# Patient Record
Sex: Female | Born: 1935
Health system: Southern US, Community
[De-identification: ages and names within clinical notes are randomized; demographics above are authoritative.]

## PROBLEM LIST (undated history)

## (undated) DIAGNOSIS — F32A Depression, unspecified: Secondary | ICD-10-CM

## (undated) DIAGNOSIS — M1990 Unspecified osteoarthritis, unspecified site: Secondary | ICD-10-CM

## (undated) DIAGNOSIS — F329 Major depressive disorder, single episode, unspecified: Secondary | ICD-10-CM

## (undated) DIAGNOSIS — J309 Allergic rhinitis, unspecified: Secondary | ICD-10-CM

## (undated) DIAGNOSIS — E785 Hyperlipidemia, unspecified: Secondary | ICD-10-CM

## (undated) DIAGNOSIS — N6019 Diffuse cystic mastopathy of unspecified breast: Secondary | ICD-10-CM

## (undated) DIAGNOSIS — S066X9A Traumatic subarachnoid hemorrhage with loss of consciousness of unspecified duration, initial encounter: Secondary | ICD-10-CM

## (undated) DIAGNOSIS — F0390 Unspecified dementia without behavioral disturbance: Secondary | ICD-10-CM

## (undated) DIAGNOSIS — I639 Cerebral infarction, unspecified: Secondary | ICD-10-CM

## (undated) DIAGNOSIS — E039 Hypothyroidism, unspecified: Secondary | ICD-10-CM

## (undated) DIAGNOSIS — R7301 Impaired fasting glucose: Secondary | ICD-10-CM

## (undated) DIAGNOSIS — S065X9A Traumatic subdural hemorrhage with loss of consciousness of unspecified duration, initial encounter: Secondary | ICD-10-CM

## (undated) DIAGNOSIS — I10 Essential (primary) hypertension: Secondary | ICD-10-CM

## (undated) DIAGNOSIS — F419 Anxiety disorder, unspecified: Secondary | ICD-10-CM

## (undated) DIAGNOSIS — K449 Diaphragmatic hernia without obstruction or gangrene: Secondary | ICD-10-CM

## (undated) HISTORY — DX: Anxiety disorder, unspecified: F41.9

## (undated) HISTORY — DX: Major depressive disorder, single episode, unspecified: F32.9

## (undated) HISTORY — DX: Unspecified osteoarthritis, unspecified site: M19.90

## (undated) HISTORY — PX: TONSILLECTOMY: SUR1361

## (undated) HISTORY — DX: Depression, unspecified: F32.A

## (undated) HISTORY — DX: Hyperlipidemia, unspecified: E78.5

## (undated) HISTORY — PX: BREAST SURGERY: SHX581

## (undated) HISTORY — DX: Cerebral infarction, unspecified: I63.9

## (undated) HISTORY — DX: Allergic rhinitis, unspecified: J30.9

## (undated) HISTORY — DX: Diffuse cystic mastopathy of unspecified breast: N60.19

## (undated) HISTORY — PX: ABDOMINAL HYSTERECTOMY: SHX81

## (undated) HISTORY — PX: EYE SURGERY: SHX253

## (undated) HISTORY — DX: Diaphragmatic hernia without obstruction or gangrene: K44.9

## (undated) HISTORY — DX: Impaired fasting glucose: R73.01

---

## 1997-06-12 ENCOUNTER — Other Ambulatory Visit: Admission: RE | Admit: 1997-06-12 | Discharge: 1997-06-12 | Payer: Self-pay | Admitting: Obstetrics and Gynecology

## 1998-11-27 ENCOUNTER — Ambulatory Visit (HOSPITAL_COMMUNITY): Admission: RE | Admit: 1998-11-27 | Discharge: 1998-11-27 | Payer: Self-pay | Admitting: *Deleted

## 1999-09-30 ENCOUNTER — Encounter: Admission: RE | Admit: 1999-09-30 | Discharge: 1999-09-30 | Payer: Self-pay | Admitting: Obstetrics and Gynecology

## 1999-09-30 ENCOUNTER — Encounter: Payer: Self-pay | Admitting: Obstetrics and Gynecology

## 2000-10-26 ENCOUNTER — Encounter: Admission: RE | Admit: 2000-10-26 | Discharge: 2000-10-26 | Payer: Self-pay | Admitting: Obstetrics and Gynecology

## 2000-10-26 ENCOUNTER — Encounter: Payer: Self-pay | Admitting: Obstetrics and Gynecology

## 2001-10-27 ENCOUNTER — Encounter: Payer: Self-pay | Admitting: Obstetrics and Gynecology

## 2001-10-27 ENCOUNTER — Encounter: Admission: RE | Admit: 2001-10-27 | Discharge: 2001-10-27 | Payer: Self-pay | Admitting: Obstetrics and Gynecology

## 2002-07-27 ENCOUNTER — Encounter: Payer: Self-pay | Admitting: Family Medicine

## 2002-07-27 ENCOUNTER — Ambulatory Visit (HOSPITAL_COMMUNITY): Admission: RE | Admit: 2002-07-27 | Discharge: 2002-07-27 | Payer: Self-pay | Admitting: Family Medicine

## 2002-10-31 ENCOUNTER — Encounter: Admission: RE | Admit: 2002-10-31 | Discharge: 2002-10-31 | Payer: Self-pay | Admitting: Obstetrics and Gynecology

## 2002-10-31 ENCOUNTER — Encounter: Payer: Self-pay | Admitting: Obstetrics and Gynecology

## 2003-11-01 ENCOUNTER — Ambulatory Visit (HOSPITAL_COMMUNITY): Admission: RE | Admit: 2003-11-01 | Discharge: 2003-11-01 | Payer: Self-pay | Admitting: Obstetrics and Gynecology

## 2004-10-08 ENCOUNTER — Ambulatory Visit (HOSPITAL_COMMUNITY): Admission: RE | Admit: 2004-10-08 | Discharge: 2004-10-08 | Payer: Self-pay | Admitting: Family Medicine

## 2004-11-12 ENCOUNTER — Ambulatory Visit (HOSPITAL_COMMUNITY): Admission: RE | Admit: 2004-11-12 | Discharge: 2004-11-12 | Payer: Self-pay | Admitting: Obstetrics and Gynecology

## 2005-11-19 ENCOUNTER — Ambulatory Visit (HOSPITAL_COMMUNITY): Admission: RE | Admit: 2005-11-19 | Discharge: 2005-11-19 | Payer: Self-pay | Admitting: Family Medicine

## 2005-12-18 ENCOUNTER — Ambulatory Visit (HOSPITAL_COMMUNITY): Admission: RE | Admit: 2005-12-18 | Discharge: 2005-12-18 | Payer: Self-pay | Admitting: Family Medicine

## 2006-12-28 ENCOUNTER — Ambulatory Visit (HOSPITAL_COMMUNITY): Admission: RE | Admit: 2006-12-28 | Discharge: 2006-12-28 | Payer: Self-pay | Admitting: Family Medicine

## 2007-12-20 ENCOUNTER — Ambulatory Visit (HOSPITAL_COMMUNITY): Admission: RE | Admit: 2007-12-20 | Discharge: 2007-12-20 | Payer: Self-pay | Admitting: Family Medicine

## 2009-02-05 ENCOUNTER — Ambulatory Visit (HOSPITAL_COMMUNITY): Admission: RE | Admit: 2009-02-05 | Discharge: 2009-02-05 | Payer: Self-pay | Admitting: Family Medicine

## 2009-07-08 ENCOUNTER — Ambulatory Visit (HOSPITAL_COMMUNITY): Admission: RE | Admit: 2009-07-08 | Discharge: 2009-07-08 | Payer: Self-pay | Admitting: Family Medicine

## 2009-07-14 ENCOUNTER — Emergency Department (HOSPITAL_COMMUNITY): Admission: EM | Admit: 2009-07-14 | Discharge: 2009-07-14 | Payer: Self-pay | Admitting: Emergency Medicine

## 2009-07-15 ENCOUNTER — Ambulatory Visit (HOSPITAL_COMMUNITY): Admission: RE | Admit: 2009-07-15 | Discharge: 2009-07-15 | Payer: Self-pay | Admitting: Family Medicine

## 2009-09-12 ENCOUNTER — Encounter (HOSPITAL_COMMUNITY): Admission: RE | Admit: 2009-09-12 | Discharge: 2009-10-12 | Payer: Self-pay | Admitting: Diagnostic Neuroimaging

## 2009-09-23 ENCOUNTER — Ambulatory Visit (HOSPITAL_COMMUNITY): Admission: RE | Admit: 2009-09-23 | Discharge: 2009-09-23 | Payer: Self-pay | Admitting: Diagnostic Neuroimaging

## 2009-10-15 ENCOUNTER — Encounter (HOSPITAL_COMMUNITY): Admission: RE | Admit: 2009-10-15 | Discharge: 2009-11-14 | Payer: Self-pay | Admitting: Diagnostic Neuroimaging

## 2010-01-02 ENCOUNTER — Ambulatory Visit: Payer: Self-pay | Admitting: Internal Medicine

## 2010-04-08 ENCOUNTER — Other Ambulatory Visit (HOSPITAL_COMMUNITY): Payer: Self-pay | Admitting: Family Medicine

## 2010-04-08 DIAGNOSIS — Z139 Encounter for screening, unspecified: Secondary | ICD-10-CM

## 2010-04-14 ENCOUNTER — Ambulatory Visit (HOSPITAL_COMMUNITY)
Admission: RE | Admit: 2010-04-14 | Discharge: 2010-04-14 | Disposition: A | Discharge status: Home / Self Care | Payer: MEDICARE | Source: Ambulatory Visit | Attending: Family Medicine | Admitting: Family Medicine

## 2010-04-14 DIAGNOSIS — Z139 Encounter for screening, unspecified: Secondary | ICD-10-CM

## 2010-04-14 DIAGNOSIS — Z1231 Encounter for screening mammogram for malignant neoplasm of breast: Secondary | ICD-10-CM | POA: Insufficient documentation

## 2010-07-01 ENCOUNTER — Ambulatory Visit (HOSPITAL_COMMUNITY): Admission: RE | Admit: 2010-07-01 | Payer: Self-pay | Source: Home / Self Care | Admitting: Family Medicine

## 2010-12-25 ENCOUNTER — Emergency Department (HOSPITAL_COMMUNITY): Payer: No Typology Code available for payment source

## 2010-12-25 ENCOUNTER — Emergency Department (HOSPITAL_COMMUNITY)
Admission: EM | Admit: 2010-12-25 | Discharge: 2010-12-25 | Disposition: A | Discharge status: Home / Self Care | Payer: No Typology Code available for payment source | Attending: Emergency Medicine | Admitting: Emergency Medicine

## 2010-12-25 ENCOUNTER — Encounter: Payer: Self-pay | Admitting: *Deleted

## 2010-12-25 DIAGNOSIS — R51 Headache: Secondary | ICD-10-CM | POA: Insufficient documentation

## 2010-12-25 DIAGNOSIS — S0003XA Contusion of scalp, initial encounter: Secondary | ICD-10-CM | POA: Insufficient documentation

## 2010-12-25 DIAGNOSIS — W19XXXA Unspecified fall, initial encounter: Secondary | ICD-10-CM | POA: Insufficient documentation

## 2010-12-25 DIAGNOSIS — S0990XA Unspecified injury of head, initial encounter: Secondary | ICD-10-CM | POA: Insufficient documentation

## 2010-12-25 DIAGNOSIS — S0083XA Contusion of other part of head, initial encounter: Secondary | ICD-10-CM

## 2010-12-25 DIAGNOSIS — M549 Dorsalgia, unspecified: Secondary | ICD-10-CM | POA: Insufficient documentation

## 2010-12-25 HISTORY — DX: Essential (primary) hypertension: I10

## 2010-12-25 MED ORDER — TETANUS-DIPHTH-ACELL PERTUSSIS 5-2.5-18.5 LF-MCG/0.5 IM SUSP
0.5000 mL | Freq: Once | INTRAMUSCULAR | Status: AC
  Administered 2010-12-25: 0.5 mL via INTRAMUSCULAR
  Filled 2010-12-25: qty 0.5

## 2010-12-25 NOTE — ED Provider Notes (Signed)
History     CSN: [id number] Arrival date & time: 12/25/2010  3:22 PM   First MD Initiated Contact with Patient 12/25/10 1526      Chief Complaint  Patient presents with  . Head Injury    (Consider location/radiation/quality/duration/timing/severity/associated sxs/prior treatment) Patient is a 77 y.o. female presenting with head injury. The history is provided by the patient.  Head Injury  The incident occurred less than 1 hour ago. The injury mechanism was a fall. There was no loss of consciousness. The volume of blood lost was minimal. The quality of the pain is described as dull. The pain is mild. The pain has been constant since the injury. Pertinent negatives include no numbness, no vomiting and no weakness. She has tried nothing for the symptoms.   patient states that she was sitting on a bench outside [name] and one of the sides gave out and she fell and hit her head on the concrete ground no loss of consciousness. She just has pain at the site of the swelling. She's a hematoma on her right for him. She has no other complaints, besides her chronic back pain. She's on no blood thinners. She has no numbness or weakness. No dizziness. No vision changes.  Past Medical History  Diagnosis Date  . Hypertension     Past Surgical History  Procedure Date  . Abdominal hysterectomy   . Breast surgery   . Tonsillectomy   . Eye surgery     History reviewed. No pertinent family history.  History  Substance Use Topics  . Smoking status: Never Smoker   . Smokeless tobacco: Not on file  . Alcohol Use: No    OB History    Grav Para Term Preterm Abortions TAB SAB Ect Mult Living                  Review of Systems  Constitutional: Negative for activity change and appetite change.  HENT: Negative for neck stiffness.   Eyes: Negative for pain.  Respiratory: Negative for chest tightness and shortness of breath.   Cardiovascular: Negative for chest pain and leg swelling.    Gastrointestinal: Negative for nausea, vomiting, abdominal pain and diarrhea.  Genitourinary: Negative for flank pain.  Musculoskeletal: Positive for back pain.  Skin: Positive for wound. Negative for rash.  Neurological: Positive for headaches. Negative for weakness and numbness.  Psychiatric/Behavioral: Negative for behavioral problems.    Allergies  Penicillins and Sulfa antibiotics  Home Medications  No current outpatient prescriptions on file.  BP 158/66  Pulse 72  Temp(Src) 98.3 F (36.8 C) (Oral)  Resp 18  SpO2 94%  Physical Exam  Nursing note and vitals reviewed. Constitutional: She is oriented to person, place, and time. She appears well-developed and well-nourished.  HENT:  Head: Normocephalic.        Patient has a 3 cm raised hematoma on her right for head above her right orbital rim. There is an abrasion. No deformity or tenderness in the bone.  Eyes: EOM are normal. Pupils are equal, round, and reactive to light.  Neck: Normal range of motion. Neck supple.  Cardiovascular: Normal rate, regular rhythm and normal heart sounds.   No murmur heard. Pulmonary/Chest: Effort normal and breath sounds normal. No respiratory distress. She has no wheezes. She has no rales.  Abdominal: Soft. Bowel sounds are normal. She exhibits no distension. There is no tenderness. There is no rebound and no guarding.  Musculoskeletal: Normal range of motion.  Neurological: She is  alert and oriented to person, place, and time. No cranial nerve deficit.  Skin: Skin is warm and dry.  Psychiatric: She has a normal mood and affect. Her speech is normal.    ED Course  Procedures (including critical care time)  Labs Reviewed - No data to display Ct Head Wo Contrast  12/25/2010  *RADIOLOGY REPORT*  Clinical Data: 77 year old female status post fall.  Hematoma.  CT HEAD WITHOUT CONTRAST  Technique:  Contiguous axial images were obtained from the base of the skull through the vertex without  contrast.  Comparison: Brain MRI 09/23/2009.  Head CT 07/14/2009.  Findings: Right lateral and supraorbital scalp hematoma measures up to 16 mm in thickness.  Underlying right frontal bone is intact. Right orbital roof is stable and intact. Visualized orbit soft tissues are within normal limits.  No acute osseous abnormality identified.  Visualized paranasal sinuses and mastoids are clear.  No ventriculomegaly. No midline shift, mass effect, or evidence of mass lesion.  No acute intracranial hemorrhage identified.  Patchy white matter hypodensity not significantly changed. No evidence of cortically based acute infarction identified.  No suspicious intracranial vascular hyperdensity.  IMPRESSION: 1.  Right forehead scalp hematoma without underlying fracture. 2.  Stable noncontrast CT appearance of the brain.  Original Report Authenticated By: Ulla Potash III, M.D.     1. Minor head injury   2. Traumatic hematoma of forehead       MDM  Fall with forehead hematoma. A loss of consciousness. CT negative for intracranial injury. Patient has no other injury apparent. She'll be discharged home she is not on blood thinners.        Juliet Rude. Rubin Payor, MD 12/25/10 1733

## 2010-12-25 NOTE — ED Notes (Signed)
Dr Rubin Payor at bedside  Examined pt and removed from c-collar and back board.  Pt denies pain other than in rt forehead.

## 2010-12-25 NOTE — ED Notes (Signed)
Pt states Was sitting outside on bench at [employer] and the bench broke causing the pt to be "thrown from the bench hitting her head on the concrete ground.

## 2011-05-15 ENCOUNTER — Encounter (HOSPITAL_COMMUNITY): Payer: Self-pay

## 2011-05-15 ENCOUNTER — Emergency Department (HOSPITAL_COMMUNITY): Payer: Medicare Other

## 2011-05-15 ENCOUNTER — Inpatient Hospital Stay (HOSPITAL_COMMUNITY)
Admission: EM | Admit: 2011-05-15 | Discharge: 2011-05-17 | DRG: 087 | Disposition: A | Discharge status: Home / Self Care | Payer: Medicare Other | Attending: General Surgery | Admitting: General Surgery

## 2011-05-15 DIAGNOSIS — S066X9A Traumatic subarachnoid hemorrhage with loss of consciousness of unspecified duration, initial encounter: Secondary | ICD-10-CM | POA: Diagnosis present

## 2011-05-15 DIAGNOSIS — W19XXXA Unspecified fall, initial encounter: Secondary | ICD-10-CM

## 2011-05-15 DIAGNOSIS — W1809XA Striking against other object with subsequent fall, initial encounter: Secondary | ICD-10-CM | POA: Diagnosis present

## 2011-05-15 DIAGNOSIS — S0101XA Laceration without foreign body of scalp, initial encounter: Secondary | ICD-10-CM

## 2011-05-15 DIAGNOSIS — I10 Essential (primary) hypertension: Secondary | ICD-10-CM | POA: Diagnosis present

## 2011-05-15 DIAGNOSIS — I609 Nontraumatic subarachnoid hemorrhage, unspecified: Secondary | ICD-10-CM

## 2011-05-15 DIAGNOSIS — Z79899 Other long term (current) drug therapy: Secondary | ICD-10-CM

## 2011-05-15 DIAGNOSIS — S066X0A Traumatic subarachnoid hemorrhage without loss of consciousness, initial encounter: Principal | ICD-10-CM | POA: Diagnosis present

## 2011-05-15 DIAGNOSIS — Y9289 Other specified places as the place of occurrence of the external cause: Secondary | ICD-10-CM

## 2011-05-15 DIAGNOSIS — S065X9A Traumatic subdural hemorrhage with loss of consciousness of unspecified duration, initial encounter: Secondary | ICD-10-CM

## 2011-05-15 DIAGNOSIS — S0100XA Unspecified open wound of scalp, initial encounter: Secondary | ICD-10-CM | POA: Diagnosis present

## 2011-05-15 DIAGNOSIS — W010XXA Fall on same level from slipping, tripping and stumbling without subsequent striking against object, initial encounter: Secondary | ICD-10-CM | POA: Diagnosis present

## 2011-05-15 DIAGNOSIS — S065X0A Traumatic subdural hemorrhage without loss of consciousness, initial encounter: Secondary | ICD-10-CM | POA: Diagnosis present

## 2011-05-15 DIAGNOSIS — F411 Generalized anxiety disorder: Secondary | ICD-10-CM | POA: Diagnosis present

## 2011-05-15 DIAGNOSIS — Y9301 Activity, walking, marching and hiking: Secondary | ICD-10-CM

## 2011-05-15 LAB — POCT I-STAT, CHEM 8
BUN: 17 mg/dL (ref 6–23)
Chloride: 96 mEq/L (ref 96–112)
Creatinine, Ser: 0.9 mg/dL (ref 0.50–1.10)
Glucose, Bld: 128 mg/dL — ABNORMAL HIGH (ref 70–99)
Potassium: 3.1 mEq/L — ABNORMAL LOW (ref 3.5–5.1)
Sodium: 133 mEq/L — ABNORMAL LOW (ref 135–145)

## 2011-05-15 LAB — POCT I-STAT TROPONIN I

## 2011-05-15 LAB — PROTIME-INR
INR: 0.89 (ref 0.00–1.49)
Prothrombin Time: 12.2 seconds (ref 11.6–15.2)

## 2011-05-15 MED ORDER — SODIUM CHLORIDE 0.9 % IV SOLN: INTRAVENOUS | Status: DC

## 2011-05-15 NOTE — ED Notes (Signed)
Pt was at [employer] walking to her car, got dizzy and fell backwards hitting her head on the ground.  Unknown if loc, pt has hematoma and lac to back of head with dressing in place per ems.   Pt denies pain of any kind, is fully immob per ems d/t fall

## 2011-05-15 NOTE — ED Provider Notes (Signed)
History     CSN: [id number]  Arrival date & time 05/15/11  5784   First MD Initiated Contact with Patient 05/15/11 2005      Chief Complaint  Patient presents with  . Fall     HPI Pt was seen at 2000.  Per pt, c/o sudden onset and resolution of one episode of slip and fall PTA.  Pt states she "just ran into the store quick to get some milk" and did not take her walker to walk as she is supposed to.  Pt states she was almost back to her car when she slipped and fell backwards, hitting her head on the ground.  Denies LOC.  No AMS since fall.  Denies prodromal symptoms before fall.  Denies CP/SOB, no abd pain, no neck or back pain, no N/V/D, no visual changes, no focal motor weakness, no tingling/numbness in extremities.    Past Medical History  Diagnosis Date  . Hypertension     Past Surgical History  Procedure Date  . Abdominal hysterectomy   . Breast surgery   . Tonsillectomy   . Eye surgery     History  Substance Use Topics  . Smoking status: Never Smoker   . Smokeless tobacco: Not on file  . Alcohol Use: No    Review of Systems ROS: Statement: All systems negative except as marked or noted in the HPI; Constitutional: Negative for fever and chills. ; ; Eyes: Negative for eye pain, redness and discharge. ; ; ENMT: Negative for ear pain, hoarseness, nasal congestion, sinus pressure and sore throat. ; ; Cardiovascular: Negative for chest pain, palpitations, diaphoresis, dyspnea and peripheral edema. ; ; Respiratory: Negative for cough, wheezing and stridor. ; ; Gastrointestinal: Negative for nausea, vomiting, diarrhea, abdominal pain, blood in stool, hematemesis, jaundice and rectal bleeding. . ; ; Genitourinary: Negative for dysuria, flank pain and hematuria. ; ; Musculoskeletal: Negative for back pain and neck pain. Negative for swelling and trauma.; ; Skin: Negative for pruritus, rash, abrasions, blisters, bruising and skin lesion.; ; Neuro: Negative for headache,  lightheadedness and neck stiffness. Negative for weakness, altered level of consciousness , altered mental status, extremity weakness, paresthesias, involuntary movement, seizure and syncope.     Allergies  Penicillins and Sulfa antibiotics  Home Medications   Current Outpatient Rx  Name Route Sig Dispense Refill  . ACETAMINOPHEN 500 MG PO TABS Oral Take 1,000 mg by mouth as needed. For pain    . ALPRAZOLAM 0.5 MG PO TABS Oral Take 0.5 mg by mouth 2 (two) times daily as needed. For anxiety     . METOPROLOL SUCCINATE ER 100 MG PO TB24 Oral Take 100 mg by mouth every morning.      . ADULT MULTIVITAMIN W/MINERALS CH Oral Take 1 tablet by mouth every morning.    Marland Kitchen NORTRIPTYLINE HCL 50 MG PO CAPS Oral Take 50 mg by mouth at bedtime.      . SERTRALINE HCL 100 MG PO TABS Oral Take 100 mg by mouth every morning.        BP 173/80  Pulse 73  Temp(Src) 98.4 F (36.9 C) (Oral)  Resp 18  SpO2 98%  Physical Exam 2005: Physical examination: Vital signs and O2 SAT: Reviewed; Constitutional: Well developed, Well nourished, Well hydrated, In no acute distress; Head and Face: Normocephalic, +1cm linear lac with surrounding hematoma and abrasion to right posterior parietal/occipital scalp; Eyes: EOMI, PERRL, No scleral icterus; ENMT: Mouth and pharynx normal, Mucous membranes moist; Neck: Immobilized in C-collar,  Trachea midline; Spine: Immobilized on spineboard, No midline CS, TS, LS tenderness.; Cardiovascular: Regular rate and rhythm, No murmur, rub, or gallop; Respiratory: Breath sounds clear & equal bilaterally, No rales, rhonchi, wheezes, or rub, Normal respiratory effort/excursion; Chest: Nontender, No deformity, Movement normal, No crepitus; Abdomen: Soft, Nontender, Nondistended, Normal bowel sounds; Genitourinary: No CVA tenderness; Extremities: No deformity, Full range of motion, Neurovascularly intact, Pulses normal, No tenderness, No edema, Pelvis stable; Neuro: AA&Ox3, GCS 15.  Major CN grossly  intact. Speech clear, no facial droop. No gross focal motor or sensory deficits in extremities.; Skin: Color normal, Warm, Dry, no rash.    ED Course  Procedures   LACERATION REPAIR Performed by: Laray Anger Authorized by: Laray Anger Consent: Verbal consent obtained. Risks and benefits: risks, benefits and alternatives were discussed Consent given by: patient Patient identity confirmed: provided demographic data Prepped and Draped in normal sterile fashion Wound explored Laceration Location: right posterior parietal scalp Laceration Length: 1cm No Foreign Bodies seen or palpated Anesthesia: none Irrigation method: NS/4x4 Amount of cleaning: standard Skin closure: 2 staples Patient tolerance: Patient tolerated the procedure well with no immediate complications.    MDM  MDM Reviewed: nursing note and vitals Interpretation: CT scan Consults: trauma   Ct Head Wo Contrast 05/15/2011  *RADIOLOGY REPORT*  Clinical Data:  Status post fall; hit back of head on pavement. Altered level of consciousness.  Concern for neck injury.  CT HEAD WITHOUT CONTRAST AND CT CERVICAL SPINE WITHOUT CONTRAST  Technique:  Multidetector CT imaging of the head and cervical spine was performed following the standard protocol without intravenous contrast.  Multiplanar CT image reconstructions of the cervical spine were also generated.  Comparison: CT of the head performed 12/25/2010, and MRI of the brain performed 09/23/2009  CT HEAD  Findings:  There is suspicion of a small amount of subarachnoid blood along the right temporal lobe.  In addition, there appears to be a small right-sided subdural bleed overlying the right temporal lobe adjacent to the suspected subarachnoid hemorrhage, measuring 4 mm in maximal thickness.  There is no additional evidence for traumatic intracranial injury.  No mass lesions are identified; there is no evidence of acute infarct.  Prominence of the ventricles and sulci  reflects mild cortical volume loss.  Periventricular and subcortical white matter change likely reflects small vessel ischemic microangiopathy.  Mild cerebellar atrophy is noted.  The brainstem and fourth ventricle are within normal limits.  The basal ganglia are unremarkable in appearance.  The cerebral hemispheres demonstrate grossly normal gray-white differentiation. No mass effect or midline shift is seen.  There is no evidence of fracture; visualized osseous structures are unremarkable in appearance.  The visualized portions of the orbits are within normal limits.  The paranasal sinuses and mastoid air cells are well-aerated.  A prominent scalp hematoma and laceration are noted overlying the high right parietal calvarium, with associated soft tissue air.  IMPRESSION:  1.  Small 4 mm right-sided subdural bleed overlying the right temporal lobe. 2.  Suspect small amount of subarachnoid blood along the right temporal lobe. 3.  No additional evidence for traumatic intracranial injury; no evidence of fracture. 4.  Prominent scalp hematoma and laceration overlying the high right parietal calvarium, with associated soft tissue air. 5.  Mild cortical volume loss and scattered small vessel ischemic microangiopathy.  CT CERVICAL SPINE  Findings: There is no evidence of acute fracture or subluxation. There is grade 1 anterolisthesis of C4 on C5, and multilevel disc space narrowing along the  cervical spine, with anterior and posterior disc complexes.  Degenerative change is noted about the dens.  Facet disease is noted along the cervical spine, with fusion of the posterior elements of C3 and C4.  Mild kyphosis is noted at the lower cervical spine.  Vertebral bodies demonstrate normal height.  Prevertebral soft tissues are within normal limits.  The thyroid gland is unremarkable in appearance.  The visualized lung apices are clear.  No significant soft tissue abnormalities are seen.  IMPRESSION:  1.  No evidence of acute  fracture or subluxation along the cervical spine. 2.  Degenerative change noted along the cervical spine, with mild kyphosis seen.  Critical Value/emergent results were called by telephone at the time of interpretation on 05/15/2011  at 09:27 p.m.  to  Dr. Samuel Jester, who verbally acknowledged these results.  Original Report Authenticated By: Tonia Ghent, M.D.   Ct Cervical Spine Wo Contrast 05/15/2011  *RADIOLOGY REPORT*  Clinical Data:  Status post fall; hit back of head on pavement. Altered level of consciousness.  Concern for neck injury.  CT HEAD WITHOUT CONTRAST AND CT CERVICAL SPINE WITHOUT CONTRAST  Technique:  Multidetector CT imaging of the head and cervical spine was performed following the standard protocol without intravenous contrast.  Multiplanar CT image reconstructions of the cervical spine were also generated.  Comparison: CT of the head performed 12/25/2010, and MRI of the brain performed 09/23/2009  CT HEAD  Findings:  There is suspicion of a small amount of subarachnoid blood along the right temporal lobe.  In addition, there appears to be a small right-sided subdural bleed overlying the right temporal lobe adjacent to the suspected subarachnoid hemorrhage, measuring 4 mm in maximal thickness.  There is no additional evidence for traumatic intracranial injury.  No mass lesions are identified; there is no evidence of acute infarct.  Prominence of the ventricles and sulci reflects mild cortical volume loss.  Periventricular and subcortical white matter change likely reflects small vessel ischemic microangiopathy.  Mild cerebellar atrophy is noted.  The brainstem and fourth ventricle are within normal limits.  The basal ganglia are unremarkable in appearance.  The cerebral hemispheres demonstrate grossly normal gray-white differentiation. No mass effect or midline shift is seen.  There is no evidence of fracture; visualized osseous structures are unremarkable in appearance.  The  visualized portions of the orbits are within normal limits.  The paranasal sinuses and mastoid air cells are well-aerated.  A prominent scalp hematoma and laceration are noted overlying the high right parietal calvarium, with associated soft tissue air.  IMPRESSION:  1.  Small 4 mm right-sided subdural bleed overlying the right temporal lobe. 2.  Suspect small amount of subarachnoid blood along the right temporal lobe. 3.  No additional evidence for traumatic intracranial injury; no evidence of fracture. 4.  Prominent scalp hematoma and laceration overlying the high right parietal calvarium, with associated soft tissue air. 5.  Mild cortical volume loss and scattered small vessel ischemic microangiopathy.  CT CERVICAL SPINE  Findings: There is no evidence of acute fracture or subluxation. There is grade 1 anterolisthesis of C4 on C5, and multilevel disc space narrowing along the cervical spine, with anterior and posterior disc complexes.  Degenerative change is noted about the dens.  Facet disease is noted along the cervical spine, with fusion of the posterior elements of C3 and C4.  Mild kyphosis is noted at the lower cervical spine.  Vertebral bodies demonstrate normal height.  Prevertebral soft tissues are within normal limits.  The thyroid gland is unremarkable in appearance.  The visualized lung apices are clear.  No significant soft tissue abnormalities are seen.  IMPRESSION:  1.  No evidence of acute fracture or subluxation along the cervical spine. 2.  Degenerative change noted along the cervical spine, with mild kyphosis seen.  Critical Value/emergent results were called by telephone at the time of interpretation on 05/15/2011  at 09:27 p.m.  to  Dr. Samuel Jester, who verbally acknowledged these results.  Original Report Authenticated By: Tonia Ghent, M.D.     2145:  Dx testing d/w pt and family.  Questions answered.  Verb understanding, agreeable to transfer/admit to [facility].  Will draw PTT/INR,  I-stat chem for baseline labs.  Pt continues with neuro exam intact.  Pt actually talking on her cellphone to family and friends at this time and during most of her ED stay.  States she just "feels a little dizzy when I turn my head."  No midline CS tenderness to palp, FROM CS without pain; c-collar removed.  T/C to Trauma Dr. Janee Morn, case discussed, including:  HPI, pertinent PM/SHx, VS/PE, dx testing, ED course and treatment:  Agreeable to accept transfer to admit to [facility], requests to obtain 3100 bed to Trauma MD service.           Laray Anger, DO 05/17/11 1515

## 2011-05-16 DIAGNOSIS — S0101XA Laceration without foreign body of scalp, initial encounter: Secondary | ICD-10-CM | POA: Diagnosis present

## 2011-05-16 DIAGNOSIS — S065XAA Traumatic subdural hemorrhage with loss of consciousness status unknown, initial encounter: Secondary | ICD-10-CM

## 2011-05-16 DIAGNOSIS — S066X9A Traumatic subarachnoid hemorrhage with loss of consciousness of unspecified duration, initial encounter: Secondary | ICD-10-CM | POA: Diagnosis present

## 2011-05-16 DIAGNOSIS — S065X9A Traumatic subdural hemorrhage with loss of consciousness of unspecified duration, initial encounter: Secondary | ICD-10-CM

## 2011-05-16 DIAGNOSIS — I10 Essential (primary) hypertension: Secondary | ICD-10-CM | POA: Insufficient documentation

## 2011-05-16 DIAGNOSIS — S066XAA Traumatic subarachnoid hemorrhage with loss of consciousness status unknown, initial encounter: Secondary | ICD-10-CM

## 2011-05-16 DIAGNOSIS — F419 Anxiety disorder, unspecified: Secondary | ICD-10-CM | POA: Insufficient documentation

## 2011-05-16 HISTORY — DX: Traumatic subdural hemorrhage with loss of consciousness of unspecified duration, initial encounter: S06.5X9A

## 2011-05-16 HISTORY — DX: Traumatic subarachnoid hemorrhage with loss of consciousness status unknown, initial encounter: S06.6XAA

## 2011-05-16 HISTORY — DX: Traumatic subarachnoid hemorrhage with loss of consciousness of unspecified duration, initial encounter: S06.6X9A

## 2011-05-16 HISTORY — DX: Traumatic subdural hemorrhage with loss of consciousness status unknown, initial encounter: S06.5XAA

## 2011-05-16 LAB — CBC
HCT: 36.9 % (ref 36.0–46.0)
Hemoglobin: 12.4 g/dL (ref 12.0–15.0)
MCH: 28.7 pg (ref 26.0–34.0)
MCV: 85.4 fL (ref 78.0–100.0)
Platelets: 211 10*3/uL (ref 150–400)
RBC: 4.32 MIL/uL (ref 3.87–5.11)
WBC: 12.3 10*3/uL — ABNORMAL HIGH (ref 4.0–10.5)

## 2011-05-16 LAB — BASIC METABOLIC PANEL
BUN: 10 mg/dL (ref 6–23)
CO2: 29 mEq/L (ref 19–32)
Chloride: 96 mEq/L (ref 96–112)
Creatinine, Ser: 0.59 mg/dL (ref 0.50–1.10)
Glucose, Bld: 113 mg/dL — ABNORMAL HIGH (ref 70–99)

## 2011-05-16 LAB — PROTIME-INR: Prothrombin Time: 12.1 seconds (ref 11.6–15.2)

## 2011-05-16 MED ORDER — MORPHINE SULFATE 2 MG/ML IJ SOLN
2.0000 mg | INTRAMUSCULAR | Status: DC | PRN
  Administered 2011-05-16: 2 mg via INTRAVENOUS
  Administered 2011-05-16: 4 mg via INTRAVENOUS
  Filled 2011-05-16: qty 2

## 2011-05-16 MED ORDER — MORPHINE SULFATE 4 MG/ML IJ SOLN: 3.0000 mg | INTRAMUSCULAR | Status: DC | PRN

## 2011-05-16 MED ORDER — POTASSIUM CHLORIDE IN NACL 20-0.9 MEQ/L-% IV SOLN
INTRAVENOUS | Status: DC
  Administered 2011-05-16: 02:00:00 via INTRAVENOUS
  Filled 2011-05-16 (×3): qty 1000

## 2011-05-16 MED ORDER — OXYCODONE HCL 5 MG PO TABS
5.0000 mg | ORAL_TABLET | ORAL | Status: DC | PRN
  Administered 2011-05-16: 5 mg via ORAL
  Filled 2011-05-16: qty 1

## 2011-05-16 MED ORDER — ACETAMINOPHEN 325 MG PO TABS: 650.0000 mg | ORAL_TABLET | ORAL | Status: DC | PRN

## 2011-05-16 MED ORDER — PANTOPRAZOLE SODIUM 40 MG IV SOLR
40.0000 mg | Freq: Every day | INTRAVENOUS | Status: DC
  Filled 2011-05-16: qty 40

## 2011-05-16 MED ORDER — ONDANSETRON HCL 4 MG/2ML IJ SOLN
4.0000 mg | Freq: Four times a day (QID) | INTRAMUSCULAR | Status: DC | PRN
  Administered 2011-05-16: 4 mg via INTRAVENOUS
  Filled 2011-05-16: qty 2

## 2011-05-16 MED ORDER — NORTRIPTYLINE HCL 25 MG PO CAPS
50.0000 mg | ORAL_CAPSULE | Freq: Every day | ORAL | Status: DC
  Administered 2011-05-16: 50 mg via ORAL
  Filled 2011-05-16 (×2): qty 2

## 2011-05-16 MED ORDER — ALPRAZOLAM 0.5 MG PO TABS
0.5000 mg | ORAL_TABLET | Freq: Two times a day (BID) | ORAL | Status: DC | PRN
  Administered 2011-05-16: 0.5 mg via ORAL
  Filled 2011-05-16: qty 1

## 2011-05-16 MED ORDER — PANTOPRAZOLE SODIUM 40 MG PO TBEC: 40.0000 mg | DELAYED_RELEASE_TABLET | Freq: Every day | ORAL | Status: DC

## 2011-05-16 MED ORDER — MORPHINE SULFATE 2 MG/ML IJ SOLN
INTRAMUSCULAR | Status: AC
  Administered 2011-05-16: 2 mg via INTRAVENOUS
  Filled 2011-05-16: qty 1

## 2011-05-16 MED ORDER — METOPROLOL SUCCINATE ER 100 MG PO TB24
100.0000 mg | ORAL_TABLET | Freq: Every day | ORAL | Status: DC
  Administered 2011-05-16: 100 mg via ORAL
  Filled 2011-05-16 (×2): qty 1

## 2011-05-16 MED ORDER — SERTRALINE HCL 100 MG PO TABS
100.0000 mg | ORAL_TABLET | Freq: Every day | ORAL | Status: DC
  Administered 2011-05-16: 100 mg via ORAL
  Filled 2011-05-16 (×2): qty 1

## 2011-05-16 MED ORDER — ONDANSETRON HCL 4 MG PO TABS
4.0000 mg | ORAL_TABLET | Freq: Four times a day (QID) | ORAL | Status: DC | PRN
  Administered 2011-05-16: 4 mg via ORAL
  Filled 2011-05-16: qty 1

## 2011-05-16 NOTE — Progress Notes (Signed)
Patient ID: [facility] [name], female   DOB: February 15, 1932, 78 y.o.   MRN: [id number]   LOS: 1 day   Subjective: No c/o. Had a HA last night but resolved with 4mg  morphine. 1 e/o emesis after the morphine but no N/V otherwise. Tolerated clears for breakfast.  Objective: Vital signs in last 24 hours: Temp:  [97.5 F (36.4 C)-98.4 F (36.9 C)] 98 F (36.7 C) (03/16 0700) Pulse Rate:  [63-89] 69  (03/16 0700) Resp:  [14-22] 21  (03/16 0700) BP: (139-179)/(59-91) 156/88 mmHg (03/16 0700) SpO2:  [93 %-100 %] 93 % (03/16 0700) Weight:  [57.8 kg (127 lb 6.8 oz)] 57.8 kg (127 lb 6.8 oz) (03/15 2330) Last BM Date: 05/15/11  Lab Results:  CBC  Basename 05/16/11 0615 05/15/11 2247  WBC 12.3* --  HGB 12.4 13.9  HCT 36.9 41.0  PLT 211 --   BMET  Basename 05/16/11 0615 05/15/11 2247  NA 133* 133*  K 3.6 3.1*  CL 96 96  CO2 29 --  GLUCOSE 113* 128*  BUN 10 17  CREATININE 0.59 0.90  CALCIUM 8.8 --    General appearance: alert and no distress Eyes: negative findings: pupils equal, round, reactive to light  Resp: clear to auscultation bilaterally Cardio: regular rate and rhythm GI: normal findings: bowel sounds normal and soft, non-tender Neurologic: Grossly normal, Ox3  Assessment/Plan: Fall TBI w/SDH, [facility] -- Repeat CT tomorrow. Dr. Newell Coral following. Scalp laceration s/p repair -- Local care HTN -- Home meds Anxiety d/o -- Home meds FEN -- Advance diet, PT/OT, oral pain meds VTE -- SCD's Dispo -- Continue ICU with f/u CT tomorrow.   Freeman Caldron, PA-C Pager: [phone] General Trauma PA Pager: [phone]   05/16/2011

## 2011-05-16 NOTE — Consult Note (Signed)
Reason for Consult: Head injury Referring Physician: Dr. Alford Ebony [name] is an 78 y.o. female.  HPI: Patient fell in the parking lot of a store, falling backwards striking the right side of the back of her head. She denies loss of consciousness, and was taken to the [facility] emergency room for evaluation. CT was obtained and revealed minimal traumatic subdural and subarachnoid hemorrhage over the right temporal lobe. Patient was transferred to the trauma surgical service, and admitted to the neurosurgical ICU. She did have a small right occipital laceration stapled it the emergency room.  Symptomatically she did have some mild frontal headache initially, and did have some nausea when given morphine IV. Currently she denies complaints.  Patient reports that she's had a tendency to fall for many years, and typically uses a walker, and did use initially when she was shopping yesterday but than for her last store she didn't use the walker and when she was walking back to her car just before she got to the car she fell backwards striking the back of her head.  Past Medical History:  Past Medical History  Diagnosis Date  . Hypertension     Past Surgical History:  Past Surgical History  Procedure Date  . Abdominal hysterectomy   . Breast surgery   . Tonsillectomy   . Eye surgery     Family History: No family history on file.  Social History:  reports that she has never smoked. She does not have any smokeless tobacco history on file. She reports that she does not drink alcohol or use illicit drugs. Patient is widowed as of November 2012.   Allergies:  Allergies  Allergen Reactions  . Penicillins Diarrhea  . Sulfa Antibiotics Diarrhea    Medications: I have reviewed the patient's current medications.  Review of systems: Notable for those described in her history of present illness and past medical history but otherwise unremarkable.  Results for  orders placed during the hospital encounter of 05/15/11 (from the past 48 hour(s))  PROTIME-INR     Status: Normal   Collection Time   05/15/11 10:01 PM      Component Value Range Comment   Prothrombin Time 12.2  11.6 - 15.2 (seconds)    INR 0.89  0.00 - 1.49    APTT     Status: Normal   Collection Time   05/15/11 10:01 PM      Component Value Range Comment   aPTT 28  24 - 37 (seconds)   POCT I-STAT TROPONIN I     Status: Normal   Collection Time   05/15/11 10:14 PM      Component Value Range Comment   Troponin i, poc 0.01  0.00 - 0.08 (ng/mL)    Comment 3            POCT I-STAT, CHEM 8     Status: Abnormal   Collection Time   05/15/11 10:47 PM      Component Value Range Comment   Sodium 133 (*) 135 - 145 (mEq/L)    Potassium 3.1 (*) 3.5 - 5.1 (mEq/L)    Chloride 96  96 - 112 (mEq/L)    BUN 17  6 - 23 (mg/dL)    Creatinine, Ser 1.61  0.50 - 1.10 (mg/dL)    Glucose, Bld 096 (*) 70 - 99 (mg/dL)    Calcium, Ion 0.45  1.12 - 1.32 (mmol/L)    TCO2 30  0 - 100 (mmol/L)  Hemoglobin 13.9  12.0 - 15.0 (g/dL)    HCT 04.5  40.9 - 81.1 (%)   MRSA PCR SCREENING     Status: Normal   Collection Time   05/15/11 11:28 PM      Component Value Range Comment   MRSA by PCR NEGATIVE  NEGATIVE    CBC     Status: Abnormal   Collection Time   05/16/11  6:15 AM      Component Value Range Comment   WBC 12.3 (*) 4.0 - 10.5 (K/uL)    RBC 4.32  3.87 - 5.11 (MIL/uL)    Hemoglobin 12.4  12.0 - 15.0 (g/dL)    HCT 91.4  78.2 - 95.6 (%)    MCV 85.4  78.0 - 100.0 (fL)    MCH 28.7  26.0 - 34.0 (pg)    MCHC 33.6  30.0 - 36.0 (g/dL)    RDW 21.3 (*) 08.6 - 15.5 (%)    Platelets 211  150 - 400 (K/uL)   BASIC METABOLIC PANEL     Status: Abnormal   Collection Time   05/16/11  6:15 AM      Component Value Range Comment   Sodium 133 (*) 135 - 145 (mEq/L)    Potassium 3.6  3.5 - 5.1 (mEq/L)    Chloride 96  96 - 112 (mEq/L)    CO2 29  19 - 32 (mEq/L)    Glucose, Bld 113 (*) 70 - 99 (mg/dL)    BUN 10  6 - 23  (mg/dL)    Creatinine, Ser 5.78  0.50 - 1.10 (mg/dL)    Calcium 8.8  8.4 - 10.5 (mg/dL)    GFR calc non Af Amer 86 (*) >90 (mL/min)    GFR calc Af Amer >90  >90 (mL/min)   PROTIME-INR     Status: Normal   Collection Time   05/16/11  6:15 AM      Component Value Range Comment   Prothrombin Time 12.1  11.6 - 15.2 (seconds)    INR 0.88  0.00 - 1.49      Ct Head Wo Contrast  05/15/2011  *RADIOLOGY REPORT*  Clinical Data:  Status post fall; hit back of head on pavement. Altered level of consciousness.  Concern for neck injury.  CT HEAD WITHOUT CONTRAST AND CT CERVICAL SPINE WITHOUT CONTRAST  Technique:  Multidetector CT imaging of the head and cervical spine was performed following the standard protocol without intravenous contrast.  Multiplanar CT image reconstructions of the cervical spine were also generated.  Comparison: CT of the head performed 12/25/2010, and MRI of the brain performed 09/23/2009  CT HEAD  Findings:  There is suspicion of a small amount of subarachnoid blood along the right temporal lobe.  In addition, there appears to be a small right-sided subdural bleed overlying the right temporal lobe adjacent to the suspected subarachnoid hemorrhage, measuring 4 mm in maximal thickness.  There is no additional evidence for traumatic intracranial injury.  No mass lesions are identified; there is no evidence of acute infarct.  Prominence of the ventricles and sulci reflects mild cortical volume loss.  Periventricular and subcortical white matter change likely reflects small vessel ischemic microangiopathy.  Mild cerebellar atrophy is noted.  The brainstem and fourth ventricle are within normal limits.  The basal ganglia are unremarkable in appearance.  The cerebral hemispheres demonstrate grossly normal gray-white differentiation. No mass effect or midline shift is seen.  There is no evidence of fracture; visualized osseous structures are unremarkable  in appearance.  The visualized portions of the  orbits are within normal limits.  The paranasal sinuses and mastoid air cells are well-aerated.  A prominent scalp hematoma and laceration are noted overlying the high right parietal calvarium, with associated soft tissue air.  IMPRESSION:  1.  Small 4 mm right-sided subdural bleed overlying the right temporal lobe. 2.  Suspect small amount of subarachnoid blood along the right temporal lobe. 3.  No additional evidence for traumatic intracranial injury; no evidence of fracture. 4.  Prominent scalp hematoma and laceration overlying the high right parietal calvarium, with associated soft tissue air. 5.  Mild cortical volume loss and scattered small vessel ischemic microangiopathy.  CT CERVICAL SPINE  Findings: There is no evidence of acute fracture or subluxation. There is grade 1 anterolisthesis of C4 on C5, and multilevel disc space narrowing along the cervical spine, with anterior and posterior disc complexes.  Degenerative change is noted about the dens.  Facet disease is noted along the cervical spine, with fusion of the posterior elements of C3 and C4.  Mild kyphosis is noted at the lower cervical spine.  Vertebral bodies demonstrate normal height.  Prevertebral soft tissues are within normal limits.  The thyroid gland is unremarkable in appearance.  The visualized lung apices are clear.  No significant soft tissue abnormalities are seen.  IMPRESSION:  1.  No evidence of acute fracture or subluxation along the cervical spine. 2.  Degenerative change noted along the cervical spine, with mild kyphosis seen.  Critical Value/emergent results were called by telephone at the time of interpretation on 05/15/2011  at 09:27 p.m.  to  Dr. Samuel Jester, who verbally acknowledged these results.  Original Report Authenticated By: Tonia Ghent, M.D.   Ct Cervical Spine Wo Contrast  05/15/2011  *RADIOLOGY REPORT*  Clinical Data:  Status post fall; hit back of head on pavement. Altered level of consciousness.  Concern  for neck injury.  CT HEAD WITHOUT CONTRAST AND CT CERVICAL SPINE WITHOUT CONTRAST  Technique:  Multidetector CT imaging of the head and cervical spine was performed following the standard protocol without intravenous contrast.  Multiplanar CT image reconstructions of the cervical spine were also generated.  Comparison: CT of the head performed 12/25/2010, and MRI of the brain performed 09/23/2009  CT HEAD  Findings:  There is suspicion of a small amount of subarachnoid blood along the right temporal lobe.  In addition, there appears to be a small right-sided subdural bleed overlying the right temporal lobe adjacent to the suspected subarachnoid hemorrhage, measuring 4 mm in maximal thickness.  There is no additional evidence for traumatic intracranial injury.  No mass lesions are identified; there is no evidence of acute infarct.  Prominence of the ventricles and sulci reflects mild cortical volume loss.  Periventricular and subcortical white matter change likely reflects small vessel ischemic microangiopathy.  Mild cerebellar atrophy is noted.  The brainstem and fourth ventricle are within normal limits.  The basal ganglia are unremarkable in appearance.  The cerebral hemispheres demonstrate grossly normal gray-white differentiation. No mass effect or midline shift is seen.  There is no evidence of fracture; visualized osseous structures are unremarkable in appearance.  The visualized portions of the orbits are within normal limits.  The paranasal sinuses and mastoid air cells are well-aerated.  A prominent scalp hematoma and laceration are noted overlying the high right parietal calvarium, with associated soft tissue air.  IMPRESSION:  1.  Small 4 mm right-sided subdural bleed overlying the right temporal lobe.  2.  Suspect small amount of subarachnoid blood along the right temporal lobe. 3.  No additional evidence for traumatic intracranial injury; no evidence of fracture. 4.  Prominent scalp hematoma and  laceration overlying the high right parietal calvarium, with associated soft tissue air. 5.  Mild cortical volume loss and scattered small vessel ischemic microangiopathy.  CT CERVICAL SPINE  Findings: There is no evidence of acute fracture or subluxation. There is grade 1 anterolisthesis of C4 on C5, and multilevel disc space narrowing along the cervical spine, with anterior and posterior disc complexes.  Degenerative change is noted about the dens.  Facet disease is noted along the cervical spine, with fusion of the posterior elements of C3 and C4.  Mild kyphosis is noted at the lower cervical spine.  Vertebral bodies demonstrate normal height.  Prevertebral soft tissues are within normal limits.  The thyroid gland is unremarkable in appearance.  The visualized lung apices are clear.  No significant soft tissue abnormalities are seen.  IMPRESSION:  1.  No evidence of acute fracture or subluxation along the cervical spine. 2.  Degenerative change noted along the cervical spine, with mild kyphosis seen.  Critical Value/emergent results were called by telephone at the time of interpretation on 05/15/2011  at 09:27 p.m.  to  Dr. Samuel Jester, who verbally acknowledged these results.  Original Report Authenticated By: Tonia Ghent, M.D.    Physical exam: Blood pressure 156/88, pulse 69, temperature 98 F (36.7 C), temperature source Oral, resp. rate 21, height 5\' 4"  (1.626 m), weight 57.8 kg (127 lb 6.8 oz), SpO2 93.00%. Lungs are clear to auscultation , the patient has symmetrical respiratory excursion. Heart has a regular rate and rhythm normal S1 and S2 no murmur.   Abdomen is soft nontender nondistended bowel sounds are present. Extremity examination shows no clubbing cyanosis or edema. Patient is awake, alert, oriented to name, [place], in March 2015. She follows commands, her speech is fluent. Pupils are equal round and reactive to light, and about 2.5 mm bilaterally. Extraocular movements are  intact. Facial movement is symmetrical. Hearing is present bilaterally. Palatal movement is symmetrical. His shoulder shrug is symmetrical. Tongue is midline. Motor examination shows 5 over 5 strength, there is no drift of the upper extremities. Sensation is intact to pinprick throughout. Reflexes are symmetrical. Gait and stance are not tested due to the nature of her condition.   Assessment/Plan: 78 year old woman with a fall last evening, no loss of consciousness, Glasgow Coma Scale 15 over 15. CT showed minimal traumatic subarachnoid and subdural blood over the right temporal lobe without mass effect or shift. Neurologically intact at this time.  I discussed the case with Dr. Violeta Gelinas her admitting trauma surgeon. I feel this CT of her brain to be repeated in the morning, and if it looks good and she is stable from a medical and neurologic perspective she should be able to be discharged to home with followup later this week with her primary physician in [place].  Hewitt Shorts, MD 05/16/2011, 9:05 AM

## 2011-05-16 NOTE — Progress Notes (Signed)
I have seen and examined the patient and agree with the assessment and plans.  Analena Gama A. Alliene Klugh  MD, FACS  

## 2011-05-16 NOTE — H&P (Signed)
Ebony [name] is an 78 y.o. female.   Chief Complaint: Head trauma HPI: Patient was out shopping. She usually uses a walker. She did not bring her walker with her. She fell walking from the store to her car. She struck the back of her head. She had no loss of consciousness. She was evaluated at [facility] emergency department. She was found to have a small scalp laceration, small right temporal subdural hematoma, and small right temporal subarachnoid hemorrhage. She was accepted in transfer to the trauma service. She complains of some dizziness with movement and mild headache.  Past Medical History  Diagnosis Date  . Hypertension     Past Surgical History  Procedure Date  . Abdominal hysterectomy   . Breast surgery   . Tonsillectomy   . Eye surgery     No family history on file. Social History:  reports that she has never smoked. She does not have any smokeless tobacco history on file. She reports that she does not drink alcohol or use illicit drugs.  Allergies:  Allergies  Allergen Reactions  . Penicillins Diarrhea  . Sulfa Antibiotics Diarrhea    Medications Prior to Admission  Medication Dose Route Frequency Provider Last Rate Last Dose  . 0.9 %  sodium chloride infusion   Intravenous Continuous Laray Anger, DO       Medications Prior to Admission  Medication Sig Dispense Refill  . ALPRAZolam (XANAX) 0.5 MG tablet Take 0.5 mg by mouth 2 (two) times daily as needed. For anxiety       . metoprolol (TOPROL-XL) 100 MG 24 hr tablet Take 100 mg by mouth every morning.        . nortriptyline (PAMELOR) 50 MG capsule Take 50 mg by mouth at bedtime.        . sertraline (ZOLOFT) 100 MG tablet Take 100 mg by mouth every morning.          Results for orders placed during the hospital encounter of 05/15/11 (from the past 48 hour(s))  PROTIME-INR     Status: Normal   Collection Time   05/15/11 10:01 PM      Component Value Range Comment   Prothrombin Time 12.2  11.6 - 15.2  (seconds)    INR 0.89  0.00 - 1.49    APTT     Status: Normal   Collection Time   05/15/11 10:01 PM      Component Value Range Comment   aPTT 28  24 - 37 (seconds)   POCT I-STAT TROPONIN I     Status: Normal   Collection Time   05/15/11 10:14 PM      Component Value Range Comment   Troponin i, poc 0.01  0.00 - 0.08 (ng/mL)    Comment 3            POCT I-STAT, CHEM 8     Status: Abnormal   Collection Time   05/15/11 10:47 PM      Component Value Range Comment   Sodium 133 (*) 135 - 145 (mEq/L)    Potassium 3.1 (*) 3.5 - 5.1 (mEq/L)    Chloride 96  96 - 112 (mEq/L)    BUN 17  6 - 23 (mg/dL)    Creatinine, Ser 4.78  0.50 - 1.10 (mg/dL)    Glucose, Bld 295 (*) 70 - 99 (mg/dL)    Calcium, Ion 6.21  1.12 - 1.32 (mmol/L)    TCO2 30  0 - 100 (mmol/L)  Hemoglobin 13.9  12.0 - 15.0 (g/dL)    HCT 16.1  09.6 - 04.5 (%)    Ct Head Wo Contrast  05/15/2011  *RADIOLOGY REPORT*  Clinical Data:  Status post fall; hit back of head on pavement. Altered level of consciousness.  Concern for neck injury.  CT HEAD WITHOUT CONTRAST AND CT CERVICAL SPINE WITHOUT CONTRAST  Technique:  Multidetector CT imaging of the head and cervical spine was performed following the standard protocol without intravenous contrast.  Multiplanar CT image reconstructions of the cervical spine were also generated.  Comparison: CT of the head performed 12/25/2010, and MRI of the brain performed 09/23/2009  CT HEAD  Findings:  There is suspicion of a small amount of subarachnoid blood along the right temporal lobe.  In addition, there appears to be a small right-sided subdural bleed overlying the right temporal lobe adjacent to the suspected subarachnoid hemorrhage, measuring 4 mm in maximal thickness.  There is no additional evidence for traumatic intracranial injury.  No mass lesions are identified; there is no evidence of acute infarct.  Prominence of the ventricles and sulci reflects mild cortical volume loss.  Periventricular and  subcortical white matter change likely reflects small vessel ischemic microangiopathy.  Mild cerebellar atrophy is noted.  The brainstem and fourth ventricle are within normal limits.  The basal ganglia are unremarkable in appearance.  The cerebral hemispheres demonstrate grossly normal gray-white differentiation. No mass effect or midline shift is seen.  There is no evidence of fracture; visualized osseous structures are unremarkable in appearance.  The visualized portions of the orbits are within normal limits.  The paranasal sinuses and mastoid air cells are well-aerated.  A prominent scalp hematoma and laceration are noted overlying the high right parietal calvarium, with associated soft tissue air.  IMPRESSION:  1.  Small 4 mm right-sided subdural bleed overlying the right temporal lobe. 2.  Suspect small amount of subarachnoid blood along the right temporal lobe. 3.  No additional evidence for traumatic intracranial injury; no evidence of fracture. 4.  Prominent scalp hematoma and laceration overlying the high right parietal calvarium, with associated soft tissue air. 5.  Mild cortical volume loss and scattered small vessel ischemic microangiopathy.  CT CERVICAL SPINE  Findings: There is no evidence of acute fracture or subluxation. There is grade 1 anterolisthesis of C4 on C5, and multilevel disc space narrowing along the cervical spine, with anterior and posterior disc complexes.  Degenerative change is noted about the dens.  Facet disease is noted along the cervical spine, with fusion of the posterior elements of C3 and C4.  Mild kyphosis is noted at the lower cervical spine.  Vertebral bodies demonstrate normal height.  Prevertebral soft tissues are within normal limits.  The thyroid gland is unremarkable in appearance.  The visualized lung apices are clear.  No significant soft tissue abnormalities are seen.  IMPRESSION:  1.  No evidence of acute fracture or subluxation along the cervical spine. 2.   Degenerative change noted along the cervical spine, with mild kyphosis seen.  Critical Value/emergent results were called by telephone at the time of interpretation on 05/15/2011  at 09:27 p.m.  to  Dr. Samuel Jester, who verbally acknowledged these results.  Original Report Authenticated By: Tonia Ghent, M.D.   Ct Cervical Spine Wo Contrast  05/15/2011  *RADIOLOGY REPORT*  Clinical Data:  Status post fall; hit back of head on pavement. Altered level of consciousness.  Concern for neck injury.  CT HEAD WITHOUT CONTRAST AND CT CERVICAL  SPINE WITHOUT CONTRAST  Technique:  Multidetector CT imaging of the head and cervical spine was performed following the standard protocol without intravenous contrast.  Multiplanar CT image reconstructions of the cervical spine were also generated.  Comparison: CT of the head performed 12/25/2010, and MRI of the brain performed 09/23/2009  CT HEAD  Findings:  There is suspicion of a small amount of subarachnoid blood along the right temporal lobe.  In addition, there appears to be a small right-sided subdural bleed overlying the right temporal lobe adjacent to the suspected subarachnoid hemorrhage, measuring 4 mm in maximal thickness.  There is no additional evidence for traumatic intracranial injury.  No mass lesions are identified; there is no evidence of acute infarct.  Prominence of the ventricles and sulci reflects mild cortical volume loss.  Periventricular and subcortical white matter change likely reflects small vessel ischemic microangiopathy.  Mild cerebellar atrophy is noted.  The brainstem and fourth ventricle are within normal limits.  The basal ganglia are unremarkable in appearance.  The cerebral hemispheres demonstrate grossly normal gray-white differentiation. No mass effect or midline shift is seen.  There is no evidence of fracture; visualized osseous structures are unremarkable in appearance.  The visualized portions of the orbits are within normal limits.   The paranasal sinuses and mastoid air cells are well-aerated.  A prominent scalp hematoma and laceration are noted overlying the high right parietal calvarium, with associated soft tissue air.  IMPRESSION:  1.  Small 4 mm right-sided subdural bleed overlying the right temporal lobe. 2.  Suspect small amount of subarachnoid blood along the right temporal lobe. 3.  No additional evidence for traumatic intracranial injury; no evidence of fracture. 4.  Prominent scalp hematoma and laceration overlying the high right parietal calvarium, with associated soft tissue air. 5.  Mild cortical volume loss and scattered small vessel ischemic microangiopathy.  CT CERVICAL SPINE  Findings: There is no evidence of acute fracture or subluxation. There is grade 1 anterolisthesis of C4 on C5, and multilevel disc space narrowing along the cervical spine, with anterior and posterior disc complexes.  Degenerative change is noted about the dens.  Facet disease is noted along the cervical spine, with fusion of the posterior elements of C3 and C4.  Mild kyphosis is noted at the lower cervical spine.  Vertebral bodies demonstrate normal height.  Prevertebral soft tissues are within normal limits.  The thyroid gland is unremarkable in appearance.  The visualized lung apices are clear.  No significant soft tissue abnormalities are seen.  IMPRESSION:  1.  No evidence of acute fracture or subluxation along the cervical spine. 2.  Degenerative change noted along the cervical spine, with mild kyphosis seen.  Critical Value/emergent results were called by telephone at the time of interpretation on 05/15/2011  at 09:27 p.m.  to  Dr. Samuel Jester, who verbally acknowledged these results.  Original Report Authenticated By: Tonia Ghent, M.D.    Review of Systems  Constitutional: Negative.   HENT:       Scalp laceration  Eyes: Negative.   Respiratory: Negative.   Cardiovascular: Negative.   Gastrointestinal: Negative.   Genitourinary:  Negative.   Musculoskeletal: Negative.   Skin: Negative.   Neurological: Positive for dizziness.       Headache  Endo/Heme/Allergies: Negative.   Psychiatric/Behavioral: The patient is nervous/anxious.     Blood pressure 166/59, pulse 66, temperature 98.4 F (36.9 C), temperature source Oral, resp. rate 19, height 5\' 4"  (1.626 m), weight 57.8 kg (127 lb 6.8  oz), SpO2 99.00%. Physical Exam  Constitutional: She is oriented to person, place, and time. She appears well-developed and well-nourished. No distress.  HENT:       Small occipital scalp laceration with staples present  Eyes: EOM are normal. Pupils are equal, round, and reactive to light.  Neck: Normal range of motion. Neck supple. No tracheal deviation present.  Cardiovascular: Normal rate, regular rhythm, normal heart sounds and intact distal pulses.   No murmur heard. Respiratory: Effort normal and breath sounds normal. No respiratory distress. She has no wheezes. She has no rales.  GI: Soft. Bowel sounds are normal. She exhibits no distension. There is no tenderness. There is no rebound and no guarding.  Musculoskeletal: Normal range of motion.  Neurological: She is alert and oriented to person, place, and time.       Moves all 4 extremities with full-strength  Skin: Skin is warm and dry.     Assessment/Plan Fall TBI R temporal SDH and SAH - NS consult and F/U CT Scalp laceration HTN and anxiety - home meds Plan D/W patient and her son  Liz Malady 05/16/2011, 12:25 AM

## 2011-05-17 ENCOUNTER — Inpatient Hospital Stay (HOSPITAL_COMMUNITY): Payer: Medicare Other

## 2011-05-17 MED ORDER — OXYCODONE-ACETAMINOPHEN 5-325 MG PO TABS: 1.0000 | ORAL_TABLET | ORAL | Status: AC | PRN

## 2011-05-17 NOTE — Progress Notes (Signed)
Agree Darielys Giglia, MD, MPH, FACS Pager: [phone]  

## 2011-05-17 NOTE — Progress Notes (Signed)
Patient ID: Ebony [name], female   DOB: May 07, 1931, 78 y.o.   MRN: [id number]   LOS: 2 days   Subjective: Has some increased neck soreness today but otherwise ok.  Objective: Vital signs in last 24 hours: Temp:  [98.2 F (36.8 C)-98.9 F (37.2 C)] 98.3 F (36.8 C) (03/17 0400) Pulse Rate:  [68-82] 79  (03/17 0700) Resp:  [12-21] 14  (03/17 0700) BP: (124-181)/(59-107) 162/76 mmHg (03/17 0700) SpO2:  [94 %-100 %] 94 % (03/17 0700) Weight:  [59.5 kg (131 lb 2.8 oz)] 59.5 kg (131 lb 2.8 oz) (03/17 0500) Last BM Date: 05/15/11  *RADIOLOGY REPORT*  Clinical Data: Status post fall; hit back of head. Follow up small  subdural hematoma and subarachnoid hemorrhage.  CT HEAD WITHOUT CONTRAST  Technique: Contiguous axial images were obtained from the base of  the skull through the vertex without contrast.  Comparison: CT of the head performed 05/15/2011  Findings: The small focal subdural hematoma overlying the right  temporal lobe is perhaps slightly decreased in size, though it  still measures 4 mm in maximal thickness. No significant mass  effect is seen. There is suggestion of mild interval improvement  in the mild subarachnoid hemorrhage along the right temporal lobe.  There is no new evidence for hemorrhage. Scattered periventricular  and subcortical white matter change likely reflects small vessel  ischemic microangiopathy. Prominence of the ventricles and sulci  reflects mild cortical volume loss. Cerebellar atrophy is noted.  The brainstem and fourth ventricle are within normal limits. The  basal ganglia are unremarkable in appearance. The cerebral  hemispheres demonstrate grossly normal gray-white differentiation.  No midline shift is seen.  There is no evidence of fracture; visualized osseous structures are  unremarkable in appearance. The visualized portions of the orbits  are within normal limits. The paranasal sinuses and mastoid air  cells are well-aerated. Soft  tissue swelling is noted overlying  the high right parietal calvarium, with associated skin staples.  IMPRESSION:  1. Likely mild interval improvement in mild subarachnoid  hemorrhage along the right temporal lobe. No new subarachnoid  hemorrhage seen.  2. Small focal subdural hematoma overlying the right temporal lobe  is perhaps slightly decreased in size, still measuring 4 mm in  maximal thickness.  Original Report Authenticated By: Tonia Ghent, M.D.  General appearance: alert and no distress Resp: clear to auscultation bilaterally Cardio: regular rate and rhythm GI: normal findings: bowel sounds normal and soft, non-tender  Assessment/Plan: Fall  TBI w/SDH, SAH -- HCT stable Scalp laceration s/p repair -- Local care  HTN -- Home meds  Anxiety d/o -- Home meds  FEN -- Awaiting PT/OT  VTE -- SCD's  Dispo -- Likely home today after NS, PT/OT see.    Freeman Caldron, PA-C Pager: [phone] General Trauma PA Pager: [phone]   05/17/2011

## 2011-05-17 NOTE — Discharge Instructions (Signed)
Wash wound daily with shampoo and water in shower. Do not soak.  Avoid aspirin and NSAID's (e.g. Motrin, aleve) for 1 month. Tylenol is fine.

## 2011-05-17 NOTE — Discharge Summary (Signed)
Physician Discharge Summary  Patient ID: Ebony [name] MRN: [id number] DOB/AGE: Jun 17, 1931 78 y.o.  Admit date: 05/15/2011 Discharge date: 05/17/2011  Discharge Diagnoses Patient Active Problem List  Diagnoses Date Noted  . Fall 05/16/2011  . SDH (subdural hematoma) 05/16/2011  . Subarachnoid hemorrhage following injury 05/16/2011  . Scalp laceration 05/16/2011  . HTN (hypertension) 05/16/2011  . Anxiety disorder 05/16/2011    Consultants Dr. Newell Coral for neurosurgery  Procedures Closure of scalp laceration by EDP at [facility]  HPI: Patient was out shopping. She usually uses a walker. She did not bring her walker with her. She fell walking from the store to her car. She struck the back of her head. She had no loss of consciousness. She was evaluated at [facility] emergency department. She was found to have a small scalp laceration, small right temporal subdural hematoma, and small right temporal subarachnoid hemorrhage. She was accepted in transfer to the trauma service. She complains of some dizziness with movement and mild headache but has no neurologic deficits. She was admitted by the trauma service to the neurointensive care unit and neurosurgery was consulted. They recommended observation with repeat CT scan.   Hospital Course: The patient did extremely well in the hospital. Her head pain was abated with morphine but caused her to vomit. Following that her pain was controlled on oral medication without adverse effects. She remained neurologically intact throughout her hospital stay. Her repeat head CT showed a stable subarachnoid hemorrhage and a slightly improved subdural hematoma. She was evaluated by physical therapy and found to be at baseline. She was discharged home in good condition in the care of her family.    Medication List  As of 05/17/2011 10:37 AM   TAKE these medications         ALPRAZolam 0.5 MG tablet   Commonly known as: XANAX   Take 0.5 mg by mouth 2 (two)  times daily as needed. For anxiety      metoprolol succinate 100 MG 24 hr tablet   Commonly known as: TOPROL-XL   Take 100 mg by mouth every morning.      mulitivitamin with minerals Tabs   Take 1 tablet by mouth every morning.      nortriptyline 50 MG capsule   Commonly known as: PAMELOR   Take 50 mg by mouth at bedtime.      oxyCODONE-acetaminophen 5-325 MG per tablet   Commonly known as: PERCOCET   Take 1-2 tablets by mouth every 4 (four) hours as needed for pain.      sertraline 100 MG tablet   Commonly known as: ZOLOFT   Take 100 mg by mouth every morning.      TYLENOL EX ST ARTHRITIS PAIN 500 MG tablet   Generic drug: acetaminophen   Take 1,000 mg by mouth as needed. For pain             Follow-up Information    Follow up with Harlow Asa, MD. Schedule an appointment as soon as possible for a visit in 1 week. (Staple removal)    Contact information:   [address] [place] [address] [phone]       Follow up with Hewitt Shorts, MD. Schedule an appointment as soon as possible for a visit in 3 weeks.   Contact information:   [address] [place] [address] [phone]       Call [facility]. (As needed)    Contact information:  [address] [place] [address] [phone]         Signed: Freeman Caldron, PA-C Pager: [phone] General Trauma PA Pager: [phone]  05/17/2011, 10:37 AM

## 2011-05-17 NOTE — Progress Notes (Signed)
Physical Therapy Evaluation Patient Details Name: Ebony [name] MRN: [id number] DOB: 08/15/1931 Today's Date: 05/17/2011  Problem List:  Patient Active Problem List  Diagnoses  . Fall  . SDH (subdural hematoma)  . Subarachnoid hemorrhage following injury  . Scalp laceration  . HTN (hypertension)  . Anxiety disorder    Past Medical History:  Past Medical History  Diagnosis Date  . Hypertension    Past Surgical History:  Past Surgical History  Procedure Date  . Abdominal hysterectomy   . Breast surgery   . Tonsillectomy   . Eye surgery     05/17/11 0900  PT Visit Information  Last PT Received On 05/17/11  Precautions  Precautions Fall  Restrictions  Weight Bearing Restrictions No  Home Living  Lives With Alone  Receives Help From Neighbor  Type of Home House  Home Layout One level  Home Access Ramped entrance  Bathroom Shower/Tub Walk-in shower;Door  [employer] Handicapped height  Bathroom Accessibility Yes  How Accessible Accessible via walker  Home Adaptive Equipment Grab bars around toilet;Grab bars in shower;Shower chair with back;Other (comment) (rollator)  Prior Function  Level of Independence Independent with basic ADLs;Independent with homemaking with ambulation;Independent with gait;Independent with transfers (ambulates with RW)  Able to Take Stairs? No  Driving Yes  Vocation Retired  Equities trader (Comment)  Comments exercise at [employer]  Arousal/Alertness Awake/alert  Overall Cognitive Status Appears within functional limits for tasks assessed  Orientation Level Oriented X4  Sensation  Light Touch Appears Intact  Coordination  Gross Motor Movements are Fluid and Coordinated Yes  Heel Shin Test no difficulty  Bed Mobility  Bed Mobility No  Transfers  Transfers Yes  Sit to Stand 6: Modified independent (Device/Increase time)  Stand to Sit 6: Modified independent (Device/Increase time)  Ambulation/Gait  Ambulation/Gait  Yes  Ambulation/Gait Assistance 5: Supervision  Ambulation/Gait Assistance Details (indicate cue type and reason) Supervision due to pts increased fall risk. VC for distance of RW and sequencing  Ambulation Distance (Feet) 150 Feet  Assistive device Rolling walker  Gait Pattern WFL  Gait velocity normal  Stairs No (pt has ramp)  RLE Assessment  RLE Assessment WFL  LLE Assessment  LLE Assessment WFL  PT - End of Session  Equipment Utilized During Treatment Gait belt  Activity Tolerance Patient tolerated treatment well  Patient left in chair;with call bell in reach  Nurse Communication Mobility status for transfers;Mobility status for ambulation  General  Behavior During Session [facility] for tasks performed  Cognition [facility] for tasks performed  PT Assessment  Clinical Impression Statement Pt presents with a medical diagnosis of SDH and SAH s/p a fall while ambulating without RW. Pt is at her baseline functional level. Educated pt on importance of using her RW at home for balance safety as well as maintainin safe behaviors as to avoid possible further brain injuries. No further PT needs.  PT Recommendation/Assessment Patent does not need any further PT services  No Skilled PT All education completed;Patient at baseline level of functioning;Patient will have necessary level of assist by caregiver at discharge  PT Recommendation  Follow Up Recommendations No PT follow up;Supervision/Assistance - 24 hour  Equipment Recommended None recommended by PT    05/17/2011 Milana Kidney DPT PAGER: [phone] OFFICE: [phone]

## 2011-05-17 NOTE — Progress Notes (Signed)
Subjective:  Patient sitting up in chair, without complaints. She feels that she's feeling better. She has ambulate with the staff around the ICU.  Objective: Vital signs in last 24 hours: Filed Vitals:   05/17/11 0600 05/17/11 0700 05/17/11 0800 05/17/11 0900  BP: 161/76 162/76 152/82   Pulse: 82 79 88   Temp:      TempSrc:      Resp: 16 14 20 21   Height:      Weight:      SpO2: 96% 94% 97%     Intake/Output from previous day: 03/16 0701 - 03/17 0700 In: 240 [P.O.:240] Out: 550 [Urine:450; Emesis/NG output:100] Intake/Output this shift: Total I/O In: 200 [P.O.:200] Out: 400 [Urine:400]  Physical Exam:  Patient is awake, alert, oriented to name, [place], and 05/17/2011. Following commands with all 4 extremities. Pupils are equal round and react to light. Extraocular movements are intact. Facial movement is symmetrical. She has no drift of the upper extremities.  CBC  Basename 05/16/11 0615 05/15/11 2247  WBC 12.3* --  HGB 12.4 13.9  HCT 36.9 41.0  PLT 211 --   BMET  Basename 05/16/11 0615 05/15/11 2247  NA 133* 133*  K 3.6 3.1*  CL 96 96  CO2 29 --  GLUCOSE 113* 128*  BUN 10 17  CREATININE 0.59 0.90  CALCIUM 8.8 --    Studies/Results: Ct Head Without Contrast  05/17/2011  *RADIOLOGY REPORT*  Clinical Data: Status post fall; hit back of head.  Follow up small subdural hematoma and subarachnoid hemorrhage.  CT HEAD WITHOUT CONTRAST  Technique:  Contiguous axial images were obtained from the base of the skull through the vertex without contrast.  Comparison: CT of the head performed 05/15/2011  Findings:   The small focal subdural hematoma overlying the right temporal lobe is perhaps slightly decreased in size, though it still measures 4 mm in maximal thickness.  No significant mass effect is seen.  There is suggestion of mild interval improvement in the mild subarachnoid hemorrhage along the right temporal lobe.  There is no new evidence for hemorrhage.   Scattered periventricular and subcortical white matter change likely reflects small vessel ischemic microangiopathy.  Prominence of the ventricles and sulci reflects mild cortical volume loss.  Cerebellar atrophy is noted.  The brainstem and fourth ventricle are within normal limits.  The basal ganglia are unremarkable in appearance.  The cerebral hemispheres demonstrate grossly normal gray-white differentiation. No midline shift is seen.  There is no evidence of fracture; visualized osseous structures are unremarkable in appearance.  The visualized portions of the orbits are within normal limits.  The paranasal sinuses and mastoid air cells are well-aerated.  Soft tissue swelling is noted overlying the high right parietal calvarium, with associated skin staples.  IMPRESSION:  1.  Likely mild interval improvement in mild subarachnoid hemorrhage along the right temporal lobe.  No new subarachnoid hemorrhage seen. 2.  Small focal subdural hematoma overlying the right temporal lobe is perhaps slightly decreased in size, still measuring 4 mm in maximal thickness.  Original Report Authenticated By: Tonia Ghent, M.D.   Ct Head Wo Contrast  05/15/2011  *RADIOLOGY REPORT*  Clinical Data:  Status post fall; hit back of head on pavement. Altered level of consciousness.  Concern for neck injury.  CT HEAD WITHOUT CONTRAST AND CT CERVICAL SPINE WITHOUT CONTRAST  Technique:  Multidetector CT imaging of the head and cervical spine was performed following the standard protocol without intravenous contrast.  Multiplanar CT image reconstructions  of the cervical spine were also generated.  Comparison: CT of the head performed 12/25/2010, and MRI of the brain performed 09/23/2009  CT HEAD  Findings:  There is suspicion of a small amount of subarachnoid blood along the right temporal lobe.  In addition, there appears to be a small right-sided subdural bleed overlying the right temporal lobe adjacent to the suspected subarachnoid  hemorrhage, measuring 4 mm in maximal thickness.  There is no additional evidence for traumatic intracranial injury.  No mass lesions are identified; there is no evidence of acute infarct.  Prominence of the ventricles and sulci reflects mild cortical volume loss.  Periventricular and subcortical white matter change likely reflects small vessel ischemic microangiopathy.  Mild cerebellar atrophy is noted.  The brainstem and fourth ventricle are within normal limits.  The basal ganglia are unremarkable in appearance.  The cerebral hemispheres demonstrate grossly normal gray-white differentiation. No mass effect or midline shift is seen.  There is no evidence of fracture; visualized osseous structures are unremarkable in appearance.  The visualized portions of the orbits are within normal limits.  The paranasal sinuses and mastoid air cells are well-aerated.  A prominent scalp hematoma and laceration are noted overlying the high right parietal calvarium, with associated soft tissue air.  IMPRESSION:  1.  Small 4 mm right-sided subdural bleed overlying the right temporal lobe. 2.  Suspect small amount of subarachnoid blood along the right temporal lobe. 3.  No additional evidence for traumatic intracranial injury; no evidence of fracture. 4.  Prominent scalp hematoma and laceration overlying the high right parietal calvarium, with associated soft tissue air. 5.  Mild cortical volume loss and scattered small vessel ischemic microangiopathy.  CT CERVICAL SPINE  Findings: There is no evidence of acute fracture or subluxation. There is grade 1 anterolisthesis of C4 on C5, and multilevel disc space narrowing along the cervical spine, with anterior and posterior disc complexes.  Degenerative change is noted about the dens.  Facet disease is noted along the cervical spine, with fusion of the posterior elements of C3 and C4.  Mild kyphosis is noted at the lower cervical spine.  Vertebral bodies demonstrate normal height.   Prevertebral soft tissues are within normal limits.  The thyroid gland is unremarkable in appearance.  The visualized lung apices are clear.  No significant soft tissue abnormalities are seen.  IMPRESSION:  1.  No evidence of acute fracture or subluxation along the cervical spine. 2.  Degenerative change noted along the cervical spine, with mild kyphosis seen.  Critical Value/emergent results were called by telephone at the time of interpretation on 05/15/2011  at 09:27 p.m.  to  Dr. Samuel Jester, who verbally acknowledged these results.  Original Report Authenticated By: Tonia Ghent, M.D.   Ct Cervical Spine Wo Contrast  05/15/2011  *RADIOLOGY REPORT*  Clinical Data:  Status post fall; hit back of head on pavement. Altered level of consciousness.  Concern for neck injury.  CT HEAD WITHOUT CONTRAST AND CT CERVICAL SPINE WITHOUT CONTRAST  Technique:  Multidetector CT imaging of the head and cervical spine was performed following the standard protocol without intravenous contrast.  Multiplanar CT image reconstructions of the cervical spine were also generated.  Comparison: CT of the head performed 12/25/2010, and MRI of the brain performed 09/23/2009  CT HEAD  Findings:  There is suspicion of a small amount of subarachnoid blood along the right temporal lobe.  In addition, there appears to be a small right-sided subdural bleed overlying the right temporal  lobe adjacent to the suspected subarachnoid hemorrhage, measuring 4 mm in maximal thickness.  There is no additional evidence for traumatic intracranial injury.  No mass lesions are identified; there is no evidence of acute infarct.  Prominence of the ventricles and sulci reflects mild cortical volume loss.  Periventricular and subcortical white matter change likely reflects small vessel ischemic microangiopathy.  Mild cerebellar atrophy is noted.  The brainstem and fourth ventricle are within normal limits.  The basal ganglia are unremarkable in appearance.   The cerebral hemispheres demonstrate grossly normal gray-white differentiation. No mass effect or midline shift is seen.  There is no evidence of fracture; visualized osseous structures are unremarkable in appearance.  The visualized portions of the orbits are within normal limits.  The paranasal sinuses and mastoid air cells are well-aerated.  A prominent scalp hematoma and laceration are noted overlying the high right parietal calvarium, with associated soft tissue air.  IMPRESSION:  1.  Small 4 mm right-sided subdural bleed overlying the right temporal lobe. 2.  Suspect small amount of subarachnoid blood along the right temporal lobe. 3.  No additional evidence for traumatic intracranial injury; no evidence of fracture. 4.  Prominent scalp hematoma and laceration overlying the high right parietal calvarium, with associated soft tissue air. 5.  Mild cortical volume loss and scattered small vessel ischemic microangiopathy.  CT CERVICAL SPINE  Findings: There is no evidence of acute fracture or subluxation. There is grade 1 anterolisthesis of C4 on C5, and multilevel disc space narrowing along the cervical spine, with anterior and posterior disc complexes.  Degenerative change is noted about the dens.  Facet disease is noted along the cervical spine, with fusion of the posterior elements of C3 and C4.  Mild kyphosis is noted at the lower cervical spine.  Vertebral bodies demonstrate normal height.  Prevertebral soft tissues are within normal limits.  The thyroid gland is unremarkable in appearance.  The visualized lung apices are clear.  No significant soft tissue abnormalities are seen.  IMPRESSION:  1.  No evidence of acute fracture or subluxation along the cervical spine. 2.  Degenerative change noted along the cervical spine, with mild kyphosis seen.  Critical Value/emergent results were called by telephone at the time of interpretation on 05/15/2011  at 09:27 p.m.  to  Dr. Samuel Jester, who verbally  acknowledged these results.  Original Report Authenticated By: Tonia Ghent, M.D.    Assessment/Plan: Patient stable from a neurosurgical perspective. CT this morning shows no increase in the minimal right temporal traumatic subarachnoid hemorrhage/subdural hemorrhage. There is no mass effect or shift. Patient remains at her neurologic baseline. Case discussed with Earney Hamburg, trauma service PA: OK to discharge to home from neurosurgical perspective, will need to followup with me in my office in 2-3 weeks with a repeat CT of the brain without contrast, and also should followup with her primary physician next week or so. I have once again instructed she and her family that she does need to be using her walker when she is up and about to reduce the risk of falls and further injury.   Hewitt Shorts, MD 05/17/2011, 9:35 AM

## 2011-05-17 NOTE — Discharge Summary (Signed)
Ebony Wisby, MD, MPH, FACS Pager: [phone]  

## 2011-05-22 ENCOUNTER — Other Ambulatory Visit (HOSPITAL_COMMUNITY): Payer: Self-pay | Admitting: Neurosurgery

## 2011-05-22 ENCOUNTER — Ambulatory Visit (HOSPITAL_COMMUNITY)
Admission: RE | Admit: 2011-05-22 | Discharge: 2011-05-22 | Disposition: A | Discharge status: Home / Self Care | Payer: Medicare Other | Source: Ambulatory Visit | Attending: Neurosurgery | Admitting: Neurosurgery

## 2011-05-22 DIAGNOSIS — R42 Dizziness and giddiness: Secondary | ICD-10-CM | POA: Insufficient documentation

## 2011-05-22 DIAGNOSIS — Z09 Encounter for follow-up examination after completed treatment for conditions other than malignant neoplasm: Secondary | ICD-10-CM | POA: Insufficient documentation

## 2011-05-22 DIAGNOSIS — S066X9A Traumatic subarachnoid hemorrhage with loss of consciousness of unspecified duration, initial encounter: Secondary | ICD-10-CM

## 2011-05-22 DIAGNOSIS — I10 Essential (primary) hypertension: Secondary | ICD-10-CM | POA: Insufficient documentation

## 2011-05-22 DIAGNOSIS — I609 Nontraumatic subarachnoid hemorrhage, unspecified: Secondary | ICD-10-CM | POA: Insufficient documentation

## 2012-01-25 ENCOUNTER — Encounter (HOSPITAL_COMMUNITY): Payer: Self-pay | Admitting: Emergency Medicine

## 2012-01-25 ENCOUNTER — Emergency Department (HOSPITAL_COMMUNITY)
Admission: EM | Admit: 2012-01-25 | Discharge: 2012-01-25 | Disposition: A | Discharge status: Home / Self Care | Payer: Medicare Other | Attending: Emergency Medicine | Admitting: Emergency Medicine

## 2012-01-25 DIAGNOSIS — Y929 Unspecified place or not applicable: Secondary | ICD-10-CM | POA: Insufficient documentation

## 2012-01-25 DIAGNOSIS — W19XXXA Unspecified fall, initial encounter: Secondary | ICD-10-CM

## 2012-01-25 DIAGNOSIS — W1789XA Other fall from one level to another, initial encounter: Secondary | ICD-10-CM | POA: Insufficient documentation

## 2012-01-25 DIAGNOSIS — Y9301 Activity, walking, marching and hiking: Secondary | ICD-10-CM | POA: Insufficient documentation

## 2012-01-25 DIAGNOSIS — S0180XA Unspecified open wound of other part of head, initial encounter: Secondary | ICD-10-CM | POA: Insufficient documentation

## 2012-01-25 DIAGNOSIS — I10 Essential (primary) hypertension: Secondary | ICD-10-CM | POA: Insufficient documentation

## 2012-01-25 DIAGNOSIS — S01501A Unspecified open wound of lip, initial encounter: Secondary | ICD-10-CM | POA: Insufficient documentation

## 2012-01-25 DIAGNOSIS — S0181XA Laceration without foreign body of other part of head, initial encounter: Secondary | ICD-10-CM

## 2012-01-25 MED ORDER — LIDOCAINE HCL (PF) 2 % IJ SOLN
INTRAMUSCULAR | Status: AC
  Administered 2012-01-25: 10 mL
  Filled 2012-01-25: qty 10

## 2012-01-25 MED ORDER — LIDOCAINE HCL (PF) 2 % IJ SOLN
10.0000 mL | Freq: Once | INTRAMUSCULAR | Status: AC
  Administered 2012-01-25: 10 mL

## 2012-01-25 NOTE — ED Provider Notes (Signed)
History     CSN: [id number]  Arrival date & time 01/25/12  4098   First MD Initiated Contact with Patient 01/25/12 1845      Chief Complaint  Patient presents with  . Fall    (Consider location/radiation/quality/duration/timing/severity/associated sxs/prior treatment) Patient is a 78 y.o. female presenting with fall. The history is provided by the patient (pt fell and cut her face).  Fall The accident occurred 3 to 5 hours ago. The fall occurred while walking. She fell from a height of 1 to 2 ft. She landed on a hard floor. Point of impact: face. Pertinent negatives include no abdominal pain, no hematuria and no headaches.    Past Medical History  Diagnosis Date  . Hypertension     Past Surgical History  Procedure Date  . Abdominal hysterectomy   . Breast surgery   . Tonsillectomy   . Eye surgery     History reviewed. No pertinent family history.  History  Substance Use Topics  . Smoking status: Never Smoker   . Smokeless tobacco: Not on file  . Alcohol Use: No    OB History    Grav Para Term Preterm Abortions TAB SAB Ect Mult Living                  Review of Systems  Constitutional: Negative for fatigue.  HENT: Negative for congestion, sinus pressure and ear discharge.   Eyes: Negative for discharge.  Respiratory: Negative for cough.   Cardiovascular: Negative for chest pain.  Gastrointestinal: Negative for abdominal pain and diarrhea.  Genitourinary: Negative for frequency and hematuria.  Musculoskeletal: Negative for back pain.  Skin: Negative for rash.       Facial lac  Neurological: Negative for seizures and headaches.  Hematological: Negative.   Psychiatric/Behavioral: Negative for hallucinations.    Allergies  Penicillins and Sulfa antibiotics  Home Medications   Current Outpatient Rx  Name  Route  Sig  Dispense  Refill  . ALPRAZOLAM 0.5 MG PO TABS   Oral   Take 0.5 mg by mouth 2 (two) times daily as needed. For anxiety          .  DILTIAZEM HCL ER COATED BEADS 120 MG PO CP24   Oral   Take 120 mg by mouth daily.         Marland Kitchen METOPROLOL SUCCINATE ER 100 MG PO TB24   Oral   Take 100 mg by mouth every morning.           . ADULT MULTIVITAMIN W/MINERALS CH   Oral   Take 1 tablet by mouth every morning.         Marland Kitchen NORTRIPTYLINE HCL 50 MG PO CAPS   Oral   Take 50 mg by mouth at bedtime.           . SERTRALINE HCL 100 MG PO TABS   Oral   Take 100 mg by mouth every morning.          . TRIAMTERENE-HCTZ 37.5-25 MG PO CAPS   Oral   Take 1 capsule by mouth every morning.           BP 163/98  Pulse 66  Temp 97.5 F (36.4 C) (Axillary)  Resp 18  Ht 5\' 3"  (1.6 m)  Wt 126 lb (57.153 kg)  BMI 22.32 kg/m2  SpO2 100%  Physical Exam  Constitutional: She is oriented to person, place, and time. She appears well-developed.  HENT:  Head: Normocephalic.       Marland Kitchen  5cm laceration inside lower lip.  2 cm lac to chin,  2 cm lac under lip  Eyes: Conjunctivae normal and EOM are normal. No scleral icterus.  Neck: Neck supple. No thyromegaly present.  Cardiovascular: Normal rate and regular rhythm.  Exam reveals no gallop and no friction rub.   No murmur heard. Pulmonary/Chest: No stridor. She has no wheezes. She has no rales. She exhibits no tenderness.  Abdominal: She exhibits no distension. There is no tenderness. There is no rebound.  Musculoskeletal: Normal range of motion. She exhibits no edema.  Lymphadenopathy:    She has no cervical adenopathy.  Neurological: She is oriented to person, place, and time. Coordination normal.  Skin: No rash noted. No erythema.  Psychiatric: She has a normal mood and affect. Her behavior is normal.    ED Course  LACERATION REPAIR Performed by: Amarylis Rovito L Authorized by: Anistyn Graddy L Comments: Pt had a .5 cm lac to inside lower lip closed with 1,  5-0 vicryl,      2 cm lac to chin closed with 3,  6-0 nylon,  And 2 cm lac under lower lip closed with 3, 6-0 nylon..  Lidocaine without epi used to close lac   (including critical care time)  Labs Reviewed - No data to display No results found.   1. Fall   2. Facial laceration       MDM          Benny Lennert, MD 01/25/12 [phone]

## 2012-01-25 NOTE — ED Notes (Addendum)
Pt tripped and fell. Bruising and abrasions to face noted. Pt seen at dentist office and sent to ED for ?stiches. Denies loc.

## 2012-03-13 ENCOUNTER — Emergency Department (HOSPITAL_COMMUNITY)
Admission: EM | Admit: 2012-03-13 | Discharge: 2012-03-13 | Disposition: A | Discharge status: Home / Self Care | Payer: Medicare Other | Attending: Emergency Medicine | Admitting: Emergency Medicine

## 2012-03-13 ENCOUNTER — Encounter (HOSPITAL_COMMUNITY): Payer: Self-pay | Admitting: *Deleted

## 2012-03-13 DIAGNOSIS — Y929 Unspecified place or not applicable: Secondary | ICD-10-CM | POA: Insufficient documentation

## 2012-03-13 DIAGNOSIS — Y9301 Activity, walking, marching and hiking: Secondary | ICD-10-CM | POA: Insufficient documentation

## 2012-03-13 DIAGNOSIS — Z79899 Other long term (current) drug therapy: Secondary | ICD-10-CM | POA: Insufficient documentation

## 2012-03-13 DIAGNOSIS — I10 Essential (primary) hypertension: Secondary | ICD-10-CM | POA: Insufficient documentation

## 2012-03-13 DIAGNOSIS — R296 Repeated falls: Secondary | ICD-10-CM | POA: Insufficient documentation

## 2012-03-13 DIAGNOSIS — S51809A Unspecified open wound of unspecified forearm, initial encounter: Secondary | ICD-10-CM | POA: Insufficient documentation

## 2012-03-13 DIAGNOSIS — S51819A Laceration without foreign body of unspecified forearm, initial encounter: Secondary | ICD-10-CM

## 2012-03-13 NOTE — ED Notes (Signed)
Pt had walked out to her car without her walked and fell on against the hand rail landing on the porch. Pt states that she fell due to "shuffling her feet". Pt has multiple skin tears noted to left arm, cms intact distal, non stick dressing applied at triage.

## 2012-03-13 NOTE — ED Notes (Addendum)
Patient instructed on wound care and cleaning. Instructed to call Dr Gerda Diss for follow up.   Wound care completed by Lake Bells, RN

## 2012-03-13 NOTE — ED Notes (Signed)
Pt reports not using walker to ambulated outside to her car, fell on sidewalk and scraped left arm, multiple skin tears noted and wrapped. Denies any other injuries, no loc. Just lost balance. nad at arrival

## 2012-03-13 NOTE — ED Provider Notes (Signed)
History  This chart was scribed for Hurman Horn, MD by Madaline Brilliant, ED Scribe. The patient was seen in room APA18/APA18. Patient's care was started at 1645.   CSN: [id number]  Arrival date & time 03/13/12  1500   First MD Initiated Contact with Patient 03/13/12 1645      Chief Complaint  Patient presents with  . Fall     The history is provided by the patient. No language interpreter was used.    Ebony [name] is a 79 y.o. female who presents to the Emergency Department complaining of left arm skin tears. She uses a walker due to baseline generalized weakness. She lost her balance today and fell against the handrail her porch resulting and superficial skin tears. She is no bony or joint pain. She is no weakness or numbness. There is no head or neck injury. She is no headache neck pain back pain chest pain short of breath abdominal pain. She is no injury to her legs her right arm. She is isolated injury to her left arm with no treatment prior to arrival other than local wound care. She does wants to make sure that there is no need for sutures were formal the wound closure. She has had multiple superficial skin tears in the past. Her tetanus shot is up-to-date. She is no headache confusion weakness numbness or other concerns.    Past Medical History  Diagnosis Date  . Hypertension     Past Surgical History  Procedure Date  . Abdominal hysterectomy   . Breast surgery   . Tonsillectomy   . Eye surgery     No family history on file.  History  Substance Use Topics  . Smoking status: Never Smoker   . Smokeless tobacco: Not on file  . Alcohol Use: No    OB History    Grav Para Term Preterm Abortions TAB SAB Ect Mult Living                  Review of Systems  All other systems reviewed and are negative.  10 Systems reviewed and are negative for acute change except as noted in the HPI.  10 Systems reviewed and all are negative for acute change except as noted in the  HPI.   Allergies  Penicillins and Sulfa antibiotics  Home Medications   Current Outpatient Rx  Name  Route  Sig  Dispense  Refill  . ALPRAZOLAM 0.5 MG PO TABS   Oral   Take 0.5 mg by mouth 2 (two) times daily as needed. For anxiety          . DILTIAZEM HCL ER COATED BEADS 120 MG PO CP24   Oral   Take 120 mg by mouth daily.         Marland Kitchen METOPROLOL SUCCINATE ER 100 MG PO TB24   Oral   Take 100 mg by mouth every morning.           . ADULT MULTIVITAMIN W/MINERALS CH   Oral   Take 1 tablet by mouth every morning.         Marland Kitchen NORTRIPTYLINE HCL 50 MG PO CAPS   Oral   Take 50 mg by mouth at bedtime.           . SERTRALINE HCL 100 MG PO TABS   Oral   Take 100 mg by mouth every morning.          . TRIAMTERENE-HCTZ 37.5-25 MG PO CAPS  Oral   Take 1 capsule by mouth every morning.           Triage vitals:  BP 137/73  Pulse 70  Temp 97.3 F (36.3 C) (Oral)  Resp 16  Ht 5\' 3"  (1.6 m)  Wt 125 lb (56.7 kg)  BMI 22.14 kg/m2  SpO2 98%  Physical Exam  Nursing note and vitals reviewed. Constitutional:       Awake, alert, nontoxic appearance with baseline speech for patient.  HENT:  Head: Atraumatic.  Mouth/Throat: No oropharyngeal exudate.  Eyes: EOM are normal. Pupils are equal, round, and reactive to light. Right eye exhibits no discharge. Left eye exhibits no discharge.  Neck: Neck supple.  Cardiovascular: Normal rate and regular rhythm.   No murmur heard. Pulmonary/Chest: Effort normal and breath sounds normal. No stridor. No respiratory distress. She has no wheezes. She has no rales. She exhibits no tenderness.  Abdominal: Soft. Bowel sounds are normal. She exhibits no mass. There is no tenderness. There is no rebound.  Musculoskeletal: She exhibits tenderness.       Baseline ROM, moves extremities with no obvious new focal weakness. Right arm and both legs are nontender. Left arm is no bony tenderness no joint tenderness and has good range of motion  capillary refill less than 2 seconds normal light touch. She is multiple superficial large epidermal skin tears without foreign body noted or significant deep structure involvement noted. The skin tears are located on her forearm and upper arm.  Lymphadenopathy:    She has no cervical adenopathy.  Neurological:       Awake, alert, cooperative and aware of situation; motor strength bilaterally; sensation normal to light touch bilaterally; peripheral visual fields full to confrontation; no facial asymmetry; tongue midline; major cranial nerves appear intact; no pronator drift, normal finger to nose bilaterally  Skin: No rash noted.  Psychiatric: She has a normal mood and affect.     ED Course  Procedures (including critical care time)  DIAGNOSTIC STUDIES: Oxygen Saturation is 98% on room air, normal, by my interpretation.    COORDINATION OF CARE:    Labs Reviewed - No data to display No results found.   1. Skin tear of forearm without complication       MDM  I personally performed the services described in this documentation, which was scribed in my presence. The recorded information has been reviewed and is accurate.  Patient / Family / Caregiver informed of clinical course, understand medical decision-making process, and agree with plan.I doubt any other EMC precluding discharge at this time including, but not necessarily limited to the following: wounds amenable to primary repair.      Hurman Horn, MD 03/14/12 [phone]

## 2012-04-09 ENCOUNTER — Emergency Department (HOSPITAL_COMMUNITY): Payer: Medicare Other

## 2012-04-09 ENCOUNTER — Inpatient Hospital Stay (HOSPITAL_COMMUNITY)
Admission: EM | Admit: 2012-04-09 | Discharge: 2012-04-16 | DRG: 085 | Disposition: A | Discharge status: Skilled Nursing Facility | Payer: Medicare Other | Attending: Internal Medicine | Admitting: Internal Medicine

## 2012-04-09 DIAGNOSIS — F419 Anxiety disorder, unspecified: Secondary | ICD-10-CM | POA: Diagnosis present

## 2012-04-09 DIAGNOSIS — S065X9A Traumatic subdural hemorrhage with loss of consciousness of unspecified duration, initial encounter: Secondary | ICD-10-CM

## 2012-04-09 DIAGNOSIS — L089 Local infection of the skin and subcutaneous tissue, unspecified: Secondary | ICD-10-CM | POA: Diagnosis present

## 2012-04-09 DIAGNOSIS — E872 Acidosis, unspecified: Secondary | ICD-10-CM | POA: Diagnosis present

## 2012-04-09 DIAGNOSIS — F411 Generalized anxiety disorder: Secondary | ICD-10-CM | POA: Diagnosis present

## 2012-04-09 DIAGNOSIS — M79641 Pain in right hand: Secondary | ICD-10-CM | POA: Clinically undetermined

## 2012-04-09 DIAGNOSIS — E039 Hypothyroidism, unspecified: Secondary | ICD-10-CM | POA: Diagnosis present

## 2012-04-09 DIAGNOSIS — F19921 Other psychoactive substance use, unspecified with intoxication with delirium: Secondary | ICD-10-CM | POA: Diagnosis present

## 2012-04-09 DIAGNOSIS — R269 Unspecified abnormalities of gait and mobility: Secondary | ICD-10-CM | POA: Diagnosis present

## 2012-04-09 DIAGNOSIS — R410 Disorientation, unspecified: Secondary | ICD-10-CM

## 2012-04-09 DIAGNOSIS — S065XAA Traumatic subdural hemorrhage with loss of consciousness status unknown, initial encounter: Secondary | ICD-10-CM | POA: Diagnosis present

## 2012-04-09 DIAGNOSIS — S066XAA Traumatic subarachnoid hemorrhage with loss of consciousness status unknown, initial encounter: Secondary | ICD-10-CM

## 2012-04-09 DIAGNOSIS — Z66 Do not resuscitate: Secondary | ICD-10-CM | POA: Diagnosis present

## 2012-04-09 DIAGNOSIS — Z79899 Other long term (current) drug therapy: Secondary | ICD-10-CM

## 2012-04-09 DIAGNOSIS — S066X0A Traumatic subarachnoid hemorrhage without loss of consciousness, initial encounter: Principal | ICD-10-CM | POA: Diagnosis present

## 2012-04-09 DIAGNOSIS — S0101XA Laceration without foreign body of scalp, initial encounter: Secondary | ICD-10-CM

## 2012-04-09 DIAGNOSIS — Y92009 Unspecified place in unspecified non-institutional (private) residence as the place of occurrence of the external cause: Secondary | ICD-10-CM

## 2012-04-09 DIAGNOSIS — Y998 Other external cause status: Secondary | ICD-10-CM

## 2012-04-09 DIAGNOSIS — E860 Dehydration: Secondary | ICD-10-CM | POA: Diagnosis present

## 2012-04-09 DIAGNOSIS — I6350 Cerebral infarction due to unspecified occlusion or stenosis of unspecified cerebral artery: Secondary | ICD-10-CM | POA: Diagnosis present

## 2012-04-09 DIAGNOSIS — I4891 Unspecified atrial fibrillation: Secondary | ICD-10-CM | POA: Diagnosis not present

## 2012-04-09 DIAGNOSIS — E034 Atrophy of thyroid (acquired): Secondary | ICD-10-CM | POA: Diagnosis present

## 2012-04-09 DIAGNOSIS — E876 Hypokalemia: Secondary | ICD-10-CM | POA: Diagnosis present

## 2012-04-09 DIAGNOSIS — M25531 Pain in right wrist: Secondary | ICD-10-CM | POA: Clinically undetermined

## 2012-04-09 DIAGNOSIS — L97909 Non-pressure chronic ulcer of unspecified part of unspecified lower leg with unspecified severity: Secondary | ICD-10-CM | POA: Diagnosis present

## 2012-04-09 DIAGNOSIS — Z9181 History of falling: Secondary | ICD-10-CM

## 2012-04-09 DIAGNOSIS — S066X9A Traumatic subarachnoid hemorrhage with loss of consciousness of unspecified duration, initial encounter: Secondary | ICD-10-CM

## 2012-04-09 DIAGNOSIS — G9340 Encephalopathy, unspecified: Secondary | ICD-10-CM | POA: Diagnosis present

## 2012-04-09 DIAGNOSIS — M25579 Pain in unspecified ankle and joints of unspecified foot: Secondary | ICD-10-CM | POA: Diagnosis present

## 2012-04-09 DIAGNOSIS — W19XXXA Unspecified fall, initial encounter: Secondary | ICD-10-CM | POA: Diagnosis present

## 2012-04-09 DIAGNOSIS — I639 Cerebral infarction, unspecified: Secondary | ICD-10-CM | POA: Diagnosis present

## 2012-04-09 DIAGNOSIS — I10 Essential (primary) hypertension: Secondary | ICD-10-CM | POA: Diagnosis present

## 2012-04-09 HISTORY — DX: Hypothyroidism, unspecified: E03.9

## 2012-04-09 HISTORY — DX: Traumatic subdural hemorrhage with loss of consciousness of unspecified duration, initial encounter: S06.5X9A

## 2012-04-09 HISTORY — DX: Traumatic subarachnoid hemorrhage with loss of consciousness of unspecified duration, initial encounter: S06.6X9A

## 2012-04-09 LAB — COMPREHENSIVE METABOLIC PANEL
ALT: 32 U/L (ref 0–35)
AST: 53 U/L — ABNORMAL HIGH (ref 0–37)
Albumin: 4.1 g/dL (ref 3.5–5.2)
Alkaline Phosphatase: 148 U/L — ABNORMAL HIGH (ref 39–117)
CO2: 27 mEq/L (ref 19–32)
Chloride: 92 mEq/L — ABNORMAL LOW (ref 96–112)
GFR calc non Af Amer: 80 mL/min — ABNORMAL LOW (ref >90)
Potassium: 2.8 mEq/L — ABNORMAL LOW (ref 3.5–5.1)
Total Bilirubin: 0.8 mg/dL (ref 0.3–1.2)

## 2012-04-09 LAB — TROPONIN I: Troponin I: <0.3 ng/mL (ref <0.30)

## 2012-04-09 LAB — CBC
HCT: 38.4 % (ref 36.0–46.0)
Hemoglobin: 13.2 g/dL (ref 12.0–15.0)
MCHC: 34.4 g/dL (ref 30.0–36.0)
RDW: 14.6 % (ref 11.5–15.5)
WBC: 9.5 10*3/uL (ref 4.0–10.5)

## 2012-04-09 LAB — DIFFERENTIAL
Basophils Absolute: 0 10*3/uL (ref 0.0–0.1)
Basophils Relative: 0 % (ref 0–1)
Lymphocytes Relative: 16 % (ref 12–46)
Neutro Abs: 7.4 10*3/uL (ref 1.7–7.7)
Neutrophils Relative %: 78 % — ABNORMAL HIGH (ref 43–77)

## 2012-04-09 LAB — ETHANOL: Alcohol, Ethyl (B): <11 mg/dL (ref 0–11)

## 2012-04-09 LAB — URINALYSIS, ROUTINE W REFLEX MICROSCOPIC
Bilirubin Urine: NEGATIVE
Nitrite: NEGATIVE
Protein, ur: NEGATIVE mg/dL
Urobilinogen, UA: 0.2 mg/dL (ref 0.0–1.0)

## 2012-04-09 LAB — PROTIME-INR: INR: 0.96 (ref 0.00–1.49)

## 2012-04-09 LAB — CK: Total CK: 643 U/L — ABNORMAL HIGH (ref 7–177)

## 2012-04-09 LAB — APTT: aPTT: 30 seconds (ref 24–37)

## 2012-04-09 LAB — GLUCOSE, CAPILLARY: Glucose-Capillary: 109 mg/dL — ABNORMAL HIGH (ref 70–99)

## 2012-04-09 LAB — URINE MICROSCOPIC-ADD ON

## 2012-04-09 MED ORDER — LORAZEPAM 2 MG/ML IJ SOLN
INTRAMUSCULAR | Status: AC
  Filled 2012-04-09: qty 1

## 2012-04-09 MED ORDER — LABETALOL HCL 5 MG/ML IV SOLN
10.0000 mg | Freq: Once | INTRAVENOUS | Status: AC
  Administered 2012-04-09: 10 mg via INTRAVENOUS
  Filled 2012-04-09: qty 4

## 2012-04-09 MED ORDER — SODIUM CHLORIDE 0.9 % IV BOLUS (SEPSIS)
1000.0000 mL | Freq: Once | INTRAVENOUS | Status: AC
  Administered 2012-04-10: 1000 mL via INTRAVENOUS

## 2012-04-09 MED ORDER — LORAZEPAM 2 MG/ML IJ SOLN
1.0000 mg | Freq: Once | INTRAMUSCULAR | Status: AC
  Administered 2012-04-09: 1 mg via INTRAVENOUS

## 2012-04-09 MED ORDER — HALOPERIDOL LACTATE 5 MG/ML IJ SOLN
5.0000 mg | Freq: Once | INTRAMUSCULAR | Status: AC
  Administered 2012-04-09: 5 mg via INTRAMUSCULAR
  Filled 2012-04-09: qty 1

## 2012-04-09 MED ORDER — POTASSIUM CHLORIDE 10 MEQ/100ML IV SOLN
10.0000 meq | INTRAVENOUS | Status: AC
  Administered 2012-04-10 (×6): 10 meq via INTRAVENOUS
  Filled 2012-04-09: qty 200
  Filled 2012-04-09: qty 100
  Filled 2012-04-09: qty 200

## 2012-04-09 NOTE — ED Provider Notes (Signed)
History  Scribed for Ebony Gaskins, MD, the patient was seen in room APA05/APA05. This chart was scribed by Candelaria Stagers. The patient's care started at 9:24 PM   CSN: [id number]  Arrival date & time 04/09/12  2114   First MD Initiated Contact with Patient 04/09/12 2114      Chief Complaint  Patient presents with  . Altered Mental Status     The history is provided by the EMS personnel and a relative. The history is limited by the condition of the patient. No language interpreter was used.   Ebony [name] is a 79 y.o. female who presents to the Emergency Department arriving from home via EMS.  Her daughters state that they talked to her this morning around 9:30am and she seemed normal.  The daughter called the neighbor and asked them to check on her after she couldn't be reached via phone.  The neighbor saw her in the floor through the window and called EMS.  Pt is talkative at baseline and alert at baseline  Her current mental status is new for her.   She has new bruising on arms and legs.  Her family reports she frequently falls.  Pt has a DNR per family.  She has h/o HTN.    No known h/o CVA.  Family reports that patient lives alone but house was secure and it did not appear that there was any intrusion into the home.   No other details are known at time of arrival Past Medical History  Diagnosis Date  . Hypertension     Past Surgical History  Procedure Laterality Date  . Abdominal hysterectomy    . Breast surgery    . Tonsillectomy    . Eye surgery      No family history on file.  History  Substance Use Topics  . Smoking status: Never Smoker   . Smokeless tobacco: Not on file  . Alcohol Use: No    OB History   Grav Para Term Preterm Abortions TAB SAB Ect Mult Living                  Review of Systems  Unable to perform ROS: Mental status change    Allergies  Penicillins and Sulfa antibiotics  Home Medications   Current Outpatient Rx  Name  Route   Sig  Dispense  Refill  . ALPRAZolam (XANAX) 0.5 MG tablet   Oral   Take 0.5 mg by mouth 2 (two) times daily as needed. For anxiety          . diltiazem (CARDIZEM CD) 120 MG 24 hr capsule   Oral   Take 120 mg by mouth daily.         . metoprolol (TOPROL-XL) 100 MG 24 hr tablet   Oral   Take 100 mg by mouth every morning.           . Multiple Vitamin (MULITIVITAMIN WITH MINERALS) TABS   Oral   Take 1 tablet by mouth every morning.         . nortriptyline (PAMELOR) 50 MG capsule   Oral   Take 50 mg by mouth at bedtime.           . sertraline (ZOLOFT) 100 MG tablet   Oral   Take 100 mg by mouth every morning.          . triamterene-hydrochlorothiazide (DYAZIDE) 37.5-25 MG per capsule   Oral   Take 1 capsule by mouth every  morning.           Pt is hypertensive.  No hypoxia is noted.   BP 180/103  Pulse 103  Resp 18  SpO2 100% Rectal temp - 98.6  Physical Exam CONSTITUTIONAL: disheveled, appears confused HEAD AND FACE: Normocephalic/atraumatic EYES: EOMI ENMT: Mucous membranes dry NECK: supple no meningeal signs Spine - no obvious tenderness to C-spine, No bruising/crepitance/stepoffs noted to spine CV: S1/S2 noted, no murmurs/rubs/gallops noted Chest - no obvious tenderness noted LUNGS: Lungs are clear to auscultation bilaterally, no apparent distress ABDOMEN: soft, nontender NEURO: Pt is awake/alert, but she is confused and appears agitated.  She moves all extremities at random.  She does not speak.  GCS 11 currently EXTREMITIES: pulses normal, full ROM.  Scattered bruising to extremities but no obvious deformity noted to any extremity SKIN: warm, color normal PSYCH: anxious   ED Course  Procedures   CRITICAL CARE Performed by: Ebony [name]   Total critical care time: 45  Critical care time was exclusive of separately billable procedures and treating other patients.  Critical care was necessary to treat or prevent imminent or  life-threatening deterioration.  Critical care was time spent personally by me on the following activities: development of treatment plan with patient and/or surrogate as well as nursing, discussions with consultants, evaluation of patient's response to treatment, examination of patient, obtaining history from patient or surrogate, ordering and performing treatments and interventions, ordering and review of laboratory studies, ordering and review of radiographic studies, pulse oximetry and re-evaluation of patient's condition.  DIAGNOSTIC STUDIES: Oxygen Saturation is 98% on oxygen, normal by my interpretation.    COORDINATION OF CARE:  9:19 PM Ordered: CT Head Wo Contrast; Lorazepan (ATIVAN) injection 1 mg; CT Cervical Spine Wo Contrast; CK; Blood gas, arterial; Ethanol; Protime-INR; APTT; CBC; Differential; Comprehensive metabolic panel; Urine Drug Screen; Urinalysis, Routine w reflex microscopic; Troponin I; Glucose, capillary.  10:01 PM Pt ill appearing, confused/altered.  Unclear time of onset.  She is hypertensive.  She has been given ativan for sedation to obtain CT imaging 11:22 PM Pt found to have subdural.  I spoke to dr Newell Coral who reviewed imaging and he does not feel that this is operative (too small)  He also feels that this is not likely the cause of her AMS.  He would recommend repeat CT head in the morning but unlikely to further develop and admit to medicine.  After review of records she has h/o head injury the past I spoke to family.  They report she is DNR and did not want "extreme measures"   She is still agitated though she is protecting airway and no hypoxia She is afebrile.  Initial labs unrevealing.  Hypertensive encephalopathy is a possibility.  Labetalol ordered I have consulted hospitalist (dr Orvan Falconer) for further guidance.  Pt may still need to be transferred.   11:29 PM Pt currently resting comfortably and more calm.  Her vitals are improved.  She was given one dose  of haldol for agitation as ativan was not improving her agitation.   Labs Reviewed  GLUCOSE, CAPILLARY - Abnormal; Notable for the following:    Glucose-Capillary 109 (*)    All other components within normal limits  DIFFERENTIAL - Abnormal; Notable for the following:    Neutrophils Relative 78 (*)    All other components within normal limits  COMPREHENSIVE METABOLIC PANEL - Abnormal; Notable for the following:    Sodium 133 (*)    Potassium 2.8 (*)    Chloride 92 (*)  Glucose, Bld 118 (*)    AST 53 (*)    Alkaline Phosphatase 148 (*)    GFR calc non Af Amer 80 (*)    All other components within normal limits  CK - Abnormal; Notable for the following:    Total CK 643 (*)    All other components within normal limits  ETHANOL  PROTIME-INR  APTT  CBC  TROPONIN I  URINE RAPID DRUG SCREEN (HOSP PERFORMED)  URINALYSIS, ROUTINE W REFLEX MICROSCOPIC  BLOOD GAS, ARTERIAL      MDM  Nursing notes including past medical history and social history reviewed and considered in documentation Labs/vital reviewed and considered xrays reviewed and considered Previous records reviewed and considered - previous imaging results reviewed Discussed imaging with radiology    Date: 04/09/2012  Rate: 107  Rhythm: sinus tachycardia  QRS Axis: normal  Intervals: QT prolonged  ST/T Wave abnormalities: ST depressions laterally  Conduction Disutrbances:none  Narrative Interpretation:   Old EKG Reviewed: none available at time of interpretation    I personally performed the services described in this documentation, which was scribed in my presence. The recorded information has been reviewed and is accurate.           Ebony Gaskins, MD 04/09/12 [phone]

## 2012-04-09 NOTE — ED Notes (Signed)
Pt arrived from home via ems. Pt was found on floor by fire dept with unknown downtime. Pt not answering questions at this time. Pt was found with feces on her and over much of the house.

## 2012-04-10 ENCOUNTER — Encounter (HOSPITAL_COMMUNITY): Payer: Self-pay | Admitting: Internal Medicine

## 2012-04-10 ENCOUNTER — Inpatient Hospital Stay (HOSPITAL_COMMUNITY): Payer: Medicare Other

## 2012-04-10 DIAGNOSIS — E872 Acidosis, unspecified: Secondary | ICD-10-CM

## 2012-04-10 DIAGNOSIS — E860 Dehydration: Secondary | ICD-10-CM

## 2012-04-10 DIAGNOSIS — R404 Transient alteration of awareness: Secondary | ICD-10-CM

## 2012-04-10 DIAGNOSIS — L089 Local infection of the skin and subcutaneous tissue, unspecified: Secondary | ICD-10-CM | POA: Diagnosis present

## 2012-04-10 DIAGNOSIS — R269 Unspecified abnormalities of gait and mobility: Secondary | ICD-10-CM | POA: Diagnosis present

## 2012-04-10 DIAGNOSIS — E876 Hypokalemia: Secondary | ICD-10-CM

## 2012-04-10 DIAGNOSIS — I6200 Nontraumatic subdural hemorrhage, unspecified: Secondary | ICD-10-CM

## 2012-04-10 DIAGNOSIS — G9340 Encephalopathy, unspecified: Secondary | ICD-10-CM | POA: Diagnosis present

## 2012-04-10 LAB — CBC
Hemoglobin: 12.9 g/dL (ref 12.0–15.0)
MCV: 84.5 fL (ref 78.0–100.0)
Platelets: 230 10*3/uL (ref 150–400)
RBC: 4.52 MIL/uL (ref 3.87–5.11)
WBC: 11.7 10*3/uL — ABNORMAL HIGH (ref 4.0–10.5)

## 2012-04-10 LAB — COMPREHENSIVE METABOLIC PANEL
AST: 66 U/L — ABNORMAL HIGH (ref 0–37)
Albumin: 3.6 g/dL (ref 3.5–5.2)
Calcium: 8.9 mg/dL (ref 8.4–10.5)
Creatinine, Ser: 0.62 mg/dL (ref 0.50–1.10)
GFR calc non Af Amer: 84 mL/min — ABNORMAL LOW (ref >90)
Sodium: 136 mEq/L (ref 135–145)
Total Protein: 6.8 g/dL (ref 6.0–8.3)

## 2012-04-10 LAB — RAPID URINE DRUG SCREEN, HOSP PERFORMED
Barbiturates: NOT DETECTED
Cocaine: NOT DETECTED
Opiates: NOT DETECTED

## 2012-04-10 MED ORDER — BIOTENE DRY MOUTH MT LIQD
15.0000 mL | Freq: Two times a day (BID) | OROMUCOSAL | Status: DC
  Administered 2012-04-10 – 2012-04-16 (×8): 15 mL via OROMUCOSAL

## 2012-04-10 MED ORDER — HYDRALAZINE HCL 20 MG/ML IJ SOLN
10.0000 mg | INTRAMUSCULAR | Status: DC | PRN
  Administered 2012-04-10 (×2): 10 mg via INTRAVENOUS
  Administered 2012-04-10: 20 mg via INTRAVENOUS
  Administered 2012-04-10: 10 mg via INTRAVENOUS
  Filled 2012-04-10 (×3): qty 1

## 2012-04-10 MED ORDER — CHLORHEXIDINE GLUCONATE 0.12 % MT SOLN
15.0000 mL | Freq: Two times a day (BID) | OROMUCOSAL | Status: DC
  Administered 2012-04-10 – 2012-04-14 (×9): 15 mL via OROMUCOSAL
  Filled 2012-04-10 (×9): qty 15

## 2012-04-10 MED ORDER — HYDRALAZINE HCL 20 MG/ML IJ SOLN
INTRAMUSCULAR | Status: AC
  Administered 2012-04-10: 20 mg via INTRAVENOUS
  Filled 2012-04-10: qty 1

## 2012-04-10 MED ORDER — PANTOPRAZOLE SODIUM 40 MG IV SOLR
40.0000 mg | Freq: Every day | INTRAVENOUS | Status: DC
  Administered 2012-04-10 (×2): 40 mg via INTRAVENOUS
  Filled 2012-04-10 (×2): qty 40

## 2012-04-10 MED ORDER — LORAZEPAM 2 MG/ML IJ SOLN
1.0000 mg | INTRAMUSCULAR | Status: DC | PRN
  Administered 2012-04-10 (×4): 1 mg via INTRAVENOUS
  Filled 2012-04-10 (×4): qty 1

## 2012-04-10 MED ORDER — LEVOFLOXACIN IN D5W 500 MG/100ML IV SOLN
INTRAVENOUS | Status: AC
  Filled 2012-04-10: qty 100

## 2012-04-10 MED ORDER — POTASSIUM CHLORIDE 10 MEQ/100ML IV SOLN
10.0000 meq | INTRAVENOUS | Status: AC
  Administered 2012-04-10 (×6): 10 meq via INTRAVENOUS
  Filled 2012-04-10: qty 600

## 2012-04-10 MED ORDER — HALOPERIDOL LACTATE 5 MG/ML IJ SOLN
2.5000 mg | INTRAMUSCULAR | Status: DC | PRN
  Administered 2012-04-10 – 2012-04-11 (×6): 2.5 mg via INTRAVENOUS
  Filled 2012-04-10 (×7): qty 1

## 2012-04-10 MED ORDER — POTASSIUM CHLORIDE IN NACL 20-0.9 MEQ/L-% IV SOLN
INTRAVENOUS | Status: DC
  Administered 2012-04-10 – 2012-04-11 (×4): via INTRAVENOUS
  Administered 2012-04-12: 1000 mL via INTRAVENOUS

## 2012-04-10 MED ORDER — ACETAMINOPHEN 650 MG RE SUPP: 650.0000 mg | RECTAL | Status: DC | PRN

## 2012-04-10 MED ORDER — LEVOFLOXACIN IN D5W 500 MG/100ML IV SOLN
500.0000 mg | Freq: Every day | INTRAVENOUS | Status: DC
  Administered 2012-04-10 (×2): 500 mg via INTRAVENOUS
  Filled 2012-04-10 (×2): qty 100

## 2012-04-10 MED ORDER — ONDANSETRON HCL 4 MG/2ML IJ SOLN: 4.0000 mg | INTRAMUSCULAR | Status: DC | PRN

## 2012-04-10 MED ORDER — METOPROLOL TARTRATE 1 MG/ML IV SOLN
5.0000 mg | Freq: Four times a day (QID) | INTRAVENOUS | Status: DC
  Administered 2012-04-10 – 2012-04-11 (×6): 5 mg via INTRAVENOUS
  Filled 2012-04-10 (×6): qty 5

## 2012-04-10 MED ORDER — FLEET ENEMA 7-19 GM/118ML RE ENEM: 1.0000 | ENEMA | Freq: Once | RECTAL | Status: AC | PRN

## 2012-04-10 NOTE — Consult Note (Signed)
WOC consult Note Reason for Consult:Chronic ulceration of the non-healing right lateral LE-full thickness.  Patient examined with her two daughters present via elink telemedicine technology and with the assistance of her nurse Banker). Wound type:venous insufficiency vs arterial insufficiency Pressure Ulcer POA: No Measurement:2 cm x 3 cm x .4cm (in center), .2cm at edge. Wound WUJ:WJXBJ, 80% clean, 20% slough. Drainage (amount, consistency, odor) Small amount of serous exudate Periwound:intact, minor pale erythema to .5cm circumferenctially Dressing procedure/placement/frequency: I will add a small piece of silver hydrofiber to accommodate the slough and exudate-also to provide a small amount of antimicrobial coverage for the likely presence of biofilm.  This will be covered with a thin film dressing and changed twice weekly.  Patient to follow with PCP as she has in the past for this ulceration. I will remain available to this patient, her family, the nursing and medical staff but not follow.  Please re-consult if needed. Thanks, Ladona Mow, MSN, RN, [facility], CWOCN [phone])

## 2012-04-10 NOTE — Progress Notes (Signed)
Subjective: This lady was noted yesterday with altered mental status. She was found to have a small subdural hematomas but it was felt that her confusion was likely related to her clinical dehydration. This morning she remains dehydrated and not really following any commands whatsoever. She has received IV fluids overnight.           Physical Exam: Blood pressure 120/55, pulse 105, temperature 98.2 F (36.8 C), temperature source Axillary, resp. rate 24, height 5\' 5"  (1.651 m), weight 60.2 kg (132 lb 11.5 oz), SpO2 99.00%. Confused, no verbalization. She is trying to remove her mittens and trying to get out of bed. Heart sounds are in sinus rhythm, sinus tachycardia. Lung fields are clear. There are no focalizing signs, she seems to moving all her limbs. There is no obvious facial asymmetry.   Investigations:  Recent Results (from the past 240 hour(s))  CULTURE, BLOOD (ROUTINE X 2)     Status: None   Collection Time    04/10/12  1:35 AM      Result Value Range Status   Specimen Description Blood RIGHT ANTECUBITAL   Final   Special Requests BOTTLES DRAWN AEROBIC AND ANAEROBIC 7CC   Final   Culture NO GROWTH <24 HRS   Final   Report Status PENDING   Incomplete  CULTURE, BLOOD (ROUTINE X 2)     Status: None   Collection Time    04/10/12  1:39 AM      Result Value Range Status   Specimen Description Blood BLOOD RIGHT ARM   Final   Special Requests BOTTLES DRAWN AEROBIC AND ANAEROBIC 6CC   Final   Culture NO GROWTH <24 HRS   Final   Report Status PENDING   Incomplete  MRSA PCR SCREENING     Status: None   Collection Time    04/10/12  4:00 AM      Result Value Range Status   MRSA by PCR NEGATIVE  NEGATIVE Final   Comment:            The GeneXpert MRSA Assay (FDA     approved for NASAL specimens     only), is one component of a     comprehensive MRSA colonization     surveillance program. It is not     intended to diagnose MRSA     infection nor to guide or      monitor treatment for     MRSA infections.     Basic Metabolic Panel:  Recent Labs  40/98/11 2136 04/10/12 0458  NA 133* 136  K 2.8* 3.3*  CL 92* 98  CO2 27 24  GLUCOSE 118* 142*  BUN 12 9  CREATININE 0.73 0.62  CALCIUM 9.9 8.9  MG 1.4*  --    Liver Function Tests:  Recent Labs  04/09/12 2136 04/10/12 0458  AST 53* 66*  ALT 32 33  ALKPHOS 148* 134*  BILITOT 0.8 0.7  PROT 7.3 6.8  ALBUMIN 4.1 3.6     CBC:  Recent Labs  04/09/12 2136 04/10/12 0458  WBC 9.5 11.7*  NEUTROABS 7.4  --   HGB 13.2 12.9  HCT 38.4 38.2  MCV 84.0 84.5  PLT 225 230    Ct Head Wo Contrast  04/09/2012  *RADIOLOGY REPORT*  Clinical Data:  The patient was found unresponsive at home.  Pain and agitation.  Altered mental status.  CT HEAD WITHOUT CONTRAST CT CERVICAL SPINE WITHOUT CONTRAST  Technique:  Multidetector CT imaging  of the head and cervical spine was performed following the standard protocol without intravenous contrast.  Multiplanar CT image reconstructions of the cervical spine were also generated.  Comparison:  CT head 05/22/2011.  CT head and cervical spine 05/15/2011  CT HEAD  Findings: There is an acute subdural hematoma along the right anterior frontal region measuring 9.3 mm depth.  There is also minimal left parafalcine subdural hematoma anteriorly, measuring maximal depth of about 4 mm.  There is no mass effect or midline shift.  The no intraparenchymal, subarachnoid, or intraventricular hemorrhage is identified.  There is mild diffuse cerebral atrophy.  Ventricular dilatation consistent with central atrophy.  Gray-white matter junctions are distinct.  Basal cisterns are not effaced.  Low attenuation change in the periventricular white matter is consistent with small vessel ischemia.  No depressed skull fractures.  Visualized paranasal sinuses and mastoid air cells are not opacified.  Suggestion of nasal bone fractures, incompletely visualized.  These may be acute.  IMPRESSION:  Acute subdural hematomas in the right anterior frontal and left anterior parafalcine regions.  No mass effect or midline shift.  CT CERVICAL SPINE  Findings: There is reversal of the usual cervical lordosis and anterior subluxation of about 3 mm at C4-5.  This appears stable since the previous study is likely due to degenerative change. There are degenerative changes diffusely throughout cervical spine with narrowed cervical interspaces assess endplate hypertrophic changes.  Degenerate changes throughout the cervical facet joints. Lateral masses of C1 are symmetrical.  The odontoid process is intact.  No vertebral compression deformities.  No prevertebral soft tissue swelling.  No focal bone lesion or bone destruction. Bone cortex and trabecular architecture appear intact.  No abnormal paraspinal soft tissue infiltration.  IMPRESSION: Diffuse degenerative changes throughout the cervical spine.  Stable anterior subluxation of C4 on C5.  No acute displaced fractures identified.  Results were discussed by telephone with Dr. Bebe Shaggy at the 2230 hours on 04/09/2012.   Original Report Authenticated By: Burman Nieves, M.D.    Ct Cervical Spine Wo Contrast  04/09/2012  *RADIOLOGY REPORT*  Clinical Data:  The patient was found unresponsive at home.  Pain and agitation.  Altered mental status.  CT HEAD WITHOUT CONTRAST CT CERVICAL SPINE WITHOUT CONTRAST  Technique:  Multidetector CT imaging of the head and cervical spine was performed following the standard protocol without intravenous contrast.  Multiplanar CT image reconstructions of the cervical spine were also generated.  Comparison:  CT head 05/22/2011.  CT head and cervical spine 05/15/2011  CT HEAD  Findings: There is an acute subdural hematoma along the right anterior frontal region measuring 9.3 mm depth.  There is also minimal left parafalcine subdural hematoma anteriorly, measuring maximal depth of about 4 mm.  There is no mass effect or midline shift.  The no  intraparenchymal, subarachnoid, or intraventricular hemorrhage is identified.  There is mild diffuse cerebral atrophy.  Ventricular dilatation consistent with central atrophy.  Gray-white matter junctions are distinct.  Basal cisterns are not effaced.  Low attenuation change in the periventricular white matter is consistent with small vessel ischemia.  No depressed skull fractures.  Visualized paranasal sinuses and mastoid air cells are not opacified.  Suggestion of nasal bone fractures, incompletely visualized.  These may be acute.  IMPRESSION: Acute subdural hematomas in the right anterior frontal and left anterior parafalcine regions.  No mass effect or midline shift.  CT CERVICAL SPINE  Findings: There is reversal of the usual cervical lordosis and anterior subluxation of about  3 mm at C4-5.  This appears stable since the previous study is likely due to degenerative change. There are degenerative changes diffusely throughout cervical spine with narrowed cervical interspaces assess endplate hypertrophic changes.  Degenerate changes throughout the cervical facet joints. Lateral masses of C1 are symmetrical.  The odontoid process is intact.  No vertebral compression deformities.  No prevertebral soft tissue swelling.  No focal bone lesion or bone destruction. Bone cortex and trabecular architecture appear intact.  No abnormal paraspinal soft tissue infiltration.  IMPRESSION: Diffuse degenerative changes throughout the cervical spine.  Stable anterior subluxation of C4 on C5.  No acute displaced fractures identified.  Results were discussed by telephone with Dr. Bebe Shaggy at the 2230 hours on 04/09/2012.   Original Report Authenticated By: Burman Nieves, M.D.    Dg Chest Port 1 View  04/10/2012  *RADIOLOGY REPORT*  Clinical Data: Altered mental status.  PORTABLE CHEST - 1 VIEW  Comparison: None.  Findings: Normal heart size and pulmonary vascularity.  Central interstitial changes and peribronchial thickening  suggesting chronic bronchitis.  Linear atelectasis versus vascular crowding in the left lung base.  No focal consolidation or airspace disease. No blunting of costophrenic angles.  No pneumothorax.  Mediastinal contours appear intact.  Tortuous aorta.  Degenerative changes in the spine.  IMPRESSION: Chronic bronchitic changes.  No evidence of active consolidation.   Original Report Authenticated By: Burman Nieves, M.D.       Medications: I have reviewed the patient's current medications.  Impression: 1. Confusion/altered mental status. The etiology of this to me is not entirely clear. I'm not convinced that this level of confusion is related to marked dehydration. Her BUN/creatinine were not significantly elevated and at the present time she does not look severely dehydrated. I wonder if she is actually had a CVA on top of her subdural hematomas. 2. Hypokalemia. 3. Dehydration. 4. Hypertension.     Plan: 1. Reduce IV fluids. 2. Replete potassium. 3. MRI brain scan. This will not be able to be done today but will be done tomorrow. 4. Keep in ICU today.     LOS: 1 day   Wilson Singer Pager [phone]  04/10/2012, 7:56 AM

## 2012-04-10 NOTE — Progress Notes (Signed)
PT BEGAN MAKING MINIMAL VERBAL ONE WORD COMMUNICATION AROUND 11OOAM.  ANSWERS ARE NOT ALWAYS COHEARANT OR RELAVENT.

## 2012-04-10 NOTE — H&P (Signed)
[facility] History and Physical  Ebony [name]  ZOX:[id number]  DOB: [birth year]   DOA: 04/10/2012   PCP:   Harlow Asa, MD   Chief Complaint:  Acute confusion since today  HPI: Ebony [name] is an 79 y.o. female.   Elderly Caucasian lady who lives alone, is usually alert oriented and talkative, does suffer with hypertension and medications includes a diuretic, does have a  shuffling gait abnormality, with a recommended walker, but history of or current falls because of failure to use her walker. Over the past few months has had recurrent falls which have caused facial laceration. right leg wound, and recently the left upper extremity abrasions. She has a history of subdural hematoma and subarachnoid hemorrhage related to a fall year ago.  Family reports that last week she was complaining that she felt as if her urine was infected;   She was last known to the normal in the late morning, but family have been getting no response to phone calls throughout the day and eventually visited her in the late evening, found her confused covered in feces and with the house concavity by feces. She was brought to the emergency room and required Haldol and Ativan sedation in the emergency room; CT scan showed no acute fracture, but did show small subdural hematoma; lab work is significant for marked hypokalemia, and her CK is mildly elevated.  Emergency room physician reportedly discussed with Dr. Newell Coral neurosurgeon, and he recommended nonoperative management; family indicates the patient's desires no aggressive management at this time.   There is no history of antibiotic use Family of been trying to make her get her to go to live with her daughter Darl Pikes so she can be more closely supervised   Rewiew of Systems:   unable to attain because of patient's mental status    Past Medical History  Diagnosis Date  . Hypertension     Past Surgical History  Procedure Laterality Date  .  Abdominal hysterectomy    . Breast surgery    . Tonsillectomy    . Eye surgery      Medications:  HOME MEDS: Prior to Admission medications   Medication Sig Start Date End Date Taking? Authorizing Provider  ALPRAZolam Prudy Feeler) 0.5 MG tablet Take 0.5 mg by mouth 2 (two) times daily as needed. For anxiety     Historical Provider, MD  diltiazem (CARDIZEM CD) 120 MG 24 hr capsule Take 120 mg by mouth daily.    Historical Provider, MD  metoprolol (TOPROL-XL) 100 MG 24 hr tablet Take 100 mg by mouth every morning.      Historical Provider, MD  Multiple Vitamin (MULITIVITAMIN WITH MINERALS) TABS Take 1 tablet by mouth every morning.    Historical Provider, MD  nortriptyline (PAMELOR) 50 MG capsule Take 50 mg by mouth at bedtime.      Historical Provider, MD  sertraline (ZOLOFT) 100 MG tablet Take 100 mg by mouth every morning.     Historical Provider, MD  triamterene-hydrochlorothiazide (DYAZIDE) 37.5-25 MG per capsule Take 1 capsule by mouth every morning.    Historical Provider, MD     Allergies:  Allergies  Allergen Reactions  . Penicillins Diarrhea  . Sulfa Antibiotics Diarrhea    Social History:   reports that she has never smoked. She does not have any smokeless tobacco history on file. She reports that she does not drink alcohol or use illicit drugs.  Family History: No family history on file.  negative for any chronic  medical conditions. She did have a sister who was a smoker and died of lung cancer.   Physical Exam: Filed Vitals:   04/09/12 2135 04/09/12 2226 04/09/12 2300  BP: 165/108 180/103 120/80  Pulse: 114 103   Resp:  18 25  SpO2: 99% 100%    Blood pressure 120/80, pulse 103, resp. rate 25, SpO2 100.00%.  GEN:   sedated elderly Caucasian ladylying in the stretcher in no  respiratory distress; resists examination; keeps her eyes closed  PSYCH:  alert and oriented x0;  HEENT: Mucous membranes pink and anicteric; PERRLA; lips dry and cracked; thyromegaly or  carotid bruit; no JVD; Breasts:: Not examined CHEST WALL: No tenderness CHEST: Normal respiration, clear to auscultation bilaterally HEART: Regular rate and rhythm; no murmurs rubs or gallops BACK: No kyphosis or scoliosis; no CVA tenderness ABDOMEN: Obese, soft non-tender; no masses, no organomegaly, normal abdominal bowel sounds; no intertriginous candida. Rectal Exam: Not done EXTREMITIES: age-appropriate arthropathy of the hands and knees;   4 cm right leg ulcer, dark red and black pebbled base with surrounding erythema . Genitalia: not examined PULSES: 2+ and symmetric SKIN:  Extensive bruising of the left upper; bruises face arms legs, many without breakage of skin.  CNS:  no focal lateralizing neurologic deficit; moves all limbs equally attempts to resist examination    Labs on Admission:  Basic Metabolic Panel:  Recent Labs Lab 04/09/12 2136  NA 133*  K 2.8*  CL 92*  CO2 27  GLUCOSE 118*  BUN 12  CREATININE 0.73  CALCIUM 9.9  MG 1.4*   Liver Function Tests:  Recent Labs Lab 04/09/12 2136  AST 53*  ALT 32  ALKPHOS 148*  BILITOT 0.8  PROT 7.3  ALBUMIN 4.1   No results found for this basename: LIPASE, AMYLASE,  in the last 168 hours No results found for this basename: AMMONIA,  in the last 168 hours CBC:  Recent Labs Lab 04/09/12 2136  WBC 9.5  NEUTROABS 7.4  HGB 13.2  HCT 38.4  MCV 84.0  PLT 225   Cardiac Enzymes:  Recent Labs Lab 04/09/12 2136  CKTOTAL 643*  TROPONINI <0.30   BNP: No components found with this basename: POCBNP,  D-dimer: No components found with this basename: D-DIMER,  CBG:  Recent Labs Lab 04/09/12 2125  GLUCAP 109*    Radiological Exams on Admission: Ct Head Wo Contrast  04/09/2012  *RADIOLOGY REPORT*  Clinical Data:  The patient was found unresponsive at home.  Pain and agitation.  Altered mental status.  CT HEAD WITHOUT CONTRAST CT CERVICAL SPINE WITHOUT CONTRAST  Technique:  Multidetector CT imaging of the head  and cervical spine was performed following the standard protocol without intravenous contrast.  Multiplanar CT image reconstructions of the cervical spine were also generated.  Comparison:  CT head 05/22/2011.  CT head and cervical spine 05/15/2011  CT HEAD  Findings: There is an acute subdural hematoma along the right anterior frontal region measuring 9.3 mm depth.  There is also minimal left parafalcine subdural hematoma anteriorly, measuring maximal depth of about 4 mm.  There is no mass effect or midline shift.  The no intraparenchymal, subarachnoid, or intraventricular hemorrhage is identified.  There is mild diffuse cerebral atrophy.  Ventricular dilatation consistent with central atrophy.  Gray-white matter junctions are distinct.  Basal cisterns are not effaced.  Low attenuation change in the periventricular white matter is consistent with small vessel ischemia.  No depressed skull fractures.  Visualized paranasal sinuses and mastoid air  cells are not opacified.  Suggestion of nasal bone fractures, incompletely visualized.  These may be acute.  IMPRESSION: Acute subdural hematomas in the right anterior frontal and left anterior parafalcine regions.  No mass effect or midline shift.  CT CERVICAL SPINE  Findings: There is reversal of the usual cervical lordosis and anterior subluxation of about 3 mm at C4-5.  This appears stable since the previous study is likely due to degenerative change. There are degenerative changes diffusely throughout cervical spine with narrowed cervical interspaces assess endplate hypertrophic changes.  Degenerate changes throughout the cervical facet joints. Lateral masses of C1 are symmetrical.  The odontoid process is intact.  No vertebral compression deformities.  No prevertebral soft tissue swelling.  No focal bone lesion or bone destruction. Bone cortex and trabecular architecture appear intact.  No abnormal paraspinal soft tissue infiltration.  IMPRESSION: Diffuse degenerative  changes throughout the cervical spine.  Stable anterior subluxation of C4 on C5.  No acute displaced fractures identified.  Results were discussed by telephone with Dr. Bebe Shaggy at the 2230 hours on 04/09/2012.   Original Report Authenticated By: Burman Nieves, M.D.    Ct Cervical Spine Wo Contrast  04/09/2012  *RADIOLOGY REPORT*  Clinical Data:  The patient was found unresponsive at home.  Pain and agitation.  Altered mental status.  CT HEAD WITHOUT CONTRAST CT CERVICAL SPINE WITHOUT CONTRAST  Technique:  Multidetector CT imaging of the head and cervical spine was performed following the standard protocol without intravenous contrast.  Multiplanar CT image reconstructions of the cervical spine were also generated.  Comparison:  CT head 05/22/2011.  CT head and cervical spine 05/15/2011  CT HEAD  Findings: There is an acute subdural hematoma along the right anterior frontal region measuring 9.3 mm depth.  There is also minimal left parafalcine subdural hematoma anteriorly, measuring maximal depth of about 4 mm.  There is no mass effect or midline shift.  The no intraparenchymal, subarachnoid, or intraventricular hemorrhage is identified.  There is mild diffuse cerebral atrophy.  Ventricular dilatation consistent with central atrophy.  Gray-white matter junctions are distinct.  Basal cisterns are not effaced.  Low attenuation change in the periventricular white matter is consistent with small vessel ischemia.  No depressed skull fractures.  Visualized paranasal sinuses and mastoid air cells are not opacified.  Suggestion of nasal bone fractures, incompletely visualized.  These may be acute.  IMPRESSION: Acute subdural hematomas in the right anterior frontal and left anterior parafalcine regions.  No mass effect or midline shift.  CT CERVICAL SPINE  Findings: There is reversal of the usual cervical lordosis and anterior subluxation of about 3 mm at C4-5.  This appears stable since the previous study is likely due  to degenerative change. There are degenerative changes diffusely throughout cervical spine with narrowed cervical interspaces assess endplate hypertrophic changes.  Degenerate changes throughout the cervical facet joints. Lateral masses of C1 are symmetrical.  The odontoid process is intact.  No vertebral compression deformities.  No prevertebral soft tissue swelling.  No focal bone lesion or bone destruction. Bone cortex and trabecular architecture appear intact.  No abnormal paraspinal soft tissue infiltration.  IMPRESSION: Diffuse degenerative changes throughout the cervical spine.  Stable anterior subluxation of C4 on C5.  No acute displaced fractures identified.  Results were discussed by telephone with Dr. Bebe Shaggy at the 2230 hours on 04/09/2012.   Original Report Authenticated By: Burman Nieves, M.D.    Dg Chest Port 1 View  04/10/2012  *RADIOLOGY REPORT*  Clinical Data:  Altered mental status.  PORTABLE CHEST - 1 VIEW  Comparison: None.  Findings: Normal heart size and pulmonary vascularity.  Central interstitial changes and peribronchial thickening suggesting chronic bronchitis.  Linear atelectasis versus vascular crowding in the left lung base.  No focal consolidation or airspace disease. No blunting of costophrenic angles.  No pneumothorax.  Mediastinal contours appear intact.  Tortuous aorta.  Degenerative changes in the spine.  IMPRESSION: Chronic bronchitic changes.  No evidence of active consolidation.   Original Report Authenticated By: Burman Nieves, M.D.      Assessment/Plan Present on Admission:   . Delirium . Encephalopathy acute  Likely the result of marked dehydration aggravated by diuretic use; possible occult infection; possible small subclinical stroke - we'll admit to intensive care unit for supportive care; will try to limit sedating medications as much as possible, but this may be difficult if patient remains agitated and the danger to herself.  -- Will get blood cultures  and start empiric antibiotics  . Dehydration  Likely related to poor intake, aggravated by diuretic use; treat with IV fluid hydration; because of mental status changes she will be maintained n.p.o. for the time being.  . Hypokalemia  Likely due to dehydration, aggravated by diuretic use; intravenous potassium repletion  . SDH (subdural hematoma)  Likely secondary to fall; but not likely to be the cause of encephalopathy; keep blood pressure low repeat CT of the brain in the morning.  . Fall, due to gait abnormality. Will likely need temporary SNF/rehabilitation placement when delirium resolves  . Anxiety disorder  There is a possibility of her delirium is related to a benzodiazepine withdrawal, but we do not have enough information on her medication usage to know for sure;  . HTN (hypertension) uncontrolled  Hypertensive encephalopathy may be the cause of her delirium; but is more likely due to missing medications. We'll manage blood pressure with metoprolol and hydralazine as necessary; keep blood pressure on the low side because of subdural hemorrhage;   . Infected traumatic leg ulcer;  Wound nurse consult; blood cultures and empiric antibiotic therapy   . Primary lactic acidemia  Probably secondary to dehydration; possibly infection; we'll manage with hydration and empiric antibiotics;   PLAN:   Other plans as per orders.  Code Status: DNR Family Communication: Joint HCPOAs  Daughters, Waynetta Sandy [phone]; Darl Pikes [phone] Disposition Plan:  depending on response to resuscitation  Critical care time: 60 minutes.   Airiana Elman Nocturnist [facility] Pager [phone]  04/10/2012, 12:37 AM

## 2012-04-11 ENCOUNTER — Inpatient Hospital Stay (HOSPITAL_COMMUNITY): Payer: Medicare Other

## 2012-04-11 ENCOUNTER — Encounter (HOSPITAL_COMMUNITY): Payer: Self-pay | Admitting: Radiology

## 2012-04-11 DIAGNOSIS — I6789 Other cerebrovascular disease: Secondary | ICD-10-CM

## 2012-04-11 DIAGNOSIS — I639 Cerebral infarction, unspecified: Secondary | ICD-10-CM | POA: Diagnosis present

## 2012-04-11 DIAGNOSIS — I10 Essential (primary) hypertension: Secondary | ICD-10-CM

## 2012-04-11 HISTORY — DX: Cerebral infarction, unspecified: I63.9

## 2012-04-11 LAB — BASIC METABOLIC PANEL
BUN: 10 mg/dL (ref 6–23)
Creatinine, Ser: 0.67 mg/dL (ref 0.50–1.10)
GFR calc non Af Amer: 82 mL/min — ABNORMAL LOW (ref >90)
Glucose, Bld: 93 mg/dL (ref 70–99)
Potassium: 3.7 mEq/L (ref 3.5–5.1)

## 2012-04-11 LAB — URINE CULTURE
Culture: NO GROWTH
Special Requests: NORMAL

## 2012-04-11 MED ORDER — DILTIAZEM HCL ER COATED BEADS 120 MG PO CP24
120.0000 mg | ORAL_CAPSULE | Freq: Every day | ORAL | Status: DC
  Administered 2012-04-11 – 2012-04-16 (×6): 120 mg via ORAL
  Filled 2012-04-11 (×6): qty 1

## 2012-04-11 MED ORDER — METOPROLOL SUCCINATE ER 50 MG PO TB24
100.0000 mg | ORAL_TABLET | ORAL | Status: DC
  Administered 2012-04-11 – 2012-04-16 (×6): 100 mg via ORAL
  Filled 2012-04-11 (×2): qty 1
  Filled 2012-04-11 (×5): qty 2

## 2012-04-11 MED ORDER — MAGNESIUM SULFATE 40 MG/ML IJ SOLN
2.0000 g | Freq: Once | INTRAMUSCULAR | Status: AC
  Administered 2012-04-11: 2 g via INTRAVENOUS
  Filled 2012-04-11: qty 50

## 2012-04-11 MED ORDER — SERTRALINE HCL 50 MG PO TABS
100.0000 mg | ORAL_TABLET | Freq: Every morning | ORAL | Status: DC
  Administered 2012-04-11 – 2012-04-16 (×6): 100 mg via ORAL
  Filled 2012-04-11: qty 2
  Filled 2012-04-11: qty 1
  Filled 2012-04-11 (×4): qty 2
  Filled 2012-04-11: qty 1

## 2012-04-11 MED ORDER — NORTRIPTYLINE HCL 25 MG PO CAPS
50.0000 mg | ORAL_CAPSULE | Freq: Every day | ORAL | Status: DC
  Administered 2012-04-11 – 2012-04-15 (×5): 50 mg via ORAL
  Filled 2012-04-11 (×3): qty 2

## 2012-04-11 MED ORDER — ALPRAZOLAM 0.5 MG PO TABS
0.5000 mg | ORAL_TABLET | Freq: Every day | ORAL | Status: DC
  Administered 2012-04-12 – 2012-04-15 (×4): 0.5 mg via ORAL
  Filled 2012-04-11 (×4): qty 1

## 2012-04-11 MED ORDER — ACETAMINOPHEN 325 MG PO TABS
650.0000 mg | ORAL_TABLET | Freq: Four times a day (QID) | ORAL | Status: DC | PRN
  Administered 2012-04-12: 650 mg via ORAL
  Filled 2012-04-11: qty 2

## 2012-04-11 NOTE — Progress Notes (Signed)
Patient HR down to 108, medication was effective.

## 2012-04-11 NOTE — Progress Notes (Signed)
PT Cancellation Note  Patient Details Name: Ebony [name] MRN: [id number] DOB: [birth year]   Cancelled Treatment:    Reason Eval/Treat Not Completed: Medical issues which prohibited therapy.  Pt has recently had an episode of A-fib an RN is concerned about having pt moving around very much just yet.  She is not yet cognitively very responsive, so I will defer PT eval until the AM where we can, perhaps, get an accurate picture of this pt.   Konrad Penta 04/11/2012, 3:23 PM

## 2012-04-11 NOTE — Progress Notes (Signed)
Patient HR went up the 150's, Dr. Lendell Caprice informed EKG done and PO medication given. Noted AFIB on cardiac monitor. No complaints of pain from patient or heart racing.

## 2012-04-11 NOTE — Progress Notes (Signed)
Patient taken down for MRI  

## 2012-04-11 NOTE — Clinical Social Work Placement (Signed)
     Clinical Social Work Department CLINICAL SOCIAL WORK PLACEMENT NOTE 04/18/2012  Patient:  Ebony [name], Ebony [name]  Account Number:  [id number] Admit date:  04/09/2012  Clinical Social Worker:  Santa Genera, CLINICAL SOCIAL WORKER  Date/time:  04/11/2012 12:30 PM  Clinical Social Work is seeking post-discharge placement for this patient at the following level of care:   SKILLED NURSING   (*CSW will update this form in Epic as items are completed)   04/11/2012  Patient/family provided with [facility] Department of Clinical Social Works list of facilities offering this level of care within the geographic area requested by the patient (or if unable, by the patients family).  04/11/2012  Patient/family informed of their freedom to choose among providers that offer the needed level of care, that participate in Medicare, Medicaid or managed care program needed by the patient, have an available bed and are willing to accept the patient.  04/11/2012  Patient/family informed of [facility] ownership interest in [facility], as well as of the fact that they are under no obligation to receive care at this facility.  PASARR submitted to EDS on 04/11/2012 PASARR number received from EDS on   FL2 transmitted to all facilities in geographic area requested by pt/family on  04/11/2012 FL2 transmitted to all facilities within larger geographic area on   Patient informed that his/her managed care company has contracts with or will negotiate with  certain facilities, including the following:   SNf list given to family     Patient/family informed of bed offers received:  04/13/2012 Patient chooses bed at [facility] Physician recommends and patient chooses bed at    Patient to be transferred to [facility] on  04/16/2012 Patient to be transferred to facility by [facility]  The following physician request were entered in  Epic:   Additional Comments: Family requested placement in [place].

## 2012-04-11 NOTE — Clinical Social Work Psychosocial (Signed)
    Clinical Social Work Department BRIEF PSYCHOSOCIAL ASSESSMENT 04/11/2012  Patient:  Ebony [name], Ebony [name]     Account Number:  [id number]     Admit date:  04/09/2012  Clinical Social Worker:  Santa Genera, CLINICAL SOCIAL WORKER  Date/Time:  04/11/2012 09:00 AM  Referred by:  Physician  Date Referred:  04/11/2012 Referred for  SNF Placement   Other Referral:   Interview type:  Family Other interview type:   Patient currently not oriented and able to participate w assessment    PSYCHOSOCIAL DATA Living Status:  ALONE Admitted from facility:   Level of care:   Primary support name:  Waynetta Sandy, daughter Primary support relationship to patient:  CHILD, ADULT Degree of support available:   Daughters Buyer, retail and Darl Pikes are [facility] POAs.    CURRENT CONCERNS Current Concerns  Post-Acute Placement   Other Concerns:    SOCIAL WORK ASSESSMENT / PLAN CSW met w daughter, Waynetta Sandy, in room.  Patient is currently delerious and unable to fully participate in assessment. Family has planned for patient to move in w daughter Darl Pikes as soon as Darl Pikes completes purchase of home.  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  Family has been attempting to convince patient to move in w family for several years, but this is the first time patient has agreed.    Family anticipates that patient will need short term SNF rehab placement prior to moving in w daughter, and are requesting placement assistance. Wants placement in [facility] ([place]) and [place].    Patient is widowed x2, last husband died 2 years ago. Patient has good family support and prior to current episode of illness was quite independent living at home alone.    Explained SNF placement process to daughter, left SNF list and handbook.   Stressed need for insurance to approve of placement.   Assessment/plan status:  Psychosocial Support/Ongoing Assessment  of Needs Other assessment/ plan:   Information/referral to community resources:   [facility]    PATIENT'S/FAMILY'S RESPONSE TO PLAN OF CARE: Family appreciative of assistance.    Santa Genera, LCSW Clinical Social Worker [phone])

## 2012-04-11 NOTE — Progress Notes (Addendum)
Chart reviewed. Discussed with nursing staff.  Subjective: No complaints. Does not recall recent events. Per RN, required Haldol overnight for agitation and confusion.  Objective: Vital signs in last 24 hours: Filed Vitals:   04/11/12 0400 04/11/12 0500 04/11/12 0600 04/11/12 0700  BP: 151/67 145/66 139/66 141/70  Pulse:      Temp: 98.5 F (36.9 C)     TempSrc: Axillary     Resp: 18 19 19 19   Height:      Weight:  64.1 kg (141 lb 5 oz)    SpO2:       Weight change: 3.9 kg (8 lb 9.6 oz)  Intake/Output Summary (Last 24 hours) at 04/11/12 0825 Last data filed at 04/11/12 0600  Gross per 24 hour  Intake   2350 ml  Output    650 ml  Net   1700 ml   Gen.: Asleep. Arousable. Disoriented to place. Oriented to year and person. Answers questions appropriately. Cooperative. HEENT: Dry mucous membranes Lungs clear to auscultation bilaterally without wheezes rhonchi or rales Cardiovascular regular rate rhythm without murmurs gallops rubs Abdomen soft nontender nondistended Extremities: Sequential compression devices and TED hose in place. Skin: Bruising noted on left arm. Neurologic: No obvious cranial nerve deficits or focal motor deficits.  Lab Results: Basic Metabolic Panel:  Recent Labs Lab 04/09/12 2136 04/10/12 0458  NA 133* 136  K 2.8* 3.3*  CL 92* 98  CO2 27 24  GLUCOSE 118* 142*  BUN 12 9  CREATININE 0.73 0.62  CALCIUM 9.9 8.9  MG 1.4*  --    Liver Function Tests:  Recent Labs Lab 04/09/12 2136 04/10/12 0458  AST 53* 66*  ALT 32 33  ALKPHOS 148* 134*  BILITOT 0.8 0.7  PROT 7.3 6.8  ALBUMIN 4.1 3.6   No results found for this basename: LIPASE, AMYLASE,  in the last 168 hours No results found for this basename: AMMONIA,  in the last 168 hours CBC:  Recent Labs Lab 04/09/12 2136 04/10/12 0458  WBC 9.5 11.7*  NEUTROABS 7.4  --   HGB 13.2 12.9  HCT 38.4 38.2  MCV 84.0 84.5  PLT 225 230   Cardiac Enzymes:  Recent Labs Lab 04/09/12 2136   CKTOTAL 643*  TROPONINI <0.30   BNP: No results found for this basename: PROBNP,  in the last 168 hours D-Dimer: No results found for this basename: DDIMER,  in the last 168 hours CBG:  Recent Labs Lab 04/09/12 2125  GLUCAP 109*   Hemoglobin A1C: No results found for this basename: HGBA1C,  in the last 168 hours Fasting Lipid Panel: No results found for this basename: CHOL, HDL, LDLCALC, TRIG, CHOLHDL, LDLDIRECT,  in the last 168 hours Thyroid Function Tests:  Recent Labs Lab 04/10/12 0139  TSH 4.839*   Coagulation:  Recent Labs Lab 04/09/12 2136  LABPROT 12.7  INR 0.96   Anemia Panel: No results found for this basename: VITAMINB12, FOLATE, FERRITIN, TIBC, IRON, RETICCTPCT,  in the last 168 hours Urine Drug Screen: Drugs of Abuse     Component Value Date/Time   LABOPIA NONE DETECTED 04/10/2012 0041   COCAINSCRNUR NONE DETECTED 04/10/2012 0041   LABBENZ NONE DETECTED 04/10/2012 0041   AMPHETMU NONE DETECTED 04/10/2012 0041   THCU NONE DETECTED 04/10/2012 0041   LABBARB NONE DETECTED 04/10/2012 0041    Alcohol Level:  Recent Labs Lab 04/09/12 2136  ETH <11   Urinalysis:  Recent Labs Lab 04/09/12 2314  COLORURINE YELLOW  LABSPEC 1.010  PHURINE  7.5  GLUCOSEU NEGATIVE  HGBUR SMALL*  BILIRUBINUR NEGATIVE  KETONESUR TRACE*  PROTEINUR NEGATIVE  UROBILINOGEN 0.2  NITRITE NEGATIVE  LEUKOCYTESUR NEGATIVE   Micro Results: Recent Results (from the past 240 hour(s))  CULTURE, BLOOD (ROUTINE X 2)     Status: None   Collection Time    04/10/12  1:35 AM      Result Value Range Status   Specimen Description Blood RIGHT ANTECUBITAL   Final   Special Requests BOTTLES DRAWN AEROBIC AND ANAEROBIC 7CC   Final   Culture NO GROWTH <24 HRS   Final   Report Status PENDING   Incomplete  CULTURE, BLOOD (ROUTINE X 2)     Status: None   Collection Time    04/10/12  1:39 AM      Result Value Range Status   Specimen Description Blood BLOOD RIGHT ARM   Final   Special  Requests BOTTLES DRAWN AEROBIC AND ANAEROBIC 6CC   Final   Culture NO GROWTH <24 HRS   Final   Report Status PENDING   Incomplete  MRSA PCR SCREENING     Status: None   Collection Time    04/10/12  4:00 AM      Result Value Range Status   MRSA by PCR NEGATIVE  NEGATIVE Final   Comment:            The GeneXpert MRSA Assay (FDA     approved for NASAL specimens     only), is one component of a     comprehensive MRSA colonization     surveillance program. It is not     intended to diagnose MRSA     infection nor to guide or     monitor treatment for     MRSA infections.   Studies/Results: Ct Head Wo Contrast  04/09/2012  *RADIOLOGY REPORT*  Clinical Data:  The patient was found unresponsive at home.  Pain and agitation.  Altered mental status.  CT HEAD WITHOUT CONTRAST CT CERVICAL SPINE WITHOUT CONTRAST  Technique:  Multidetector CT imaging of the head and cervical spine was performed following the standard protocol without intravenous contrast.  Multiplanar CT image reconstructions of the cervical spine were also generated.  Comparison:  CT head 05/22/2011.  CT head and cervical spine 05/15/2011  CT HEAD  Findings: There is an acute subdural hematoma along the right anterior frontal region measuring 9.3 mm depth.  There is also minimal left parafalcine subdural hematoma anteriorly, measuring maximal depth of about 4 mm.  There is no mass effect or midline shift.  The no intraparenchymal, subarachnoid, or intraventricular hemorrhage is identified.  There is mild diffuse cerebral atrophy.  Ventricular dilatation consistent with central atrophy.  Gray-white matter junctions are distinct.  Basal cisterns are not effaced.  Low attenuation change in the periventricular white matter is consistent with small vessel ischemia.  No depressed skull fractures.  Visualized paranasal sinuses and mastoid air cells are not opacified.  Suggestion of nasal bone fractures, incompletely visualized.  These may be acute.   IMPRESSION: Acute subdural hematomas in the right anterior frontal and left anterior parafalcine regions.  No mass effect or midline shift.  CT CERVICAL SPINE  Findings: There is reversal of the usual cervical lordosis and anterior subluxation of about 3 mm at C4-5.  This appears stable since the previous study is likely due to degenerative change. There are degenerative changes diffusely throughout cervical spine with narrowed cervical interspaces assess endplate hypertrophic changes.  Degenerate changes throughout  the cervical facet joints. Lateral masses of C1 are symmetrical.  The odontoid process is intact.  No vertebral compression deformities.  No prevertebral soft tissue swelling.  No focal bone lesion or bone destruction. Bone cortex and trabecular architecture appear intact.  No abnormal paraspinal soft tissue infiltration.  IMPRESSION: Diffuse degenerative changes throughout the cervical spine.  Stable anterior subluxation of C4 on C5.  No acute displaced fractures identified.  Results were discussed by telephone with Dr. Bebe Shaggy at the 2230 hours on 04/09/2012.   Original Report Authenticated By: Burman Nieves, M.D.    Ct Cervical Spine Wo Contrast  04/09/2012  *RADIOLOGY REPORT*  Clinical Data:  The patient was found unresponsive at home.  Pain and agitation.  Altered mental status.  CT HEAD WITHOUT CONTRAST CT CERVICAL SPINE WITHOUT CONTRAST  Technique:  Multidetector CT imaging of the head and cervical spine was performed following the standard protocol without intravenous contrast.  Multiplanar CT image reconstructions of the cervical spine were also generated.  Comparison:  CT head 05/22/2011.  CT head and cervical spine 05/15/2011  CT HEAD  Findings: There is an acute subdural hematoma along the right anterior frontal region measuring 9.3 mm depth.  There is also minimal left parafalcine subdural hematoma anteriorly, measuring maximal depth of about 4 mm.  There is no mass effect or midline  shift.  The no intraparenchymal, subarachnoid, or intraventricular hemorrhage is identified.  There is mild diffuse cerebral atrophy.  Ventricular dilatation consistent with central atrophy.  Gray-white matter junctions are distinct.  Basal cisterns are not effaced.  Low attenuation change in the periventricular white matter is consistent with small vessel ischemia.  No depressed skull fractures.  Visualized paranasal sinuses and mastoid air cells are not opacified.  Suggestion of nasal bone fractures, incompletely visualized.  These may be acute.  IMPRESSION: Acute subdural hematomas in the right anterior frontal and left anterior parafalcine regions.  No mass effect or midline shift.  CT CERVICAL SPINE  Findings: There is reversal of the usual cervical lordosis and anterior subluxation of about 3 mm at C4-5.  This appears stable since the previous study is likely due to degenerative change. There are degenerative changes diffusely throughout cervical spine with narrowed cervical interspaces assess endplate hypertrophic changes.  Degenerate changes throughout the cervical facet joints. Lateral masses of C1 are symmetrical.  The odontoid process is intact.  No vertebral compression deformities.  No prevertebral soft tissue swelling.  No focal bone lesion or bone destruction. Bone cortex and trabecular architecture appear intact.  No abnormal paraspinal soft tissue infiltration.  IMPRESSION: Diffuse degenerative changes throughout the cervical spine.  Stable anterior subluxation of C4 on C5.  No acute displaced fractures identified.  Results were discussed by telephone with Dr. Bebe Shaggy at the 2230 hours on 04/09/2012.   Original Report Authenticated By: Burman Nieves, M.D.    Dg Chest Port 1 View  04/10/2012  *RADIOLOGY REPORT*  Clinical Data: Altered mental status.  PORTABLE CHEST - 1 VIEW  Comparison: None.  Findings: Normal heart size and pulmonary vascularity.  Central interstitial changes and peribronchial  thickening suggesting chronic bronchitis.  Linear atelectasis versus vascular crowding in the left lung base.  No focal consolidation or airspace disease. No blunting of costophrenic angles.  No pneumothorax.  Mediastinal contours appear intact.  Tortuous aorta.  Degenerative changes in the spine.  IMPRESSION: Chronic bronchitic changes.  No evidence of active consolidation.   Original Report Authenticated By: Burman Nieves, M.D.    Scheduled Meds: .  antiseptic oral rinse  15 mL Mouth Rinse q12n4p  . chlorhexidine  15 mL Mouth Rinse BID  . levofloxacin (LEVAQUIN) IV  500 mg Intravenous QHS  . metoprolol  5 mg Intravenous Q6H  . pantoprazole (PROTONIX) IV  40 mg Intravenous QHS   Continuous Infusions: . 0.9 % NaCl with KCl 20 mEq / L 75 mL/hr at 04/11/12 0600   PRN Meds:.acetaminophen, haloperidol lactate, hydrALAZINE, LORazepam, ondansetron (ZOFRAN) IV Assessment/Plan: Principal Problem:   Delirium: Resolving. Possibly benzodiazepine withdrawal, but likely multifactorial, subdural hematomas, traumatic subarachnoid hematomas, rule out ischemic stroke. Will have nursing do a swallow screen and started diet if she passes.  Resume home meds if able to take meds. Active Problems:   SDH (subdural hematoma), small, nonoperative management   HTN (hypertension) fairly well controlled   traumatic leg ulcer: No infection noted. Will discontinue antibiotics.   Dehydration: Continue IV fluids.  Hold diuretics   Fall   Anxiety disorder   Hypokalemia improving   Primary lactic acidemia   Abnormality of gait: PT evaluation. Will need placement or 24-hour supervision at the very least.   LOS: 2 days   Ashlynn Gunnels L  04/11/2012, 8:25 AM  Addendum: MRI shows stable areas of subdural hematoma. Also, 2-3 mm area of acute infarct in the right posterior superior temporal cortex. Subarachnoid and intraventricular hemorrhage without hydrocephalus. Will do an ischemic stroke workup and consult  neurology. Patient did not receive TPA, as she has the intracranial bleed. Also not a candidate for aspirin or Plavix at this time. Continue sequential compression devices.

## 2012-04-11 NOTE — Progress Notes (Signed)
Patient able to take thin liquids and able to swallow applesauce and pills with no signs of aspiration noted.

## 2012-04-11 NOTE — Progress Notes (Signed)
*  PRELIMINARY RESULTS* Echocardiogram 2D Echocardiogram has been performed.  Ebony [name] 04/11/2012, 12:05 PM

## 2012-04-11 NOTE — Progress Notes (Signed)
UR Chart Review Completed  

## 2012-04-12 ENCOUNTER — Inpatient Hospital Stay (HOSPITAL_COMMUNITY): Payer: Medicare Other

## 2012-04-12 DIAGNOSIS — M25531 Pain in right wrist: Secondary | ICD-10-CM | POA: Clinically undetermined

## 2012-04-12 DIAGNOSIS — M79609 Pain in unspecified limb: Secondary | ICD-10-CM

## 2012-04-12 DIAGNOSIS — I4891 Unspecified atrial fibrillation: Secondary | ICD-10-CM

## 2012-04-12 DIAGNOSIS — I6350 Cerebral infarction due to unspecified occlusion or stenosis of unspecified cerebral artery: Secondary | ICD-10-CM

## 2012-04-12 DIAGNOSIS — M79641 Pain in right hand: Secondary | ICD-10-CM | POA: Clinically undetermined

## 2012-04-12 LAB — LIPID PANEL
Cholesterol: 168 mg/dL (ref 0–200)
Total CHOL/HDL Ratio: 3.2 RATIO
VLDL: 18 mg/dL (ref 0–40)

## 2012-04-12 LAB — CBC
Hemoglobin: 10.7 g/dL — ABNORMAL LOW (ref 12.0–15.0)
MCH: 28.9 pg (ref 26.0–34.0)
MCHC: 33.2 g/dL (ref 30.0–36.0)
RDW: 15.7 % — ABNORMAL HIGH (ref 11.5–15.5)

## 2012-04-12 LAB — URINE MICROSCOPIC-ADD ON

## 2012-04-12 LAB — URINALYSIS, ROUTINE W REFLEX MICROSCOPIC
Leukocytes, UA: NEGATIVE
Specific Gravity, Urine: >1.03 — ABNORMAL HIGH (ref 1.005–1.030)
Urobilinogen, UA: 1 mg/dL (ref 0.0–1.0)

## 2012-04-12 LAB — BASIC METABOLIC PANEL
BUN: 10 mg/dL (ref 6–23)
Calcium: 8.6 mg/dL (ref 8.4–10.5)
GFR calc Af Amer: >90 mL/min (ref >90)
GFR calc non Af Amer: 86 mL/min — ABNORMAL LOW (ref >90)
Glucose, Bld: 92 mg/dL (ref 70–99)
Potassium: 3.2 mEq/L — ABNORMAL LOW (ref 3.5–5.1)
Sodium: 134 mEq/L — ABNORMAL LOW (ref 135–145)

## 2012-04-12 LAB — T3, FREE: T3, Free: 2.1 pg/mL — ABNORMAL LOW (ref 2.3–4.2)

## 2012-04-12 MED ORDER — NORTRIPTYLINE HCL 25 MG PO CAPS
ORAL_CAPSULE | ORAL | Status: AC
  Filled 2012-04-12: qty 2

## 2012-04-12 MED ORDER — POTASSIUM CHLORIDE 10 MEQ/100ML IV SOLN
10.0000 meq | INTRAVENOUS | Status: AC
  Administered 2012-04-12 (×2): 10 meq via INTRAVENOUS
  Filled 2012-04-12: qty 200

## 2012-04-12 NOTE — Progress Notes (Signed)
Over the past 24 hours, patient developed atrial fibrillation with rapid ventricular response. After given home Cardizem and Toprol, her atrial fibrillation converted. Patient past RN swallow screen but apparently failed RN swallow screen on admission. Records were requested from her primary care provider.  Subjective: No complaints. Does not recall recent events. Per RN, required Haldol overnight for agitation and confusion.  Objective: Vital signs in last 24 hours: Filed Vitals:   04/12/12 0400 04/12/12 0500 04/12/12 0600 04/12/12 0708  BP: 136/79 144/67 146/72 137/89  Pulse: 102 103  106  Temp: 99.4 F (37.4 C)     TempSrc: Axillary     Resp: 23 25 20    Height:      Weight:  65.4 kg (144 lb 2.9 oz)    SpO2: 99% 98%     Weight change: 1.3 kg (2 lb 13.9 oz)  Intake/Output Summary (Last 24 hours) at 04/12/12 0823 Last data filed at 04/12/12 0629  Gross per 24 hour  Intake 2486.25 ml  Output   1200 ml  Net 1286.25 ml   Telemetry: Normal sinus rhythm  Gen.: Alert. Remembers me.. Oriented to year and person and place. Answers questions appropriately. Cooperative. HEENT: Moist mucous membranes Lungs clear to auscultation bilaterally without wheezes rhonchi or rales Cardiovascular regular rate rhythm without murmurs gallops rubs Abdomen soft nontender nondistended Extremities: Sequential compression devices and TED hose in place. Right wrist and first and second MCP joints tender.  Right hand and wrist indurated Skin: Bruising noted on left arm. Neurologic: No cranial nerve deficits. Seems possibly to have some right-sided neglect. Will not cooperate with strength exam in the right arm due to pain in her wrist and hand, but is able to make a loose grip and lift her arm above her head. Wiggles toes but will not lift her legs off the bed due to pain.   Lab Results: Basic Metabolic Panel:  Recent Labs Lab 04/09/12 2136  04/11/12 0932 04/12/12 0532  NA 133*  < > 135 134*  K  2.8*  < > 3.7 3.2*  CL 92*  < > 101 98  CO2 27  < > 20 23  GLUCOSE 118*  < > 93 92  BUN 12  < > 10 10  CREATININE 0.73  < > 0.67 0.58  CALCIUM 9.9  < > 8.7 8.6  MG 1.4*  --   --   --   < > = values in this interval not displayed. Liver Function Tests:  Recent Labs Lab 04/09/12 2136 04/10/12 0458  AST 53* 66*  ALT 32 33  ALKPHOS 148* 134*  BILITOT 0.8 0.7  PROT 7.3 6.8  ALBUMIN 4.1 3.6   No results found for this basename: LIPASE, AMYLASE,  in the last 168 hours No results found for this basename: AMMONIA,  in the last 168 hours CBC:  Recent Labs Lab 04/09/12 2136 04/10/12 0458 04/12/12 0532  WBC 9.5 11.7* 10.5  NEUTROABS 7.4  --   --   HGB 13.2 12.9 10.7*  HCT 38.4 38.2 32.2*  MCV 84.0 84.5 87.0  PLT 225 230 196   Cardiac Enzymes:  Recent Labs Lab 04/09/12 2136  CKTOTAL 643*  TROPONINI <0.30   BNP: No results found for this basename: PROBNP,  in the last 168 hours D-Dimer: No results found for this basename: DDIMER,  in the last 168 hours CBG:  Recent Labs Lab 04/09/12 2125  GLUCAP 109*   Hemoglobin A1C: No results found for this basename: HGBA1C,  in the last 168 hours Fasting Lipid Panel:  Recent Labs Lab 04/12/12 0535  CHOL 168  HDL 52  LDLCALC 98  TRIG 89  CHOLHDL 3.2   Thyroid Function Tests:  Recent Labs Lab 04/10/12 0139  TSH 4.839*   Coagulation:  Recent Labs Lab 04/09/12 2136  LABPROT 12.7  INR 0.96   Anemia Panel: No results found for this basename: VITAMINB12, FOLATE, FERRITIN, TIBC, IRON, RETICCTPCT,  in the last 168 hours Urine Drug Screen: Drugs of Abuse     Component Value Date/Time   LABOPIA NONE DETECTED 04/10/2012 0041   COCAINSCRNUR NONE DETECTED 04/10/2012 0041   LABBENZ NONE DETECTED 04/10/2012 0041   AMPHETMU NONE DETECTED 04/10/2012 0041   THCU NONE DETECTED 04/10/2012 0041   LABBARB NONE DETECTED 04/10/2012 0041    Alcohol Level:  Recent Labs Lab 04/09/12 2136  ETH <11   EKG from yesterday shows a  atrial fibrillation with rapid ventricular response  Urinalysis:  Recent Labs Lab 04/09/12 2314  COLORURINE YELLOW  LABSPEC 1.010  PHURINE 7.5  GLUCOSEU NEGATIVE  HGBUR SMALL*  BILIRUBINUR NEGATIVE  KETONESUR TRACE*  PROTEINUR NEGATIVE  UROBILINOGEN 0.2  NITRITE NEGATIVE  LEUKOCYTESUR NEGATIVE   Micro Results: Recent Results (from the past 240 hour(s))  URINE CULTURE     Status: None   Collection Time    04/09/12 11:14 PM      Result Value Range Status   Specimen Description URINE, CLEAN CATCH   Final   Special Requests Normal   Final   Culture  Setup Time 04/10/2012 22:53   Final   Colony Count NO GROWTH   Final   Culture NO GROWTH   Final   Report Status 04/11/2012 FINAL   Final  CULTURE, BLOOD (ROUTINE X 2)     Status: None   Collection Time    04/10/12  1:35 AM      Result Value Range Status   Specimen Description BLOOD RIGHT ANTECUBITAL   Final   Special Requests BOTTLES DRAWN AEROBIC AND ANAEROBIC 7CC   Final   Culture NO GROWTH 1 DAY   Final   Report Status PENDING   Incomplete  CULTURE, BLOOD (ROUTINE X 2)     Status: None   Collection Time    04/10/12  1:39 AM      Result Value Range Status   Specimen Description BLOOD RIGHT ARM   Final   Special Requests BOTTLES DRAWN AEROBIC AND ANAEROBIC 6CC   Final   Culture NO GROWTH 1 DAY   Final   Report Status PENDING   Incomplete  MRSA PCR SCREENING     Status: None   Collection Time    04/10/12  4:00 AM      Result Value Range Status   MRSA by PCR NEGATIVE  NEGATIVE Final   Comment:            The GeneXpert MRSA Assay (FDA     approved for NASAL specimens     only), is one component of a     comprehensive MRSA colonization     surveillance program. It is not     intended to diagnose MRSA     infection nor to guide or     monitor treatment for     MRSA infections.   Studies/Results: Mr Sherrin Daisy Contrast  04/11/2012  *RADIOLOGY REPORT*  Clinical Data: Altered mental status. History of multiple falls,  with worsening mental status most recently.  MRI HEAD WITHOUT CONTRAST  Technique:  Multiplanar, multiecho pulse sequences of the brain and surrounding structures were obtained according to standard protocol without intravenous contrast.  Comparison: CT head 04/09/2012.  Also previous CT head 05/22/2011.  Findings: There is a tiny area of restricted diffusion in the right posterior superior temporal cortex deep within the post Sylvian fissure (image 13 series 3) consistent with a small acute infarct. No other areas of acute infarction are seen.  Multiple acute areas of intracranial hemorrhage are present as suspected from prior CT.  Over the right frontal convexity, there is an 8-10 mm thick acute subdural hematoma.  This hematoma is associated with what is likely a larger right subdural hygroma. Small amount of acute subdural hemorrhage is noted over the right occipital lobe posteriorly measuring no more than 2-3 mm thick.  A small amount of subdural hemorrhage can be seen over the left frontal convexity anteriorly (image 21 series 8), non compressive. Small amount of interhemispheric subdural hematoma is redemonstrated in the frontal region (image 20 series 8).  There is a moderate amount of intraventricular blood which layers posteriorly in the occipital horns (image 13 series 8).  There is subarachnoid hemorrhage, best seen on FLAIR sequences, which can be seen in the prepontine cistern and fourth ventricle outlet foramina.  There are no areas of parenchymal hemorrhage.  There is moderate atrophy without midline shift.  Extensive white matter signal abnormality representing chronic microvascular ischemic change can be seen in the periventricular and subcortical regions.  The calvarium is intact.  There is no visible skull fracture. Normal pituitary and cerebellar tonsils.  Upper cervical region unremarkable.  No acute sinus or mastoid disease.  IMPRESSION: Previously identified areas of subdural hematoma and  hygroma are roughly stable compared with prior CT. This is most prominent in the right frontal region.  There is no significant midline shift or significant brain compression.  Small area of acute infarction no more than 2-3 mm in diameter affects the right posterior superior temporal cortex deep in the posterior Sylvian fissure.  Subarachnoid and intraventricular hemorrhage without hydrocephalus.  Atrophy with chronic microvascular ischemic change.   Original Report Authenticated By: Davonna Belling, M.D.    US Carotid Duplex Bilateral  04/11/2012  *RADIOLOGY REPORT*  Clinical Data: History of stroke, hypertension, altered mental status  BILATERAL CAROTID DUPLEX ULTRASOUND  Technique: Gray scale imaging, color Doppler and duplex ultrasound was performed of bilateral carotid and vertebral arteries in the neck.  Comparison:  Brain MRI - earlier same day  Criteria:  Quantification of carotid stenosis is based on velocity parameters that correlate the residual internal carotid diameter with NASCET-based stenosis levels, using the diameter of the distal internal carotid lumen as the denominator for stenosis measurement.  The following velocity measurements were obtained:                   PEAK SYSTOLIC/END DIASTOLIC RIGHT ICA:                        115/38cm/sec CCA:                        98/22cm/sec SYSTOLIC ICA/CCA RATIO:     1.1 a DIASTOLIC ICA/CCA RATIO:    1.74 ECA:                        73cm/sec  LEFT ICA:  111/10cm/sec CCA:                        101/22cm/sec SYSTOLIC ICA/CCA RATIO:     1.09 DIASTOLIC ICA/CCA RATIO:    0.45 ECA:                        111cm/sec  Findings:  RIGHT CAROTID ARTERY: There is no gray-scale evidence of significant atherosclerotic plaque within the right carotid system. No elevated peak systolic velocity with the right internal carotid artery to suggest hemodynamically significant stenosis.  RIGHT VERTEBRAL ARTERY:  Antegrade flow  LEFT CAROTID ARTERY: There is no  gray-scale evidence of significant atherosclerotic plaque of the left carotid system. Isolated borderline elevated peak systolic velocity within the mid aspect of the left internal carotid artery is felt to be factitiously elevated due to sampling error.  Non-elevated peak systolic velocities are seen within the proximal and distal aspects of the left internal carotid artery.  LEFT VERTEBRAL ARTERY:  Antegrade flow  IMPRESSION:  Unremarkable carotid Doppler ultrasound without evidence of significant atherosclerotic plaque within either carotid system or definitive evidence of hemodynamically significant stenosis.   Original Report Authenticated By: Tacey Ruiz, MD    Scheduled Meds: . ALPRAZolam  0.5 mg Oral QHS  . antiseptic oral rinse  15 mL Mouth Rinse q12n4p  . chlorhexidine  15 mL Mouth Rinse BID  . diltiazem  120 mg Oral Daily  . metoprolol succinate  100 mg Oral [id number]  . nortriptyline  50 mg Oral QHS  . potassium chloride  10 mEq Intravenous Q1 Hr x 2  . sertraline  100 mg Oral q morning - 10a   Continuous Infusions: . 0.9 % NaCl with KCl 20 mEq / L 75 mL/hr at 04/12/12 0629   PRN Meds:.acetaminophen, acetaminophen, haloperidol lactate, hydrALAZINE, LORazepam, ondansetron (ZOFRAN) IV Assessment/Plan: Principal Problem:   Delirium: Resolving. Possibly benzodiazepine withdrawal, but likely multifactorial, subdural hematomas, traumatic subarachnoid hematomas, small ischemic stroke. Advance diet. Speech therapy, occupational therapy, neurology consults pending. Carotid Dopplers show no critical ischemia. Echocardiogram and fasting lipid panel pending.  Active Problems: Ischemic stroke: Difficult to assess for weakness, as patient is not completely cooperative with exam due to pain. Not a candidate for TPA or anticoagulation or antiplatelets at this time due to intracranial hemorrhage.   Traumatic SDH (subdural hematoma), small, nonoperative management: Unclear how much of this is old and if  there is a new component. Patient had a traumatic subdural hematoma and subarachnoid hemorrhage a year ago as well after falling to Traumatic subarachnoid hemorrhage with intraventricular blood.   HTN (hypertension) controlled. Patient started on home medications yesterday   traumatic leg ulcer: No infection noted.  Atrial fibrillation transient: Unclear whether this is new or old. Not a candidate for anticoagulation. Due to falls and intracranial bleed. Awaiting records from her primary care provider. Continue telemetry, Cardizem and Toprol-XL   Dehydration:   Hold diuretics   Frequent Falls, noncompliant with walker. Will no longer be safe to live alone. Family is interested in short-term skilled nursing facility and then she will be staying with her daughter. Right hand and wrist pain: Will x-ray hand and wrist.   Anxiety disorder   Hypokalemia continue repletion  Discussed with Dr. Gerilyn Pilgrim. Will transfer to telemetry.   LOS: 3 days   Tinia Oravec L  04/12/2012, 8:23 AM

## 2012-04-12 NOTE — Clinical Social Work Note (Signed)
Daughters in room given bed offers ([address] [facility] [place] and [address] [place], [place], [place] and [place]).  Daughters planning to visit [address] [facility] [place] and [place] this afternoon due to their location.  Will make decision on bed offers tomorrow morning.    Santa Genera, LCSW Clinical Social Worker [phone])

## 2012-04-12 NOTE — Plan of Care (Signed)
Problem: Phase I Progression Outcomes Goal: Pain controlled with appropriate interventions Outcome: Completed/Met Date Met:  04/12/12 No complaints of pain Goal: OOB as tolerated unless otherwise ordered Outcome: Not Progressing Bedrest at present

## 2012-04-12 NOTE — Consult Note (Signed)
[facility] Lylianna Fraiser A. Gerilyn Pilgrim, MD     [email or web address]          Ebony [name] is an 79 y.o. female.   ASSESSMENT/PLAN: Multifactorial gait impairment. Contributing conditions includes aging, chronic ischemic white matter changes and osteoarthritis. The examination does not indicate any underlying treatable neurological causes. The patient does seem to have had the atrial fibrillation on presentation which undoubtedly is also contribute to her gait impairment at least this current bout of falling.   Acute confusional state due to integral hemorrhage and acute illness. Consider obtaining an EEG as patient was found unresponsive.   Intraventricular hemorrhage along with subarachnoid hemorrhage. History of subdural hematoma which is chronic. The patient seemed to have developed intraventricular hemorrhage on the MRI scan that was not seen on the initial CT scan. This is concerning. I would therefore recommend repeating imaging with suggest a CT scan 48 hours from now or earlier if clinical situation deteriorates.  Tiny right parietal infarct likely of insignificant clinical consequence to the current situation. The patient long-term can be placed on antiplatelet agents, but I would not recommend that this be initiated for at least 4 weeks from now.  Atrial fibrillation. However, she is not a candidate for chronic long-term anticoagulation given her current falls and intracranial hemorrhage.  The patient is a 79 year old white female who has a long-standing history of multiple falls resulting in multiple bouts of subdural hemorrhage. The patient however has been very independent and highly functional. She lives by herself without cognitive impairment and usually walks around fairly well although she has bouts of stumbling and falling. She does have a walker but apparently has not been consistent in using this. The patient daughter tells me that they cannot get a hold the patient  got concerned. She will not come to the phone. They did go looking for the patient and she was found unresponsive on the floor. The patient has no recollection as to why she fell to the ground. She is amnestic to the event. The patient complains of pain all over. She has been somewhat confused since being hospitalized. She has weakness in all 4 extremities mostly due to severe pain. The patient was noted to be atrial fibrillation new onset during the initial evaluation but may have actually come out of atrial fibrillation. Review of systems is unremarkable other than as stated in the history of present illness and is also limited given the mild cognitive impairment.  GENERAL: This is a pleasant thin lady who appears to be in significant discomfort and pain throughout. 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  HEENT: Supple. Atraumatic normocephalic.   ABDOMEN: soft  EXTREMITIES: There is mild edema of the upper and lower extremities. There is significant bruising noted of the upper and lower extremities.  BACK: Normal.  SKIN: Normal by inspection.    MENTAL STATUS: The patient is awake and alert. 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 She is oriented to person, place year and the month. Insight is somewhat limited however.  CRANIAL NERVES: Pupils are equal, round and reactive to light and accomodation; extra ocular movements are full, there is no significant nystagmus; visual fields are full; upper and lower facial muscles are normal in strength and symmetric, there is no flattening of the nasolabial folds; tongue is midline; uvula is midline; shoulder elevation is normal.  MOTOR: There is global weakness throughout. The upper extremities are 4 minus/5 in the lower extremities 3/5. Tone and bulk are normal throughout however.  COORDINATION: Left finger to nose is normal, right finger to nose is normal, No rest tremor; no intention tremor; no postural tremor; no bradykinesia.  REFLEXES: Deep  tendon reflexes are symmetrical and normal. Babinski reflexes are flexor bilaterally.   SENSATION: This is normal to light touch and pain.    Past Medical History  Diagnosis Date  . Hypertension     Past Surgical History  Procedure Laterality Date  . Abdominal hysterectomy    . Breast surgery    . Tonsillectomy    . Eye surgery      Family History  Problem Relation Age of Onset  . CVA Neg Hx   . Diabetes Neg Hx   . Hypertension Neg Hx   . Lung cancer Sister     Social History:  reports that she has never smoked. She does not have any smokeless tobacco history on file. She reports that she does not drink alcohol or use illicit drugs.  Allergies:  Allergies  Allergen Reactions  . Penicillins Diarrhea  . Sulfa Antibiotics Diarrhea    Medications:  Prior to Admission medications   Medication Sig Start Date End Date Taking? Authorizing Provider  ALPRAZolam Prudy Feeler) 0.5 MG tablet Take 0.5 mg by mouth 2 (two) times daily as needed. For anxiety     Historical Provider, MD  diltiazem (CARDIZEM CD) 120 MG 24 hr capsule Take 120 mg by mouth daily.    Historical Provider, MD  metoprolol (TOPROL-XL) 100 MG 24 hr tablet Take 100 mg by mouth every morning.      Historical Provider, MD  Multiple Vitamin (MULITIVITAMIN WITH MINERALS) TABS Take 1 tablet by mouth every morning.    Historical Provider, MD  nortriptyline (PAMELOR) 50 MG capsule Take 50 mg by mouth at bedtime.      Historical Provider, MD  sertraline (ZOLOFT) 100 MG tablet Take 100 mg by mouth every morning.     Historical Provider, MD  triamterene-hydrochlorothiazide (DYAZIDE) 37.5-25 MG per capsule Take 1 capsule by mouth every morning.    Historical Provider, MD    Scheduled Meds: . ALPRAZolam  0.5 mg Oral QHS  . antiseptic oral rinse  15 mL Mouth Rinse q12n4p  . chlorhexidine  15 mL Mouth Rinse BID  . diltiazem  120 mg Oral Daily  . metoprolol succinate  100 mg Oral BH-q7a  . nortriptyline  50 mg Oral QHS  .  potassium chloride  10 mEq Intravenous Q1 Hr x 2  . sertraline  100 mg Oral q morning - 10a   Continuous Infusions:  PRN Meds:.acetaminophen, haloperidol lactate, hydrALAZINE, LORazepam, ondansetron (ZOFRAN) IV   Blood pressure 137/89, pulse 106, temperature 99.4 F (37.4 C), temperature source Axillary, resp. rate 20, height 5\' 5"  (1.651 m), weight 65.4 kg (144 lb 2.9 oz), SpO2 98.00%.   Results for orders placed during the hospital encounter of 04/09/12 (from the past 48 hour(s))  BASIC METABOLIC PANEL     Status: Abnormal   Collection Time    04/11/12  9:32 AM      Result Value Range   Sodium 135  135 - 145 mEq/L   Potassium 3.7  3.5 - 5.1 mEq/L   Chloride 101  96 - 112 mEq/L   CO2 20  19 - 32 mEq/L   Glucose, Bld 93  70 - 99 mg/dL   BUN 10  6 - 23 mg/dL   Creatinine, Ser 1.61  0.50 - 1.10 mg/dL   Calcium 8.7  8.4 - 09.6 mg/dL  GFR calc non Af Amer 82 (*) >90 mL/min   GFR calc Af Amer >90  >90 mL/min   Comment:            The eGFR has been calculated     using the CKD EPI equation.     This calculation has not been     validated in all clinical     situations.     eGFR's persistently     <90 mL/min signify     possible Chronic Kidney Disease.  CBC     Status: Abnormal   Collection Time    04/12/12  5:32 AM      Result Value Range   WBC 10.5  4.0 - 10.5 K/uL   RBC 3.70 (*) 3.87 - 5.11 MIL/uL   Hemoglobin 10.7 (*) 12.0 - 15.0 g/dL   HCT 16.1 (*) 09.6 - 04.5 %   MCV 87.0  78.0 - 100.0 fL   MCH 28.9  26.0 - 34.0 pg   MCHC 33.2  30.0 - 36.0 g/dL   RDW 40.9 (*) 81.1 - 91.4 %   Platelets 196  150 - 400 K/uL  BASIC METABOLIC PANEL     Status: Abnormal   Collection Time    04/12/12  5:32 AM      Result Value Range   Sodium 134 (*) 135 - 145 mEq/L   Potassium 3.2 (*) 3.5 - 5.1 mEq/L   Chloride 98  96 - 112 mEq/L   CO2 23  19 - 32 mEq/L   Glucose, Bld 92  70 - 99 mg/dL   BUN 10  6 - 23 mg/dL   Creatinine, Ser 7.82  0.50 - 1.10 mg/dL   Calcium 8.6  8.4 - 95.6 mg/dL     GFR calc non Af Amer 86 (*) >90 mL/min   GFR calc Af Amer >90  >90 mL/min   Comment:            The eGFR has been calculated     using the CKD EPI equation.     This calculation has not been     validated in all clinical     situations.     eGFR's persistently     <90 mL/min signify     possible Chronic Kidney Disease.  LIPID PANEL     Status: None   Collection Time    04/12/12  5:35 AM      Result Value Range   Cholesterol 168  0 - 200 mg/dL   Triglycerides 89  <213 mg/dL   HDL 52  >08 mg/dL   Total CHOL/HDL Ratio 3.2     VLDL 18  0 - 40 mg/dL   LDL Cholesterol 98  0 - 99 mg/dL   Comment:            Total Cholesterol/HDL:CHD Risk     Coronary Heart Disease Risk Table                         Men   Women      1/2 Average Risk   3.4   3.3      Average Risk       5.0   4.4      2 X Average Risk   9.6   7.1      3 X Average Risk  23.4   11.0  Use the calculated Patient Ratio     above and the CHD Risk Table     to determine the patient's CHD Risk.                ATP III CLASSIFICATION (LDL):      <100     mg/dL   Optimal      478-295  mg/dL   Near or Above                        Optimal      130-159  mg/dL   Borderline      621-308  mg/dL   High      >657     mg/dL   Very High    Mr Brain Wo Contrast  04/11/2012  *RADIOLOGY REPORT*  Clinical Data: Altered mental status. History of multiple falls, with worsening mental status most recently.  MRI HEAD WITHOUT CONTRAST  Technique:  Multiplanar, multiecho pulse sequences of the brain and surrounding structures were obtained according to standard protocol without intravenous contrast.  Comparison: CT head 04/09/2012.  Also previous CT head 05/22/2011.  Findings: There is a tiny area of restricted diffusion in the right posterior superior temporal cortex deep within the post Sylvian fissure (image 13 series 3) consistent with a small acute infarct. No other areas of acute infarction are seen.  Multiple acute areas  of intracranial hemorrhage are present as suspected from prior CT.  Over the right frontal convexity, there is an 8-10 mm thick acute subdural hematoma.  This hematoma is associated with what is likely a larger right subdural hygroma. Small amount of acute subdural hemorrhage is noted over the right occipital lobe posteriorly measuring no more than 2-3 mm thick.  A small amount of subdural hemorrhage can be seen over the left frontal convexity anteriorly (image 21 series 8), non compressive. Small amount of interhemispheric subdural hematoma is redemonstrated in the frontal region (image 20 series 8).  There is a moderate amount of intraventricular blood which layers posteriorly in the occipital horns (image 13 series 8).  There is subarachnoid hemorrhage, best seen on FLAIR sequences, which can be seen in the prepontine cistern and fourth ventricle outlet foramina.  There are no areas of parenchymal hemorrhage.  There is moderate atrophy without midline shift.  Extensive white matter signal abnormality representing chronic microvascular ischemic change can be seen in the periventricular and subcortical regions.  The calvarium is intact.  There is no visible skull fracture. Normal pituitary and cerebellar tonsils.  Upper cervical region unremarkable.  No acute sinus or mastoid disease.  IMPRESSION: Previously identified areas of subdural hematoma and hygroma are roughly stable compared with prior CT. This is most prominent in the right frontal region.  There is no significant midline shift or significant brain compression.  Small area of acute infarction no more than 2-3 mm in diameter affects the right posterior superior temporal cortex deep in the posterior Sylvian fissure.  Subarachnoid and intraventricular hemorrhage without hydrocephalus.  Atrophy with chronic microvascular ischemic change.   Original Report Authenticated By: Davonna Belling, M.D.    US Carotid Duplex Bilateral  04/11/2012  *RADIOLOGY REPORT*   Clinical Data: History of stroke, hypertension, altered mental status  BILATERAL CAROTID DUPLEX ULTRASOUND  Technique: Gray scale imaging, color Doppler and duplex ultrasound was performed of bilateral carotid and vertebral arteries in the neck.  Comparison:  Brain MRI - earlier same day  Criteria:  Quantification of carotid  stenosis is based on velocity parameters that correlate the residual internal carotid diameter with NASCET-based stenosis levels, using the diameter of the distal internal carotid lumen as the denominator for stenosis measurement.  The following velocity measurements were obtained:                   PEAK SYSTOLIC/END DIASTOLIC RIGHT ICA:                        115/38cm/sec CCA:                        98/22cm/sec SYSTOLIC ICA/CCA RATIO:     1.1 a DIASTOLIC ICA/CCA RATIO:    1.74 ECA:                        73cm/sec  LEFT ICA:                        111/10cm/sec CCA:                        101/22cm/sec SYSTOLIC ICA/CCA RATIO:     1.09 DIASTOLIC ICA/CCA RATIO:    0.45 ECA:                        111cm/sec  Findings:  RIGHT CAROTID ARTERY: There is no gray-scale evidence of significant atherosclerotic plaque within the right carotid system. No elevated peak systolic velocity with the right internal carotid artery to suggest hemodynamically significant stenosis.  RIGHT VERTEBRAL ARTERY:  Antegrade flow  LEFT CAROTID ARTERY: There is no gray-scale evidence of significant atherosclerotic plaque of the left carotid system. Isolated borderline elevated peak systolic velocity within the mid aspect of the left internal carotid artery is felt to be factitiously elevated due to sampling error.  Non-elevated peak systolic velocities are seen within the proximal and distal aspects of the left internal carotid artery.  LEFT VERTEBRAL ARTERY:  Antegrade flow  IMPRESSION:  Unremarkable carotid Doppler ultrasound without evidence of significant atherosclerotic plaque within either carotid system or definitive  evidence of hemodynamically significant stenosis.   Original Report Authenticated By: Tacey Ruiz, MD         Svea Pusch A. Gerilyn Pilgrim, M.D.  Diplomate, Biomedical engineer of Psychiatry and Neurology ( Neurology). 04/12/2012, 9:10 AM

## 2012-04-12 NOTE — Care Management Note (Signed)
    Page 1 of 1   04/12/2012     2:19:00 PM   CARE MANAGEMENT NOTE 04/12/2012  Patient:  Ebony [name], Ebony [name]   Account Number:  [id number]  Date Initiated:  04/12/2012  Documentation initiated by:  Rosemary Holms  Subjective/Objective Assessment:   Pt admitted from home and was planning on moving in with a daughter. Plans currently are to DC to SNF. CSW working with family regarding DC.     Action/Plan:   Anticipated DC Date:  04/14/2012   Anticipated DC Plan:  SKILLED NURSING FACILITY  In-house referral  Clinical Social Worker      DC Planning Services  CM consult      Choice offered to / List presented to:             Status of service:  Completed, signed off Medicare Important Message given?   (If response is "NO", the following Medicare IM given date fields will be blank) Date Medicare IM given:   Date Additional Medicare IM given:    Discharge Disposition:    Per UR Regulation:    If discussed at Long Length of Stay Meetings, dates discussed:    Comments:  04/12/12 Rosemary Holms RN BSN CM

## 2012-04-12 NOTE — Progress Notes (Signed)
Patient had fever of 101 - Tylenol given PRN.  MD notified, orders given.  Report called to [place], RN on 300 and patient transferred to 329 via bed.  Reported recent fever to nurse.  Dr. Cathlyn Parsons office has also received request for med records to be sent.

## 2012-04-12 NOTE — Evaluation (Signed)
Physical Therapy Evaluation Patient Details Name: Ebony [name] MRN: [id number] DOB: [birth year] Today's Date: 04/12/2012 Time: 4098-1191 PT Time Calculation (min): 50 min  PT Assessment / Plan / Recommendation Clinical Impression  Pt was seen for eval.  She was quite drowsy, but with stimulation she was able to stay awake.   Pt is oriented to person only but does have recall of prior living setting.  She lives alone and had been independent with ADLs, although had frequent falls.  She has severe end stage DJD of both knees and all mobillity is currently compromised because of this..She describes generalized pain with any movement and muscle testing was difficult because of this.  She required total assist to assume the sitting position, however was able to maintain the seated position independently. She was unable to tolerate any weight bearing on her LEs due to knee pain.  She will need to transfer to SNF at the time of d/c.         PT Assessment  Patient needs continued PT services    Follow Up Recommendations  SNF    Does the patient have the potential to tolerate intense rehabilitation      Barriers to Discharge Decreased caregiver support      Equipment Recommendations  None recommended by PT    Recommendations for Other Services     Frequency Min 3X/week    Precautions / Restrictions Precautions Precautions: Fall Restrictions Weight Bearing Restrictions: No   Pertinent Vitals/Pain       Mobility  Bed Mobility Bed Mobility: Rolling Right;Rolling Left;Supine to Sit;Sit to Supine Rolling Right: 1: +1 Total assist Rolling Left: 1: +1 Total assist Supine to Sit: 1: +2 Total assist Supine to Sit: Patient Percentage: 0% Sit to Supine: 1: +1 Total assist Transfers Transfers: Sit to Stand Sit to Stand: 1: +2 Total assist Sit to Stand: Patient Percentage: 0% Details for Transfer Assistance: pt is unable to weight bear on either LE Ambulation/Gait Ambulation/Gait  Assistance: Not tested (comment)    Exercises     PT Diagnosis: Difficulty walking;Generalized weakness;Acute pain  PT Problem List: Decreased strength;Decreased range of motion;Decreased activity tolerance;Decreased mobility;Decreased cognition;Decreased safety awareness;Pain PT Treatment Interventions: DME instruction;Functional mobility training;Therapeutic activities;Therapeutic exercise;Patient/family education   PT Goals Acute Rehab PT Goals PT Goal Formulation: With patient Time For Goal Achievement: 04/26/12 Potential to Achieve Goals: Fair Pt will Roll Supine to Right Side: with max assist PT Goal: Rolling Supine to Right Side - Progress: Goal set today Pt will Roll Supine to Left Side: with max assist PT Goal: Rolling Supine to Left Side - Progress: Goal set today Pt will go Supine/Side to Sit: with max assist;with HOB not 0 degrees (comment degree) PT Goal: Supine/Side to Sit - Progress: Goal set today Pt will Transfer Bed to Chair/Chair to Bed: with max assist PT Transfer Goal: Bed to Chair/Chair to Bed - Progress: Goal set today  Visit Information  Last PT Received On: 04/12/12 Assistance Needed: +2 PT/OT Co-Evaluation/Treatment: Yes    Subjective Data  Subjective: very sleepy Patient Stated Goal: return home   Prior Functioning  Home Living Lives With: Alone Type of Home: House Home Access: Ramped entrance Home Layout: One level Bathroom Shower/Tub: Engineer, manufacturing systems: Standard Home Adaptive Equipment: Walker - rolling;Tub transfer bench;Raised toilet seat with rails;Grab bars around toilet Additional Comments: Life Alert Prior Function Level of Independence: Independent with assistive device(s) Able to Take Stairs?: No Driving: Yes Vocation: Retired Musician: No difficulties  Cognition  Cognition Overall Cognitive Status: Impaired Area of Impairment: Safety/judgement;Awareness of deficits Arousal/Alertness:  Lethargic Orientation Level: Disoriented X4;Place;Time;Situation Behavior During Session: Lethargic    Extremity/Trunk Assessment Right Lower Extremity Assessment RLE ROM/Strength/Tone: Deficits RLE ROM/Strength/Tone Deficits: mild clonus in R ankle noted...pt has severe DJD in knee with limited knee flexion (to about 60 deg with pain)...2-/5 strength at hip, PF...unable to eval knee RLE Sensation: WFL - Light Touch Left Lower Extremity Assessment LLE ROM/Strength/Tone: Deficits LLE ROM/Strength/Tone Deficits: no increased tone but has severe DJD of knee...strength as noted in R knee LLE Sensation: WFL - Light Touch Trunk Assessment Trunk Assessment: Kyphotic   Balance Balance Balance Assessed: Yes Static Sitting Balance Static Sitting - Balance Support: No upper extremity supported;Feet supported Static Sitting - Level of Assistance: 5: Stand by assistance Dynamic Sitting Balance Dynamic Sitting - Balance Support: No upper extremity supported;Feet supported Dynamic Sitting - Level of Assistance: 5: Stand by assistance  End of Session PT - End of Session Equipment Utilized During Treatment: Gait belt Activity Tolerance: Patient limited by fatigue Patient left: in chair;with call bell/phone within reach;with family/visitor present Nurse Communication: Mobility status;Need for lift equipment  GP     Konrad Penta 04/12/2012, 10:02 AM

## 2012-04-12 NOTE — Evaluation (Signed)
Occupational Therapy Evaluation Patient Details Name: Ebony [name] MRN: [id number] DOB: [birth year] Today's Date: 04/12/2012 Time: 7846-9629 OT Time Calculation (min): 52 min  OT Assessment / Plan / Recommendation Clinical Impression  Patient is a 79 y/o female s/p Delirium presenting to acute OT with deficits below. Patient will benefit from OT services to increase Bil UE strength and endurance, ADL performance, and functional transfers. Recommend SNF at D/C.    OT Assessment  Patient needs continued OT Services    Follow Up Recommendations  SNF       Equipment Recommendations  3 in 1 bedside comode       Frequency  Min 2X/week    Precautions / Restrictions Precautions Precautions: Fall Restrictions Weight Bearing Restrictions: No   Pertinent Vitals/Pain Grimancing with movement of right wrist.    ADL  Lower Body Dressing: Performed;+1 Total assistance Where Assessed - Lower Body Dressing: Supine, head of bed up Toilet Transfer: Performed;+1 Total assistance Toilet Transfer Method: Other (comment) (maxi sky lift) Transfers/Ambulation Related to ADLs: Patient transfered to recliner using maxi sky lift.    OT Diagnosis: Generalized weakness;Acute pain  OT Problem List: Decreased strength;Decreased range of motion;Decreased activity tolerance;Impaired balance (sitting and/or standing);Impaired UE functional use;Impaired tone;Decreased cognition;Decreased coordination;Pain OT Treatment Interventions: Self-care/ADL training;Therapeutic activities;Therapeutic exercise;DME and/or AE instruction;Modalities;Splinting;Balance training;Patient/family education   OT Goals Acute Rehab OT Goals OT Goal Formulation: With patient Time For Goal Achievement: 04/26/12 Potential to Achieve Goals: Fair ADL Goals Pt Will Perform Eating: with set-up;Sitting, edge of bed;Unsupported ADL Goal: Eating - Progress: Goal set today Pt Will Perform Grooming: with min assist;Sitting, edge of  bed;Unsupported ADL Goal: Grooming - Progress: Goal set today Pt Will Perform Upper Body Bathing: with min assist;Sitting, edge of bed ADL Goal: Upper Body Bathing - Progress: Goal set today Pt Will Perform Lower Body Bathing: with max assist;Supine, rolling right and/or left ADL Goal: Lower Body Bathing - Progress: Goal set today Pt Will Perform Upper Body Dressing: with min assist;Sitting, bed ADL Goal: Upper Body Dressing - Progress: Goal set today Pt Will Perform Lower Body Dressing: with max assist;Supine, rolling right and/or left ADL Goal: Lower Body Dressing - Progress: Goal set today Pt Will Transfer to Toilet: with max assist;Stand pivot transfer;3-in-1 ADL Goal: Toilet Transfer - Progress: Goal set today Arm Goals Pt Will Perform AROM: with minimal assist;to maintain range of motion;Bilateral upper extremities;10 reps;1 set;Other (comment) (to increase strength) Arm Goal: AROM - Progress: Goal set today  Visit Information  Last OT Received On: 04/12/12 Assistance Needed: +2 PT/OT Co-Evaluation/Treatment: Yes    Subjective Data  Subjective: " I can move my arms fine. It's my legs that are hurting." (Although, patient is unable to move arms functionally when asked.) Patient Stated Goal: None stated.   Prior Functioning     Home Living Lives With: Alone Type of Home: House Home Access: Ramped entrance Home Layout: One level Bathroom Shower/Tub: Engineer, manufacturing systems: Standard Home Adaptive Equipment: Walker - rolling;Tub transfer bench;Raised toilet seat with rails;Grab bars around toilet Additional Comments: Life Alert Prior Function Level of Independence: Independent with assistive device(s) Able to Take Stairs?: No Driving: Yes Vocation: Retired Musician: No difficulties Dominant Hand: Right         Vision/Perception Vision - History Baseline Vision: No visual deficits Patient Visual Report: No change from baseline    Cognition  Cognition Overall Cognitive Status: Impaired Area of Impairment: Safety/judgement;Awareness of deficits Arousal/Alertness: Lethargic Orientation Level: Disoriented to;Place;Time;Situation Behavior During Session: Lethargic  Extremity/Trunk Assessment Right Upper Extremity Assessment RUE ROM/Strength/Tone: Deficits RUE ROM/Strength/Tone Deficits: Edema noted in RUE. MMT: 3-/5 (shoulder flexion, elbow flexion) 0/5 (wrist flexion/extension) due to pain with passive movement. Slight increase of tone noted in RUE. RUE Coordination: Deficits RUE Coordination Deficits: Impaired fine/gross motor coordination Left Upper Extremity Assessment LUE ROM/Strength/Tone: Deficits LUE ROM/Strength/Tone Deficits: MMT: 3/5 in all ranges. Very weak and barely able to tolerated activity. LUE Coordination: Deficits LUE Coordination Deficits: impaired fine/gross motor coordination     Mobility Bed Mobility Bed Mobility: Rolling Right;Rolling Left;Supine to Sit;Sit to Supine Rolling Right: 1: +1 Total assist Rolling Left: 1: +1 Total assist Supine to Sit: 1: +2 Total assist Supine to Sit: Patient Percentage: 0% Sit to Supine: 1: +1 Total assist Transfers Sit to Stand: 1: +2 Total assist Sit to Stand: Patient Percentage: 0% Details for Transfer Assistance: pt is unable to weight bear on either LE        Balance Balance Balance Assessed: Yes Static Sitting Balance Static Sitting - Balance Support: No upper extremity supported;Feet supported Static Sitting - Level of Assistance: 5: Stand by assistance Dynamic Sitting Balance Dynamic Sitting - Balance Support: No upper extremity supported;Feet supported Dynamic Sitting - Level of Assistance: 5: Stand by assistance   End of Session OT - End of Session Activity Tolerance: Patient limited by fatigue Patient left: in chair;with call bell/phone within reach;with nursing in room;with family/visitor present Nurse Communication: Need for  lift equipment;Mobility status    Limmie Patricia, OTR/L  04/12/2012, 10:42 AM

## 2012-04-13 ENCOUNTER — Inpatient Hospital Stay (HOSPITAL_COMMUNITY): Payer: Medicare Other

## 2012-04-13 LAB — BASIC METABOLIC PANEL
BUN: 11 mg/dL (ref 6–23)
Calcium: 8.3 mg/dL — ABNORMAL LOW (ref 8.4–10.5)
Creatinine, Ser: 0.58 mg/dL (ref 0.50–1.10)
GFR calc Af Amer: >90 mL/min (ref >90)
GFR calc non Af Amer: 86 mL/min — ABNORMAL LOW (ref >90)

## 2012-04-13 LAB — CBC
Hemoglobin: 9.7 g/dL — ABNORMAL LOW (ref 12.0–15.0)
MCH: 29.6 pg (ref 26.0–34.0)
MCHC: 34.3 g/dL (ref 30.0–36.0)
Platelets: 187 10*3/uL (ref 150–400)
RDW: 15.3 % (ref 11.5–15.5)

## 2012-04-13 LAB — MAGNESIUM: Magnesium: 1.5 mg/dL (ref 1.5–2.5)

## 2012-04-13 MED ORDER — MAGNESIUM SULFATE 40 MG/ML IJ SOLN
2.0000 g | Freq: Once | INTRAMUSCULAR | Status: AC
  Administered 2012-04-13: 2 g via INTRAVENOUS
  Filled 2012-04-13: qty 50

## 2012-04-13 MED ORDER — LEVOTHYROXINE SODIUM 25 MCG PO TABS
25.0000 ug | ORAL_TABLET | Freq: Every day | ORAL | Status: DC
  Administered 2012-04-13 – 2012-04-16 (×4): 25 ug via ORAL
  Filled 2012-04-13 (×4): qty 1

## 2012-04-13 MED ORDER — POTASSIUM CHLORIDE 10 MEQ/100ML IV SOLN
10.0000 meq | INTRAVENOUS | Status: AC
  Administered 2012-04-13 (×4): 10 meq via INTRAVENOUS
  Filled 2012-04-13 (×4): qty 100

## 2012-04-13 MED ORDER — POTASSIUM CHLORIDE 20 MEQ/15ML (10%) PO LIQD
40.0000 meq | Freq: Two times a day (BID) | ORAL | Status: DC
  Administered 2012-04-13 – 2012-04-14 (×3): 40 meq via ORAL
  Filled 2012-04-13 (×3): qty 30

## 2012-04-13 NOTE — Evaluation (Signed)
Clinical/Bedside Swallow Evaluation Patient Details  Name: Ebony [name] MRN: [id number] Date of Birth: 03-11-1931  Today's Date: 04/12/2012 Time:  - 6:30 PM    Past Medical History:  Past Medical History  Diagnosis Date  . Hypertension    Past Surgical History:  Past Surgical History  Procedure Laterality Date  . Abdominal hysterectomy    . Breast surgery    . Tonsillectomy    . Eye surgery     HPI:  [name] is a 79 yo female admitted on Saturday with change in mental status. Elderly Caucasian lady who lives alone, is usually alert oriented and talkative, does suffer with hypertension and medications includes a diuretic, does have a  shuffling gait abnormality, with a recommended walker, but history of or current falls because of failure to use her walker. Over the past few months has had recurrent falls which have caused facial laceration. right leg wound, and recently the left upper extremity abrasions. She has a history of subdural hematoma and subarachnoid hemorrhage related to a fall year ago.   Assessment / Plan / Recommendation Clinical Impression  SLP attempted to see pt earlier in the day and she was very difficult to rouse. She was seen with pm dinner meal and was much more alert and talkative. She was feeding herself in bed, but had difficulty getting food to her mouth without spilling likely due to sore hand/limbs. She tolerated thin liquids without incident, but did have some difficulty manipulating solids (meat was pretty dry/tough) and she had some delayed coughing after corn bread. Recommend D3/mech soft and thin liquids with feeder assist. SLP to follow while in patient.    Aspiration Risk  Mild    Diet Recommendation Dysphagia 3 (Mechanical Soft);Thin liquid   Liquid Administration via: Cup;Straw Medication Administration: Whole meds with liquid Supervision: Intermittent supervision to cue for compensatory strategies (feeder assist lilkely  needed) Compensations: Slow rate;Small sips/bites;Check for pocketing;Follow solids with liquid Postural Changes and/or Swallow Maneuvers: Seated upright 90 degrees;Upright 30-60 min after meal    Other  Recommendations Recommended Consults: OT self-feeding Oral Care Recommendations: Oral care BID   Follow Up Recommendations  Skilled Nursing facility    Frequency and Duration min 2x/week  1 week       SLP Swallow Goals Patient will consume recommended diet without observed clinical signs of aspiration with: Supervision/safety Patient will utilize recommended strategies during swallow to increase swallowing safety with: Supervision/safety   Swallow Study Prior Functional Status   Lived at home alone, but had frequent falls.    General Date of Onset: 04/09/12 HPI: [name] is a 79 yo female admitted on Saturday with change in mental status. Elderly Caucasian lady who lives alone, is usually alert oriented and talkative, does suffer with hypertension and medications includes a diuretic, does have a  shuffling gait abnormality, with a recommended walker, but history of or current falls because of failure to use her walker. Over the past few months has had recurrent falls which have caused facial laceration. right leg wound, and recently the left upper extremity abrasions. She has a history of subdural hematoma and subarachnoid hemorrhage related to a fall year ago. Type of Study: Bedside swallow evaluation Temperature Spikes Noted: Yes Respiratory Status: Supplemental O2 delivered via (comment) History of Recent Intubation: No Behavior/Cognition: Alert;Cooperative;Pleasant mood Oral Cavity - Dentition: Adequate natural dentition Self-Feeding Abilities: Needs assist Baseline Vocal Quality: Clear Volitional Cough: Weak Volitional Swallow: Able to elicit    Oral/Motor/Sensory Function  Overall Oral Motor/Sensory Function: Appears within functional limits for tasks assessed    Ice Chips Ice chips: Within functional limits   Thin Liquid Thin Liquid: Within functional limits Presentation: Straw;Self Fed    Nectar Thick Nectar Thick Liquid: Not tested   Honey Thick Honey Thick Liquid: Not tested   Puree Puree: Within functional limits Presentation: Self Fed;Spoon   Solid       Solid: Impaired Presentation: Self Fed Oral Phase Impairments: Reduced lingual movement/coordination Oral Phase Functional Implications: Oral residue Pharyngeal Phase Impairments: Multiple swallows;Cough - Delayed       Owen Pratte 04/12/2012,6:42 PM

## 2012-04-13 NOTE — Progress Notes (Signed)
Occupational Therapy Treatment Patient Details Name: Ebony [name] MRN: [id number] DOB: [birth year] Today's Date: 04/13/2012 Time: 0215-0250 OT Time Calculation (min): 35 min  OT Assessment / Plan / Recommendation Comments on Treatment Session Patient feeling much better this date. Patient was oriented x4. Patient performed transfer to commode with Max Assist of 2 person assist.                     Plan Discharge plan remains appropriate;Frequency remains appropriate    Precautions / Restrictions Precautions Precautions: Fall   Pertinent Vitals/Pain No complaints.    ADL  Toilet Transfer: Performed;+2 Total assistance Toilet Transfer: Patient Percentage: 60% Toilet Transfer Method: Stand pivot Acupuncturist: Bedside commode Toileting - Clothing Manipulation and Hygiene: Performed;+1 Total assistance Where Assessed - Engineer, mining and Hygiene: Lean right and/or left Transfers/Ambulation Related to ADLs: Patient transfered to bed side commode with 2 person assist. stand pivot      OT Goals ADL Goals Pt Will Transfer to Toilet: with max assist;Stand pivot transfer;3-in-1 ADL Goal: Toilet Transfer - Progress: Progressing toward goals  Visit Information  Last OT Received On: 04/13/12 Assistance Needed: +2 PT/OT Co-Evaluation/Treatment: Yes    Subjective Data  Subjective: "I feel like I have to do something on the toilet." Patient Stated Goal: To use the bathroom.      Cognition  Cognition Overall Cognitive Status: Appears within functional limits for tasks assessed/performed Arousal/Alertness: Awake/alert Orientation Level: Appears intact for tasks assessed Behavior During Session: [facility] for tasks performed    Mobility  Bed Mobility Supine to Sit: 1: +1 Total assist;HOB elevated Supine to Sit: Patient Percentage: 0% Transfers Sit to Stand: 2: Max assist;From bed;From chair/3-in-1 Stand to Sit: 2: Max assist;To chair/3-in-1 Details  for Transfer Assistance: Pt requires max assist from two people but is not a total assist.          End of Session OT - End of Session Equipment Utilized During Treatment: Gait belt Activity Tolerance: Patient tolerated treatment well Patient left: in chair;with call bell/phone within reach;with chair alarm set Nurse Communication: Other (comment) (BM results)    Limmie Patricia, OTR/L  04/13/2012, 3:02 PM

## 2012-04-13 NOTE — Clinical Social Work Note (Signed)
CSW spoke w daughter Darl Pikes, family has chosen [facility] [place], and spoken w facility admissions.  CSW spoke w Bjorn Loser, [facility], facility is willing to admit patient and will request SNF authorization from insurance.  Facility ready for patient when appropriate to discharge.  Santa Genera, LCSW Clinical Social Worker [phone])

## 2012-04-13 NOTE — Progress Notes (Signed)
Found patient had taken saline lock IV out of right wrist.  Patient verbalized she did not think she needed anymore so she removed it.  Informed MD.  MD stated to leave IV out at this time. Schonewitz, Ebony [name] 04/13/2012

## 2012-04-13 NOTE — Progress Notes (Signed)
Subjective: Slight cough. Leg pain improved. Hand and wrist pain still present, but improved. No chills. Wants Foley catheter out. Eating well.  Objective: Vital signs in last 24 hours: Filed Vitals:   04/12/12 1230 04/12/12 1913 04/12/12 2248 04/13/12 0631  BP:  98/62 125/69 125/68  Pulse:  82 91 92  Temp: 101 F (38.3 C) 98.2 F (36.8 C) 97.8 F (36.6 C) 98 F (36.7 C)  TempSrc: Axillary Oral Oral Oral  Resp:  20 20 20   Height:      Weight:      SpO2:  96% 97% 94%   Weight change:   Intake/Output Summary (Last 24 hours) at 04/13/12 1052 Last data filed at 04/13/12 1610  Gross per 24 hour  Intake    120 ml  Output   1200 ml  Net  -1080 ml   Telemetry: Normal sinus rhythm  Gen.: Alert. Oriented x4 HEENT: Moist mucous membranes Lungs clear to auscultation bilaterally without wheezes rhonchi or rales Cardiovascular regular rate rhythm without murmurs gallops rubs Abdomen soft nontender nondistended Extremities: Sequential compression devices and TED hose in place. Right wrist and first and second MCP joints tender.  Right hand and wrist indurated Skin: Bruising noted on left arm. Neurologic: No cranial nerve deficits. No definite strength deficits.  Lab Results: Basic Metabolic Panel:  Recent Labs Lab 04/09/12 2136  04/12/12 0532 04/13/12 0527  NA 133*  < > 134* 131*  K 2.8*  < > 3.2* 2.9*  CL 92*  < > 98 97  CO2 27  < > 23 24  GLUCOSE 118*  < > 92 90  BUN 12  < > 10 11  CREATININE 0.73  < > 0.58 0.58  CALCIUM 9.9  < > 8.6 8.3*  MG 1.4*  --   --  1.5  < > = values in this interval not displayed. Liver Function Tests:  Recent Labs Lab 04/09/12 2136 04/10/12 0458  AST 53* 66*  ALT 32 33  ALKPHOS 148* 134*  BILITOT 0.8 0.7  PROT 7.3 6.8  ALBUMIN 4.1 3.6   No results found for this basename: LIPASE, AMYLASE,  in the last 168 hours No results found for this basename: AMMONIA,  in the last 168 hours CBC:  Recent Labs Lab 04/09/12 2136   04/12/12 0532 04/13/12 0527  WBC 9.5  < > 10.5 8.9  NEUTROABS 7.4  --   --   --   HGB 13.2  < > 10.7* 9.7*  HCT 38.4  < > 32.2* 28.3*  MCV 84.0  < > 87.0 86.3  PLT 225  < > 196 187  < > = values in this interval not displayed. Cardiac Enzymes:  Recent Labs Lab 04/09/12 2136  CKTOTAL 643*  TROPONINI <0.30   BNP: No results found for this basename: PROBNP,  in the last 168 hours D-Dimer: No results found for this basename: DDIMER,  in the last 168 hours CBG:  Recent Labs Lab 04/09/12 2125  GLUCAP 109*   Hemoglobin A1C: No results found for this basename: HGBA1C,  in the last 168 hours Fasting Lipid Panel:  Recent Labs Lab 04/12/12 0535  CHOL 168  HDL 52  LDLCALC 98  TRIG 89  CHOLHDL 3.2   Thyroid Function Tests:  Recent Labs Lab 04/10/12 0139 04/12/12 0532  TSH 4.839*  --   FREET4  --  1.42  T3FREE  --  2.1*   Coagulation:  Recent Labs Lab 04/09/12 2136  LABPROT 12.7  INR 0.96   Anemia Panel: No results found for this basename: VITAMINB12, FOLATE, FERRITIN, TIBC, IRON, RETICCTPCT,  in the last 168 hours Urine Drug Screen: Drugs of Abuse     Component Value Date/Time   LABOPIA NONE DETECTED 04/10/2012 0041   COCAINSCRNUR NONE DETECTED 04/10/2012 0041   LABBENZ NONE DETECTED 04/10/2012 0041   AMPHETMU NONE DETECTED 04/10/2012 0041   THCU NONE DETECTED 04/10/2012 0041   LABBARB NONE DETECTED 04/10/2012 0041    Alcohol Level:  Recent Labs Lab 04/09/12 2136  ETH <11   EKG from yesterday shows a atrial fibrillation with rapid ventricular response  Urinalysis:  Recent Labs Lab 04/09/12 2314 04/12/12 1451  COLORURINE YELLOW YELLOW  LABSPEC 1.010 >1.030*  PHURINE 7.5 5.5  GLUCOSEU NEGATIVE NEGATIVE  HGBUR SMALL* TRACE*  BILIRUBINUR NEGATIVE SMALL*  KETONESUR TRACE* TRACE*  PROTEINUR NEGATIVE TRACE*  UROBILINOGEN 0.2 1.0  NITRITE NEGATIVE NEGATIVE  LEUKOCYTESUR NEGATIVE NEGATIVE   Micro Results: Recent Results (from the past 240 hour(s))   URINE CULTURE     Status: None   Collection Time    04/09/12 11:14 PM      Result Value Range Status   Specimen Description URINE, CLEAN CATCH   Final   Special Requests Normal   Final   Culture  Setup Time 04/10/2012 22:53   Final   Colony Count NO GROWTH   Final   Culture NO GROWTH   Final   Report Status 04/11/2012 FINAL   Final  CULTURE, BLOOD (ROUTINE X 2)     Status: None   Collection Time    04/10/12  1:35 AM      Result Value Range Status   Specimen Description BLOOD RIGHT ANTECUBITAL   Final   Special Requests BOTTLES DRAWN AEROBIC AND ANAEROBIC 7CC   Final   Culture NO GROWTH 2 DAYS   Final   Report Status PENDING   Incomplete  CULTURE, BLOOD (ROUTINE X 2)     Status: None   Collection Time    04/10/12  1:39 AM      Result Value Range Status   Specimen Description BLOOD RIGHT ARM   Final   Special Requests BOTTLES DRAWN AEROBIC AND ANAEROBIC 6CC   Final   Culture NO GROWTH 2 DAYS   Final   Report Status PENDING   Incomplete  MRSA PCR SCREENING     Status: None   Collection Time    04/10/12  4:00 AM      Result Value Range Status   MRSA by PCR NEGATIVE  NEGATIVE Final   Comment:            The GeneXpert MRSA Assay (FDA     approved for NASAL specimens     only), is one component of a     comprehensive MRSA colonization     surveillance program. It is not     intended to diagnose MRSA     infection nor to guide or     monitor treatment for     MRSA infections.   Studies/Results: Dg Wrist Complete Right  04/12/2012  *RADIOLOGY REPORT*  Clinical Data: Swelling and pain  RIGHT WRIST - COMPLETE 3+ VIEW  Comparison: None.  Findings: No fracture or dislocation.  Advanced degenerative change most notable at the intercarpal, and carpometacarpal joint radially. Osteopenia.  Mild soft tissue swelling.  IMPRESSION: Degenerative changes as described.  Mild soft tissue swelling.   Original Report Authenticated By: Davonna Belling, M.D.    US  Carotid Duplex  Bilateral  04/11/2012  *RADIOLOGY REPORT*  Clinical Data: History of stroke, hypertension, altered mental status  BILATERAL CAROTID DUPLEX ULTRASOUND  Technique: Gray scale imaging, color Doppler and duplex ultrasound was performed of bilateral carotid and vertebral arteries in the neck.  Comparison:  Brain MRI - earlier same day  Criteria:  Quantification of carotid stenosis is based on velocity parameters that correlate the residual internal carotid diameter with NASCET-based stenosis levels, using the diameter of the distal internal carotid lumen as the denominator for stenosis measurement.  The following velocity measurements were obtained:                   PEAK SYSTOLIC/END DIASTOLIC RIGHT ICA:                        115/38cm/sec CCA:                        98/22cm/sec SYSTOLIC ICA/CCA RATIO:     1.1 a DIASTOLIC ICA/CCA RATIO:    1.74 ECA:                        73cm/sec  LEFT ICA:                        111/10cm/sec CCA:                        101/22cm/sec SYSTOLIC ICA/CCA RATIO:     1.09 DIASTOLIC ICA/CCA RATIO:    0.45 ECA:                        111cm/sec  Findings:  RIGHT CAROTID ARTERY: There is no gray-scale evidence of significant atherosclerotic plaque within the right carotid system. No elevated peak systolic velocity with the right internal carotid artery to suggest hemodynamically significant stenosis.  RIGHT VERTEBRAL ARTERY:  Antegrade flow  LEFT CAROTID ARTERY: There is no gray-scale evidence of significant atherosclerotic plaque of the left carotid system. Isolated borderline elevated peak systolic velocity within the mid aspect of the left internal carotid artery is felt to be factitiously elevated due to sampling error.  Non-elevated peak systolic velocities are seen within the proximal and distal aspects of the left internal carotid artery.  LEFT VERTEBRAL ARTERY:  Antegrade flow  IMPRESSION:  Unremarkable carotid Doppler ultrasound without evidence of significant atherosclerotic plaque  within either carotid system or definitive evidence of hemodynamically significant stenosis.   Original Report Authenticated By: Tacey Ruiz, MD    Dg Chest Port 1 View  04/12/2012  *RADIOLOGY REPORT*  Clinical Data: Possible aspiration pneumonia.  PORTABLE CHEST - 1 VIEW  Comparison: 12/08/2012  Findings: Cardiomegaly.  Chronic bronchitic change.  No infiltrate or failure.  Osteopenia.  IMPRESSION: Cardiomegaly with chronic bronchitic change as described.  No active infiltrates.  Stable appearance from priors.   Original Report Authenticated By: Davonna Belling, M.D.    Dg Hand Complete Right  04/12/2012  *RADIOLOGY REPORT*  Clinical Data: Pain and swelling.  RIGHT HAND - COMPLETE 3+ VIEW  Comparison: None.  Findings: Moderate soft tissue swelling.  Advanced degenerative change throughout the metacarpophalangeal and interphalangeal joints.  No fracture or dislocation.  IMPRESSION: As above.   Original Report Authenticated By: Davonna Belling, M.D.    Scheduled Meds: . ALPRAZolam  0.5 mg Oral QHS  . antiseptic oral rinse  15 mL Mouth Rinse q12n4p  . chlorhexidine  15 mL Mouth Rinse BID  . diltiazem  120 mg Oral Daily  . levothyroxine  25 mcg Oral QAC breakfast  . metoprolol succinate  100 mg Oral BH-q7a  . nortriptyline  50 mg Oral QHS  . potassium chloride  10 mEq Intravenous Q1 Hr x 4  . sertraline  100 mg Oral q morning - 10a   Continuous Infusions:   PRN Meds:.acetaminophen, haloperidol lactate, hydrALAZINE, LORazepam, ondansetron (ZOFRAN) IV Assessment/Plan: Principal Problem:   Delirium: Resolved. Possibly benzodiazepine withdrawal, but likely multifactorial, subdural hematomas, traumatic subarachnoid hematomas, small ischemic stroke. neurology consults pending. Carotid Dopplers show no critical ischemia. Echocardiogram without significant abnormalities  Active Problems: Ischemic stroke: Difficult to assess for weakness, as patient is not completely cooperative with exam due to pain. Not a  candidate for TPA or anticoagulation or antiplatelets at this time due to intracranial hemorrhage.   Traumatic SDH (subdural hematoma), small, nonoperative management: Unclear how much of this is old and if there is a new component. Patient had a traumatic subdural hematoma and subarachnoid hemorrhage a year ago as well after falling Single elevated temperature: Infectious workup negative to date. Traumatic subarachnoid hemorrhage with intraventricular blood.   HTN (hypertension) controlled. Patient started on home medications yesterday   traumatic leg ulcer: No infection noted.  Atrial fibrillation transient: Unclear whether this is new or old. Not a candidate for anticoagulation Due to falls and intracranial bleed. Still no records from primary care provider. Continue telemetry, Cardizem and Toprol-XL   Dehydration:   Hold diuretics   Frequent Falls, noncompliant with walker. Will no longer be safe to live alone. Family is interested in short-term skilled nursing facility and then she will be staying with her daughter. Right hand and wrist pain: Will x-ray hand and wrist.   Anxiety disorder   Hypokalemia continue repletion Hypothyroidism, new diagnosis. Will start low-dose Synthroid   Will transfer to telemetry. Will monitor for another 24 hours and hopefully to skilled nursing facility tomorrow if stable. We'll recheck labs.   LOS: 4 days   Ebony [name] L  04/13/2012, 10:52 AM

## 2012-04-13 NOTE — Progress Notes (Signed)
Physical Therapy Treatment Patient Details Name: Ebony [name] MRN: [id number] DOB: 01-Dec-1931 Today's Date: 04/13/2012 Time: 1430-1450 PT Time Calculation (min): 20 min  PT Assessment / Plan / Recommendation Comments on Treatment Session  Pt appears to tolerate treatment much better than last session. Transfer completed from bed to [facility] and from [facility] to recliner. This session was a co-treat with OT.                        Plan  (Continue to progress per PT POC.)    Precautions / Restrictions Precautions Precautions: Fall       Mobility  Bed Mobility Supine to Sit: 1: +1 Total assist;HOB elevated Supine to Sit: Patient Percentage: 0% Transfers Transfers: Sit to Stand;Stand to Sit;Stand Pivot Transfers Sit to Stand: 2: Max assist;From bed;From chair/3-in-1 Stand to Sit: 2: Max assist;To chair/3-in-1 Stand Pivot Transfers: 2: Max assist Details for Transfer Assistance: Pt requires max assist from two people but is not a total assist. Ambulation/Gait Ambulation/Gait Assistance: Not tested (comment)    Exercises General Exercises - Lower Extremity Toe Raises: AROM;Both;10 reps;Seated Heel Raises: AROM;Both;10 reps;Seated   Visit Information  Last PT Received On: 04/13/12 Assistance Needed: +2    Subjective Data  Subjective: Pt states that she feels much better.   Cognition  Cognition Overall Cognitive Status: Appears within functional limits for tasks assessed/performed Arousal/Alertness: Awake/alert Orientation Level: Appears intact for tasks assessed Behavior During Session: [facility] for tasks performed       End of Session PT - End of Session Equipment Utilized During Treatment: Gait belt Activity Tolerance: Patient tolerated treatment well;Patient limited by fatigue Patient left: in chair;with call bell/phone within reach;with chair alarm set   Seth Bake, PTA  04/13/2012, 3:03 PM

## 2012-04-14 ENCOUNTER — Other Ambulatory Visit (HOSPITAL_COMMUNITY): Payer: Medicare Other

## 2012-04-14 LAB — BASIC METABOLIC PANEL
Chloride: 96 mEq/L (ref 96–112)
GFR calc Af Amer: >90 mL/min (ref >90)
GFR calc non Af Amer: 88 mL/min — ABNORMAL LOW (ref >90)
Glucose, Bld: 93 mg/dL (ref 70–99)
Potassium: 4 mEq/L (ref 3.5–5.1)
Sodium: 129 mEq/L — ABNORMAL LOW (ref 135–145)

## 2012-04-14 MED ORDER — NORTRIPTYLINE HCL 25 MG PO CAPS
ORAL_CAPSULE | ORAL | Status: AC
  Filled 2012-04-14: qty 2

## 2012-04-14 NOTE — Progress Notes (Addendum)
Neurology note reviewed. Yesterday, patient took out her own IV, because she thought she was leaving today.   Subjective: No new complaints.  Objective: Vital signs in last 24 hours: Filed Vitals:   04/13/12 1900 04/13/12 2300 04/14/12 0209 04/14/12 0631  BP: 129/63 150/79 132/85 146/80  Pulse: 89 87 87 87  Temp: 98.1 F (36.7 C) 98.2 F (36.8 C) 99.1 F (37.3 C) 97.7 F (36.5 C)  TempSrc: Oral Oral Oral Oral  Resp: 20 20 16 18   Height:      Weight:      SpO2: 97% 93% 95% 94%   Weight change:   Intake/Output Summary (Last 24 hours) at 04/14/12 1132 Last data filed at 04/14/12 0500  Gross per 24 hour  Intake    120 ml  Output   1950 ml  Net  -1830 ml   Telemetry: Normal sinus rhythm  Gen.: Alert. Oriented x4 HEENT: Moist mucous membranes Lungs clear to auscultation bilaterally without wheezes rhonchi or rales Cardiovascular regular rate rhythm without murmurs gallops rubs Abdomen soft nontender nondistended Extremities: Sequential compression devices and TED hose in place. Right hand and wrist less swollen and tender. Better range of motion. Skin: Bruising noted on left arm. Neurologic: No cranial nerve deficits. No definite strength deficits.  Lab Results: Basic Metabolic Panel:  Recent Labs Lab 04/09/12 2136  04/13/12 0527 04/14/12 0518  NA 133*  < > 131* 129*  K 2.8*  < > 2.9* 4.0  CL 92*  < > 97 96  CO2 27  < > 24 25  GLUCOSE 118*  < > 90 93  BUN 12  < > 11 10  CREATININE 0.73  < > 0.58 0.54  CALCIUM 9.9  < > 8.3* 8.6  MG 1.4*  --  1.5  --   < > = values in this interval not displayed. Liver Function Tests:  Recent Labs Lab 04/09/12 2136 04/10/12 0458  AST 53* 66*  ALT 32 33  ALKPHOS 148* 134*  BILITOT 0.8 0.7  PROT 7.3 6.8  ALBUMIN 4.1 3.6   No results found for this basename: LIPASE, AMYLASE,  in the last 168 hours No results found for this basename: AMMONIA,  in the last 168 hours CBC:  Recent Labs Lab 04/09/12 2136  04/12/12 0532  04/13/12 0527  WBC 9.5  < > 10.5 8.9  NEUTROABS 7.4  --   --   --   HGB 13.2  < > 10.7* 9.7*  HCT 38.4  < > 32.2* 28.3*  MCV 84.0  < > 87.0 86.3  PLT 225  < > 196 187  < > = values in this interval not displayed. Cardiac Enzymes:  Recent Labs Lab 04/09/12 2136  CKTOTAL 643*  TROPONINI <0.30   BNP: No results found for this basename: PROBNP,  in the last 168 hours D-Dimer: No results found for this basename: DDIMER,  in the last 168 hours CBG:  Recent Labs Lab 04/09/12 2125  GLUCAP 109*   Hemoglobin A1C: No results found for this basename: HGBA1C,  in the last 168 hours Fasting Lipid Panel:  Recent Labs Lab 04/12/12 0535  CHOL 168  HDL 52  LDLCALC 98  TRIG 89  CHOLHDL 3.2   Thyroid Function Tests:  Recent Labs Lab 04/10/12 0139 04/12/12 0532  TSH 4.839*  --   FREET4  --  1.42  T3FREE  --  2.1*   Coagulation:  Recent Labs Lab 04/09/12 2136  LABPROT 12.7  INR 0.96  Anemia Panel: No results found for this basename: VITAMINB12, FOLATE, FERRITIN, TIBC, IRON, RETICCTPCT,  in the last 168 hours Urine Drug Screen: Drugs of Abuse     Component Value Date/Time   LABOPIA NONE DETECTED 04/10/2012 0041   COCAINSCRNUR NONE DETECTED 04/10/2012 0041   LABBENZ NONE DETECTED 04/10/2012 0041   AMPHETMU NONE DETECTED 04/10/2012 0041   THCU NONE DETECTED 04/10/2012 0041   LABBARB NONE DETECTED 04/10/2012 0041    Alcohol Level:  Recent Labs Lab 04/09/12 2136  ETH <11   EKG from yesterday shows a atrial fibrillation with rapid ventricular response  Urinalysis:  Recent Labs Lab 04/09/12 2314 04/12/12 1451  COLORURINE YELLOW YELLOW  LABSPEC 1.010 >1.030*  PHURINE 7.5 5.5  GLUCOSEU NEGATIVE NEGATIVE  HGBUR SMALL* TRACE*  BILIRUBINUR NEGATIVE SMALL*  KETONESUR TRACE* TRACE*  PROTEINUR NEGATIVE TRACE*  UROBILINOGEN 0.2 1.0  NITRITE NEGATIVE NEGATIVE  LEUKOCYTESUR NEGATIVE NEGATIVE   Micro Results: Recent Results (from the past 240 hour(s))  URINE CULTURE      Status: None   Collection Time    04/09/12 11:14 PM      Result Value Range Status   Specimen Description URINE, CLEAN CATCH   Final   Special Requests Normal   Final   Culture  Setup Time 04/10/2012 22:53   Final   Colony Count NO GROWTH   Final   Culture NO GROWTH   Final   Report Status 04/11/2012 FINAL   Final  CULTURE, BLOOD (ROUTINE X 2)     Status: None   Collection Time    04/10/12  1:35 AM      Result Value Range Status   Specimen Description BLOOD RIGHT ANTECUBITAL   Final   Special Requests BOTTLES DRAWN AEROBIC AND ANAEROBIC 7CC   Final   Culture NO GROWTH 2 DAYS   Final   Report Status PENDING   Incomplete  CULTURE, BLOOD (ROUTINE X 2)     Status: None   Collection Time    04/10/12  1:39 AM      Result Value Range Status   Specimen Description BLOOD RIGHT ARM   Final   Special Requests BOTTLES DRAWN AEROBIC AND ANAEROBIC 6CC   Final   Culture NO GROWTH 2 DAYS   Final   Report Status PENDING   Incomplete  MRSA PCR SCREENING     Status: None   Collection Time    04/10/12  4:00 AM      Result Value Range Status   MRSA by PCR NEGATIVE  NEGATIVE Final   Comment:            The GeneXpert MRSA Assay (FDA     approved for NASAL specimens     only), is one component of a     comprehensive MRSA colonization     surveillance program. It is not     intended to diagnose MRSA     infection nor to guide or     monitor treatment for     MRSA infections.   Studies/Results: Dg Wrist Complete Right  04/12/2012  *RADIOLOGY REPORT*  Clinical Data: Swelling and pain  RIGHT WRIST - COMPLETE 3+ VIEW  Comparison: None.  Findings: No fracture or dislocation.  Advanced degenerative change most notable at the intercarpal, and carpometacarpal joint radially. Osteopenia.  Mild soft tissue swelling.  IMPRESSION: Degenerative changes as described.  Mild soft tissue swelling.   Original Report Authenticated By: Davonna Belling, M.D.    Ct Head Wo Contrast  04/13/2012  *RADIOLOGY REPORT*   Clinical Data: Subarachnoid and subdural hematomas.  CT HEAD WITHOUT CONTRAST  Technique:  Contiguous axial images were obtained from the base of the skull through the vertex without contrast.  Comparison: MRI brain 04/11/2012.  CT head without contrast 04/09/2012.  Findings: The right convexity subdural collection is of lower density.  There is no significant increase in size or evidence for new hemorrhage at that location.  Left subdural blood products along the anterior falx is less conspicuous as well.  Blood products along the right tentorium may be secondary to a layering and patient position.  Blood products along the right tentorium are more prominent than on the prior study.  Interventricular hemorrhage is similar to the prior MRI, but new from the CT scan. Atrophy white matter disease is otherwise stable.  IMPRESSION:  1.  Decreased conspicuity of subdural blood over the right frontal convexity and adjacent to the anterior falx on the left. 2.  New blood products are evident along the tentorium.  These could be positional. 3.  Stable intraventricular blood products. 4.  Stable atrophy and white matter disease.   Original Report Authenticated By: Marin Roberts, M.D.    Dg Chest Port 1 View  04/12/2012  *RADIOLOGY REPORT*  Clinical Data: Possible aspiration pneumonia.  PORTABLE CHEST - 1 VIEW  Comparison: 12/08/2012  Findings: Cardiomegaly.  Chronic bronchitic change.  No infiltrate or failure.  Osteopenia.  IMPRESSION: Cardiomegaly with chronic bronchitic change as described.  No active infiltrates.  Stable appearance from priors.   Original Report Authenticated By: Davonna Belling, M.D.    Dg Hand Complete Right  04/12/2012  *RADIOLOGY REPORT*  Clinical Data: Pain and swelling.  RIGHT HAND - COMPLETE 3+ VIEW  Comparison: None.  Findings: Moderate soft tissue swelling.  Advanced degenerative change throughout the metacarpophalangeal and interphalangeal joints.  No fracture or dislocation.   IMPRESSION: As above.   Original Report Authenticated By: Davonna Belling, M.D.    Scheduled Meds: . ALPRAZolam  0.5 mg Oral QHS  . antiseptic oral rinse  15 mL Mouth Rinse q12n4p  . chlorhexidine  15 mL Mouth Rinse BID  . diltiazem  120 mg Oral Daily  . levothyroxine  25 mcg Oral QAC breakfast  . metoprolol succinate  100 mg Oral BH-q7a  . nortriptyline  50 mg Oral QHS  . potassium chloride  40 mEq Oral BID  . sertraline  100 mg Oral q morning - 10a   Continuous Infusions:   PRN Meds:.acetaminophen, LORazepam, ondansetron (ZOFRAN) IV Assessment/Plan: Principal Problem:   Delirium: Resolved. Possibly benzodiazepine withdrawal, but likely multifactorial, subdural hematomas, traumatic subarachnoid hematomas, small ischemic stroke. Neurology has ordered an EEG which will be done late afternoon today. Active Problems: Ischemic stroke: Difficult to assess for weakness, as patient is not completely cooperative with exam due to pain. Not a candidate for TPA or anticoagulation or antiplatelets at this time due to intracranial hemorrhage.   Traumatic SDH (subdural hematoma), small, nonoperative management: Unclear how much of this is old and if there is a new component. Patient had a traumatic subdural hematoma and subarachnoid hemorrhage a year ago as well after falling Single elevated temperature: Infectious workup negative to date. Traumatic subarachnoid hemorrhage with intraventricular blood. Repeat CAT scan shows stability of   HTN (hypertension) controlled. Patient started on home medications yesterday   traumatic leg ulcer: No infection noted.  Atrial fibrillation transient: Unclear whether this is new or old. Not a candidate for anticoagulation Due to falls and  intracranial bleed. Still no records from primary care provider. Continue telemetry, Cardizem and Toprol-XL   Dehydration:   Hold diuretics   Frequent Falls, noncompliant with walker. Will no longer be safe to live alone. Family is  interested in short-term skilled nursing facility and then she will be staying with her daughter. Right hand and wrist pain: X-rays negative   Anxiety disorder   Hypokalemia resolved Hypothyroidism, new diagnosis. Will start low-dose Synthroid  Will hold on discharge, as patient is getting her EEG today.   LOS: 5 days   Ebony [name]  04/14/2012, 11:32 AM  Called by RN and a bowel fresh blood in stool. Stool is brown with scant amount of red blood on tissue paper. Visual inspection of the rectal area reveals a 2 cm skin tear in the gluteal cleft with oozing blood. No evidence of GI bleed. No further workup needed.

## 2012-04-14 NOTE — Clinical Social Work Note (Signed)
CSW updated [facility] admissions and patient's daughter, Maye Hides, about discharge plans and possible transfer to SNF tomorrow if medically appropriate.  Bed continues to be available. Family agreeable to plan.    Santa Genera, LCSW Clinical Social Worker [phone])

## 2012-04-14 NOTE — Progress Notes (Signed)
Physical Therapy Treatment Patient Details Name: Ebony [name] MRN: [id number] DOB: 14-Dec-1931 Today's Date: 04/14/2012 Time: 6213-0865 PT Time Calculation (min): 49 min  PT Assessment / Plan / Recommendation Comments on Treatment Session  Pt remains confused byut cooperative.  She has significant weakness in both LEs and is unable to progress to gait as yet.  I fitted a platform to the walker in order to protect her R wrist from weight bearing and this will need to continue to be used for any gait training.    Follow Up Recommendations        Does the patient have the potential to tolerate intense rehabilitation     Barriers to Discharge        Equipment Recommendations       Recommendations for Other Services    Frequency     Plan Discharge plan remains appropriate;Frequency remains appropriate    Precautions / Restrictions     Pertinent Vitals/Pain     Mobility  Bed Mobility Rolling Right: 2: Max assist Rolling Left: 2: Max assist Supine to Sit: 2: Max assist Supine to Sit: Patient Percentage: 10% Sit to Supine: Not Tested (comment) Transfers Sit to Stand: 2: Max assist Sit to Stand: Patient Percentage: 10% Stand to Sit: 2: Max assist;To bed;To chair/3-in-1 Stand Pivot Transfers: 2: Max assist Details for Transfer Assistance: pt needed max assist x1 today.  She was able to stand to walker, platform on R (in order to protect R wrist which is still swollen and sore), but she was unable to offload either LE in order to move her feet to take a step.  I anticipate that she will need a  R platform for awhile .  Ambulation/Gait Ambulation/Gait Assistance: Not tested (comment)    Exercises General Exercises - Lower Extremity Ankle Circles/Pumps: AROM;Both;10 reps Short Arc QuadBarbaraann Boys;Both;10 reps;Supine Long Arc Quad: AAROM;Both;10 reps;Seated Heel Slides: AAROM;Both;10 reps;Supine Hip ABduction/ADduction: AAROM;Both;10 reps;Supine Straight Leg Raises: AAROM;Both;10  reps;Supine Hip Flexion/Marching: AAROM;Both;10 reps;Seated   PT Diagnosis:    PT Problem List:   PT Treatment Interventions:     PT Goals Acute Rehab PT Goals PT Goal: Rolling Supine to Right Side - Progress: Progressing toward goal PT Goal: Rolling Supine to Left Side - Progress: Progressing toward goal PT Goal: Supine/Side to Sit - Progress: Progressing toward goal PT Transfer Goal: Bed to Chair/Chair to Bed - Progress: Progressing toward goal  Visit Information  Last PT Received On: 04/14/12    Subjective Data  Subjective: She is surprised at how weak she has gotten   Cognition       Balance     End of Session PT - End of Session Equipment Utilized During Treatment: Gait belt (Platform walker, R) Activity Tolerance: Patient tolerated treatment well Patient left: in chair;with call bell/phone within reach;with chair alarm set Nurse Communication: Mobility status   GP     Konrad Penta 04/14/2012, 2:35 PM

## 2012-04-15 ENCOUNTER — Inpatient Hospital Stay (HOSPITAL_COMMUNITY)
Admit: 2012-04-15 | Discharge: 2012-04-15 | Disposition: A | Discharge status: Home / Self Care | Payer: Medicare Other | Attending: Neurology | Admitting: Neurology

## 2012-04-15 ENCOUNTER — Encounter (HOSPITAL_COMMUNITY): Payer: Self-pay | Admitting: Internal Medicine

## 2012-04-15 DIAGNOSIS — E034 Atrophy of thyroid (acquired): Secondary | ICD-10-CM | POA: Diagnosis present

## 2012-04-15 DIAGNOSIS — E039 Hypothyroidism, unspecified: Secondary | ICD-10-CM

## 2012-04-15 DIAGNOSIS — S066XAA Traumatic subarachnoid hemorrhage with loss of consciousness status unknown, initial encounter: Secondary | ICD-10-CM

## 2012-04-15 DIAGNOSIS — S066X9A Traumatic subarachnoid hemorrhage with loss of consciousness of unspecified duration, initial encounter: Secondary | ICD-10-CM

## 2012-04-15 HISTORY — DX: Hypothyroidism, unspecified: E03.9

## 2012-04-15 LAB — BASIC METABOLIC PANEL
Chloride: 95 mEq/L — ABNORMAL LOW (ref 96–112)
GFR calc Af Amer: >90 mL/min (ref >90)
Potassium: 3.7 mEq/L (ref 3.5–5.1)

## 2012-04-15 LAB — CULTURE, BLOOD (ROUTINE X 2): Culture: NO GROWTH

## 2012-04-15 MED ORDER — ALPRAZOLAM 0.5 MG PO TABS: 0.5000 mg | ORAL_TABLET | Freq: Every day | ORAL | Status: DC

## 2012-04-15 MED ORDER — ACETAMINOPHEN 325 MG PO TABS: 650.0000 mg | ORAL_TABLET | Freq: Four times a day (QID) | ORAL | Status: DC | PRN

## 2012-04-15 MED ORDER — STROKE: EARLY STAGES OF RECOVERY BOOK
Freq: Once | Status: AC
  Administered 2012-04-15: 17:00:00
  Filled 2012-04-15: qty 1

## 2012-04-15 MED ORDER — LEVOTHYROXINE SODIUM 25 MCG PO TABS: 25.0000 ug | ORAL_TABLET | Freq: Every day | ORAL | Status: AC

## 2012-04-15 NOTE — Clinical Social Work Note (Signed)
Patient anticipated to be ready to discharge to [facility] tomorrow.  FL2 reviewed w RN and updated.  Discharge packet prepared and placed w shadow chart for transport.  Daughter Maye Hides informed that patient w discharge via EMS to [facility] tomorrow.  Admissions at [facility] informed and agreeable, discharge summary faxed via [facility] to facility.  Santa Genera, LCSW Clinical Social Worker [phone])

## 2012-04-15 NOTE — Procedures (Signed)
[facility] Ebony [name] A. Gerilyn Pilgrim, MD     [email or web address]        NAMEBRITT, Ebony              ACCOUNT NO.:  [id number]  MEDICAL RECORD NO.:  [id number]  LOCATION:  A329                          FACILITY:  [facility]  PHYSICIAN:  Waylynn Benefiel A. Gerilyn Pilgrim, M.D. DATE OF BIRTH:  1932-01-31  DATE OF PROCEDURE: DATE OF DISCHARGE:                             EEG INTERPRETATION   HISTORY:  This is a 79 year old lady who was found down unresponsive. The study is being done to evaluate for possible seizure etiology.  MEDICATIONS:  Xanax, Cardizem, Synthroid, Toprol, Pamelor, Zoloft.  ANALYSIS:  A 16-channel recording using standard 10/20 measurements is conducted for 20 minutes.  There is a well-formed posterior dominant rhythm of 8 Hz, which attenuates with eye opening.  There is beta activity observed in the frontal areas.  Awake and sleep activities are recorded.  K complexes and sleep spindles are observed.  Photic stimulation and hypoventilation were not carried out.  There is no focal or lateralized slowing.  There are couple of sharp wave activity as phase reversed at T3.  IMPRESSION:  Slightly abnormal recording, showing a couple of sharp wave activities involving the left temporal area, which possibly could represent epileptic focus.  I would, however, not recommend treatment at this time, but continue the observation.     Moxie Kalil A. Gerilyn Pilgrim, M.D.     KAD/MEDQ  D:  04/15/2012  T:  04/15/2012  Job:  [id number]

## 2012-04-15 NOTE — Discharge Summary (Addendum)
Physician Discharge Summary  Patient ID: Ebony [name] MRN: [id number] DOB/AGE: 1931-04-17 79 y.o.  Admit date: 04/09/2012 Discharge date: 04/15/2012  Discharge Diagnoses:     Acute Delirium   SDH (subdural hematoma)   Subarachnoid hemorrhage following injury   Acute ischemic stroke   HTN (hypertension)   traumatic leg ulcer   Dehydration   Atrial fibrillation with rapid ventricular response, transient   Right wrist contusion   Right hand contusion   Fall   Anxiety disorder   Hypokalemia   Primary lactic acidemia   Abnormality of gait, chronic Hypothyroidism, new diagnosis  Tests pending at the time of discharge: EEG     Medication List    STOP taking these medications       triamterene-hydrochlorothiazide 37.5-25 MG per capsule  Commonly known as:  DYAZIDE      TAKE these medications       acetaminophen 325 MG tablet  Commonly known as:  TYLENOL  Take 2 tablets (650 mg total) by mouth every 6 (six) hours as needed.     ALPRAZolam 0.5 MG tablet  Commonly known as:  XANAX  Take 1 tablet (0.5 mg total) by mouth at bedtime. For anxiety     diltiazem 120 MG 24 hr capsule  Commonly known as:  CARDIZEM CD  Take 120 mg by mouth daily.     levothyroxine 25 MCG tablet  Commonly known as:  SYNTHROID, LEVOTHROID  Take 1 tablet (25 mcg total) by mouth daily before breakfast.     metoprolol succinate 100 MG 24 hr tablet  Commonly known as:  TOPROL-XL  Take 100 mg by mouth every morning.     multivitamin with minerals Tabs  Take 1 tablet by mouth every morning.     nortriptyline 50 MG capsule  Commonly known as:  PAMELOR  Take 50 mg by mouth at bedtime.     sertraline 100 MG tablet  Commonly known as:  ZOLOFT  Take 100 mg by mouth every morning.             Future Appointments Provider Department Dept Phone   04/15/2012 2:30 PM [facility] EEG [phone]     followup with nursing home provider within a  week  Disposition: Skilled nursing facility  Discharged Condition: Stable  Consults: Treatment Team:  Beryle Beams, MD  Labs:    Sodium    136  134 131 129 133    Sodium   Potassium    3.3  3.2 2.9 4.0 3.7    Potassium   Chloride    98  98 97 96 95    Chloride   CO2    24  23 24 25 27     CO2   BUN    9  10 11 10 12     BUN   Creatinine, Ser    0.62  0.58 0.58 0.54 0.58    Creatinine, Ser   Calcium    8.9  8.6 8.3 8.6 9.1    Calcium   GFR calc non Af Amer    84  86 86 88 86    GFR calc non Af Amer   GFR calc Af Amer    >90  >90 >90 >90 >90    GFR calc Af Amer   Glucose, Bld    142  92 90 93 97    Glucose, Bld   Magnesium    1.4   1.5  Magnesium   Alkaline Phosphatase    134         Alkaline Phosphatase   Albumin    3.6         Albumin   AST    66         AST   ALT    33         ALT   Total Protein    6.8         Total Protein   Total Bilirubin    0.7         Total Bilirubin   CARDIAC PROFILE    Total CK    643         Total CK   Troponin I    <0.30         Troponin I   LIPID PROFILE    Cholesterol      168       Cholesterol   Triglycerides      89       Triglycerides   HDL      52       HDL   LDL Cholesterol      98       LDL Cholesterol   VLDL      18       VLDL   Total CHOL/HDL Ratio      3.2       Total CHOL/HDL Ratio   OTHER CHEM    Lactic Acid, Venous    2.9         Lactic Acid, Venous   CBC    WBC    11.7  10.5 8.9      WBC   RBC    4.52  3.70 3.28      RBC   Hemoglobin    12.9  10.7 9.7      Hemoglobin   HCT    38.2  32.2 28.3      HCT   MCV    84.5  87.0 86.3      MCV   MCH    28.5  28.9 29.6      MCH   MCHC    33.8  33.2 34.3      MCHC   RDW    14.8  15.7 15.3      RDW   Platelets    230  196 187      Platelets   DIFFERENTIAL    Neutrophils Relative    78         Neutrophils Relative   Lymphocytes Relative    16         Lymphocytes Relative   Monocytes Relative    6         Monocytes Relative   Eosinophils Relative    0         Eosinophils Relative    Basophils Relative    0         Basophils Relative   Neutro Abs    7.4         Neutro Abs   Lymphs Abs    1.5         Lymphs Abs   Monocytes Absolute    0.6         Monocytes Absolute   Eosinophils Absolute    0.0         Eosinophils Absolute   Basophils Absolute    0.0  Basophils Absolute   PROTIME W/ INR    Prothrombin Time    12.7         Prothrombin Time   INR    0.96         INR   PTT    aPTT    30         aPTT   DIABETES    Glucose, Bld    142  92 90 93 97    Glucose, Bld   THYROID    TSH    4.839         TSH   Free T4      1.42       Free T4   T3, Free      2.1       T3, Free   URINALYSIS    Color, Urine    YELLOW   YELLOW      Color, Urine   APPearance    CLEAR   CLEAR      APPearance   Specific Gravity, Urine    1.010   >1.030      Specific Gravity, Urine   pH    7.5   5.5      pH   Glucose, UA    NEGATIVE   NEGATIVE      Glucose, UA   Bilirubin Urine    NEGATIVE   SMALL      Bilirubin Urine   Ketones, ur    TRACE   TRACE      Ketones, ur   Protein, ur    NEGATIVE   TRACE      Protein, ur   Urobilinogen, UA    0.2   1.0      Urobilinogen, UA   Nitrite    NEGATIVE   NEGATIVE      Nitrite   Leukocytes, UA    NEGATIVE   NEGATIVE      Leukocytes, UA   Hgb urine dipstick    SMALL   TRACE      Hgb urine dipstick   WBC, UA    0-2   0-2      WBC, UA   RBC / HPF    0-2   0-2      RBC / HPF   Squamous Epithelial / LPF    RARE   FEW      Squamous Epithelial / LPF   Bacteria, UA    RARE   FEW      Bacteria, UA   Casts       HYALINE CASTS      Casts   TOX, BLOOD    Alcohol, Ethyl (B)    <11         Alcohol, Ethyl (B)   TOX, URINE    Amphetamines    NONE DETECTED         Amphetamines   Barbiturates    NONE DETECTED         Barbiturates   Benzodiazepines    NONE DETECTED         Benzodiazepines   Opiates    NONE DETECTED         Opiates   Cocaine    NONE DETECTED         Cocaine   Tetrahydrocannabinol    NONE DETECTED   Recent Results (from the past 240 hour(s))  URINE  CULTURE     Status: None   Collection Time  04/09/12 11:14 PM      Result Value Range Status   Specimen Description URINE, CLEAN CATCH   Final   Special Requests Normal   Final   Culture  Setup Time 04/10/2012 22:53   Final   Colony Count NO GROWTH   Final   Culture NO GROWTH   Final   Report Status 04/11/2012 FINAL   Final  CULTURE, BLOOD (ROUTINE X 2)     Status: None   Collection Time    04/10/12  1:35 AM      Result Value Range Status   Specimen Description BLOOD RIGHT ANTECUBITAL   Final   Special Requests BOTTLES DRAWN AEROBIC AND ANAEROBIC 7CC   Final   Culture NO GROWTH 5 DAYS   Final   Report Status 04/15/2012 FINAL   Final  CULTURE, BLOOD (ROUTINE X 2)     Status: None   Collection Time    04/10/12  1:39 AM      Result Value Range Status   Specimen Description BLOOD RIGHT ARM   Final   Special Requests BOTTLES DRAWN AEROBIC AND ANAEROBIC 6CC   Final   Culture NO GROWTH 5 DAYS   Final   Report Status 04/15/2012 FINAL   Final  MRSA PCR SCREENING     Status: None   Collection Time    04/10/12  4:00 AM      Result Value Range Status   MRSA by PCR NEGATIVE  NEGATIVE Final   Comment:            The GeneXpert MRSA Assay (FDA     approved for NASAL specimens     only), is one component of a     comprehensive MRSA colonization     surveillance program. It is not     intended to diagnose MRSA     infection nor to guide or     monitor treatment for     MRSA infections.  CULTURE, BLOOD (ROUTINE X 2)     Status: None   Collection Time    04/12/12  2:54 PM      Result Value Range Status   Specimen Description BLOOD RIGHT HAND   Final   Special Requests     Final   Value: BOTTLES DRAWN AEROBIC AND ANAEROBIC AEB=7CC ANA=5CC   Culture NO GROWTH 3 DAYS   Final   Report Status PENDING   Incomplete  CULTURE, BLOOD (ROUTINE X 2)     Status: None   Collection Time    04/12/12  3:28 PM      Result Value Range Status   Specimen Description BLOOD RIGHT ARM   Final   Special  Requests     Final   Value: BOTTLES DRAWN AEROBIC AND ANAEROBIC AEB=8CC ANA=4CC   Culture NO GROWTH 3 DAYS   Final   Report Status PENDING   Incomplete    Diagnostics:  Dg Wrist Complete Right  04/12/2012  *RADIOLOGY REPORT*  Clinical Data: Swelling and pain  RIGHT WRIST - COMPLETE 3+ VIEW  Comparison: None.  Findings: No fracture or dislocation.  Advanced degenerative change most notable at the intercarpal, and carpometacarpal joint radially. Osteopenia.  Mild soft tissue swelling.  IMPRESSION: Degenerative changes as described.  Mild soft tissue swelling.   Original Report Authenticated By: Davonna Belling, M.D.    Ct Head Wo Contrast  04/13/2012  *RADIOLOGY REPORT*  Clinical Data: Subarachnoid and subdural hematomas.  CT HEAD WITHOUT CONTRAST  Technique:  Contiguous axial  images were obtained from the base of the skull through the vertex without contrast.  Comparison: MRI brain 04/11/2012.  CT head without contrast 04/09/2012.  Findings: The right convexity subdural collection is of lower density.  There is no significant increase in size or evidence for new hemorrhage at that location.  Left subdural blood products along the anterior falx is less conspicuous as well.  Blood products along the right tentorium may be secondary to a layering and patient position.  Blood products along the right tentorium are more prominent than on the prior study.  Interventricular hemorrhage is similar to the prior MRI, but new from the CT scan. Atrophy white matter disease is otherwise stable.  IMPRESSION:  1.  Decreased conspicuity of subdural blood over the right frontal convexity and adjacent to the anterior falx on the left. 2.  New blood products are evident along the tentorium.  These could be positional. 3.  Stable intraventricular blood products. 4.  Stable atrophy and white matter disease.   Original Report Authenticated By: Marin Roberts, M.D.    Ct Head Wo Contrast  04/09/2012  *RADIOLOGY REPORT*   Clinical Data:  The patient was found unresponsive at home.  Pain and agitation.  Altered mental status.  CT HEAD WITHOUT CONTRAST CT CERVICAL SPINE WITHOUT CONTRAST  Technique:  Multidetector CT imaging of the head and cervical spine was performed following the standard protocol without intravenous contrast.  Multiplanar CT image reconstructions of the cervical spine were also generated.  Comparison:  CT head 05/22/2011.  CT head and cervical spine 05/15/2011  CT HEAD  Findings: There is an acute subdural hematoma along the right anterior frontal region measuring 9.3 mm depth.  There is also minimal left parafalcine subdural hematoma anteriorly, measuring maximal depth of about 4 mm.  There is no mass effect or midline shift.  The no intraparenchymal, subarachnoid, or intraventricular hemorrhage is identified.  There is mild diffuse cerebral atrophy.  Ventricular dilatation consistent with central atrophy.  Gray-white matter junctions are distinct.  Basal cisterns are not effaced.  Low attenuation change in the periventricular white matter is consistent with small vessel ischemia.  No depressed skull fractures.  Visualized paranasal sinuses and mastoid air cells are not opacified.  Suggestion of nasal bone fractures, incompletely visualized.  These may be acute.  IMPRESSION: Acute subdural hematomas in the right anterior frontal and left anterior parafalcine regions.  No mass effect or midline shift.  CT CERVICAL SPINE  Findings: There is reversal of the usual cervical lordosis and anterior subluxation of about 3 mm at C4-5.  This appears stable since the previous study is likely due to degenerative change. There are degenerative changes diffusely throughout cervical spine with narrowed cervical interspaces assess endplate hypertrophic changes.  Degenerate changes throughout the cervical facet joints. Lateral masses of C1 are symmetrical.  The odontoid process is intact.  No vertebral compression deformities.  No  prevertebral soft tissue swelling.  No focal bone lesion or bone destruction. Bone cortex and trabecular architecture appear intact.  No abnormal paraspinal soft tissue infiltration.  IMPRESSION: Diffuse degenerative changes throughout the cervical spine.  Stable anterior subluxation of C4 on C5.  No acute displaced fractures identified.  Results were discussed by telephone with Dr. Bebe Shaggy at the 2230 hours on 04/09/2012.   Original Report Authenticated By: Burman Nieves, M.D.    Ct Cervical Spine Wo Contrast  04/09/2012  *RADIOLOGY REPORT*  Clinical Data:  The patient was found unresponsive at home.  Pain and agitation.  Altered mental status.  CT HEAD WITHOUT CONTRAST CT CERVICAL SPINE WITHOUT CONTRAST  Technique:  Multidetector CT imaging of the head and cervical spine was performed following the standard protocol without intravenous contrast.  Multiplanar CT image reconstructions of the cervical spine were also generated.  Comparison:  CT head 05/22/2011.  CT head and cervical spine 05/15/2011  CT HEAD  Findings: There is an acute subdural hematoma along the right anterior frontal region measuring 9.3 mm depth.  There is also minimal left parafalcine subdural hematoma anteriorly, measuring maximal depth of about 4 mm.  There is no mass effect or midline shift.  The no intraparenchymal, subarachnoid, or intraventricular hemorrhage is identified.  There is mild diffuse cerebral atrophy.  Ventricular dilatation consistent with central atrophy.  Gray-white matter junctions are distinct.  Basal cisterns are not effaced.  Low attenuation change in the periventricular white matter is consistent with small vessel ischemia.  No depressed skull fractures.  Visualized paranasal sinuses and mastoid air cells are not opacified.  Suggestion of nasal bone fractures, incompletely visualized.  These may be acute.  IMPRESSION: Acute subdural hematomas in the right anterior frontal and left anterior parafalcine regions.  No  mass effect or midline shift.  CT CERVICAL SPINE  Findings: There is reversal of the usual cervical lordosis and anterior subluxation of about 3 mm at C4-5.  This appears stable since the previous study is likely due to degenerative change. There are degenerative changes diffusely throughout cervical spine with narrowed cervical interspaces assess endplate hypertrophic changes.  Degenerate changes throughout the cervical facet joints. Lateral masses of C1 are symmetrical.  The odontoid process is intact.  No vertebral compression deformities.  No prevertebral soft tissue swelling.  No focal bone lesion or bone destruction. Bone cortex and trabecular architecture appear intact.  No abnormal paraspinal soft tissue infiltration.  IMPRESSION: Diffuse degenerative changes throughout the cervical spine.  Stable anterior subluxation of C4 on C5.  No acute displaced fractures identified.  Results were discussed by telephone with Dr. Bebe Shaggy at the 2230 hours on 04/09/2012.   Original Report Authenticated By: Burman Nieves, M.D.    Mr Brain Wo Contrast  04/11/2012  *RADIOLOGY REPORT*  Clinical Data: Altered mental status. History of multiple falls, with worsening mental status most recently.  MRI HEAD WITHOUT CONTRAST  Technique:  Multiplanar, multiecho pulse sequences of the brain and surrounding structures were obtained according to standard protocol without intravenous contrast.  Comparison: CT head 04/09/2012.  Also previous CT head 05/22/2011.  Findings: There is a tiny area of restricted diffusion in the right posterior superior temporal cortex deep within the post Sylvian fissure (image 13 series 3) consistent with a small acute infarct. No other areas of acute infarction are seen.  Multiple acute areas of intracranial hemorrhage are present as suspected from prior CT.  Over the right frontal convexity, there is an 8-10 mm thick acute subdural hematoma.  This hematoma is associated with what is likely a larger  right subdural hygroma. Small amount of acute subdural hemorrhage is noted over the right occipital lobe posteriorly measuring no more than 2-3 mm thick.  A small amount of subdural hemorrhage can be seen over the left frontal convexity anteriorly (image 21 series 8), non compressive. Small amount of interhemispheric subdural hematoma is redemonstrated in the frontal region (image 20 series 8).  There is a moderate amount of intraventricular blood which layers posteriorly in the occipital horns (image 13 series 8).  There is subarachnoid hemorrhage, best seen on  FLAIR sequences, which can be seen in the prepontine cistern and fourth ventricle outlet foramina.  There are no areas of parenchymal hemorrhage.  There is moderate atrophy without midline shift.  Extensive white matter signal abnormality representing chronic microvascular ischemic change can be seen in the periventricular and subcortical regions.  The calvarium is intact.  There is no visible skull fracture. Normal pituitary and cerebellar tonsils.  Upper cervical region unremarkable.  No acute sinus or mastoid disease.  IMPRESSION: Previously identified areas of subdural hematoma and hygroma are roughly stable compared with prior CT. This is most prominent in the right frontal region.  There is no significant midline shift or significant brain compression.  Small area of acute infarction no more than 2-3 mm in diameter affects the right posterior superior temporal cortex deep in the posterior Sylvian fissure.  Subarachnoid and intraventricular hemorrhage without hydrocephalus.  Atrophy with chronic microvascular ischemic change.   Original Report Authenticated By: Davonna Belling, M.D.    US Carotid Duplex Bilateral  04/11/2012  *RADIOLOGY REPORT*  Clinical Data: History of stroke, hypertension, altered mental status  BILATERAL CAROTID DUPLEX ULTRASOUND  Technique: Gray scale imaging, color Doppler and duplex ultrasound was performed of bilateral carotid  and vertebral arteries in the neck.  Comparison:  Brain MRI - earlier same day  Criteria:  Quantification of carotid stenosis is based on velocity parameters that correlate the residual internal carotid diameter with NASCET-based stenosis levels, using the diameter of the distal internal carotid lumen as the denominator for stenosis measurement.  The following velocity measurements were obtained:                   PEAK SYSTOLIC/END DIASTOLIC RIGHT ICA:                        115/38cm/sec CCA:                        98/22cm/sec SYSTOLIC ICA/CCA RATIO:     1.1 a DIASTOLIC ICA/CCA RATIO:    1.74 ECA:                        73cm/sec  LEFT ICA:                        111/10cm/sec CCA:                        101/22cm/sec SYSTOLIC ICA/CCA RATIO:     1.09 DIASTOLIC ICA/CCA RATIO:    0.45 ECA:                        111cm/sec  Findings:  RIGHT CAROTID ARTERY: There is no gray-scale evidence of significant atherosclerotic plaque within the right carotid system. No elevated peak systolic velocity with the right internal carotid artery to suggest hemodynamically significant stenosis.  RIGHT VERTEBRAL ARTERY:  Antegrade flow  LEFT CAROTID ARTERY: There is no gray-scale evidence of significant atherosclerotic plaque of the left carotid system. Isolated borderline elevated peak systolic velocity within the mid aspect of the left internal carotid artery is felt to be factitiously elevated due to sampling error.  Non-elevated peak systolic velocities are seen within the proximal and distal aspects of the left internal carotid artery.  LEFT VERTEBRAL ARTERY:  Antegrade flow  IMPRESSION:  Unremarkable carotid Doppler ultrasound without evidence of significant atherosclerotic plaque  within either carotid system or definitive evidence of hemodynamically significant stenosis.   Original Report Authenticated By: Tacey Ruiz, MD    Dg Chest Port 1 View  04/12/2012  *RADIOLOGY REPORT*  Clinical Data: Possible aspiration pneumonia.   PORTABLE CHEST - 1 VIEW  Comparison: 12/08/2012  Findings: Cardiomegaly.  Chronic bronchitic change.  No infiltrate or failure.  Osteopenia.  IMPRESSION: Cardiomegaly with chronic bronchitic change as described.  No active infiltrates.  Stable appearance from priors.   Original Report Authenticated By: Davonna Belling, M.D.    Dg Chest Port 1 View  04/10/2012  *RADIOLOGY REPORT*  Clinical Data: Altered mental status.  PORTABLE CHEST - 1 VIEW  Comparison: None.  Findings: Normal heart size and pulmonary vascularity.  Central interstitial changes and peribronchial thickening suggesting chronic bronchitis.  Linear atelectasis versus vascular crowding in the left lung base.  No focal consolidation or airspace disease. No blunting of costophrenic angles.  No pneumothorax.  Mediastinal contours appear intact.  Tortuous aorta.  Degenerative changes in the spine.  IMPRESSION: Chronic bronchitic changes.  No evidence of active consolidation.   Original Report Authenticated By: Burman Nieves, M.D.    Dg Hand Complete Right  04/12/2012  *RADIOLOGY REPORT*  Clinical Data: Pain and swelling.  RIGHT HAND - COMPLETE 3+ VIEW  Comparison: None.  Findings: Moderate soft tissue swelling.  Advanced degenerative change throughout the metacarpophalangeal and interphalangeal joints.  No fracture or dislocation.  IMPRESSION: As above.   Original Report Authenticated By: Davonna Belling, M.D.     Procedures:  EKG: On admissionSinus tachycardia ST & T wave abnormality, consider inferior ischemia ST & T wave abnormality, consider anterolateral ischemia  Repeat EKG on 2/10:  Atrial fibrillation with rapid ventricular response ST & T wave abnormality, consider inferior ischemia  Echocardiogram Left ventricle: The cavity size was normal. Mild tomoderate LVH with disproportionate upper septal thickening. Systolic function was normal. Wall motion was normal; there were no regional wall motion abnormalities. - Atrial septum: No  defect or patent foramen ovale was identified.  DNR   Hospital Course: See H&P for complete admission details. The patient is a 79 year old white female with frequent falls and previous subdural hematoma and traumatic subarachnoid hemorrhage. She was brought to the emergency room with confusion. In the emergency room, she required Haldol and Ativan to be treated. CAT scan of the brain showed small subdural hematoma. She also had a potassium of 2.8 and mildly elevated CPK. On exam, she was uncooperative. She had dry mucous membranes. She had a chronic 4 cm pretibial ulcer on the right. The ED physician and spoke with Dr. Newell Coral, neurosurgeon who felt that patient did not require operative intervention with regard to her intracranial hemorrhage. The patient was therefore admitted to the intensive care unit at [facility]. Ultimately, her encephalopathy was felt to be multifactorial, related to dehydration, subdural hematomas, possibly an element of benzodiazepine withdrawal. MRI also revealed small subarachnoid hemorrhage with intraventricular blood and small likely clinically insignificant ischemic stroke involving the right posterior superior temporal cortex deep in the posterior Sylvian fissure. While in the intensive care unit, the patient did have an episode of transient atrial fibrillation with rapid ventricular response. She has no known history of same, but this may have contributed to the ischemic stroke. She was not a candidate for anticoagulation, antiplatelet or TPA because of her intracranial hemorrhage and history of recurrent falls. Neurology was consulted and recommended also an EEG. Doubt will be done today, but will not likely  be read until next week. She has been seen by physical therapy and occupational therapy who have recommended skilled nursing facility. She will be stable for discharge tomorrow after EEG is done this afternoon. The EEG result will need to be followed up. She had  no witnessed seizure activity however. She had been living alone prior to admission. She is a history of recurrent falls and noncompliance with using her walker and should no longer live alone. After short-term skilled nursing facility, she plans to live with her daughter. Her potassium was repleted. She had a single episode of fever, but workup was negative. Initially, she was started on antibiotic for possible leg infection but this was resolved within 24 hours and the antibiotic was stopped. Patient also was found to have an elevated TSH and low free T3, so started on low-dose Synthroid. Patient's encephalopathy/delirium has resolved and she is likely at her clinical baseline. Code status on admission was DO NOT RESUSCITATE and this will be continued. Total time on the day of discharge greater than 30 minutes  Discharge Exam:  Blood pressure 137/83, pulse 87, temperature 98.8 F (37.1 C), temperature source Oral, resp. rate 18, height 5\' 5"  (1.651 m), weight 65.4 kg (144 lb 2.9 oz), SpO2 93.00%.  General: Alert. Eating lunch. Comfortable. Appropriate. Lungs clear to auscultation bilaterally without wheeze rhonchi or rales Cardiovascular regular rate rhythm without murmurs gallops rubs Abdomen soft nontender nondistended Extremities right hand and wrist much less swollen and tender. No pretibial edema  Signed: Christiane Ha 04/15/2012, 12:31 PM

## 2012-04-15 NOTE — Progress Notes (Signed)
Offsite portable EEG completed at [facility]. 

## 2012-04-15 NOTE — Progress Notes (Signed)
Patient ID: Ebony [name], female   DOB: 01/24/1932, 79 y.o.   MRN: [id number]  [facility] NEUROLOGY Onell Mcmath A. Gerilyn Pilgrim, MD     [email or web address]          Ebony [name] is an 79 y.o. female.   Assessment/Plan: 1. acute confusion which appears to be improving. She is probably close baseline. The acute confusion is likely multifactorial including intracranial hemorrhage indeed acutely L. in the hospital. 2. Multifactorial gait impairment including due to the osteoarthritis, aging ischemia white matter changes of the brain. EEG has been done. This will be followed. 3. Multiple intracranial hemorrhage which is essentially unchanged from previous imaging.  Overall, the patient is doing about the same. Cognition is unchanged. She apparently has been up sitting in the chair and using the potty next the bed. She is awake alert and lucid. She'll follow commands well. No dysarthria is reported. Pupils are reactive and his mustard is symmetric. She does have improved strength in upper 4/5. Extremities still weak being graded at 3/5. She continues to have some bruising of the upper extremities.  Repeat CT scan is reviewed in person. The scan is compared to the previous MRI and also the previous Head CT scan. Intraventricular hemorrhage is slightly improved. The right frontal subdural hemorrhage is now evolving into a more chronic hygroma type fluid collection. There is improvement however in this hemorrhage in general. The interhemispheric falcine hemorrhage is also improved. Hemorrhage involving the tentorium is more than previously seen although the radiologist thought this could be do to positioning. Overall, the scan seen unchanged to slightly improved.     Objective: Vital signs in last 24 hours: Temp:  [97.6 F (36.4 C)-98.8 F (37.1 C)] 98.2 F (36.8 C) (02/14 1350) Pulse Rate:  [82-98] 98 (02/14 1350) Resp:  [18-20] 20 (02/14 1350) BP: (126-159)/(75-90) 126/75 mmHg (02/14  1350) SpO2:  [92 %-98 %] 92 % (02/14 1350)  Intake/Output from previous day: 02/13 0701 - 02/14 0700 In: -  Out: 500 [Urine:500] Intake/Output this shift:   Nutritional status: Dysphagia   Lab Results: Results for orders placed during the hospital encounter of 04/09/12 (from the past 48 hour(s))  BASIC METABOLIC PANEL     Status: Abnormal   Collection Time    04/14/12  5:18 AM      Result Value Range   Sodium 129 (*) 135 - 145 mEq/L   Potassium 4.0  3.5 - 5.1 mEq/L   Comment: DELTA CHECK NOTED   Chloride 96  96 - 112 mEq/L   CO2 25  19 - 32 mEq/L   Glucose, Bld 93  70 - 99 mg/dL   BUN 10  6 - 23 mg/dL   Creatinine, Ser 4.09  0.50 - 1.10 mg/dL   Calcium 8.6  8.4 - 81.1 mg/dL   GFR calc non Af Amer 88 (*) >90 mL/min   GFR calc Af Amer >90  >90 mL/min   Comment:            The eGFR has been calculated     using the CKD EPI equation.     This calculation has not been     validated in all clinical     situations.     eGFR's persistently     <90 mL/min signify     possible Chronic Kidney Disease.  BASIC METABOLIC PANEL     Status: Abnormal   Collection Time    04/15/12  4:39 AM      Result  Value Range   Sodium 133 (*) 135 - 145 mEq/L   Potassium 3.7  3.5 - 5.1 mEq/L   Chloride 95 (*) 96 - 112 mEq/L   CO2 27  19 - 32 mEq/L   Glucose, Bld 97  70 - 99 mg/dL   BUN 12  6 - 23 mg/dL   Creatinine, Ser 0.45  0.50 - 1.10 mg/dL   Calcium 9.1  8.4 - 40.9 mg/dL   GFR calc non Af Amer 86 (*) >90 mL/min   GFR calc Af Amer >90  >90 mL/min   Comment:            The eGFR has been calculated     using the CKD EPI equation.     This calculation has not been     validated in all clinical     situations.     eGFR's persistently     <90 mL/min signify     possible Chronic Kidney Disease.    Lipid Panel No results found for this basename: CHOL, TRIG, HDL, CHOLHDL, VLDL, LDLCALC,  in the last 72 hours  Studies/Results: Ct Head Wo Contrast  04/13/2012  *RADIOLOGY REPORT*   Clinical Data: Subarachnoid and subdural hematomas.  CT HEAD WITHOUT CONTRAST  Technique:  Contiguous axial images were obtained from the base of the skull through the vertex without contrast.  Comparison: MRI brain 04/11/2012.  CT head without contrast 04/09/2012.  Findings: The right convexity subdural collection is of lower density.  There is no significant increase in size or evidence for new hemorrhage at that location.  Left subdural blood products along the anterior falx is less conspicuous as well.  Blood products along the right tentorium may be secondary to a layering and patient position.  Blood products along the right tentorium are more prominent than on the prior study.  Interventricular hemorrhage is similar to the prior MRI, but new from the CT scan. Atrophy white matter disease is otherwise stable.  IMPRESSION:  1.  Decreased conspicuity of subdural blood over the right frontal convexity and adjacent to the anterior falx on the left. 2.  New blood products are evident along the tentorium.  These could be positional. 3.  Stable intraventricular blood products. 4.  Stable atrophy and white matter disease.   Original Report Authenticated By: Marin Roberts, M.D.     Medications:  Scheduled Meds: . ALPRAZolam  0.5 mg Oral QHS  . antiseptic oral rinse  15 mL Mouth Rinse q12n4p  . diltiazem  120 mg Oral Daily  . levothyroxine  25 mcg Oral QAC breakfast  . metoprolol succinate  100 mg Oral BH-q7a  . nortriptyline  50 mg Oral QHS  . sertraline  100 mg Oral q morning - 10a   Continuous Infusions:  PRN Meds:.acetaminophen    LOS: 6 days   Seon Gaertner A. Gerilyn Pilgrim, M.D.  Diplomate, Biomedical engineer of Psychiatry and Neurology ( Neurology).

## 2012-04-16 NOTE — Progress Notes (Signed)
Pt transported by EMS via stretcher to [facility] for rehab.  Pt stable at time of transfer.  Pt's daughter, Darl Pikes, contacted to let her know that her mother had left [facility] and was on way to [place].  Pt's daughter to meet her at rehab facility.

## 2012-04-16 NOTE — Progress Notes (Signed)
Attempted to call report to [facility] at [phone] six times.  The phone continued to ring without answer.  Nurse also attempted to call report to main number at [phone] at least six times.  The phone continued to ring without answer.  AC and Press photographer notified.  AC was able to get an alternate number of [phone].  This number was contacted.  Rhonda answered the phone.  She stated that I could try [phone].  She said that I had reached her cell number but that I she could walk to where they were at.  Nurse stated that she would try the new number.  Nurse contacted [phone].  Cathy answered the phone.  Nurse told her that she was calling to give report to the nurse that would be taking care of Ms. Primeau.  She went to get the nurse and came back to the phone stating that she did not need report on Ms. Kerchner.

## 2012-04-17 LAB — CULTURE, BLOOD (ROUTINE X 2)

## 2012-05-23 LAB — URINALYSIS, MICROSCOPIC ONLY

## 2012-06-15 ENCOUNTER — Encounter: Payer: Self-pay | Admitting: Nurse Practitioner

## 2012-06-15 ENCOUNTER — Non-Acute Institutional Stay (SKILLED_NURSING_FACILITY): Payer: Medicare Other | Admitting: Nurse Practitioner

## 2012-06-15 DIAGNOSIS — F411 Generalized anxiety disorder: Secondary | ICD-10-CM

## 2012-06-15 DIAGNOSIS — F0390 Unspecified dementia without behavioral disturbance: Secondary | ICD-10-CM

## 2012-06-15 DIAGNOSIS — E039 Hypothyroidism, unspecified: Secondary | ICD-10-CM

## 2012-06-15 DIAGNOSIS — I10 Essential (primary) hypertension: Secondary | ICD-10-CM

## 2012-06-15 DIAGNOSIS — F419 Anxiety disorder, unspecified: Secondary | ICD-10-CM

## 2012-06-16 ENCOUNTER — Other Ambulatory Visit: Payer: Self-pay | Admitting: Geriatric Medicine

## 2012-06-16 MED ORDER — ALPRAZOLAM 0.5 MG PO TABS: 0.5000 mg | ORAL_TABLET | Freq: Every day | ORAL | Status: DC

## 2012-06-20 ENCOUNTER — Encounter: Payer: Self-pay | Admitting: Nurse Practitioner

## 2012-06-20 DIAGNOSIS — F0150 Vascular dementia without behavioral disturbance: Secondary | ICD-10-CM | POA: Insufficient documentation

## 2012-06-20 NOTE — Assessment & Plan Note (Signed)
Increase in memory loss- will start aricept 5mg  daily after weaning of nortriptyline is complete

## 2012-06-20 NOTE — Assessment & Plan Note (Signed)
At this time after discussion with pt and daughter will titrate nortriptyline off and will increase zoloft to 150mg 

## 2012-06-20 NOTE — Progress Notes (Signed)
Patient ID: Ebony [name], female   DOB: 08/17/1931, 79 y.o.   MRN: [id number]  Chief Complaint: medical management of chronic conditions   HPI:  79 year old female seen today at [facility] for routine visit. Pt with history of memory loss, anxiety and depression, HTN, a fib, hypothyroidism, subdural hematoma, and stroke. Currently with her daughter in her room without any concerns except increasing loss of memory. Daughter would like her mother put on medication at this time. Staff without any concerns at this time. Overall the patient is doing well and participating in therapy but will become a LTR of [facility]   Review of Systems:  Review of Systems  Constitutional: Negative for fever and chills.  Respiratory: Negative for cough, shortness of breath and wheezing.   Cardiovascular: Negative for chest pain, claudication and leg swelling.  Gastrointestinal: Negative for abdominal pain, diarrhea and constipation.  Genitourinary: Negative for dysuria and frequency.  Musculoskeletal: Negative for myalgias.  Skin: Negative for itching and rash.  Neurological: Positive for dizziness (history of vertigo) and weakness.  Psychiatric/Behavioral: Positive for depression and memory loss. The patient is nervous/anxious. The patient does not have insomnia.     Medications: Patient's Medications  New Prescriptions   No medications on file  Previous Medications   ACETAMINOPHEN (TYLENOL) 325 MG TABLET    Take 2 tablets (650 mg total) by mouth every 6 (six) hours as needed.   DILTIAZEM (CARDIZEM CD) 120 MG 24 HR CAPSULE    Take 120 mg by mouth daily.   LEVOTHYROXINE (SYNTHROID, LEVOTHROID) 25 MCG TABLET    Take 1 tablet (25 mcg total) by mouth daily before breakfast.   METOPROLOL (TOPROL-XL) 100 MG 24 HR TABLET    Take 100 mg by mouth every morning.     MULTIPLE VITAMIN (MULITIVITAMIN WITH MINERALS) TABS    Take 1 tablet by mouth every morning.   NORTRIPTYLINE (PAMELOR) 50 MG CAPSULE    Take 50 mg by  mouth at bedtime.     SERTRALINE (ZOLOFT) 100 MG TABLET    Take 100 mg by mouth every morning.   Modified Medications   Modified Medication Previous Medication   ALPRAZOLAM (XANAX) 0.5 MG TABLET ALPRAZolam (XANAX) 0.5 MG tablet      Take 1 tablet (0.5 mg total) by mouth at bedtime. For anxiety    Take 1 tablet (0.5 mg total) by mouth at bedtime. For anxiety  Discontinued Medications   No medications on file     Physical Exam: Physical Exam  Nursing note and vitals reviewed. Constitutional: She appears well-developed and well-nourished. No distress.  HENT:  Head: Normocephalic and atraumatic.  Right Ear: External ear normal.  Left Ear: External ear normal.  Nose: Nose normal.  Mouth/Throat: Oropharynx is clear and moist.  Eyes: Conjunctivae are normal. Pupils are equal, round, and reactive to light.  Neck: Normal range of motion. Neck supple.  Cardiovascular: Normal rate, regular rhythm and normal heart sounds.   Pulmonary/Chest: Effort normal and breath sounds normal.  Abdominal: Soft. Bowel sounds are normal.  Musculoskeletal: She exhibits no edema and no tenderness.  Currently in wc-- able to move all extremities   Neurological: She is alert.  Skin: Skin is warm and dry. She is not diaphoretic.  Psychiatric: She has a normal mood and affect.  \  Filed Vitals:   06/15/12 1224  BP: 135/76  Pulse: 60  Temp: 97.6 F (36.4 C)  Resp: 20      Labs reviewed: Basic Metabolic Panel:  Recent Labs  04/09/12 2136  04/13/12 0527 04/14/12 0518 04/15/12 0439  NA 133*  < > 131* 129* 133*  K 2.8*  < > 2.9* 4.0 3.7  CL 92*  < > 97 96 95*  CO2 27  < > 24 25 27   GLUCOSE 118*  < > 90 93 97  BUN 12  < > 11 10 12   CREATININE 0.73  < > 0.58 0.54 0.58  CALCIUM 9.9  < > 8.3* 8.6 9.1  MG 1.4*  --  1.5  --   --   < > = values in this interval not displayed.  Liver Function Tests:  Recent Labs  04/09/12 2136 04/10/12 0458  AST 53* 66*  ALT 32 33  ALKPHOS 148* 134*   BILITOT 0.8 0.7  PROT 7.3 6.8  ALBUMIN 4.1 3.6    CBC:  Recent Labs  04/09/12 2136 04/10/12 0458 04/12/12 0532 04/13/12 0527  WBC 9.5 11.7* 10.5 8.9  NEUTROABS 7.4  --   --   --   HGB 13.2 12.9 10.7* 9.7*  HCT 38.4 38.2 32.2* 28.3*  MCV 84.0 84.5 87.0 86.3  PLT 225 230 196 187    06/07/2012 Sodium  138    Chloride  100           CO2  29        Glucose  77          BUN  10              Creatinine  0.60            Calcium  9.3                 TSH  1.581     Assessment/Plan Unspecified hypothyroidism Patient is stable; continue current regimen. Will monitor and make changes as necessary.   HTN (hypertension) Patient is stable; continue current regimen. Will monitor and make changes as necessary.   Anxiety disorder At this time after discussion with pt and daughter will titrate nortriptyline off and will increase zoloft to 150mg    Dementia Increase in memory loss- will start aricept 5mg  daily after weaning of nortriptyline is complete

## 2012-06-20 NOTE — Assessment & Plan Note (Signed)
Patient is stable; continue current regimen. Will monitor and make changes as necessary.  

## 2012-06-21 ENCOUNTER — Telehealth: Payer: Self-pay | Admitting: Family Medicine

## 2012-06-21 NOTE — Telephone Encounter (Signed)
Patient called, she is in [facility] in [place] post stroke in February 2014.  She states she needs a podiatry referral to get her toenails cut.  States it will be June before doctor can get by there to do it for her. (not sure which doctor)  States that her daughter would be able to drive her to the appointment if we referred her.  (Not sure we can do this if patient's status is "skilled" at the rehab center) Please advise, told patient either a nurse or myself would call her back.  Patient states 2nd toe on left foot has large, thick nail that is very irritating and needs to be cut.

## 2012-06-21 NOTE — Telephone Encounter (Signed)
Sorry, as inpt elsewhere she needs to go thru their channels and management. We cannot pick and choose medical problems to manage when inpt elsewhere

## 2012-06-23 NOTE — Telephone Encounter (Signed)
Tried to call patient back at [facility] given, no answer

## 2012-07-13 ENCOUNTER — Encounter: Payer: Self-pay | Admitting: Nurse Practitioner

## 2012-07-13 ENCOUNTER — Non-Acute Institutional Stay (SKILLED_NURSING_FACILITY): Payer: Medicare Other | Admitting: Nurse Practitioner

## 2012-07-13 DIAGNOSIS — N390 Urinary tract infection, site not specified: Secondary | ICD-10-CM | POA: Insufficient documentation

## 2012-07-13 DIAGNOSIS — R634 Abnormal weight loss: Secondary | ICD-10-CM | POA: Insufficient documentation

## 2012-07-13 DIAGNOSIS — F411 Generalized anxiety disorder: Secondary | ICD-10-CM

## 2012-07-13 DIAGNOSIS — F419 Anxiety disorder, unspecified: Secondary | ICD-10-CM

## 2012-07-13 DIAGNOSIS — F0390 Unspecified dementia without behavioral disturbance: Secondary | ICD-10-CM

## 2012-07-13 NOTE — Assessment & Plan Note (Signed)
Currently on Aricept 5 mg will increase to 10 mg in 1 month if pt remains without side effects

## 2012-07-13 NOTE — Assessment & Plan Note (Signed)
Tolerating weaning off of nortriptyline and is now on Zoloft 150 mg daily no noted increase in anxiety Will change xanax hs to PRN

## 2012-07-13 NOTE — Progress Notes (Signed)
Patient ID: Ebony [name], female   DOB: [birth year], 79 y.o.   MRN: [id number]  Chief Complaint: medical management of chronic conditions   HPI:  79 year old female seen today at [facility] for routine visit. Pt with history of memory loss, anxiety and depression, HTN, a fib, hypothyroidism, subdural hematoma, and stroke.  Pt has recently been weaned of nortriptyline and Zoloft increased to 150 mg daily- tolerated this well and is without any increase in anxiety.  Was started on aricept 5 mg recently and has not had any noted side effects from this medications Recently UA was sent due to increase in confusion which revealed UTI- sensitivities resulted today.  Pt denies fevers chills, chest pain, shortness of breath, dysuria and reports she is doing well. Staff is without concerns at this time.  Review of Systems:  Review of Systems  Constitutional: Negative for fever, chills and malaise/fatigue.  Respiratory: Negative for shortness of breath.   Cardiovascular: Negative for chest pain and palpitations.  Gastrointestinal: Negative for heartburn, nausea, vomiting, abdominal pain, diarrhea and constipation.  Genitourinary: Negative for dysuria, urgency and frequency.  Musculoskeletal: Negative for myalgias.  Skin: Negative for itching and rash.  Neurological: Negative for dizziness and weakness.  Psychiatric/Behavioral: Positive for memory loss. Negative for depression. The patient does not have insomnia.      Medications: Patient's Medications  New Prescriptions   No medications on file  Previous Medications   ACETAMINOPHEN (TYLENOL) 325 MG TABLET    Take 2 tablets (650 mg total) by mouth every 6 (six) hours as needed.   ALPRAZOLAM (XANAX) 0.5 MG TABLET    Take 1 tablet (0.5 mg total) by mouth at bedtime. For anxiety   DILTIAZEM (CARDIZEM CD) 120 MG 24 HR CAPSULE    Take 120 mg by mouth daily.   DONEPEZIL (ARICEPT) 5 MG TABLET    Take 5 mg by mouth at bedtime as needed.   LEVOTHYROXINE  (SYNTHROID, LEVOTHROID) 25 MCG TABLET    Take 1 tablet (25 mcg total) by mouth daily before breakfast.   METOPROLOL (TOPROL-XL) 100 MG 24 HR TABLET    Take 100 mg by mouth every morning.     MULTIPLE VITAMIN (MULITIVITAMIN WITH MINERALS) TABS    Take 1 tablet by mouth every morning.   SERTRALINE (ZOLOFT) 100 MG TABLET    Take 150 mg by mouth every morning.   Modified Medications   No medications on file  Discontinued Medications   NORTRIPTYLINE (PAMELOR) 50 MG CAPSULE    Take 50 mg by mouth at bedtime.       Physical Exam: Physical Exam  Constitutional: She appears well-developed. No distress.  Neck: Normal range of motion.  Cardiovascular: Normal rate, regular rhythm and normal heart sounds.   Pulmonary/Chest: Effort normal and breath sounds normal.  Abdominal: Soft. Bowel sounds are normal. She exhibits no distension. There is no tenderness.  Musculoskeletal: She exhibits no edema and no tenderness.  Neurological: She is alert.  Skin: Skin is warm and dry. She is not diaphoretic.     Filed Vitals:   07/13/12 1128  BP: 126/78  Pulse: 64  Temp: 98.1 F (36.7 C)  Resp: 18  Weight: 118 lb 12.8 oz (53.887 kg)      Labs reviewed: Basic Metabolic Panel:  Recent Labs  40/98/11 2136  04/13/12 0527 04/14/12 0518 04/15/12 0439  NA 133*  < > 131* 129* 133*  K 2.8*  < > 2.9* 4.0 3.7  CL 92*  < >  97 96 95*  CO2 27  < > 24 25 27   GLUCOSE 118*  < > 90 93 97  BUN 12  < > 11 10 12   CREATININE 0.73  < > 0.58 0.54 0.58  CALCIUM 9.9  < > 8.3* 8.6 9.1  MG 1.4*  --  1.5  --   --   < > = values in this interval not displayed.  Liver Function Tests:  Recent Labs  04/09/12 2136 04/10/12 0458  AST 53* 66*  ALT 32 33  ALKPHOS 148* 134*  BILITOT 0.8 0.7  PROT 7.3 6.8  ALBUMIN 4.1 3.6    CBC:  Recent Labs  04/09/12 2136 04/10/12 0458 04/12/12 0532 04/13/12 0527  WBC 9.5 11.7* 10.5 8.9  NEUTROABS 7.4  --   --   --   HGB 13.2 12.9 10.7* 9.7*  HCT 38.4 38.2 32.2*  28.3*  MCV 84.0 84.5 87.0 86.3  PLT 225 230 196 187    Significant Diagnostic Results:  07/12/12 80,000 COLONIES/ML---ESCHERICHIA COLI   Assessment/Plan Anxiety disorder Tolerating weaning off of nortriptyline and is now on Zoloft 150 mg daily no noted increase in anxiety Will change xanax hs to PRN  Dementia Currently on Aricept 5 mg will increase to 10 mg in 1 month if pt remains without side effects   UTI (urinary tract infection) Will have pt take macroBID 100mg  BID for 10 days for UTI   Loss of weight RD following weights- supplements have been added- will cont to monitor

## 2012-07-13 NOTE — Assessment & Plan Note (Signed)
RD following weights- supplements have been added- will cont to monitor

## 2012-07-13 NOTE — Assessment & Plan Note (Signed)
Will have pt take macroBID 100mg  BID for 10 days for UTI

## 2012-07-18 NOTE — Telephone Encounter (Signed)
Will close phone message and address if patient calls back

## 2012-07-18 NOTE — Telephone Encounter (Signed)
Tried to call patient back at number given, no answer again

## 2012-07-19 ENCOUNTER — Encounter: Payer: Self-pay | Admitting: Nurse Practitioner

## 2012-07-19 ENCOUNTER — Non-Acute Institutional Stay (SKILLED_NURSING_FACILITY): Payer: Medicare Other | Admitting: Nurse Practitioner

## 2012-07-19 DIAGNOSIS — R112 Nausea with vomiting, unspecified: Secondary | ICD-10-CM

## 2012-07-19 NOTE — Progress Notes (Signed)
Patient ID: Ebony [name], female   DOB: 1931/07/13, 79 y.o.   MRN: [id number]  Nursing Home Location:  [facility]   Place of Service: SNF (31)   Chief Complaint: AV: nausea  HPI:  79 year old female who is a resident of [place]. staff reports pt is not eating and complaining of nausea which is worse when she does eat. Pt reports she has also been having diarrhea. Nausea and diarrhea started last week. No fevers or chills. Pt reports intermittent abdominal pain. Yesterday has episode of vomiting. Pt has previously been on antibiotic for UTI   Review of Systems:  Review of Systems  Constitutional: Positive for malaise/fatigue. Negative for fever and chills.  Respiratory: Negative for cough and shortness of breath.   Cardiovascular: Negative for chest pain.  Gastrointestinal: Positive for nausea, vomiting and diarrhea. Negative for heartburn, abdominal pain, constipation and blood in stool.  Genitourinary: Negative for dysuria, urgency and frequency.  Skin: Negative.   Neurological: Positive for weakness. Negative for headaches.     Medications: Patient's Medications  New Prescriptions   No medications on file  Previous Medications   ACETAMINOPHEN (TYLENOL) 325 MG TABLET    Take 2 tablets (650 mg total) by mouth every 6 (six) hours as needed.   ALPRAZOLAM (XANAX) 0.5 MG TABLET    Take 1 tablet (0.5 mg total) by mouth at bedtime. For anxiety   DILTIAZEM (CARDIZEM CD) 120 MG 24 HR CAPSULE    Take 120 mg by mouth daily.   DONEPEZIL (ARICEPT) 5 MG TABLET    Take 5 mg by mouth at bedtime as needed.   LEVOTHYROXINE (SYNTHROID, LEVOTHROID) 25 MCG TABLET    Take 1 tablet (25 mcg total) by mouth daily before breakfast.   METOPROLOL (TOPROL-XL) 100 MG 24 HR TABLET    Take 100 mg by mouth every morning.     MULTIPLE VITAMIN (MULITIVITAMIN WITH MINERALS) TABS    Take 1 tablet by mouth every morning.   SERTRALINE (ZOLOFT) 100 MG TABLET    Take 150 mg by mouth every morning.    Modified Medications   No medications on file  Discontinued Medications   No medications on file     Physical Exam:  Filed Vitals:   07/19/12 1027  BP: 122/85  Pulse: 64  Temp: 97 F (36.1 C)  Resp: 14  Weight: 118 lb 12.8 oz (53.887 kg)  SpO2: 92%    Physical Exam  Constitutional: She is oriented to person, place, and time. No distress.  Cardiovascular: Normal rate, regular rhythm and normal heart sounds.   Pulmonary/Chest: Effort normal and breath sounds normal.  Abdominal: Soft. Bowel sounds are normal. She exhibits no distension. There is tenderness.  Neurological: She is alert and oriented to person, place, and time.  Skin: Skin is warm and dry. She is not diaphoretic.     Assessment/Plan Nausea and vomiting  Will have nursing check for stool impaction Check stool for c diff x 3 Will check cbc with diff, cmp, amylase, lipase  KUB

## 2012-07-27 ENCOUNTER — Non-Acute Institutional Stay (SKILLED_NURSING_FACILITY): Payer: Medicare Other | Admitting: Nurse Practitioner

## 2012-07-27 ENCOUNTER — Encounter: Payer: Self-pay | Admitting: Nurse Practitioner

## 2012-07-27 DIAGNOSIS — E876 Hypokalemia: Secondary | ICD-10-CM

## 2012-07-27 DIAGNOSIS — R197 Diarrhea, unspecified: Secondary | ICD-10-CM | POA: Insufficient documentation

## 2012-07-27 DIAGNOSIS — R112 Nausea with vomiting, unspecified: Secondary | ICD-10-CM

## 2012-07-27 DIAGNOSIS — R634 Abnormal weight loss: Secondary | ICD-10-CM

## 2012-07-27 NOTE — Assessment & Plan Note (Signed)
vomitting and diarrhea have resolved and pt on KCL supplements- will increase potassium to 40 meq daily for 3 days and then recheck BMP

## 2012-07-27 NOTE — Assessment & Plan Note (Signed)
Pt is currently with increased appetite- recently started on remeron

## 2012-07-27 NOTE — Progress Notes (Signed)
Patient ID: Ebony [name], female   DOB: 02-03-1932, 79 y.o.   MRN: [id number]  Nursing Home Location:  [facility]   Place of Service: SNF (31)   Chief Complaint: acute visit- follow up labs and nausea and diarrhea  HPI:  Pt seen last week due to decreased appetite and N/V/D. Stool for cdiff was never sent however pts stool have improved and not liquid but more formed per pt. Pt reports her appetite has improved in the last few days and is currently not having any nausea, vomiting or diarrhea. Stools are still loose but much better than last week. No fevers or chills, abdominal discomfort, GERD or chest pain noted.   Review of Systems:  Review of Systems  Constitutional: Positive for weight loss. Negative for fever, chills and malaise/fatigue.  Respiratory: Negative for cough and shortness of breath.   Cardiovascular: Negative for chest pain and palpitations.  Gastrointestinal: Negative for heartburn, nausea, vomiting, abdominal pain, diarrhea and constipation.  Genitourinary: Negative for dysuria, urgency and frequency.  Musculoskeletal: Negative for myalgias.  Neurological: Positive for weakness. Negative for dizziness.     Medications: Patient's Medications  New Prescriptions   No medications on file  Previous Medications   ACETAMINOPHEN (TYLENOL) 325 MG TABLET    Take 2 tablets (650 mg total) by mouth every 6 (six) hours as needed.   ALPRAZOLAM (XANAX) 0.5 MG TABLET    Take 1 tablet (0.5 mg total) by mouth at bedtime. For anxiety   DILTIAZEM (CARDIZEM CD) 120 MG 24 HR CAPSULE    Take 120 mg by mouth daily.   DONEPEZIL (ARICEPT) 5 MG TABLET    Take 5 mg by mouth at bedtime as needed.   LEVOTHYROXINE (SYNTHROID, LEVOTHROID) 25 MCG TABLET    Take 1 tablet (25 mcg total) by mouth daily before breakfast.   METOPROLOL (TOPROL-XL) 100 MG 24 HR TABLET    Take 100 mg by mouth every morning.     MIRTAZAPINE (REMERON) 15 MG TABLET    Take 15 mg by mouth at bedtime.   MULTIPLE VITAMIN (MULITIVITAMIN WITH MINERALS) TABS    Take 1 tablet by mouth every morning.  Modified Medications   No medications on file  Discontinued Medications   SERTRALINE (ZOLOFT) 100 MG TABLET    Take 150 mg by mouth every morning.      Physical Exam:  Filed Vitals:   07/27/12 1130  BP: 126/78  Pulse: 82  Temp: 98.1 F (36.7 C)  Resp: 18   Physical Exam  Constitutional: No distress.  HENT:  Head: Normocephalic and atraumatic.  Mouth/Throat: Oropharynx is clear and moist.  Neck: Normal range of motion. Neck supple.  Cardiovascular: Normal rate, regular rhythm, normal heart sounds and intact distal pulses.   Pulmonary/Chest: Effort normal and breath sounds normal. No respiratory distress.  Abdominal: Soft. Bowel sounds are normal. She exhibits no distension. There is no tenderness.  Musculoskeletal: She exhibits no edema and no tenderness.  Neurological: She is alert.  Skin: Skin is warm and dry. She is not diaphoretic.      Labs reviewed:  WBC  11.3     H  4.0-10.5  K/uL  SLN       RBC  4.60        3.87-5.11  MIL/uL  SLN       Hemoglobin  12.3        12.0-15.0  g/dL  SLN       Hematocrit  35.6  L  36.0-46.0  %  SLN       MCV  77.4     L  78.0-100.0  fL  SLN       MCH  26.7        26.0-34.0  pg  SLN       MCHC  34.6        30.0-36.0  g/dL  SLN       RDW  91.4     H  11.5-15.5  %  SLN       Platelet Count  278        150-400  K/uL  SLN       Granulocyte %  64        43-77  %  SLN       Absolute Gran  7.3        1.7-7.7  K/uL  SLN       Lymph %  25        12-46  %  SLN       Absolute Lymph  2.8        0.7-4.0  K/uL  SLN       Mono %  9        3-12  %  SLN       Absolute Mono  1.0        0.1-1.0  K/uL  SLN       Eos %  2        0-5  %  SLN       Absolute Eos  0.2        0.0-0.7  K/uL  SLN       Baso %  0        0-1  %  SLN       Absolute Baso  0.0        0.0-0.1  K/uL  SLN       Smear Review    SLN  C    Comprehensive Metabolic Panel       Result:  7/82/9562 4:07 PM    ( Status: F )            Sodium  131     L  135-145  mEq/L  SLN       Potassium  3.3     L  3.5-5.3  mEq/L  SLN       Chloride  93     L  96-112  mEq/L  SLN       CO2  24        19-32  mEq/L  SLN       Glucose  80        70-99  mg/dL  SLN       BUN  13        6-23  mg/dL  SLN       Creatinine  0.45     L  0.50-1.10  mg/dL  SLN       Bilirubin, Total  0.7        0.3-1.2  mg/dL  SLN       Alkaline Phosphatase  113        39-117  U/L  SLN       AST/SGOT  24        0-37  U/L  SLN       ALT/SGPT  18        0-35  U/L  SLN       Total Protein  5.3     L  6.0-8.3  g/dL  SLN       Albumin  2.9     L  3.5-5.2  g/dL  SLN       Calcium  8.8        8.4-10.5  mg/dL  SLN      Amylase       Result: 07/23/2012 4:05 PM    ( Status: F )            Amylase  25        0-105  U/L  SLN      Lipase       Result: 07/23/2012 4:05 PM    ( Status: F )            Lipase  19        0-75  U/L    07/26/2012 BMP with Estimated GFR       Result: 12:06 PM    ( Status: F )            Sodium  134     L  135-145  mEq/L  SLN       Potassium  3.0     L  3.5-5.3  mEq/L  SLN       Chloride  98        96-112  mEq/L  SLN       CO2  27        19-32  mEq/L  SLN       Glucose  93        70-99  mg/dL  SLN       BUN  12        6-23  mg/dL  SLN       Creatinine  0.46     L  0.50-1.10  mg/dL  SLN       Calcium  8.8        8.4-10.5  mg/dL  SLN       Est GFR, African American  >89         mL/min  SLN       Est GFR, NonAfrican American  >89            Assessment/Plan Loss of weight Pt is currently with increased appetite- recently started on remeron  Hypokalemia vomitting and diarrhea have resolved and pt on KCL supplements- will increase potassium to 40 meq daily for 3 days and then recheck BMP   Diarrhea Improved- still having loose stools- will stop senna S BID and start colace 100 mg daily to avoid constipation  Nausea and vomitting- resoloved

## 2012-07-27 NOTE — Assessment & Plan Note (Signed)
Improved- still having loose stools- will stop senna S BID and start colace 100 mg daily to avoid constipation

## 2012-08-15 ENCOUNTER — Encounter: Payer: Self-pay | Admitting: Nurse Practitioner

## 2012-08-15 ENCOUNTER — Non-Acute Institutional Stay (SKILLED_NURSING_FACILITY): Payer: Medicare Other | Admitting: Nurse Practitioner

## 2012-08-15 DIAGNOSIS — F0390 Unspecified dementia without behavioral disturbance: Secondary | ICD-10-CM

## 2012-08-15 DIAGNOSIS — I10 Essential (primary) hypertension: Secondary | ICD-10-CM

## 2012-08-15 DIAGNOSIS — R634 Abnormal weight loss: Secondary | ICD-10-CM

## 2012-08-15 DIAGNOSIS — M542 Cervicalgia: Secondary | ICD-10-CM

## 2012-08-15 NOTE — Progress Notes (Signed)
Patient ID: Ebony [name], female   DOB: 1932/02/04, 79 y.o.   MRN: [id number]  Nursing Home Location:  [facility]   Place of Service: SNF (31)   Chief Complaint: medical management of chronic conditions   HPI:  79 year old female seen today at [facility] for routine visit. Pt with history of memory loss, anxiety and depression, HTN, a fib, hypothyroidism, subdural hematoma, and stroke.  Tolerating current medication well without any noted side effects. Reports stools have improved.   Review of Systems:  Review of Systems  Constitutional: Negative for fever and chills.  Respiratory: Negative for shortness of breath.   Cardiovascular: Negative for chest pain and leg swelling.  Gastrointestinal: Negative for heartburn, nausea, vomiting, abdominal pain, diarrhea and constipation.  Genitourinary: Negative for dysuria.  Skin: Negative.   Neurological: Negative for dizziness, weakness and headaches.  Psychiatric/Behavioral: Negative for depression and memory loss. The patient is not nervous/anxious.     Medications: Patient's Medications  New Prescriptions   No medications on file  Previous Medications   ACETAMINOPHEN (TYLENOL) 325 MG TABLET    Take 2 tablets (650 mg total) by mouth every 6 (six) hours as needed.   ALPRAZOLAM (XANAX) 0.5 MG TABLET    Take 1 tablet (0.5 mg total) by mouth at bedtime. For anxiety   DILTIAZEM (CARDIZEM CD) 120 MG 24 HR CAPSULE    Take 120 mg by mouth daily.   DONEPEZIL (ARICEPT) 5 MG TABLET    Take 5 mg by mouth at bedtime as needed.   LEVOTHYROXINE (SYNTHROID, LEVOTHROID) 25 MCG TABLET    Take 1 tablet (25 mcg total) by mouth daily before breakfast.   METOPROLOL (TOPROL-XL) 100 MG 24 HR TABLET    Take 100 mg by mouth every morning.     MIRTAZAPINE (REMERON) 15 MG TABLET    Take 15 mg by mouth at bedtime.   MULTIPLE VITAMIN (MULITIVITAMIN WITH MINERALS) TABS    Take 1 tablet by mouth every morning.  Modified Medications   No medications  on file  Discontinued Medications   No medications on file     Physical Exam:  Filed Vitals:   08/15/12 1010  BP: 136/84  Pulse: 78  Temp: 97.1 F (36.2 C)  Resp: 16    **Physical Exam  Nursing note and vitals reviewed. Constitutional: She is oriented to person, place, and time and well-developed, well-nourished, and in no distress. No distress.  HENT:  Head: Normocephalic and atraumatic.  Eyes: Conjunctivae and EOM are normal. Pupils are equal, round, and reactive to light.  Neck: Normal range of motion. Neck supple.  Cardiovascular: Normal rate, regular rhythm and normal heart sounds.   Pulmonary/Chest: Effort normal and breath sounds normal.  Abdominal: Soft. Bowel sounds are normal. She exhibits no distension. There is no tenderness.  Musculoskeletal: Normal range of motion. She exhibits no edema and no tenderness.  Neurological: She is alert and oriented to person, place, and time.  Skin: Skin is warm and dry. She is not diaphoretic.     Labs reviewed: CBC with Diff       Result: 08/09/2012 12:03 PM    ( Status: F )       C     WBC  7.5        4.0-10.5  K/uL  SLN       RBC  4.09        3.87-5.11  MIL/uL  SLN       Hemoglobin  10.8     L  12.0-15.0  g/dL  SLN       Hematocrit  32.7     L  36.0-46.0  %  SLN       MCV  80.0        78.0-100.0  fL  SLN       MCH  26.4        26.0-34.0  pg  SLN       MCHC  33.0        30.0-36.0  g/dL  SLN       RDW  40.9     H  11.5-15.5  %  SLN       Platelet Count  258        150-400  K/uL  SLN  C     Granulocyte %  40     L  43-77  %  SLN       Absolute Gran  3.0        1.7-7.7  K/uL  SLN       Lymph %  46        12-46  %  SLN       Absolute Lymph  3.4        0.7-4.0  K/uL  SLN       Mono %  11        3-12  %  SLN       Absolute Mono  0.8        0.1-1.0  K/uL  SLN       Eos %  2        0-5  %  SLN       Absolute Eos  0.2        0.0-0.7  K/uL  SLN       Baso %  0        0-1  %  SLN       Absolute Baso  0.0        0.0-0.1  K/uL   SLN       Smear Review    SLN  C    Comprehensive Metabolic Panel       Result: 08/09/2012 11:25 AM    ( Status: F )            Sodium  139        135-145  mEq/L  SLN       Potassium  3.6        3.5-5.3  mEq/L  SLN       Chloride  104        96-112  mEq/L  SLN       CO2  26        19-32  mEq/L  SLN       Glucose  104     H  70-99  mg/dL  SLN       BUN  14        6-23  mg/dL  SLN       Creatinine  0.73        0.50-1.10  mg/dL  SLN       Bilirubin, Total  0.4        0.3-1.2  mg/dL  SLN       Alkaline Phosphatase  98        39-117  U/L  SLN       AST/SGOT  26  0-37  U/L  SLN       ALT/SGPT  15        0-35  U/L  SLN       Total Protein  5.5     L  6.0-8.3  g/dL  SLN       Albumin  3.1     L  3.5-5.2  g/dL  SLN       Calcium  8.9        8.4-10.5  mg/dL  SLN      Amylase       Result: 08/09/2012 11:25 AM    ( Status: F )            Amylase  41        0-105  U/L  SLN      Lipase       Result: 08/09/2012 11:25 AM    ( Status: F )            Lipase  31        0-75  U/L  SLN      Assessment/Plan    1.   Loss of weight 783.21     Pt  Has not had significant weight gain- Remeron started last month - will cont supplements and  Remeron and cont to monitor weights   2.    HTN (hypertension) 401.9     stable   3.   Dementia, without behavioral disturbance 294.20     Will increase aricept to 10 mg daily at this time   4.   Pain in neck   biofreeze to side of neck and left should TID for 3 days and then as needed

## 2012-08-24 ENCOUNTER — Encounter: Payer: Self-pay | Admitting: *Deleted

## 2012-08-31 ENCOUNTER — Non-Acute Institutional Stay (SKILLED_NURSING_FACILITY): Payer: Medicare Other | Admitting: Nurse Practitioner

## 2012-08-31 ENCOUNTER — Encounter: Payer: Self-pay | Admitting: Nurse Practitioner

## 2012-08-31 DIAGNOSIS — R300 Dysuria: Secondary | ICD-10-CM

## 2012-08-31 DIAGNOSIS — M542 Cervicalgia: Secondary | ICD-10-CM

## 2012-08-31 NOTE — Progress Notes (Signed)
Patient ID: Ebony [name], female   DOB: 1931-05-01, 79 y.o.   MRN: [id number]  Nursing Home Location:  [facility]   Place of Service: SNF (31)   Chief Complaint: acute visit for ongoing dysuria and shoulder pain  HPI:  79 year old female seen today at [facility] for shoulder pain and dysuria Pt with history of memory loss, anxiety and depression, HTN, a fib, hypothyroidism, subdural hematoma, and stroke. Pt was treated with biofreeze to left shoulder and neck pt reports this helps but the muscle pain is still ongoing and not improving  Pt also complaining of her urine "buring like fire" this is ongoing and staff reports more frequent complaints-  reports no frequency or urgency. No fevers or chills.   Review of Systems:  Review of Systems  Constitutional: Negative for fever, chills and malaise/fatigue.  HENT: Positive for neck pain (bilaterally to side of cerival spine).   Respiratory: Negative for shortness of breath.   Cardiovascular: Negative for chest pain.  Gastrointestinal: Negative for abdominal pain, diarrhea and constipation.  Genitourinary: Positive for dysuria. Negative for urgency, frequency and hematuria.  Psychiatric/Behavioral: Positive for memory loss.    Medications: Patient's Medications  New Prescriptions   No medications on file  Previous Medications   ACETAMINOPHEN (TYLENOL) 325 MG TABLET    Take 2 tablets (650 mg total) by mouth every 6 (six) hours as needed.   ALPRAZOLAM (XANAX) 0.5 MG TABLET    Take 1 tablet (0.5 mg total) by mouth at bedtime. For anxiety   CETIRIZINE (ZYRTEC) 10 MG TABLET    Take 10 mg by mouth daily.   DILTIAZEM (CARDIZEM CD) 120 MG 24 HR CAPSULE    Take 120 mg by mouth daily.   DONEPEZIL (ARICEPT) 5 MG TABLET    Take 10 mg by mouth at bedtime as needed.    LEVOTHYROXINE (SYNTHROID, LEVOTHROID) 25 MCG TABLET    Take 1 tablet (25 mcg total) by mouth daily before breakfast.   METOPROLOL (TOPROL-XL) 100 MG 24 HR TABLET     Take 100 mg by mouth every morning.     MIRTAZAPINE (REMERON) 15 MG TABLET    Take 15 mg by mouth at bedtime.   MULTIPLE VITAMIN (MULITIVITAMIN WITH MINERALS) TABS    Take 1 tablet by mouth every morning.   TRIAMTERENE-HYDROCHLOROTHIAZIDE (MAXZIDE-25) 37.5-25 MG PER TABLET    Take 1 tablet by mouth daily.  Modified Medications   No medications on file  Discontinued Medications   NORTRIPTYLINE (PAMELOR) 50 MG CAPSULE    Take 50 mg by mouth at bedtime.   SERTRALINE (ZOLOFT) 100 MG TABLET    Take 100 mg by mouth daily.     Physical Exam:  Filed Vitals:   08/31/12 0952  BP: 126/80  Pulse: 64  Temp: 97.8 F (36.6 C)  Resp: 18  Weight: 116 lb (52.617 kg)    Physical Exam  Constitutional: No distress.  Thin female in NAD  Neck: Normal range of motion. Neck supple. No tracheal deviation present. No thyromegaly present.  Cardiovascular: Normal rate and regular rhythm.   Pulmonary/Chest: Effort normal and breath sounds normal.  Abdominal: Soft. Bowel sounds are normal. She exhibits no distension. There is no tenderness.  Musculoskeletal:  Paraspinal muscle tenderness bilaterally; neck with full ROM but slow and cautious   Lymphadenopathy:    She has no cervical adenopathy.  Skin: Skin is warm and dry. She is not diaphoretic.     Assessment/Plan Dysuria-   encouaged  to increase hydration- staff to offer 240 cc beverage of choice q 6 hours while  awake   Will get UA c&S Neck Pain-  Will have therapy evaluate and treat  Will cont biofreeze as needed  Aleve 220 mg q 8 hours for 1 week  Labs/tests ordered ua C&S

## 2012-09-13 ENCOUNTER — Non-Acute Institutional Stay (SKILLED_NURSING_FACILITY): Payer: Medicare Other | Admitting: Nurse Practitioner

## 2012-09-13 DIAGNOSIS — R4182 Altered mental status, unspecified: Secondary | ICD-10-CM

## 2012-09-13 NOTE — Progress Notes (Signed)
Patient ID: Ebony [name], female   DOB: Nov 17, 1931, 79 y.o.   MRN: [id number]  Nursing Home Location:  [facility]   Place of Service: SNF (31)  Chief Complaint  Patient presents with  . Acute Visit    HPI:  79 year old female with a pmh of CVA and TIAs was asked to be seen today by nursing due to a change in mental status. Pt became very inattentive and slow to respond last night. Nursing called daughter who reported she was probably having another TIA and did not want her sent out the hospital for evaluation. On today's visit Ebony [name] is affect is very flat. She is very slow to respond but does answer questions and follows commands. See ROS and PE  On last nights evaluation labs were done and chest xray preformed this showed possible bronchitis.  Review of Systems:  ROS by pt and nursing  Review of Systems  Constitutional: Positive for malaise/fatigue. Negative for fever.  Eyes: Negative for blurred vision, pain and redness.  Respiratory: Negative for cough and shortness of breath.   Cardiovascular: Negative for chest pain, palpitations and leg swelling.  Gastrointestinal: Negative for abdominal pain, diarrhea and constipation.  Genitourinary: Negative for dysuria, urgency and frequency.  Musculoskeletal: Negative for myalgias, joint pain and falls.  Skin: Negative for itching and rash.  Neurological: Positive for weakness. Negative for dizziness, loss of consciousness and headaches.     Medications: Patient's Medications  New Prescriptions   No medications on file  Previous Medications   ACETAMINOPHEN (TYLENOL) 325 MG TABLET    Take 2 tablets (650 mg total) by mouth every 6 (six) hours as needed.   ALPRAZOLAM (XANAX) 0.5 MG TABLET    Take 1 tablet (0.5 mg total) by mouth at bedtime. For anxiety   CETIRIZINE (ZYRTEC) 10 MG TABLET    Take 10 mg by mouth daily.   DILTIAZEM (CARDIZEM CD) 120 MG 24 HR CAPSULE    Take 120 mg by mouth daily.   DONEPEZIL  (ARICEPT) 5 MG TABLET    Take 10 mg by mouth at bedtime as needed.    LEVOTHYROXINE (SYNTHROID, LEVOTHROID) 25 MCG TABLET    Take 1 tablet (25 mcg total) by mouth daily before breakfast.   METOPROLOL (TOPROL-XL) 100 MG 24 HR TABLET    Take 100 mg by mouth every morning.     MIRTAZAPINE (REMERON) 15 MG TABLET    Take 15 mg by mouth at bedtime.   MULTIPLE VITAMIN (MULITIVITAMIN WITH MINERALS) TABS    Take 1 tablet by mouth every morning.   TRIAMTERENE-HYDROCHLOROTHIAZIDE (MAXZIDE-25) 37.5-25 MG PER TABLET    Take 1 tablet by mouth daily.  Modified Medications   No medications on file  Discontinued Medications   No medications on file     Physical Exam:  Filed Vitals:   09/13/12 1616  BP: 120/66  Pulse: 72  Temp: 97 F (36.1 C)  Resp: 18  SpO2: 97%    Physical Exam  Constitutional: She is well-developed, well-nourished, and in no distress. No distress.  HENT:  Head: Normocephalic and atraumatic.  Right Ear: External ear normal.  Left Ear: External ear normal.  Nose: Nose normal.  Mouth/Throat: Oropharynx is clear and moist. No oropharyngeal exudate.  Eyes: Conjunctivae and EOM are normal. Pupils are equal, round, and reactive to light. Right eye exhibits no discharge. Left eye exhibits no discharge.  Neck: Normal range of motion. Neck supple. No thyromegaly present.  Cardiovascular: Normal  rate, regular rhythm and normal heart sounds.   Pulmonary/Chest: Effort normal and breath sounds normal. No respiratory distress. She has no wheezes.  Abdominal: Soft. Bowel sounds are normal. She exhibits no distension. There is no tenderness.  Lymphadenopathy:    She has no cervical adenopathy.  Neurological: She displays weakness and abnormal speech (slowed speech).  Pt with generalized weakness but bilaterally weakness is equal 4/5 to upper and lower extremities   Skin: She is not diaphoretic.  Psychiatric: She has a flat affect.      Labs reviewed/Significant Diagnostic  Results: CBC with Diff       Result: 09/12/2012 9:17 PM    ( Status: F )            WBC  11.9     H  4.0-10.5  K/uL  SLN       RBC  4.86        3.87-5.11  MIL/uL  SLN       Hemoglobin  13.5        12.0-15.0  g/dL  SLN       Hematocrit  40.1        36.0-46.0  %  SLN       MCV  82.5        78.0-100.0  fL  SLN       MCH  27.8        26.0-34.0  pg  SLN       MCHC  33.7        30.0-36.0  g/dL  SLN       RDW  16.1     H  11.5-15.5  %  SLN       Platelet Count  269        150-400  K/uL  SLN       Granulocyte %  68        43-77  %  SLN       Absolute Gran  8.1     H  1.7-7.7  K/uL  SLN       Lymph %  23        12-46  %  SLN       Absolute Lymph  2.8        0.7-4.0  K/uL  SLN       Mono %  8        3-12  %  SLN       Absolute Mono  1.0        0.1-1.0  K/uL  SLN       Eos %  1        0-5  %  SLN       Absolute Eos  0.1        0.0-0.7  K/uL  SLN       Baso %  0        0-1  %  SLN       Absolute Baso  0.0        0.0-0.1  K/uL  SLN       Smear Review  Criteria for review not met   SLN      Basic Metabolic Panel       Result: 09/12/2012 9:53 PM    ( Status: F )            Sodium  137        135-145  mEq/L  SLN  Potassium  4.2        3.5-5.3  mEq/L  SLN       Chloride  100        96-112  mEq/L  SLN       CO2  25        19-32  mEq/L  SLN       Glucose  102     H  70-99  mg/dL  SLN       BUN  19        6-23  mg/dL  SLN       Creatinine  1.25     H  0.50-1.10  mg/dL  SLN       Calcium  9.7     Assessment/Plan AMS-  Possible TIA; will rule out UTI with UA C&S- xray shows possible bronchitis but pt without respiratory symtoms- will not treat this at this time; neurologically pt appears stable; daughter does not want pt sent out for further evaluation due to past episodes; will follow- up BMP and CBC in am also get neuro checks every 8 hours for 72 hours and to notify if worsening symptoms occur.

## 2012-09-15 ENCOUNTER — Non-Acute Institutional Stay (SKILLED_NURSING_FACILITY): Payer: Medicare Other | Admitting: Nurse Practitioner

## 2012-09-15 ENCOUNTER — Encounter: Payer: Self-pay | Admitting: Nurse Practitioner

## 2012-09-15 DIAGNOSIS — F0390 Unspecified dementia without behavioral disturbance: Secondary | ICD-10-CM

## 2012-09-15 DIAGNOSIS — I6350 Cerebral infarction due to unspecified occlusion or stenosis of unspecified cerebral artery: Secondary | ICD-10-CM

## 2012-09-15 DIAGNOSIS — R634 Abnormal weight loss: Secondary | ICD-10-CM

## 2012-09-15 DIAGNOSIS — I639 Cerebral infarction, unspecified: Secondary | ICD-10-CM

## 2012-09-15 DIAGNOSIS — I10 Essential (primary) hypertension: Secondary | ICD-10-CM

## 2012-09-15 NOTE — Progress Notes (Signed)
Patient ID: Ebony [name], female   DOB: [birth year], 79 y.o.   MRN: [id number]  Nursing Home Location:  [facility]   Place of Service: SNF (31)  Chief Complaint  Patient presents with  . Medical Managment of Chronic Issues    HPI:  79 year old female seen today at [facility] for routine visit. Pt with history of memory loss, anxiety and depression, HTN, a fib, hypothyroidism, subdural hematoma, and stroke. Pt has been seen in the past month due to pain with urination, shoulder discomfort and AMS On today's visit pt's AMS has totally resolved and she is back to baseline- staff reported the change in conditions was very transient and she has been doing fine since.  Pt reports she has not had any recent pain with urination  Pt currently on remeron due to weight loss- reports decreased appetite from staff- weights have been stable Review of Systems:  DATA OBTAINED: from patient, nurse, medical record GENERAL: Feels well no fevers, fatigue, appetite still minimal  SKIN: No itching, rash or wounds MOUTH/THROAT: No mouth or tooth pain, No sore throat, No difficulty chewing or swallowing  RESPIRATORY: No cough, wheezing, SOB CARDIAC: No chest pain, palpitations, lower extremity edema  GI: No abdominal pain, No N/V/D or constipation, No heartburn or reflux  GU: No dysuria, frequency or urgency  MUSCULOSKELETAL: No unrelieved bone/joint pain NEUROLOGIC: Awake, alert, appropriate to situation, No change in mental status. Moves all four, no focal deficits PSYCHIATRIC: No overt anxiety or sadness. Sleeps well. No behavior issue.  AMBULATION:  Uses wheelchair Medications: Patient's Medications  New Prescriptions   No medications on file  Previous Medications   ACETAMINOPHEN (TYLENOL) 325 MG TABLET    Take 2 tablets (650 mg total) by mouth every 6 (six) hours as needed.   ALPRAZOLAM (XANAX) 0.5 MG TABLET    Take 1 tablet (0.5 mg total) by mouth at bedtime. For anxiety   CETIRIZINE (ZYRTEC) 10 MG TABLET    Take 10 mg by mouth daily.   DILTIAZEM (CARDIZEM CD) 120 MG 24 HR CAPSULE    Take 120 mg by mouth daily.   DONEPEZIL (ARICEPT) 5 MG TABLET    Take 10 mg by mouth at bedtime as needed.    LEVOTHYROXINE (SYNTHROID, LEVOTHROID) 25 MCG TABLET    Take 1 tablet (25 mcg total) by mouth daily before breakfast.   METOPROLOL (TOPROL-XL) 100 MG 24 HR TABLET    Take 100 mg by mouth every morning.     MIRTAZAPINE (REMERON) 15 MG TABLET    Take 15 mg by mouth at bedtime.   MULTIPLE VITAMIN (MULITIVITAMIN WITH MINERALS) TABS    Take 1 tablet by mouth every morning.   TRIAMTERENE-HYDROCHLOROTHIAZIDE (MAXZIDE-25) 37.5-25 MG PER TABLET    Take 1 tablet by mouth daily.  Modified Medications   No medications on file  Discontinued Medications   No medications on file     Physical Exam:  Filed Vitals:   09/15/12 1437  BP: 130/70  Pulse: 60  Temp: 98.4 F (36.9 C)  Resp: 18    GENERAL APPEARANCE: Alert, conversant. Appropriately groomed. No acute distress.  SKIN: No diaphoresis rash, or wounds HEAD: Normocephalic, atraumatic  EYES: Conjunctiva/lids clear. Pupils round, reactive. EOMs intact.  EARS: External exam WNL Hearing grossly normal.  NOSE: No deformity or discharge.  MOUTH/THROAT: Lips w/o lesions. Mouth and throat normal. Tongue moist, w/o lesion.  NECK: No thyroid tenderness, enlargement or nodule  RESPIRATORY: Breathing is even, unlabored. Lung  sounds are clear   CARDIOVASCULAR: Heart RRR no murmurs, rubs or gallops. No peripheral edema.  ARTERIAL: radial pulse 2+, DP pulse 1+  GASTROINTESTINAL: Abdomen is soft, non-tender, not distended w/ normal bowel sounds. GENITOURINARY: Bladder non tender, not distended  MUSCULOSKELETAL: No abnormal joints or musculature NEUROLOGIC: Oriented X3. Cranial nerves 2-12 grossly intact. Moves all extremities no tremor. PSYCHIATRIC: Mood and affect appropriate to situation, no behavioral issues   Labs  reviewed/Significant Diagnostic Results: CBC with Diff       Result: 09/14/2012 9:12 AM    ( Status: F )       C     WBC  9.6        4.0-10.5  K/uL  SLN       RBC  4.41        3.87-5.11  MIL/uL  SLN       Hemoglobin  12.3        12.0-15.0  g/dL  SLN       Hematocrit  36.7        36.0-46.0  %  SLN       MCV  83.2        78.0-100.0  fL  SLN       MCH  27.9        26.0-34.0  pg  SLN       MCHC  33.5        30.0-36.0  g/dL  SLN       RDW  16.1     H  11.5-15.5  %  SLN       Platelet Count  249        150-400  K/uL  SLN       Granulocyte %  65        43-77  %  SLN       Absolute Gran  6.3        1.7-7.7  K/uL  SLN       Lymph %  26        12-46  %  SLN       Absolute Lymph  2.5        0.7-4.0  K/uL  SLN       Mono %  8        3-12  %  SLN       Absolute Mono  0.7        0.1-1.0  K/uL  SLN       Eos %  1        0-5  %  SLN       Absolute Eos  0.1        0.0-0.7  K/uL  SLN       Baso %  0        0-1  %  SLN       Absolute Baso  0.0        0.0-0.1  K/uL  SLN       Smear Review  Criteria for review not met   SLN      Basic Metabolic Panel       Result: 09/14/2012 11:42 AM    ( Status: F )            Sodium  139        135-145  mEq/L  SLN       Potassium  3.7        3.5-5.3  mEq/L  SLN       Chloride  102        96-112  mEq/L  SLN       CO2  22        19-32  mEq/L  SLN       Glucose  106     H  70-99  mg/dL  SLN       BUN  26     H  6-23  mg/dL  SLN       Creatinine  0.62        0.50-1.10  mg/dL  SLN       Calcium  9.3        8.4-10.5  mg/dL  SLN        Assessment/Plan 1. Dementia, without behavioral disturbance Pt currently on aricept 10 mg; will start namanda XR titration pack at this time   2. HTN (hypertension) Stable at this time.  3. Loss of weight Currently on remeron; not gaining weight but weights are stable at this time; will cont to monitor   4. Acute ischemic stroke, history of Pt with history of CVA but after fall suffered from subarachnoid hemorrhage therefore not on  anticoagulation; will start Ms Talerico on ASA 81 mg daily at this time.

## 2012-10-13 ENCOUNTER — Non-Acute Institutional Stay (SKILLED_NURSING_FACILITY): Payer: Medicare Other | Admitting: Nurse Practitioner

## 2012-10-13 DIAGNOSIS — I10 Essential (primary) hypertension: Secondary | ICD-10-CM

## 2012-10-13 DIAGNOSIS — G309 Alzheimer's disease, unspecified: Secondary | ICD-10-CM

## 2012-10-13 DIAGNOSIS — M549 Dorsalgia, unspecified: Secondary | ICD-10-CM

## 2012-10-13 DIAGNOSIS — F0280 Dementia in other diseases classified elsewhere without behavioral disturbance: Secondary | ICD-10-CM

## 2012-10-13 NOTE — Progress Notes (Signed)
Patient ID: Ebony [name], female   DOB: [birth year], 79 y.o.   MRN: [id number]  Nursing Home Location:  [facility]   Place of Service: SNF (31)  Chief Complaint  Patient presents with  . Medical Managment of Chronic Issues    HPI:  79 year old female  with history of memory loss, anxiety and depression, HTN, a fib, hypothyroidism, subdural hematoma, and stroke who is being seen today at [facility] for routine visit.  Pt reports ongoing occasional lower back pain that is a 6/10 and does not seem to be bothering her today but she does have problems with at times.  Reassessment of ongoing issues: Dementia- pt with ongoing memory loss; taking namenda and aricept Weight loss- pt still cont to eat minimally and reports decrease appetite; pt on Remeron 15 mg qhs-- weights remain stable HTN- stable on current medications, tolerating medications without side effects  Review of Systems:   DATA OBTAINED: from patient, nurse, medical record GENERAL no fevers, fatigue, appetite changes SKIN: No itching, rash or wounds EYES: No eye pain, redness, discharge EARS: No earache, tinnitus, change in hearing NOSE: No congestion, drainage or bleeding  MOUTH/THROAT: No mouth or tooth pain, No sore throat, No difficulty chewing or swallowing  RESPIRATORY: No cough, wheezing, SOB CARDIAC: No chest pain, palpitations, lower extremity edema  GI: No abdominal pain, No N/V/D or constipation, No heartburn or reflux  GU: No dysuria, frequency or urgency, or incontinence  MUSCULOSKELETAL:back and knee pain NEUROLOGIC: Awake, alert, appropriate to situation, No change in mental status. Moves all four, no focal deficits PSYCHIATRIC: No overt anxiety or sadness. Sleeps well. No behavior issue.  AMBULATION:  With WC and walker   Medications: Patient's Medications  New Prescriptions   No medications on file  Previous Medications   ACETAMINOPHEN (TYLENOL) 325 MG TABLET    Take 2 tablets (650 mg  total) by mouth every 6 (six) hours as needed.   ALPRAZOLAM (XANAX) 0.5 MG TABLET    Take 1 tablet (0.5 mg total) by mouth at bedtime. For anxiety   CETIRIZINE (ZYRTEC) 10 MG TABLET    Take 10 mg by mouth daily.   DILTIAZEM (CARDIZEM CD) 120 MG 24 HR CAPSULE    Take 120 mg by mouth daily.   DONEPEZIL (ARICEPT) 5 MG TABLET    Take 10 mg by mouth at bedtime as needed.    LEVOTHYROXINE (SYNTHROID, LEVOTHROID) 25 MCG TABLET    Take 1 tablet (25 mcg total) by mouth daily before breakfast.   MEMANTINE (NAMENDA) 10 MG TABLET    Take 10 mg by mouth 2 (two) times daily.   METOPROLOL (TOPROL-XL) 100 MG 24 HR TABLET    Take 100 mg by mouth every morning.     MIRTAZAPINE (REMERON) 15 MG TABLET    Take 15 mg by mouth at bedtime.   MULTIPLE VITAMIN (MULITIVITAMIN WITH MINERALS) TABS    Take 1 tablet by mouth every morning.   NAPROXEN SODIUM (ANAPROX) 220 MG TABLET    Take 220 mg by mouth 3 (three) times daily as needed.   TRIAMTERENE-HYDROCHLOROTHIAZIDE (MAXZIDE-25) 37.5-25 MG PER TABLET    Take 1 tablet by mouth daily.  Modified Medications   No medications on file  Discontinued Medications   No medications on file     Physical Exam:  Filed Vitals:   10/13/12 1619  BP: 111/72  Pulse: 60  Temp: 97 F (36.1 C)  Resp: 20  Weight: 116 lb (52.617 kg)  GENERAL APPEARANCE: Alert, conversant. Appropriately groomed. No acute distress.  SKIN: No diaphoresis rash, or wounds HEAD: Normocephalic, atraumatic  EYES: Conjunctiva/lids clear. Pupils round, reactive. EOMs intact.  EARS: External exam WNL, canals clear. Hearing grossly normal.  NOSE: No deformity or discharge.  MOUTH/THROAT: Lips w/o lesions. Mouth and throat normal. Tongue moist, w/o lesion.  NECK: No thyroid tenderness, enlargement or nodule  RESPIRATORY: Breathing is even, unlabored. Lung sounds are clear   CARDIOVASCULAR: Heart RRR no murmurs, rubs or gallops. No peripheral edema.  ARTERIAL: radial pulse 2+, DP pulse 1+  VENOUS: No  varicosities. No venous stasis skin changes  GASTROINTESTINAL: Abdomen is soft, non-tender, not distended w/ normal bowel sounds. GENITOURINARY: Bladder non tender, not distended  MUSCULOSKELETAL: No abnormal joints or musculature NEUROLOGIC: Oriented x2 . Cranial nerves 2-12 grossly intact. Moves all extremities no tremor. PSYCHIATRIC: Mood and affect appropriate to situation, no behavioral issues  Labs reviewed/Significant Diagnostic Results: CBC with Diff       Result: 09/14/2012 9:12 AM    ( Status: F )       C     WBC  9.6        4.0-10.5  K/uL  SLN       RBC  4.41        3.87-5.11  MIL/uL  SLN       Hemoglobin  12.3        12.0-15.0  g/dL  SLN       Hematocrit  36.7        36.0-46.0  %  SLN       MCV  83.2        78.0-100.0  fL  SLN       MCH  27.9        26.0-34.0  pg  SLN       MCHC  33.5        30.0-36.0  g/dL  SLN       RDW  96.0     H  11.5-15.5  %  SLN       Platelet Count  249        150-400  K/uL  SLN       Granulocyte %  65        43-77  %  SLN       Absolute Gran  6.3        1.7-7.7  K/uL  SLN       Lymph %  26        12-46  %  SLN       Absolute Lymph  2.5        0.7-4.0  K/uL  SLN       Mono %  8        3-12  %  SLN       Absolute Mono  0.7        0.1-1.0  K/uL  SLN       Eos %  1        0-5  %  SLN       Absolute Eos  0.1        0.0-0.7  K/uL  SLN       Baso %  0        0-1  %  SLN       Absolute Baso  0.0        0.0-0.1  K/uL  SLN  Smear Review  Criteria for review not met   SLN      Basic Metabolic Panel       Result: 09/14/2012 11:42 AM    ( Status: F )            Sodium  139        135-145  mEq/L  SLN       Potassium  3.7        3.5-5.3  mEq/L  SLN       Chloride  102        96-112  mEq/L  SLN       CO2  22        19-32  mEq/L  SLN       Glucose  106     H  70-99  mg/dL  SLN       BUN  26     H  6-23  mg/dL  SLN       Creatinine  0.62        0.50-1.10  mg/dL  SLN       Calcium  9.3         Assessment/Plan  1. HTN (hypertension) Patients BP and HR  is stable; continue current regimen. Will monitor and make changes as necessary.  2. Dementia Progressive. Pt doing well in current setting. On namenda and aricept and tolerating well without noted side effects.   3. Back pain Normal exam; pt reports pain comes on mostly with movement however did not have any on exam today. Will have therapy evaluate and treat for back pain. Pt to use PRNS if needed as well

## 2012-11-08 ENCOUNTER — Non-Acute Institutional Stay (SKILLED_NURSING_FACILITY): Payer: Medicare Other | Admitting: Nurse Practitioner

## 2012-11-08 DIAGNOSIS — R634 Abnormal weight loss: Secondary | ICD-10-CM

## 2012-11-08 DIAGNOSIS — F329 Major depressive disorder, single episode, unspecified: Secondary | ICD-10-CM

## 2012-11-08 DIAGNOSIS — E039 Hypothyroidism, unspecified: Secondary | ICD-10-CM

## 2012-11-08 DIAGNOSIS — F32A Depression, unspecified: Secondary | ICD-10-CM

## 2012-11-08 DIAGNOSIS — M25532 Pain in left wrist: Secondary | ICD-10-CM

## 2012-11-08 DIAGNOSIS — M25539 Pain in unspecified wrist: Secondary | ICD-10-CM

## 2012-11-08 DIAGNOSIS — I10 Essential (primary) hypertension: Secondary | ICD-10-CM

## 2012-11-08 NOTE — Progress Notes (Signed)
Patient ID: Ebony [name], female   DOB: 07-11-1931, 79 y.o.   MRN: [id number]  Nursing Home Location:  [facility]   Place of Service: SNF (31)  Chief Complaint  Patient presents with  . Medical Managment of Chronic Issues    HPI:  79 year old female with history of memory loss, anxiety and depression, HTN, a fib, hypothyroidism, subdural hematoma, and stroke who is being seen today at [facility] for routine visit. Staff reported a swollen and painful left wrist earlier this week; this has now resolved  Reassessment of ongoing issues:  Dementia- pt with ongoing memory loss; taking namenda and aricept  Weight loss- pt still cont to eat minimally and reports decrease appetite; pt on Remeron 15 mg qhs-- weights have slightly increase HTN- stable on current medications, tolerating medications without side effects Anxiety and depression- pt reports this has worsen, easily agitated   Review of Systems:    DATA OBTAINED: from patient, nurse, medical record GENERAL: no fevers, fatigue, appetite changes SKIN: No itching, rash or wounds MOUTH/THROAT: No mouth or tooth pain, No sore throat, No difficulty chewing or swallowing  RESPIRATORY: No cough, wheezing, SOB CARDIAC: No chest pain, palpitations, lower extremity edema  GI: No abdominal pain, No N/V/D or constipation, No heartburn or reflux  GU: No dysuria, frequency or urgency, or incontinence  MUSCULOSKELETAL: No unrelieved bone/joint pain- however has freq complaints of pain in legs, wrist and back NEUROLOGIC: Awake, alert, appropriate to situation, No change in mental status. Moves all four, no focal deficits PSYCHIATRIC: has depression. Sleeps well. No behavior issue.  AMBULATION:  Walks with walker and WC   Medications: Patient's Medications  New Prescriptions   No medications on file  Previous Medications   ACETAMINOPHEN (TYLENOL) 325 MG TABLET    Take 2 tablets (650 mg total) by mouth every 6 (six) hours as  needed.   ALPRAZOLAM (XANAX) 0.5 MG TABLET    Take 1 tablet (0.5 mg total) by mouth at bedtime. For anxiety   CETIRIZINE (ZYRTEC) 10 MG TABLET    Take 10 mg by mouth daily.   DILTIAZEM (CARDIZEM CD) 120 MG 24 HR CAPSULE    Take 120 mg by mouth daily.   DONEPEZIL (ARICEPT) 5 MG TABLET    Take 10 mg by mouth at bedtime as needed.    LEVOTHYROXINE (SYNTHROID, LEVOTHROID) 25 MCG TABLET    Take 1 tablet (25 mcg total) by mouth daily before breakfast.   MEMANTINE (NAMENDA) 10 MG TABLET    Take 10 mg by mouth 2 (two) times daily.   METOPROLOL (TOPROL-XL) 100 MG 24 HR TABLET    Take 100 mg by mouth every morning.     MIRTAZAPINE (REMERON) 15 MG TABLET    Take 15 mg by mouth at bedtime.   MULTIPLE VITAMIN (MULITIVITAMIN WITH MINERALS) TABS    Take 1 tablet by mouth every morning.   NAPROXEN SODIUM (ANAPROX) 220 MG TABLET    Take 220 mg by mouth 3 (three) times daily as needed.   TRIAMTERENE-HYDROCHLOROTHIAZIDE (MAXZIDE-25) 37.5-25 MG PER TABLET    Take 1 tablet by mouth daily.  Modified Medications   No medications on file  Discontinued Medications   No medications on file     Physical Exam:  Filed Vitals:   11/08/12 1545  BP: 132/74  Pulse: 70  Temp: 98 F (36.7 C)  Resp: 20  Weight: 118 lb 9.6 oz (53.797 kg)   GENERAL APPEARANCE: Alert, conversant. Appropriately groomed. No  acute distress.  SKIN: No diaphoresis rash, or wounds HEAD: Normocephalic, atraumatic  EYES: Conjunctiva/lids clear. Pupils round, reactive. EOMs intact.  EARS: External exam WNL, canals clear. Hearing grossly normal.  NOSE: No deformity or discharge.  MOUTH/THROAT: Lips w/o lesions. Mouth and throat normal. Tongue moist, w/o lesion.  NECK: No thyroid tenderness, enlargement or nodule  RESPIRATORY: Breathing is even, unlabored. Lung sounds are clear   CARDIOVASCULAR: Heart RRR no murmurs, rubs or gallops. No peripheral edema.  ARTERIAL: radial pulse 2+, DP pulse 1+  VENOUS: No varicosities. No venous stasis skin  changes  GASTROINTESTINAL: Abdomen is soft, non-tender, not distended w/ normal bowel sounds. GENITOURINARY: Bladder non tender, not distended  MUSCULOSKELETAL: No abnormal joints or musculature NEUROLOGIC: Oriented X2 Cranial nerves 2-12 grossly intact. Moves all extremities no tremor. PSYCHIATRIC: Mood and affect appropriate to situation, no behavioral issues  Labs reviewed/Significant Diagnostic Results:  Basic Metabolic Panel       Result: 11/08/2012 4:06 PM    ( Status: F )            Sodium  140        135-145  mEq/L  SLN       Potassium  3.7        3.5-5.3  mEq/L  SLN       Chloride  105        96-112  mEq/L  SLN       CO2  28        19-32  mEq/L  SLN       Glucose  86        70-99  mg/dL  SLN       BUN  16        6-23  mg/dL  SLN       Creatinine  0.66        0.50-1.10  mg/dL  SLN       Calcium  9.7        8.4-10.5  mg/dL  SLN      TSH, Ultrasensitive       Result: 11/08/2012 5:22 PM    ( Status: F )            TSH  2.025        0.350-4.500  uIU/mL  SLN           Assessment/Plan 1. Unspecified hypothyroidism Patients thyroid is stable; continue current regimen. Will monitor and make changes as necessary.  2. HTN (hypertension) Blood pressure is stabe; will cont current medications   3. Loss of weight Improved however pt reports she does not eat much and still has decreased appetite.   4. Left wrist pain Improved- to use PRNs  5. Depression  With anxiety. Reports this has worsened will start cymbalta 30 mg daily due to frequent pain and with depression and anxiety at this time and cont to monitor mood

## 2012-12-06 ENCOUNTER — Non-Acute Institutional Stay (SKILLED_NURSING_FACILITY): Payer: Medicare Other | Admitting: Nurse Practitioner

## 2012-12-06 ENCOUNTER — Encounter: Payer: Self-pay | Admitting: Nurse Practitioner

## 2012-12-06 DIAGNOSIS — R634 Abnormal weight loss: Secondary | ICD-10-CM

## 2012-12-06 DIAGNOSIS — I10 Essential (primary) hypertension: Secondary | ICD-10-CM

## 2012-12-06 DIAGNOSIS — F329 Major depressive disorder, single episode, unspecified: Secondary | ICD-10-CM

## 2012-12-06 DIAGNOSIS — F32A Depression, unspecified: Secondary | ICD-10-CM

## 2012-12-06 DIAGNOSIS — R350 Frequency of micturition: Secondary | ICD-10-CM

## 2012-12-06 NOTE — Progress Notes (Signed)
Patient ID: Ebony [name], female   DOB: Jul 15, 1931, 79 y.o.   MRN: [id number] .    Allergies  Allergen Reactions  . Penicillins Diarrhea  . Ace Inhibitors   . Biaxin [Clarithromycin] Diarrhea  . Sulfa Antibiotics Diarrhea  . Cozaar [Losartan Potassium] Rash    Chief Complaint  Patient presents with  . Medical Managment of Chronic Issues    HPI:  79 year old female with history of memory loss, anxiety and depression, HTN, a fib, hypothyroidism, subdural hematoma, and stroke who is being seen today at [facility] for routine visit.  Pt has ongoing urinary discomfort; however she reports this is not a pain or burning; was placed on pyridium a few weeks ago and this did not help symptoms. Reports this is more like a pressure at time. No fever or chill. Denies incontinence episodes  Reassessment of ongoing issues:  Dementia- pt with ongoing memory loss; taking namenda and aricept  Weight loss- pt still cont to eat minimally and reports decrease appetite; pt on Remeron 15 mg qhs-- weights have slightly increase; pt reports she has gained 3 lbs in the last month however this is not documented.  HTN- stable on current medications, tolerating medications without side effects  Anxiety and depression-stable; no adverse effects from cymbalta  Review of Systems:  Review of Systems  Constitutional: Negative for fever and chills.  Respiratory: Negative for shortness of breath.   Cardiovascular: Negative for chest pain and leg swelling.  Gastrointestinal: Negative for heartburn, nausea, vomiting, abdominal pain, diarrhea and constipation.  Genitourinary: Positive for frequency. Negative for dysuria and urgency.  Musculoskeletal: Positive for back pain. Negative for myalgias.  Skin: Negative.   Neurological: Negative for dizziness, weakness and headaches.  Psychiatric/Behavioral: Negative for depression and memory loss. The patient is not nervous/anxious.      Past Medical History   Diagnosis Date  . Hypertension   . Subarachnoid hemorrhage following injury 05/16/2011  . Unspecified hypothyroidism 04/15/2012  . SDH (subdural hematoma) 05/16/2011  . Hiatal hernia   . Allergic rhinitis   . Fibrocystic breast disease   . Anxiety   . IFG (impaired fasting glucose)   . Arthritis     Knees  . Hyperlipidemia    Past Surgical History  Procedure Laterality Date  . Abdominal hysterectomy    . Breast surgery    . Tonsillectomy    . Eye surgery     Social History:   reports that she has never smoked. She does not have any smokeless tobacco history on file. She reports that she does not drink alcohol or use illicit drugs.  Family History  Problem Relation Age of Onset  . CVA Neg Hx   . Diabetes Neg Hx   . Hypertension Neg Hx   . Lung cancer Sister     Medications: Patient's Medications  New Prescriptions   No medications on file  Previous Medications   ACETAMINOPHEN (TYLENOL) 325 MG TABLET    Take 2 tablets (650 mg total) by mouth every 6 (six) hours as needed.   ALPRAZOLAM (XANAX) 0.5 MG TABLET    Take 1 tablet (0.5 mg total) by mouth at bedtime. For anxiety   CETIRIZINE (ZYRTEC) 10 MG TABLET    Take 10 mg by mouth daily.   DILTIAZEM (CARDIZEM CD) 120 MG 24 HR CAPSULE    Take 120 mg by mouth daily.   DONEPEZIL (ARICEPT) 5 MG TABLET    Take 10 mg by mouth at bedtime as needed.  LEVOTHYROXINE (SYNTHROID, LEVOTHROID) 25 MCG TABLET    Take 1 tablet (25 mcg total) by mouth daily before breakfast.   MEMANTINE (NAMENDA) 10 MG TABLET    Take 10 mg by mouth 2 (two) times daily.   METOPROLOL (TOPROL-XL) 100 MG 24 HR TABLET    Take 100 mg by mouth every morning.     MIRTAZAPINE (REMERON) 15 MG TABLET    Take 15 mg by mouth at bedtime.   MULTIPLE VITAMIN (MULITIVITAMIN WITH MINERALS) TABS    Take 1 tablet by mouth every morning.   NAPROXEN SODIUM (ANAPROX) 220 MG TABLET    Take 220 mg by mouth 3 (three) times daily as needed.   TRIAMTERENE-HYDROCHLOROTHIAZIDE  (MAXZIDE-25) 37.5-25 MG PER TABLET    Take 1 tablet by mouth daily.  Modified Medications   No medications on file  Discontinued Medications   No medications on file     Physical Exam: GENERAL APPEARANCE: Alert, conversant. Appropriately groomed. No acute distress.   SKIN: No diaphoresis rash, or wounds HEENT: unremarkable NECK: No thyroid tenderness, enlargement or nodule   RESPIRATORY: Breathing is even, unlabored. Lung sounds are clear    CARDIOVASCULAR: Heart RRR no murmurs, rubs or gallops. No peripheral edema.   ARTERIAL: radial pulse 2+, DP pulse 1+   GASTROINTESTINAL: Abdomen is soft, non-tender, not distended w/ normal bowel sounds. GENITOURINARY: Bladder non tender, not distended   MUSCULOSKELETAL: No abnormal joints or musculature NEUROLOGIC: Oriented X2 Cranial nerves 2-12 grossly intact. Moves all extremities no tremor. PSYCHIATRIC: Mood and affect appropriate to situation, no behavioral issues  Filed Vitals:   12/06/12 1418  BP: 116/74  Pulse: 70  Temp: 98 F (36.7 C)  Resp: 18  Weight: 118 lb 9.6 oz (53.797 kg)      Labs reviewed: Basic Metabolic Panel:  Recent Labs  40/98/11 2136  04/13/12 0527 04/14/12 0518 04/15/12 0439  NA 133*  < > 131* 129* 133*  K 2.8*  < > 2.9* 4.0 3.7  CL 92*  < > 97 96 95*  CO2 27  < > 24 25 27   GLUCOSE 118*  < > 90 93 97  BUN 12  < > 11 10 12   CREATININE 0.73  < > 0.58 0.54 0.58  CALCIUM 9.9  < > 8.3* 8.6 9.1  MG 1.4*  --  1.5  --   --   < > = values in this interval not displayed. Liver Function Tests:  Recent Labs  04/09/12 2136 04/10/12 0458  AST 53* 66*  ALT 32 33  ALKPHOS 148* 134*  BILITOT 0.8 0.7  PROT 7.3 6.8  ALBUMIN 4.1 3.6   No results found for this basename: LIPASE, AMYLASE,  in the last 8760 hours No results found for this basename: AMMONIA,  in the last 8760 hours CBC:  Recent Labs  04/09/12 2136 04/10/12 0458 04/12/12 0532 04/13/12 0527  WBC 9.5 11.7* 10.5 8.9  NEUTROABS 7.4  --   --    --   HGB 13.2 12.9 10.7* 9.7*  HCT 38.4 38.2 32.2* 28.3*  MCV 84.0 84.5 87.0 86.3  PLT 225 230 196 187   Cardiac Enzymes:  Recent Labs  04/09/12 2136  CKTOTAL 643*  TROPONINI <0.30   BNP: No components found with this basename: POCBNP,  CBG:  Recent Labs  04/09/12 2125  GLUCAP 109*     Basic Metabolic Panel        Result: 10/31/4780 4:06 PM    ( Status: F )  Sodium  140        135-145  mEq/L  SLN        Potassium  3.7        3.5-5.3  mEq/L  SLN        Chloride  105        96-112  mEq/L  SLN        CO2  28        19-32  mEq/L  SLN        Glucose  86        70-99  mg/dL  SLN        BUN  16        6-23  mg/dL  SLN        Creatinine  0.66        0.50-1.10  mg/dL  SLN        Calcium  9.7        8.4-10.5  mg/dL  SLN       TSH, Ultrasensitive        Result: 11/08/2012 5:22 PM    ( Status: F )             TSH  2.025        0.350-4.500  uIU/mL  SLN              Assessment/Plan 1. HTN (hypertension) Patient is stable; continue current regimen. Will monitor and make changes as necessary.  2. Loss of weight Weight gain noted; do not have weights doc for this month; will cont montior; will cont remeron   3. Depression Patient is stable; continue current regimen. Will monitor and make changes as necessary.  4. Urinary frequency With urinary pressure; pt finds it hard to explain exact symptoms; will start cranberry and see if this helps; if not may need to try low dose vesicare

## 2013-01-06 ENCOUNTER — Non-Acute Institutional Stay (SKILLED_NURSING_FACILITY): Payer: Medicare Other | Admitting: Nurse Practitioner

## 2013-01-06 ENCOUNTER — Encounter: Payer: Self-pay | Admitting: Nurse Practitioner

## 2013-01-06 DIAGNOSIS — R197 Diarrhea, unspecified: Secondary | ICD-10-CM

## 2013-01-06 DIAGNOSIS — F419 Anxiety disorder, unspecified: Secondary | ICD-10-CM

## 2013-01-06 DIAGNOSIS — M1990 Unspecified osteoarthritis, unspecified site: Secondary | ICD-10-CM | POA: Insufficient documentation

## 2013-01-06 DIAGNOSIS — F411 Generalized anxiety disorder: Secondary | ICD-10-CM

## 2013-01-06 DIAGNOSIS — F0390 Unspecified dementia without behavioral disturbance: Secondary | ICD-10-CM

## 2013-01-06 DIAGNOSIS — I10 Essential (primary) hypertension: Secondary | ICD-10-CM

## 2013-01-06 DIAGNOSIS — R634 Abnormal weight loss: Secondary | ICD-10-CM

## 2013-01-06 DIAGNOSIS — R350 Frequency of micturition: Secondary | ICD-10-CM

## 2013-01-06 NOTE — Progress Notes (Signed)
Patient ID: Ebony [name], female   DOB: 1931/12/10, 79 y.o.   MRN: [id number] Nursing Home Location:  [facility]   Place of Service: SNF (31)  PCP: Harlow Asa, MD   Allergies  Allergen Reactions  . Penicillins Diarrhea  . Ace Inhibitors   . Biaxin [Clarithromycin] Diarrhea  . Sulfa Antibiotics Diarrhea  . Cozaar [Losartan Potassium] Rash    Chief Complaint  Patient presents with  . Medical Managment of Chronic Issues    HPI:  79 year old female with history of memory loss, anxiety and depression, HTN, a fib, hypothyroidism, subdural hematoma, and stroke who is being seen today at [facility] for routine visit.  Pt has ongoing urinary complaints; last month she reported frequency but this month she reports occasional pain with urination. Denies urinary frequency, No fever or chill. Denies incontinence episodes  Reassessment of ongoing issues:  Dementia- pt with ongoing memory loss; taking namenda and aricept  Weight loss- pt reports improved appetite; weight gain in the last month; will cont to monitor HTN- stable on current medications, tolerating medications without side effects Anxiety and depression-stable; no adverse effects from cymbalta  Review of Systems:  Review of Systems  Constitutional: Negative for fever and chills.  Respiratory: Negative for cough and shortness of breath.   Cardiovascular: Negative for chest pain and leg swelling.  Gastrointestinal: Negative for heartburn, nausea, vomiting, abdominal pain, diarrhea and constipation.  Genitourinary: Negative for dysuria, urgency, frequency and hematuria.  Musculoskeletal: Positive for back pain. Negative for myalgias.  Skin: Negative.   Neurological: Negative for dizziness, tingling, weakness and headaches.  Psychiatric/Behavioral: Negative for depression and memory loss. The patient is not nervous/anxious.      Past Medical History  Diagnosis Date  . Hypertension   . Subarachnoid  hemorrhage following injury 05/16/2011  . Unspecified hypothyroidism 04/15/2012  . SDH (subdural hematoma) 05/16/2011  . Hiatal hernia   . Allergic rhinitis   . Fibrocystic breast disease   . Anxiety   . IFG (impaired fasting glucose)   . Arthritis     Knees  . Hyperlipidemia    Past Surgical History  Procedure Laterality Date  . Abdominal hysterectomy    . Breast surgery    . Tonsillectomy    . Eye surgery     Social History:   reports that she has never smoked. She does not have any smokeless tobacco history on file. She reports that she does not drink alcohol or use illicit drugs.  Family History  Problem Relation Age of Onset  . CVA Neg Hx   . Diabetes Neg Hx   . Hypertension Neg Hx   . Lung cancer Sister     Medications: Patient's Medications  New Prescriptions   No medications on file  Previous Medications   ACETAMINOPHEN (TYLENOL) 325 MG TABLET    Take 2 tablets (650 mg total) by mouth every 6 (six) hours as needed.   ALPRAZOLAM (XANAX) 0.5 MG TABLET    Take 1 tablet (0.5 mg total) by mouth at bedtime. For anxiety   CETIRIZINE (ZYRTEC) 10 MG TABLET    Take 10 mg by mouth daily.   CRANBERRY 425 MG CAPS    Take by mouth daily.   DILTIAZEM (CARDIZEM CD) 120 MG 24 HR CAPSULE    Take 120 mg by mouth daily.   DONEPEZIL (ARICEPT) 5 MG TABLET    Take 10 mg by mouth at bedtime as needed.    DULOXETINE (CYMBALTA) 30 MG CAPSULE  Take 30 mg by mouth daily.   LEVOTHYROXINE (SYNTHROID, LEVOTHROID) 25 MCG TABLET    Take 1 tablet (25 mcg total) by mouth daily before breakfast.   MEMANTINE (NAMENDA) 10 MG TABLET    Take 10 mg by mouth 2 (two) times daily.   METOPROLOL (TOPROL-XL) 100 MG 24 HR TABLET    Take 100 mg by mouth every morning.     MIRTAZAPINE (REMERON) 15 MG TABLET    Take 15 mg by mouth at bedtime.   MULTIPLE VITAMIN (MULITIVITAMIN WITH MINERALS) TABS    Take 1 tablet by mouth every morning.   NAPROXEN SODIUM (ANAPROX) 220 MG TABLET    Take 220 mg by mouth 3 (three)  times daily as needed.   TRIAMTERENE-HYDROCHLOROTHIAZIDE (MAXZIDE-25) 37.5-25 MG PER TABLET    Take 1 tablet by mouth daily.  Modified Medications   No medications on file  Discontinued Medications   DULOXETINE (CYMBALTA) 20 MG CAPSULE    Take 30 mg by mouth daily.      Physical Exam:  Filed Vitals:   01/06/13 1432  BP: 157/84  Pulse: 60  Temp: 97.4 F (36.3 C)  Resp: 20  Weight: 121 lb (54.885 kg)    GENERAL APPEARANCE: Alert, conversant. Appropriately groomed. No acute distress.  SKIN: No diaphoresis rash, or wounds  HEENT: unremarkable  NECK: No thyroid tenderness, enlargement or nodule  RESPIRATORY: Breathing is even, unlabored. Lung sounds are clear  CARDIOVASCULAR: Heart RRR no murmurs, rubs or gallops. No peripheral edema.  ARTERIAL: radial pulse 2+ GASTROINTESTINAL: Abdomen is soft, non-tender, not distended w/ normal bowel sounds.  GENITOURINARY: Bladder non tender, not distended  MUSCULOSKELETAL: No abnormal joints or musculature  NEUROLOGIC: Oriented X2 Cranial nerves 2-12 grossly intact. Moves all extremities; no tremor.  PSYCHIATRIC: Mood and affect appropriate to situation, no behavioral issues   Labs reviewed: Basic Metabolic Panel:  Recent Labs  16/10/96 2136  04/13/12 0527 04/14/12 0518 04/15/12 0439  NA 133*  < > 131* 129* 133*  K 2.8*  < > 2.9* 4.0 3.7  CL 92*  < > 97 96 95*  CO2 27  < > 24 25 27   GLUCOSE 118*  < > 90 93 97  BUN 12  < > 11 10 12   CREATININE 0.73  < > 0.58 0.54 0.58  CALCIUM 9.9  < > 8.3* 8.6 9.1  MG 1.4*  --  1.5  --   --   < > = values in this interval not displayed. Liver Function Tests:  Recent Labs  04/09/12 2136 04/10/12 0458  AST 53* 66*  ALT 32 33  ALKPHOS 148* 134*  BILITOT 0.8 0.7  PROT 7.3 6.8  ALBUMIN 4.1 3.6   No results found for this basename: LIPASE, AMYLASE,  in the last 8760 hours No results found for this basename: AMMONIA,  in the last 8760 hours CBC:  Recent Labs  04/09/12 2136  04/10/12 0458 04/12/12 0532 04/13/12 0527  WBC 9.5 11.7* 10.5 8.9  NEUTROABS 7.4  --   --   --   HGB 13.2 12.9 10.7* 9.7*  HCT 38.4 38.2 32.2* 28.3*  MCV 84.0 84.5 87.0 86.3  PLT 225 230 196 187   Cardiac Enzymes:  Recent Labs  04/09/12 2136  CKTOTAL 643*  TROPONINI <0.30   BNP: No components found with this basename: POCBNP,  CBG:  Recent Labs  04/09/12 2125  GLUCAP 109*   TSH:  Recent Labs  04/10/12 0139  TSH 4.839*   A1C:  No results found for this basename: HGBA1C   Lipid Panel:  Recent Labs  04/12/12 0535  CHOL 168  HDL 52  LDLCALC 98  TRIG 89  CHOLHDL 3.2    Basic Metabolic Panel  Result: 11/08/2012 4:06 PM ( Status: F )  Sodium 140 135-145 mEq/L SLN  Potassium 3.7 3.5-5.3 mEq/L SLN  Chloride 105 96-112 mEq/L SLN  CO2 28 19-32 mEq/L SLN  Glucose 86 70-99 mg/dL SLN  BUN 16 5-62 mg/dL SLN  Creatinine 1.30 8.65-7.84 mg/dL SLN  Calcium 9.7 6.9-62.9 mg/dL SLN  TSH, Ultrasensitive  Result: 11/08/2012 5:22 PM ( Status: F )  TSH 2.025 0.350-4.500 uIU/mL SLN    Assessment/Plan 1. HTN (hypertension) Elevated today; will cont to monitor and make changes in medications if needed  2. Dementia Stable on aricept and namenda; does well in current living environment   3. Anxiety disorder Stable on cymbalta  4. Diarrhea Occasional; but stable  5. Loss of weight Stable; pt on supplements with some weight gain; will cont to monitor  6. OA (osteoarthritis) Patient is stable; continue current regimen. Will monitor and make changes as necessary.  7. Urinary frequency This has improved however with vague urinary complaints; no signs of infection, encouraged good fluid intake; will cont to monitor

## 2013-01-12 ENCOUNTER — Other Ambulatory Visit: Payer: Self-pay | Admitting: *Deleted

## 2013-01-12 MED ORDER — ALPRAZOLAM 0.5 MG PO TABS: 0.5000 mg | ORAL_TABLET | Freq: Every day | ORAL | Status: DC

## 2013-02-10 ENCOUNTER — Non-Acute Institutional Stay (SKILLED_NURSING_FACILITY): Payer: Medicare Other | Admitting: Nurse Practitioner

## 2013-02-10 DIAGNOSIS — F411 Generalized anxiety disorder: Secondary | ICD-10-CM

## 2013-02-10 DIAGNOSIS — R634 Abnormal weight loss: Secondary | ICD-10-CM

## 2013-02-10 DIAGNOSIS — I10 Essential (primary) hypertension: Secondary | ICD-10-CM

## 2013-02-10 DIAGNOSIS — M545 Low back pain, unspecified: Secondary | ICD-10-CM

## 2013-02-10 DIAGNOSIS — F0390 Unspecified dementia without behavioral disturbance: Secondary | ICD-10-CM

## 2013-02-10 DIAGNOSIS — F419 Anxiety disorder, unspecified: Secondary | ICD-10-CM

## 2013-02-10 NOTE — Progress Notes (Signed)
Patient ID: Ebony [name], female   DOB: 1931-05-28, 79 y.o.   MRN: [id number]    Nursing Home Location:  [facility]   Place of Service: SNF (31)  PCP: Harlow Asa, MD  Allergies  Allergen Reactions  . Penicillins Diarrhea  . Ace Inhibitors   . Biaxin [Clarithromycin] Diarrhea  . Sulfa Antibiotics Diarrhea  . Cozaar [Losartan Potassium] Rash    Chief Complaint  Patient presents with  . Medical Managment of Chronic Issues    HPI:  79 year old female with history of memory loss, anxiety and depression, HTN, a fib, hypothyroidism, subdural hematoma, and stroke who is being seen today at [facility] for routine visit.  Pt went to ortho MD yesterday was placed on prednisone course and Vicodin; has not needed pain medication, does not have any other complaints and nursing has no concerns Reassessment of ongoing issues:  Dementia- pt with ongoing memory loss; taking namenda and aricept  Weight loss- pt reports improved appetite; has had additional weight gain in the last month HTN- stable on current medications, tolerating medications without side effects  Anxiety and depression-stable; no adverse effects from cymbalta  Review of Systems:  Review of Systems  Constitutional: Negative for fever, chills and malaise/fatigue.  Respiratory: Negative for cough and shortness of breath.   Cardiovascular: Negative for chest pain and leg swelling.  Gastrointestinal: Negative for heartburn, abdominal pain, diarrhea and constipation.  Genitourinary: Negative for dysuria, urgency and frequency.  Musculoskeletal: Positive for back pain.  Skin: Negative.   Neurological: Negative for dizziness, weakness and headaches.  Psychiatric/Behavioral: Negative for depression. The patient is not nervous/anxious and does not have insomnia.      Past Medical History  Diagnosis Date  . Hypertension   . Subarachnoid hemorrhage following injury 05/16/2011  . Unspecified hypothyroidism  04/15/2012  . SDH (subdural hematoma) 05/16/2011  . Hiatal hernia   . Allergic rhinitis   . Fibrocystic breast disease   . Anxiety   . IFG (impaired fasting glucose)   . Arthritis     Knees  . Hyperlipidemia    Past Surgical History  Procedure Laterality Date  . Abdominal hysterectomy    . Breast surgery    . Tonsillectomy    . Eye surgery     Social History:   reports that she has never smoked. She does not have any smokeless tobacco history on file. She reports that she does not drink alcohol or use illicit drugs.  Family History  Problem Relation Age of Onset  . CVA Neg Hx   . Diabetes Neg Hx   . Hypertension Neg Hx   . Lung cancer Sister     Medications: Patient's Medications  New Prescriptions   No medications on file  Previous Medications   ACETAMINOPHEN (TYLENOL) 325 MG TABLET    Take 2 tablets (650 mg total) by mouth every 6 (six) hours as needed.   ALPRAZOLAM (XANAX) 0.5 MG TABLET    Take 1 tablet (0.5 mg total) by mouth at bedtime. For anxiety   CETIRIZINE (ZYRTEC) 10 MG TABLET    Take 10 mg by mouth daily.   CRANBERRY 425 MG CAPS    Take by mouth daily.   DILTIAZEM (CARDIZEM CD) 120 MG 24 HR CAPSULE    Take 120 mg by mouth daily.   DONEPEZIL (ARICEPT) 5 MG TABLET    Take 10 mg by mouth at bedtime as needed.    DULOXETINE (CYMBALTA) 30 MG CAPSULE  Take 30 mg by mouth daily.   LEVOTHYROXINE (SYNTHROID, LEVOTHROID) 25 MCG TABLET    Take 1 tablet (25 mcg total) by mouth daily before breakfast.   MEMANTINE (NAMENDA) 10 MG TABLET    Take 10 mg by mouth 2 (two) times daily.   METOPROLOL (TOPROL-XL) 100 MG 24 HR TABLET    Take 100 mg by mouth every morning.     MIRTAZAPINE (REMERON) 15 MG TABLET    Take 15 mg by mouth at bedtime.   MULTIPLE VITAMIN (MULITIVITAMIN WITH MINERALS) TABS    Take 1 tablet by mouth every morning.   NAPROXEN SODIUM (ANAPROX) 220 MG TABLET    Take 220 mg by mouth 3 (three) times daily as needed.   TRIAMTERENE-HYDROCHLOROTHIAZIDE (MAXZIDE-25)  37.5-25 MG PER TABLET    Take 1 tablet by mouth daily.  Modified Medications   No medications on file  Discontinued Medications   No medications on file     Physical Exam: Physical Exam  Constitutional: She is well-developed, well-nourished, and in no distress. No distress.  HENT:  Head: Normocephalic and atraumatic.  Right Ear: External ear normal.  Left Ear: External ear normal.  Nose: Nose normal.  Mouth/Throat: Oropharynx is clear and moist. No oropharyngeal exudate.  Eyes: Conjunctivae and EOM are normal. Pupils are equal, round, and reactive to light. Right eye exhibits no discharge. Left eye exhibits no discharge.  Neck: Normal range of motion. Neck supple. No thyromegaly present.  Cardiovascular: Normal rate, regular rhythm, normal heart sounds and intact distal pulses.   Pulmonary/Chest: Effort normal and breath sounds normal. No respiratory distress. She has no wheezes.  Abdominal: Soft. Bowel sounds are normal. She exhibits no distension. There is no tenderness.  Musculoskeletal: Normal range of motion. She exhibits tenderness (lumbar spine). She exhibits no edema.  Lymphadenopathy:    She has no cervical adenopathy.  Neurological: She is alert. She has normal strength.  Skin: Skin is warm and dry. She is not diaphoretic. No erythema.    Filed Vitals:   02/10/13 1236  BP: 122/70  Pulse: 70  Temp: 98.5 F (36.9 C)  Resp: 18  Weight: 126 lb (57.153 kg)      Labs reviewed: Basic Metabolic Panel:  Recent Labs  32/44/01 2136  04/13/12 0527 04/14/12 0518 04/15/12 0439  NA 133*  < > 131* 129* 133*  K 2.8*  < > 2.9* 4.0 3.7  CL 92*  < > 97 96 95*  CO2 27  < > 24 25 27   GLUCOSE 118*  < > 90 93 97  BUN 12  < > 11 10 12   CREATININE 0.73  < > 0.58 0.54 0.58  CALCIUM 9.9  < > 8.3* 8.6 9.1  MG 1.4*  --  1.5  --   --   < > = values in this interval not displayed. Liver Function Tests:  Recent Labs  04/09/12 2136 04/10/12 0458  AST 53* 66*  ALT 32 33    ALKPHOS 148* 134*  BILITOT 0.8 0.7  PROT 7.3 6.8  ALBUMIN 4.1 3.6   No results found for this basename: LIPASE, AMYLASE,  in the last 8760 hours No results found for this basename: AMMONIA,  in the last 8760 hours CBC:  Recent Labs  04/09/12 2136 04/10/12 0458 04/12/12 0532 04/13/12 0527  WBC 9.5 11.7* 10.5 8.9  NEUTROABS 7.4  --   --   --   HGB 13.2 12.9 10.7* 9.7*  HCT 38.4 38.2 32.2* 28.3*  MCV  84.0 84.5 87.0 86.3  PLT 225 230 196 187   Cardiac Enzymes:  Recent Labs  04/09/12 2136  CKTOTAL 643*  TROPONINI <0.30   BNP: No components found with this basename: POCBNP,  CBG:  Recent Labs  04/09/12 2125  GLUCAP 109*   TSH:  Recent Labs  04/10/12 0139  TSH 4.839*   A1C: No results found for this basename: HGBA1C   Lipid Panel:  Recent Labs  04/12/12 0535  CHOL 168  HDL 52  LDLCALC 98  TRIG 89  CHOLHDL 3.2    Basic Metabolic Panel  Result: 11/08/2012 4:06 PM ( Status: F )  Sodium 140 135-145 mEq/L SLN  Potassium 3.7 3.5-5.3 mEq/L SLN  Chloride 105 96-112 mEq/L SLN  CO2 28 19-32 mEq/L SLN  Glucose 86 70-99 mg/dL SLN  BUN 16 1-61 mg/dL SLN  Creatinine 0.96 0.45-4.09 mg/dL SLN  Calcium 9.7 8.1-19.1 mg/dL SLN  TSH, Ultrasensitive  Result: 11/08/2012 5:22 PM ( Status: F )  TSH 2.025 0.350-4.500 uIU/mL SLN     Assessment/Plan 1. Dementia -stable on current medications, does well in current environement  2. HTN (hypertension) -Patient is stable; continue current regimen. Will monitor and make changes as necessary.  3. Loss of weight -conts on Remeron and supplements -weight gain in the last month -increase in appetite per pt  4. Anxiety disorder -stable on current medications  5. Nonspecific pain in the lumbar region -placed on prednisone by ortho, may need injections if no improvement -will place pt on zantac due to side effects of GERD with being on prednisone

## 2013-03-07 ENCOUNTER — Non-Acute Institutional Stay (SKILLED_NURSING_FACILITY): Payer: Medicare Other | Admitting: Nurse Practitioner

## 2013-03-07 DIAGNOSIS — I10 Essential (primary) hypertension: Secondary | ICD-10-CM

## 2013-03-07 DIAGNOSIS — F411 Generalized anxiety disorder: Secondary | ICD-10-CM

## 2013-03-07 DIAGNOSIS — F419 Anxiety disorder, unspecified: Secondary | ICD-10-CM

## 2013-03-07 DIAGNOSIS — E039 Hypothyroidism, unspecified: Secondary | ICD-10-CM

## 2013-03-07 DIAGNOSIS — M1990 Unspecified osteoarthritis, unspecified site: Secondary | ICD-10-CM

## 2013-03-07 DIAGNOSIS — R634 Abnormal weight loss: Secondary | ICD-10-CM

## 2013-03-07 NOTE — Progress Notes (Signed)
Patient ID: Ebony DevonNancy C [name], female   DOB: 1931-07-31, 80 y.o.   MRN: [id number]    Nursing Home Location:  [facility]   Place of Service: SNF (31)  PCP: Harlow AsaLUKING,W S, MD  Allergies  Allergen Reactions  . Penicillins Diarrhea  . Ace Inhibitors   . Biaxin [Clarithromycin] Diarrhea  . Sulfa Antibiotics Diarrhea  . Cozaar [Losartan Potassium] Rash    Chief Complaint  Patient presents with  . Medical Managment of Chronic Issues    HPI:  80 year old female with history of memory loss, anxiety and depression, HTN, a fib, hypothyroidism, subdural hematoma, and stroke who is being seen [facility] at [facility] for routine visit of chronic conditions; pt reports she has been doing good since last visit without any new issues; eating well with good appetite, still having back pain and following up with ortho this week.   Reassessment of ongoing issues:  Dementia-stable; no changes in memory loss in the last month; taking namenda and aricept  Weight loss- pt reports improved appetite; has had additional weight gain in the last month  HTN- stable on current medications, tolerating medications without side effects  Anxiety and depression-mood remains stable on cymbalta  Review of Systems:  Review of Systems  Constitutional: Negative for fever, chills and malaise/fatigue.  Respiratory: Negative for cough and shortness of breath.   Cardiovascular: Negative for chest pain and leg swelling.  Gastrointestinal: Negative for heartburn, abdominal pain, diarrhea and constipation.  Genitourinary: Negative for dysuria, urgency and frequency.  Musculoskeletal: Positive for back pain (unchanged).  Skin: Negative.   Neurological: Negative for dizziness, weakness and headaches.  Psychiatric/Behavioral: Negative for depression. The patient is not nervous/anxious and does not have insomnia.      Past Medical History  Diagnosis Date  . Hypertension   . Subarachnoid hemorrhage following  injury 05/16/2011  . Unspecified hypothyroidism 04/15/2012  . SDH (subdural hematoma) 05/16/2011  . Hiatal hernia   . Allergic rhinitis   . Fibrocystic breast disease   . Anxiety   . IFG (impaired fasting glucose)   . Arthritis     Knees  . Hyperlipidemia    Past Surgical History  Procedure Laterality Date  . Abdominal hysterectomy    . Breast surgery    . Tonsillectomy    . Eye surgery     Social History:   reports that she has never smoked. She does not have any smokeless tobacco history on file. She reports that she does not drink alcohol or use illicit drugs.  Family History  Problem Relation Age of Onset  . CVA Neg Hx   . Diabetes Neg Hx   . Hypertension Neg Hx   . Lung cancer Sister     Medications: Patient's Medications  New Prescriptions   No medications on file  Previous Medications   ACETAMINOPHEN (TYLENOL) 325 MG TABLET    Take 2 tablets (650 mg total) by mouth every 6 (six) hours as needed.   ALPRAZOLAM (XANAX) 0.5 MG TABLET    Take 1 tablet (0.5 mg total) by mouth at bedtime. For anxiety   CETIRIZINE (ZYRTEC) 10 MG TABLET    Take 10 mg by mouth daily.   CRANBERRY 425 MG CAPS    Take by mouth daily.   DILTIAZEM (CARDIZEM CD) 120 MG 24 HR CAPSULE    Take 120 mg by mouth daily.   DONEPEZIL (ARICEPT) 5 MG TABLET    Take 10 mg by mouth at bedtime as needed.  DULOXETINE (CYMBALTA) 30 MG CAPSULE    Take 30 mg by mouth daily.   LEVOTHYROXINE (SYNTHROID, LEVOTHROID) 25 MCG TABLET    Take 1 tablet (25 mcg total) by mouth daily before breakfast.   MEMANTINE (NAMENDA) 10 MG TABLET    Take 10 mg by mouth 2 (two) times daily.   METOPROLOL (TOPROL-XL) 100 MG 24 HR TABLET    Take 100 mg by mouth every morning.     MIRTAZAPINE (REMERON) 15 MG TABLET    Take 15 mg by mouth at bedtime.   MULTIPLE VITAMIN (MULITIVITAMIN WITH MINERALS) TABS    Take 1 tablet by mouth every morning.   NAPROXEN SODIUM (ANAPROX) 220 MG TABLET    Take 220 mg by mouth 3 (three) times daily as needed.     TRIAMTERENE-HYDROCHLOROTHIAZIDE (MAXZIDE-25) 37.5-25 MG PER TABLET    Take 1 tablet by mouth daily.  Modified Medications   No medications on file  Discontinued Medications   No medications on file     Physical Exam:  Filed Vitals:   03/07/13 1230  BP: 131/57  Pulse: 69  Temp: 96.2 F (35.7 C)  Resp: 20  Weight: 125 lb (56.7 kg)   Physical Exam  Constitutional: She is well-developed, well-nourished, and in no distress. No distress.  HENT:  Head: Normocephalic and atraumatic.  Right Ear: External ear normal.  Left Ear: External ear normal.  Nose: Nose normal.  Mouth/Throat: Oropharynx is clear and moist. No oropharyngeal exudate.  Eyes: Conjunctivae and EOM are normal. Pupils are equal, round, and reactive to light. Right eye exhibits no discharge. Left eye exhibits no discharge.  Neck: Normal range of motion. Neck supple. No thyromegaly present.  Cardiovascular: Normal rate, regular rhythm, normal heart sounds and intact distal pulses.   Pulmonary/Chest: Effort normal and breath sounds normal. No respiratory distress. She has no wheezes.  Abdominal: Soft. Bowel sounds are normal. She exhibits no distension. There is no tenderness.  Musculoskeletal: Normal range of motion. She exhibits tenderness (lumbar spine). She exhibits no edema.  Lymphadenopathy:    She has no cervical adenopathy.  Neurological: She is alert. She has normal strength.  Skin: Skin is warm and dry. She is not diaphoretic. No erythema.  Psychiatric: Affect normal.       Labs reviewed: Basic Metabolic Panel:  Recent Labs  81/19/14 2136  04/13/12 0527 04/14/12 0518 04/15/12 0439  NA 133*  < > 131* 129* 133*  K 2.8*  < > 2.9* 4.0 3.7  CL 92*  < > 97 96 95*  CO2 27  < > 24 25 27   GLUCOSE 118*  < > 90 93 97  BUN 12  < > 11 10 12   CREATININE 0.73  < > 0.58 0.54 0.58  CALCIUM 9.9  < > 8.3* 8.6 9.1  MG 1.4*  --  1.5  --   --   < > = values in this interval not displayed. Liver Function  Tests:  Recent Labs  04/09/12 2136 04/10/12 0458  AST 53* 66*  ALT 32 33  ALKPHOS 148* 134*  BILITOT 0.8 0.7  PROT 7.3 6.8  ALBUMIN 4.1 3.6   No results found for this basename: LIPASE, AMYLASE,  in the last 8760 hours No results found for this basename: AMMONIA,  in the last 8760 hours CBC:  Recent Labs  04/09/12 2136 04/10/12 0458 04/12/12 0532 04/13/12 0527  WBC 9.5 11.7* 10.5 8.9  NEUTROABS 7.4  --   --   --  HGB 13.2 12.9 10.7* 9.7*  HCT 38.4 38.2 32.2* 28.3*  MCV 84.0 84.5 87.0 86.3  PLT 225 230 196 187   Cardiac Enzymes:  Recent Labs  04/09/12 2136  CKTOTAL 643*  TROPONINI <0.30   BNP: No components found with this basename: POCBNP,  CBG:  Recent Labs  04/09/12 2125  GLUCAP 109*   TSH:  Recent Labs  04/10/12 0139  TSH 4.839*   A1C: No results found for this basename: HGBA1C   Lipid Panel:  Recent Labs  04/12/12 0535  CHOL 168  HDL 52  LDLCALC 98  TRIG 89  CHOLHDL 3.2   Basic Metabolic Panel  Result: 11/08/2012 4:06 PM ( Status: F )  Sodium 140 135-145 mEq/L SLN  Potassium 3.7 3.5-5.3 mEq/L SLN  Chloride 105 96-112 mEq/L SLN  CO2 28 19-32 mEq/L SLN  Glucose 86 70-99 mg/dL SLN  BUN 16 1-61 mg/dL SLN  Creatinine 0.96 0.45-4.09 mg/dL SLN  Calcium 9.7 8.1-19.1 mg/dL SLN  TSH, Ultrasensitive  Result: 11/08/2012 5:22 PM ( Status: F )  TSH 2.025 0.350-4.500 uIU/mL SLN  Basic Metabolic Panel    Result: 01/08/2013 4:44 PM   ( Status: F )       Sodium 138     135-145 mEq/L SLN   Potassium 4.7     3.5-5.3 mEq/L SLN   Chloride 104     96-112 mEq/L SLN   CO2 28     19-32 mEq/L SLN   Glucose 103   H 70-99 mg/dL SLN   BUN 24   H 4-78 mg/dL SLN   Creatinine 2.95     0.50-1.10 mg/dL SLN   Calcium 9.3     6.2-13.0 mg/dL SLN   TSH, Ultrasensitive    Result: 01/08/2013 5:10 PM   ( Status: F )       TSH 2.069  Assessment/Plan 1. Unspecified hypothyroidism Patient is stable; continue current regimen. Will monitor and make changes as  necessary.  2. Loss of weight -conts with increase in appetite and weight gain -will cont Remeron and supplements   3. Nonspecific pain in the lumbar region  -placed on prednisone by ortho, reported still having pain at times-- going back for follow up this week; may need injections per ortho -will cont to monitor -has norco as needed  4. Anxiety disorder -stable on current medications  5. HTN (hypertension) Patient is stable; continue current regimen. Will monitor and make changes as necessary.

## 2013-04-04 ENCOUNTER — Encounter: Payer: Self-pay | Admitting: Nurse Practitioner

## 2013-04-04 ENCOUNTER — Non-Acute Institutional Stay (SKILLED_NURSING_FACILITY): Payer: Medicare Other | Admitting: Nurse Practitioner

## 2013-04-04 DIAGNOSIS — R634 Abnormal weight loss: Secondary | ICD-10-CM

## 2013-04-04 DIAGNOSIS — G459 Transient cerebral ischemic attack, unspecified: Secondary | ICD-10-CM

## 2013-04-04 DIAGNOSIS — F0150 Vascular dementia without behavioral disturbance: Secondary | ICD-10-CM

## 2013-04-04 DIAGNOSIS — E039 Hypothyroidism, unspecified: Secondary | ICD-10-CM

## 2013-04-04 DIAGNOSIS — I10 Essential (primary) hypertension: Secondary | ICD-10-CM

## 2013-04-04 DIAGNOSIS — M1990 Unspecified osteoarthritis, unspecified site: Secondary | ICD-10-CM

## 2013-04-04 NOTE — Progress Notes (Signed)
Patient ID: Ebony [name], female   DOB: 21-Jan-1932, 80 y.o.   MRN: [id number]    Nursing Home Location:  [facility]   Place of Service: SNF (31)  PCP: Harlow Asa, MD  Allergies  Allergen Reactions  . Penicillins Diarrhea  . Ace Inhibitors   . Biaxin [Clarithromycin] Diarrhea  . Sulfa Antibiotics Diarrhea  . Cozaar [Losartan Potassium] Rash    Chief Complaint  Patient presents with  . Medical Managment of Chronic Issues    HPI:  80 year old female with history of memory loss, anxiety and depression, HTN, a fib, hypothyroidism, subdural hematoma, and stroke who is being seen today at [facility] for routine visit of chronic conditions. Ms. Wermuth has been doing fairly well since last visit except for a bout of GI upset this weekend after eating, "collard greens that weren't cooked thoroughly". She experienced gas, bloating, and diarrhea for 2 days. These symptoms have resolved and she is feeling better, but is still eating bland foods and mildly fatigued. Back pain has been well managed with prn Tylenol. She is somewhat anxious about a friend whom she can't reach by phone. Otherwise reports she is feeling, "okay".  Reassessment of ongoing issues:  Dementia-stable; no changes in memory loss in the last month; taking namenda and aricept  Weight loss- pt reports improved appetite; weight is stable and pt has not lost any significant weight since last exam HTN- stable on current medications, tolerating medications without side effects  Anxiety and depression-mood remains stable on cymbalta   Review of Systems:  Review of Systems  Constitutional: Positive for malaise/fatigue. Negative for fever and chills.  Respiratory: Negative for cough, sputum production and shortness of breath.   Cardiovascular: Negative for chest pain and leg swelling.  Gastrointestinal: Negative for heartburn, nausea, vomiting, abdominal pain, diarrhea and blood in stool.  Genitourinary:  Negative for dysuria, urgency and flank pain.  Neurological: Positive for weakness. Negative for dizziness.  Psychiatric/Behavioral: Positive for memory loss. Negative for depression and hallucinations. The patient is nervous/anxious. The patient does not have insomnia.      Past Medical History  Diagnosis Date  . Hypertension   . Subarachnoid hemorrhage following injury 05/16/2011  . Unspecified hypothyroidism 04/15/2012  . SDH (subdural hematoma) 05/16/2011  . Hiatal hernia   . Allergic rhinitis   . Fibrocystic breast disease   . Anxiety   . IFG (impaired fasting glucose)   . Arthritis     Knees  . Hyperlipidemia    Past Surgical History  Procedure Laterality Date  . Abdominal hysterectomy    . Breast surgery    . Tonsillectomy    . Eye surgery     Social History:   reports that she has never smoked. She does not have any smokeless tobacco history on file. She reports that she does not drink alcohol or use illicit drugs.  Family History  Problem Relation Age of Onset  . CVA Neg Hx   . Diabetes Neg Hx   . Hypertension Neg Hx   . Lung cancer Sister     Medications: Patient's Medications  New Prescriptions   No medications on file  Previous Medications   ACETAMINOPHEN (TYLENOL) 325 MG TABLET    Take 2 tablets (650 mg total) by mouth every 6 (six) hours as needed.   ALPRAZOLAM (XANAX) 0.5 MG TABLET    Take 1 tablet (0.5 mg total) by mouth at bedtime. For anxiety   ASPIRIN 81 MG CHEWABLE TABLET  Chew 81 mg by mouth daily.   CETIRIZINE (ZYRTEC) 10 MG TABLET    Take 10 mg by mouth daily.   CRANBERRY 425 MG CAPS    Take by mouth daily.   DILTIAZEM (CARDIZEM CD) 120 MG 24 HR CAPSULE    Take 120 mg by mouth daily.   DONEPEZIL (ARICEPT) 5 MG TABLET    Take 10 mg by mouth at bedtime as needed.    DULOXETINE (CYMBALTA) 30 MG CAPSULE    Take 30 mg by mouth daily.   LEVOTHYROXINE (SYNTHROID, LEVOTHROID) 25 MCG TABLET    Take 1 tablet (25 mcg total) by mouth daily before  breakfast.   MEMANTINE (NAMENDA) 10 MG TABLET    Take 10 mg by mouth 2 (two) times daily.   METOPROLOL (TOPROL-XL) 100 MG 24 HR TABLET    Take 100 mg by mouth every morning.     MIRTAZAPINE (REMERON) 15 MG TABLET    Take 15 mg by mouth at bedtime.   MULTIPLE VITAMIN (MULITIVITAMIN WITH MINERALS) TABS    Take 1 tablet by mouth every morning.   NAPROXEN SODIUM (ANAPROX) 220 MG TABLET    Take 220 mg by mouth 3 (three) times daily as needed.   TRIAMTERENE-HYDROCHLOROTHIAZIDE (MAXZIDE-25) 37.5-25 MG PER TABLET    Take 1 tablet by mouth daily.  Modified Medications   No medications on file  Discontinued Medications   No medications on file     Physical Exam: Physical Exam  Nursing note and vitals reviewed. Constitutional: She is oriented to person, place, and time.  HENT:  Head: Normocephalic and atraumatic.  Neck: Normal range of motion. Neck supple.  Cardiovascular: Normal rate and regular rhythm.   Pulmonary/Chest: Effort normal and breath sounds normal. No respiratory distress. She has no wheezes. She exhibits no tenderness.  Abdominal: Soft. Bowel sounds are normal. She exhibits no distension and no mass. There is no tenderness.  Musculoskeletal: Normal range of motion. She exhibits no edema and no tenderness.  Neurological: She is alert and oriented to person, place, and time. Coordination normal.  Skin: Skin is warm and dry. No rash noted. No erythema. There is pallor.  Psychiatric: Affect and judgment normal.     Filed Vitals:   04/04/13 0945  BP: 125/57  Pulse: 57  Temp: 96 F (35.6 C)  Resp: 20  Weight: 125 lb (56.7 kg)      Labs reviewed: Basic Metabolic Panel:  Recent Labs  40/98/11 2136  04/13/12 0527 04/14/12 0518 04/15/12 0439  NA 133*  < > 131* 129* 133*  K 2.8*  < > 2.9* 4.0 3.7  CL 92*  < > 97 96 95*  CO2 27  < > 24 25 27   GLUCOSE 118*  < > 90 93 97  BUN 12  < > 11 10 12   CREATININE 0.73  < > 0.58 0.54 0.58  CALCIUM 9.9  < > 8.3* 8.6 9.1  MG 1.4*   --  1.5  --   --   < > = values in this interval not displayed. Liver Function Tests:  Recent Labs  04/09/12 2136 04/10/12 0458  AST 53* 66*  ALT 32 33  ALKPHOS 148* 134*  BILITOT 0.8 0.7  PROT 7.3 6.8  ALBUMIN 4.1 3.6   No results found for this basename: LIPASE, AMYLASE,  in the last 8760 hours No results found for this basename: AMMONIA,  in the last 8760 hours CBC:  Recent Labs  04/09/12 2136 04/10/12 0458 04/12/12  0532 04/13/12 0527  WBC 9.5 11.7* 10.5 8.9  NEUTROABS 7.4  --   --   --   HGB 13.2 12.9 10.7* 9.7*  HCT 38.4 38.2 32.2* 28.3*  MCV 84.0 84.5 87.0 86.3  PLT 225 230 196 187   Cardiac Enzymes:  Recent Labs  04/09/12 2136  CKTOTAL 643*  TROPONINI <0.30   BNP: No components found with this basename: POCBNP,  CBG:  Recent Labs  04/09/12 2125  GLUCAP 109*   TSH:  Recent Labs  04/10/12 0139  TSH 4.839*   A1C: No results found for this basename: HGBA1C   Lipid Panel:  Recent Labs  04/12/12 0535  CHOL 168  HDL 52  LDLCALC 98  TRIG 89  CHOLHDL 3.2    Basic Metabolic Panel  Result: 11/08/2012 4:06 PM ( Status: F )  Sodium 140 135-145 mEq/L SLN  Potassium 3.7 3.5-5.3 mEq/L SLN  Chloride 105 96-112 mEq/L SLN  CO2 28 19-32 mEq/L SLN  Glucose 86 70-99 mg/dL SLN  BUN 16 1-616-23 mg/dL SLN  Creatinine 0.960.66 0.45-4.090.50-1.10 mg/dL SLN  Calcium 9.7 8.1-19.18.4-10.5 mg/dL SLN  TSH, Ultrasensitive  Result: 11/08/2012 5:22 PM ( Status: F )  TSH 2.025 0.350-4.500 uIU/mL SLN  Basic Metabolic Panel  Result: 01/08/2013 4:44 PM ( Status: F )  Sodium 138 135-145 mEq/L SLN  Potassium 4.7 3.5-5.3 mEq/L SLN  Chloride 104 96-112 mEq/L SLN  CO2 28 19-32 mEq/L SLN  Glucose 103 H 70-99 mg/dL SLN  BUN 24 H 4-786-23 mg/dL SLN  Creatinine 2.950.68 6.21-3.080.50-1.10 mg/dL SLN  Calcium 9.3 6.5-78.48.4-10.5 mg/dL SLN  TSH, Ultrasensitive  Result: 01/08/2013 5:10 PM ( Status: F )  TSH 2.069   Assessment/Plan 1. HTN (hypertension) BP stable on current regimen. Will continue her Maxzide for  now.  2. Loss of weight Is maintaining weight at 125.8. Will continue Remeron and supplements. Monitor weights and intake closely.  Recent GI upset resolved, but will follow up for return of GI symptoms.  3. Unspecified hypothyroidism Last TSH in 11/9. Stable on current Synthroid dose.   4. Vascular dementia without behavioral disturbance memory loss stable; short-term > long-term. No acute exacerbations. Will continue Aricept and Namenda.  5. OA (osteoarthritis)  Pain controlled with Naproxen and prn Acetaminophen. Patient continues to be followed by ortho. Will check CBC.  6. Anxiety disorder Xanax at bedtime is helpful for patient. Will continue dose/prn schedule.  7. TIA,hx of   Continues on ASA therapy. Will check Lipid panel with next labs.

## 2013-05-02 ENCOUNTER — Encounter: Payer: Self-pay | Admitting: Nurse Practitioner

## 2013-05-02 ENCOUNTER — Non-Acute Institutional Stay (SKILLED_NURSING_FACILITY): Payer: Medicare Other | Admitting: Nurse Practitioner

## 2013-05-02 DIAGNOSIS — F411 Generalized anxiety disorder: Secondary | ICD-10-CM

## 2013-05-02 DIAGNOSIS — E039 Hypothyroidism, unspecified: Secondary | ICD-10-CM

## 2013-05-02 DIAGNOSIS — F419 Anxiety disorder, unspecified: Secondary | ICD-10-CM

## 2013-05-02 DIAGNOSIS — M1990 Unspecified osteoarthritis, unspecified site: Secondary | ICD-10-CM

## 2013-05-02 DIAGNOSIS — F0150 Vascular dementia without behavioral disturbance: Secondary | ICD-10-CM

## 2013-05-02 DIAGNOSIS — R300 Dysuria: Secondary | ICD-10-CM

## 2013-05-02 DIAGNOSIS — R634 Abnormal weight loss: Secondary | ICD-10-CM

## 2013-05-02 DIAGNOSIS — I10 Essential (primary) hypertension: Secondary | ICD-10-CM

## 2013-05-02 NOTE — Progress Notes (Signed)
Patient ID: Ebony [name], female   DOB: 31-May-1931, 80 y.o.   MRN: [id number]    Nursing Home Location:  [facility]   Place of Service: SNF (31)  PCP: Harlow Asa, MD  Allergies  Allergen Reactions  . Penicillins Diarrhea  . Ace Inhibitors   . Biaxin [Clarithromycin] Diarrhea  . Sulfa Antibiotics Diarrhea  . Cozaar [Losartan Potassium] Rash    Chief Complaint  Patient presents with  . Medical Managment of Chronic Issues    HPI:  80 year old female with history of memory loss, anxiety and depression, HTN, a fib, hypothyroidism, hx of subdural hematoma, and stroke who is being seen today for routine visit of chronic conditions. pt reports worsening back pain due to temporary mattress she is sleeping on; relieved with PRN medication however she does not like taking pills. She reports when she doesn't she finds it hard to eat and with decrease appetite over the past few days; pt also with increased pain on urinartion and reports it was at its worse yesterday, urine has been taken and sent to the lab for culture and sensitivy  No fevers or chills.  Reassessment of ongoing issues:  Dementia-stable; no changes in memory loss in the last month; taking namenda and aricept  Weight loss- pt reports decreased appetite; weight is stable and pt has not lost any significant weight loss since last exam but is without gain.  HTN- stable on current medications, tolerating medications without side effects  Anxiety and depression-mood remains stable on cymbalta   Review of Systems:  Review of Systems  Constitutional: Negative for fever, chills and malaise/fatigue.  Respiratory: Negative for cough, sputum production and shortness of breath.   Cardiovascular: Negative for chest pain and leg swelling.  Gastrointestinal: Negative for heartburn, nausea, vomiting, abdominal pain, diarrhea and blood in stool.  Genitourinary: Positive for dysuria and frequency. Negative for urgency and  flank pain.  Musculoskeletal: Positive for back pain (worse due to mattress).  Skin: Negative.   Neurological: Negative for dizziness, sensory change, focal weakness, weakness and headaches.  Psychiatric/Behavioral: Positive for memory loss. Negative for depression and hallucinations. The patient is nervous/anxious (stable). The patient does not have insomnia.      Past Medical History  Diagnosis Date  . Hypertension   . Subarachnoid hemorrhage following injury 05/16/2011  . Unspecified hypothyroidism 04/15/2012  . SDH (subdural hematoma) 05/16/2011  . Hiatal hernia   . Allergic rhinitis   . Fibrocystic breast disease   . Anxiety   . IFG (impaired fasting glucose)   . Arthritis     Knees  . Hyperlipidemia    Past Surgical History  Procedure Laterality Date  . Abdominal hysterectomy    . Breast surgery    . Tonsillectomy    . Eye surgery     Social History:   reports that she has never smoked. She does not have any smokeless tobacco history on file. She reports that she does not drink alcohol or use illicit drugs.  Family History  Problem Relation Age of Onset  . CVA Neg Hx   . Diabetes Neg Hx   . Hypertension Neg Hx   . Lung cancer Sister     Medications: Patient's Medications  New Prescriptions   No medications on file  Previous Medications   ACETAMINOPHEN (TYLENOL) 325 MG TABLET    Take 2 tablets (650 mg total) by mouth every 6 (six) hours as needed.   ALPRAZOLAM (XANAX) 0.5 MG TABLET  Take 1 tablet (0.5 mg total) by mouth at bedtime. For anxiety   ASPIRIN 81 MG CHEWABLE TABLET    Chew 81 mg by mouth daily.   CETIRIZINE (ZYRTEC) 10 MG TABLET    Take 10 mg by mouth daily.   CRANBERRY 425 MG CAPS    Take by mouth daily.   DILTIAZEM (CARDIZEM CD) 120 MG 24 HR CAPSULE    Take 120 mg by mouth daily.   DONEPEZIL (ARICEPT) 5 MG TABLET    Take 10 mg by mouth at bedtime as needed.    DULOXETINE (CYMBALTA) 30 MG CAPSULE    Take 30 mg by mouth daily.   LEVOTHYROXINE  (SYNTHROID, LEVOTHROID) 25 MCG TABLET    Take 1 tablet (25 mcg total) by mouth daily before breakfast.   MEMANTINE (NAMENDA) 10 MG TABLET    Take 10 mg by mouth 2 (two) times daily.   METOPROLOL (TOPROL-XL) 100 MG 24 HR TABLET    Take 100 mg by mouth every morning.     MIRTAZAPINE (REMERON) 15 MG TABLET    Take 15 mg by mouth at bedtime.   MULTIPLE VITAMIN (MULITIVITAMIN WITH MINERALS) TABS    Take 1 tablet by mouth every morning.   NAPROXEN SODIUM (ANAPROX) 220 MG TABLET    Take 220 mg by mouth 3 (three) times daily as needed.   TRIAMTERENE-HYDROCHLOROTHIAZIDE (MAXZIDE-25) 37.5-25 MG PER TABLET    Take 1 tablet by mouth daily.  Modified Medications   No medications on file  Discontinued Medications   No medications on file     Physical Exam:  Filed Vitals:   05/02/13 1103  BP: 115/65  Pulse: 56  Temp: 97.9 F (36.6 C)  Resp: 20  Weight: 126 lb (57.153 kg)    Physical Exam  Nursing note and vitals reviewed. Constitutional: She is oriented to person, place, and time and well-developed, well-nourished, and in no distress.  HENT:  Head: Normocephalic and atraumatic.  Eyes: Conjunctivae and EOM are normal. Pupils are equal, round, and reactive to light.  Cardiovascular: Normal rate and regular rhythm.   Pulmonary/Chest: Effort normal and breath sounds normal. No respiratory distress. She has no wheezes. She exhibits no tenderness.  Abdominal: Soft. Bowel sounds are normal. She exhibits no distension and no mass. There is no tenderness.  No cva tenderness  Musculoskeletal: Normal range of motion. She exhibits tenderness (to lumbar spine). She exhibits no edema.  Neurological: She is alert and oriented to person, place, and time. Coordination normal.  Skin: Skin is warm and dry. No rash noted. No erythema. There is pallor.  Psychiatric: Affect and judgment normal.     Labs reviewed: Basic Metabolic Panel  Result: 11/08/2012 4:06 PM ( Status: F )  Sodium 140 135-145 mEq/L SLN    Potassium 3.7 3.5-5.3 mEq/L SLN  Chloride 105 96-112 mEq/L SLN  CO2 28 19-32 mEq/L SLN  Glucose 86 70-99 mg/dL SLN  BUN 16 4-136-23 mg/dL SLN  Creatinine 2.440.66 0.10-2.720.50-1.10 mg/dL SLN  Calcium 9.7 5.3-66.48.4-10.5 mg/dL SLN  TSH, Ultrasensitive  Result: 11/08/2012 5:22 PM ( Status: F )  TSH 2.025 0.350-4.500 uIU/mL SLN  Basic Metabolic Panel  Result: 01/08/2013 4:44 PM ( Status: F )  Sodium 138 135-145 mEq/L SLN  Potassium 4.7 3.5-5.3 mEq/L SLN  Chloride 104 96-112 mEq/L SLN  CO2 28 19-32 mEq/L SLN  Glucose 103 H 70-99 mg/dL SLN  BUN 24 H 4-036-23 mg/dL SLN  Creatinine 4.740.68 2.59-5.630.50-1.10 mg/dL SLN  Calcium 9.3 8.7-56.48.4-10.5 mg/dL SLN  TSH, Ultrasensitive  Result: 01/08/2013 5:10 PM ( Status: F )  TSH 2.069 CBC NO Diff (Complete Blood Count)    Result: 04/05/2013 12:15 PM   ( Status: F )     C WBC 12.6   H 4.0-10.5 K/uL SLN   RBC 5.03     3.87-5.11 MIL/uL SLN   Hemoglobin 14.6     12.0-15.0 g/dL SLN   Hematocrit 16.1     36.0-46.0 % SLN   MCV 85.3     78.0-100.0 fL SLN   MCH 29.0     26.0-34.0 pg SLN   MCHC 34.0     30.0-36.0 g/dL SLN   RDW 09.6     04.5-40.9 % SLN   Platelet Count 296     150-400 K/uL SLN   Lipid Profile    Result: 04/05/2013 12:06 PM   ( Status: F )       Cholesterol 163     0-200 mg/dL SLN C Triglyceride 811   H <150 mg/dL SLN   HDL Cholesterol 53     >39 mg/dL SLN   Total Chol/HDL Ratio 3.1      Ratio SLN   VLDL Cholesterol (Calc) 35     0-40 mg/dL SLN   LDL Cholesterol (Calc) 75   SH, Ultrasensitive    Result: 04/08/2013 7:24 PM   ( Status: F )     C TSH 1.481     0.350-4.500 UIU/mL    Urinalysis rflx Microscopic    Result: 05/02/2013 6:21 AM   ( Status: P )       Color YELLOW     YELLOW  SLN   Appearance CLEAR     CLEAR  SLN   Specific Gravity 1.007     1.005-1.030  SLN   pH 6.0     5.0-8.0  SLN   Glucose NEG     NEG mg/dL SLN   Bilirubin NEG     NEG  SLN   Ketone NEG     NEG mg/dL SLN   Blood NEG     NEG  SLN   Protein NEG     NEG mg/dL SLN   Urobilinogen 0.2      0.0-1.0 mg/dL SLN   Nitrite NEG     NEG  SLN   Leukocyte Esterase MOD   A NEG  SLN   Urine Microscopic    Result: 05/02/2013 8:20 AM   ( Status: P )       Squamous Epithelial/ HPF NONE SEEN     RARE  SLN   Crystals NONE SEEN     NONE SEEN  SLN   Casts NONE SEEN     NONE SEEN  SLN   WBC 0-2     <3 WBC/hpf SLN C RBC 0-2     <3 RBC/hpf SLN   Bacteria/ HPF NONE SEEN       Assessment/Plan 1. HTN (hypertension) -Patients blood pressure is stable; continue current regimen. Will monitor and make changes as necessary.  2. OA (osteoarthritis) -worse due to temporary mattress, also UTI could be contributing to this -will schedule aleve TID for 5 days at this time.   3. Anxiety disorder -stable on cymbalta   4. Loss of weight -recent decrease in appetite, however associated with increased pain and pt with dysuria which could be contributing to decrease in appetite, encouraged to eat 3 meals a day and snacks, to ask for pain medication if needed, hopefully with the  treatment of possible UTI and back pain this will resolve   5. Vascular dementia without behavioral disturbance -no changes in the last month, conts namenda and aricept   6. Unspecified hypothyroidism -stable on synthroid 25 mcg  7. Dysuria -UA shows leukocytes; culture and sensitivities pending -due to pain will order pyridium TID PRN -encouraged water intake -will start macrobid 100 mg BID for 7 days

## 2013-05-16 ENCOUNTER — Non-Acute Institutional Stay (SKILLED_NURSING_FACILITY): Payer: Medicare Other | Admitting: Nurse Practitioner

## 2013-05-16 DIAGNOSIS — F419 Anxiety disorder, unspecified: Secondary | ICD-10-CM

## 2013-05-16 DIAGNOSIS — R634 Abnormal weight loss: Secondary | ICD-10-CM

## 2013-05-16 DIAGNOSIS — F411 Generalized anxiety disorder: Secondary | ICD-10-CM

## 2013-05-16 DIAGNOSIS — N390 Urinary tract infection, site not specified: Secondary | ICD-10-CM

## 2013-05-16 NOTE — Progress Notes (Signed)
Patient ID: Ebony [name], female   DOB: 11-May-1931, 80 y.o.   MRN: [id number]   Nursing Home Location:  [facility]   Place of Service: SNF (31)  PCP: Ebony Asa, MD  Allergies  Allergen Reactions  . Penicillins Diarrhea  . Ace Inhibitors   . Biaxin [Clarithromycin] Diarrhea  . Sulfa Antibiotics Diarrhea  . Cozaar [Losartan Potassium] Rash    Chief Complaint  Patient presents with  . Acute Visit    HPI:  Per nursing staff, Ebony [name] is requesting to be seen because she is feeling depressed, does not have an appetite, and feels generally weak. On interviewing the patient, she relates this to several days ago as she was recovering from a recent UTI. She feels the antibiotic caused her diarrhea, nausea, and weakness. She reports lethary and 'feeling miserable'. Today, however she is up OOB, dressed in [place] [employer] attire, watching TV, and talking on the phone to a family member. She reports her appetite has improved and that she has no diarrhea. She denies urinary complaint. A geriatric depression screening was completed and was negative for acute concern.  Review of Systems:  Review of Systems  Constitutional: Negative for fever, chills, weight loss and malaise/fatigue.  Respiratory: Negative for cough, sputum production and shortness of breath.   Cardiovascular: Negative for chest pain and leg swelling.  Gastrointestinal: Negative for heartburn, nausea, vomiting, abdominal pain, diarrhea and blood in stool.  Genitourinary: Negative for dysuria, urgency, frequency and flank pain.  Skin: Negative.   Neurological: Negative for dizziness, sensory change, focal weakness, weakness and headaches.  Psychiatric/Behavioral: Positive for memory loss. Negative for depression and hallucinations. The patient is nervous/anxious (stable). The patient does not have insomnia.      Past Medical History  Diagnosis Date  . Hypertension   . Subarachnoid hemorrhage following  injury 05/16/2011  . Unspecified hypothyroidism 04/15/2012  . SDH (subdural hematoma) 05/16/2011  . Hiatal hernia   . Allergic rhinitis   . Fibrocystic breast disease   . Anxiety   . IFG (impaired fasting glucose)   . Arthritis     Knees  . Hyperlipidemia    Past Surgical History  Procedure Laterality Date  . Abdominal hysterectomy    . Breast surgery    . Tonsillectomy    . Eye surgery     Social History:   reports that she has never smoked. She does not have any smokeless tobacco history on file. She reports that she does not drink alcohol or use illicit drugs.  Family History  Problem Relation Age of Onset  . CVA Neg Hx   . Diabetes Neg Hx   . Hypertension Neg Hx   . Lung cancer Sister     Medications: Patient's Medications  New Prescriptions   No medications on file  Previous Medications   ACETAMINOPHEN (TYLENOL) 325 MG TABLET    Take 2 tablets (650 mg total) by mouth every 6 (six) hours as needed.   ALPRAZOLAM (XANAX) 0.5 MG TABLET    Take 1 tablet (0.5 mg total) by mouth at bedtime. For anxiety   ASPIRIN 81 MG CHEWABLE TABLET    Chew 81 mg by mouth daily.   CETIRIZINE (ZYRTEC) 10 MG TABLET    Take 10 mg by mouth daily.   CRANBERRY 425 MG CAPS    Take by mouth daily.   DILTIAZEM (CARDIZEM CD) 120 MG 24 HR CAPSULE    Take 120 mg by mouth daily.   DONEPEZIL (  ARICEPT) 5 MG TABLET    Take 10 mg by mouth at bedtime as needed.    DULOXETINE (CYMBALTA) 30 MG CAPSULE    Take 30 mg by mouth daily.   LEVOTHYROXINE (SYNTHROID, LEVOTHROID) 25 MCG TABLET    Take 1 tablet (25 mcg total) by mouth daily before breakfast.   MEMANTINE (NAMENDA) 10 MG TABLET    Take 10 mg by mouth 2 (two) times daily.   METOPROLOL (TOPROL-XL) 100 MG 24 HR TABLET    Take 100 mg by mouth every morning.     MIRTAZAPINE (REMERON) 15 MG TABLET    Take 15 mg by mouth at bedtime.   MULTIPLE VITAMIN (MULITIVITAMIN WITH MINERALS) TABS    Take 1 tablet by mouth every morning.   NAPROXEN SODIUM (ANAPROX) 220 MG  TABLET    Take 220 mg by mouth 3 (three) times daily as needed.   TRIAMTERENE-HYDROCHLOROTHIAZIDE (MAXZIDE-25) 37.5-25 MG PER TABLET    Take 1 tablet by mouth daily.  Modified Medications   No medications on file  Discontinued Medications   No medications on file     Physical Exam: Filed Vitals:   05/16/13 1511  BP: 104/83  Pulse: 62  Temp: 97.5 F (36.4 C)  Resp: 18  Weight: 129 lb 3.2 oz (58.605 kg)  Physical Exam  Nursing note and vitals reviewed. Constitutional: She is oriented to person, place, and time and well-developed, well-nourished, and in no distress.  HENT:  Head: Normocephalic and atraumatic.  Eyes: Pupils are equal, round, and reactive to light.  Neck: Normal range of motion. Neck supple.  Cardiovascular: Normal rate, regular rhythm and normal heart sounds.   No murmur heard. Pulmonary/Chest: Effort normal and breath sounds normal. No respiratory distress. She exhibits no tenderness.  Abdominal: Soft. Bowel sounds are normal. She exhibits no distension and no mass. There is no tenderness.  Musculoskeletal: Normal range of motion. She exhibits no edema and no tenderness.  Neurological: She is alert and oriented to person, place, and time. GCS score is 15.  Gait slow, guarded in room (baseline)  Skin: Skin is warm and dry. No rash noted. Pallor: slightly.  Psychiatric: Mood, affect and judgment normal.  Chronic memory loss       Labs reviewed: Basic Metabolic Panel  Result: 11/08/2012 4:06 PM ( Status: F )  Sodium 140 135-145 mEq/L SLN  Potassium 3.7 3.5-5.3 mEq/L SLN  Chloride 105 96-112 mEq/L SLN  CO2 28 19-32 mEq/L SLN  Glucose 86 70-99 mg/dL SLN  BUN 16 1-61 mg/dL SLN  Creatinine 0.96 0.45-4.09 mg/dL SLN  Calcium 9.7 8.1-19.1 mg/dL SLN  TSH, Ultrasensitive  Result: 11/08/2012 5:22 PM ( Status: F )  TSH 2.025 0.350-4.500 uIU/mL SLN  Basic Metabolic Panel  Result: 01/08/2013 4:44 PM ( Status: F )  Sodium 138 135-145 mEq/L SLN  Potassium 4.7 3.5-5.3  mEq/L SLN  Chloride 104 96-112 mEq/L SLN  CO2 28 19-32 mEq/L SLN  Glucose 103 H 70-99 mg/dL SLN  BUN 24 H 4-78 mg/dL SLN  Creatinine 2.95 6.21-3.08 mg/dL SLN  Calcium 9.3 6.5-78.4 mg/dL SLN  TSH, Ultrasensitive  Result: 01/08/2013 5:10 PM ( Status: F )  TSH 2.069  CBC NO Diff (Complete Blood Count)  Result: 04/05/2013 12:15 PM ( Status: F ) C  WBC 12.6 H 4.0-10.5 K/uL SLN  RBC 5.03 3.87-5.11 MIL/uL SLN  Hemoglobin 14.6 12.0-15.0 g/dL SLN  Hematocrit 69.6 29.5-28.4 % SLN  MCV 85.3 78.0-100.0 fL SLN  MCH 29.0 26.0-34.0 pg SLN  MCHC  34.0 30.0-36.0 g/dL SLN  RDW 81.114.7 91.4-78.211.5-15.5 % SLN  Platelet Count 296 150-400 K/uL SLN  Lipid Profile  Result: 04/05/2013 12:06 PM ( Status: F )  Cholesterol 163 0-200 mg/dL SLN C  Triglyceride 956176 H <150 mg/dL SLN  HDL Cholesterol 53 >39 mg/dL SLN  Total Chol/HDL Ratio 3.1 Ratio SLN  VLDL Cholesterol (Calc) 35 2-130-40 mg/dL SLN  LDL Cholesterol (Calc) 75  SH, Ultrasensitive  Result: 04/08/2013 7:24 PM ( Status: F ) C  TSH 1.481 0.350-4.500 UIU/mL  Urinalysis rflx Microscopic  Result: 05/02/2013 6:21 AM ( Status: P )  Color YELLOW YELLOW SLN  Appearance CLEAR CLEAR SLN  Specific Gravity 1.007 1.005-1.030 SLN  pH 6.0 5.0-8.0 SLN  Glucose NEG NEG mg/dL SLN  Bilirubin NEG NEG SLN  Ketone NEG NEG mg/dL SLN  Blood NEG NEG SLN  Protein NEG NEG mg/dL SLN  Urobilinogen 0.2 0.8-6.50.0-1.0 mg/dL SLN  Nitrite NEG NEG SLN  Leukocyte Esterase MOD A NEG SLN  Urine Microscopic  Result: 05/02/2013 8:20 AM ( Status: P )  Squamous Epithelial/ HPF NONE SEEN RARE SLN  Crystals NONE SEEN NONE SEEN SLN  Casts NONE SEEN NONE SEEN SLN  WBC 0-2 <3 WBC/hpf SLN C  RBC 0-2 <3 RBC/hpf SLN  Bacteria/ HPF NONE SEEN    Assessment/Plan 1. Anxiety disorder Ms. Ballon reports she is feeling much better today and is not concerned about her previous complaints now as they have resolved. We will closely monitor her for any recurrent issues with mood, or anxiety. We will maintain her  medication regimen as it currently is prescribed.  2. Loss of weight  -recent decrease in appetite, however pt is still maintained weight and decrease in appetite as resolve   3. UTI -finished treatment of macrobid, Culture did not reveal any significant colonies  Suggest appropriate recollection if clinically indicated. However now without symptoms, will cont to monitor at this time.

## 2013-06-06 ENCOUNTER — Non-Acute Institutional Stay (SKILLED_NURSING_FACILITY): Payer: Medicare Other | Admitting: Nurse Practitioner

## 2013-06-06 DIAGNOSIS — E039 Hypothyroidism, unspecified: Secondary | ICD-10-CM

## 2013-06-06 DIAGNOSIS — F0150 Vascular dementia without behavioral disturbance: Secondary | ICD-10-CM

## 2013-06-06 DIAGNOSIS — I10 Essential (primary) hypertension: Secondary | ICD-10-CM

## 2013-06-06 DIAGNOSIS — M1990 Unspecified osteoarthritis, unspecified site: Secondary | ICD-10-CM

## 2013-06-06 DIAGNOSIS — F419 Anxiety disorder, unspecified: Secondary | ICD-10-CM

## 2013-06-06 DIAGNOSIS — F411 Generalized anxiety disorder: Secondary | ICD-10-CM

## 2013-06-06 DIAGNOSIS — R634 Abnormal weight loss: Secondary | ICD-10-CM

## 2013-06-06 NOTE — Progress Notes (Signed)
Patient ID: Ebony [name], female   DOB: 12/26/1931, 80 y.o.   MRN: [id number]    Nursing Home Location:  [facility]   Place of Service: SNF (31)  PCP: Harlow Asa, MD  Allergies  Allergen Reactions  . Penicillins Diarrhea  . Ace Inhibitors   . Biaxin [Clarithromycin] Diarrhea  . Sulfa Antibiotics Diarrhea  . Cozaar [Losartan Potassium] Rash    Chief Complaint  Patient presents with  . Medical Managment of Chronic Issues    HPI:  62 yea old female being seen today for routine follow up on chronic conditions. No significant change in status in the last month, pt request to be seen by therapy due back and knee pan but otherwise has no complaints and staff without concerns   Review of Systems:  Review of Systems  Constitutional: Negative for fever, chills and malaise/fatigue.  Respiratory: Negative for cough, sputum production and shortness of breath.   Cardiovascular: Negative for chest pain, palpitations and leg swelling.  Gastrointestinal: Negative for heartburn, nausea, vomiting, abdominal pain, diarrhea and blood in stool.  Genitourinary: Negative for dysuria, urgency, frequency and flank pain.  Musculoskeletal: Positive for back pain and joint pain.  Skin: Negative.   Neurological: Negative for dizziness, sensory change, focal weakness, weakness and headaches.  Psychiatric/Behavioral: Positive for memory loss. Negative for depression and hallucinations. The patient is nervous/anxious (stable). The patient does not have insomnia.      Past Medical History  Diagnosis Date  . Hypertension   . Subarachnoid hemorrhage following injury 05/16/2011  . Unspecified hypothyroidism 04/15/2012  . SDH (subdural hematoma) 05/16/2011  . Hiatal hernia   . Allergic rhinitis   . Fibrocystic breast disease   . Anxiety   . IFG (impaired fasting glucose)   . Arthritis     Knees  . Hyperlipidemia    Past Surgical History  Procedure Laterality Date  . Abdominal  hysterectomy    . Breast surgery    . Tonsillectomy    . Eye surgery     Social History:   reports that she has never smoked. She does not have any smokeless tobacco history on file. She reports that she does not drink alcohol or use illicit drugs.  Family History  Problem Relation Age of Onset  . CVA Neg Hx   . Diabetes Neg Hx   . Hypertension Neg Hx   . Lung cancer Sister     Medications: Patient's Medications  New Prescriptions   No medications on file  Previous Medications   ACETAMINOPHEN (TYLENOL) 325 MG TABLET    Take 2 tablets (650 mg total) by mouth every 6 (six) hours as needed.   ALPRAZOLAM (XANAX) 0.5 MG TABLET    Take 1 tablet (0.5 mg total) by mouth at bedtime. For anxiety   ASPIRIN 81 MG CHEWABLE TABLET    Chew 81 mg by mouth daily.   CETIRIZINE (ZYRTEC) 10 MG TABLET    Take 10 mg by mouth daily.   CRANBERRY 425 MG CAPS    Take by mouth daily.   DILTIAZEM (CARDIZEM CD) 120 MG 24 HR CAPSULE    Take 120 mg by mouth daily.   DONEPEZIL (ARICEPT) 5 MG TABLET    Take 10 mg by mouth at bedtime as needed.    DULOXETINE (CYMBALTA) 30 MG CAPSULE    Take 30 mg by mouth daily.   LEVOTHYROXINE (SYNTHROID, LEVOTHROID) 25 MCG TABLET    Take 1 tablet (25 mcg total) by mouth daily  before breakfast.   MEMANTINE (NAMENDA) 10 MG TABLET    Take 10 mg by mouth 2 (two) times daily.   METOPROLOL (TOPROL-XL) 100 MG 24 HR TABLET    Take 100 mg by mouth every morning.     MIRTAZAPINE (REMERON) 15 MG TABLET    Take 15 mg by mouth at bedtime.   MULTIPLE VITAMIN (MULITIVITAMIN WITH MINERALS) TABS    Take 1 tablet by mouth every morning.   NAPROXEN SODIUM (ANAPROX) 220 MG TABLET    Take 220 mg by mouth 3 (three) times daily as needed.   TRIAMTERENE-HYDROCHLOROTHIAZIDE (MAXZIDE-25) 37.5-25 MG PER TABLET    Take 1 tablet by mouth daily.  Modified Medications   No medications on file  Discontinued Medications   No medications on file     Physical Exam:  Filed Vitals:   06/06/13 1504  BP:  120/76  Pulse: 80  Temp: 97 F (36.1 C)  Resp: 20    Physical Exam  Nursing note and vitals reviewed. Constitutional: She is oriented to person, place, and time and well-developed, well-nourished, and in no distress.  HENT:  Head: Normocephalic and atraumatic.  Neck: Normal range of motion. Neck supple.  Cardiovascular: Normal rate, regular rhythm and normal heart sounds.   Pulmonary/Chest: Effort normal and breath sounds normal. No respiratory distress. She has no wheezes. She exhibits no tenderness.  Abdominal: Soft. Bowel sounds are normal. She exhibits no distension and no mass. There is no tenderness.  Musculoskeletal: Normal range of motion. She exhibits no edema and no tenderness.  Neurological: She is alert and oriented to person, place, and time. Coordination normal.  Skin: Skin is warm and dry. No rash noted. No erythema. There is pallor (chronic).  Psychiatric: Affect and judgment normal.     Labs reviewed: Basic Metabolic Panel  Result: 11/08/2012 4:06 PM ( Status: F )  Sodium 140 135-145 mEq/L SLN  Potassium 3.7 3.5-5.3 mEq/L SLN  Chloride 105 96-112 mEq/L SLN  CO2 28 19-32 mEq/L SLN  Glucose 86 70-99 mg/dL SLN  BUN 16 1-61 mg/dL SLN  Creatinine 0.96 0.45-4.09 mg/dL SLN  Calcium 9.7 8.1-19.1 mg/dL SLN  TSH, Ultrasensitive  Result: 11/08/2012 5:22 PM ( Status: F )  TSH 2.025 0.350-4.500 uIU/mL SLN  Basic Metabolic Panel  Result: 01/08/2013 4:44 PM ( Status: F )  Sodium 138 135-145 mEq/L SLN  Potassium 4.7 3.5-5.3 mEq/L SLN  Chloride 104 96-112 mEq/L SLN  CO2 28 19-32 mEq/L SLN  Glucose 103 H 70-99 mg/dL SLN  BUN 24 H 4-78 mg/dL SLN  Creatinine 2.95 6.21-3.08 mg/dL SLN  Calcium 9.3 6.5-78.4 mg/dL SLN  TSH, Ultrasensitive  Result: 01/08/2013 5:10 PM ( Status: F )  TSH 2.069  CBC NO Diff (Complete Blood Count)  Result: 04/05/2013 12:15 PM ( Status: F ) C  WBC 12.6 H 4.0-10.5 K/uL SLN  RBC 5.03 3.87-5.11 MIL/uL SLN  Hemoglobin 14.6 12.0-15.0 g/dL SLN  Hematocrit  69.6 29.5-28.4 % SLN  MCV 85.3 78.0-100.0 fL SLN  MCH 29.0 26.0-34.0 pg SLN  MCHC 34.0 30.0-36.0 g/dL SLN  RDW 13.2 44.0-10.2 % SLN  Platelet Count 296 150-400 K/uL SLN  Lipid Profile  Result: 04/05/2013 12:06 PM ( Status: F )  Cholesterol 163 0-200 mg/dL SLN C  Triglyceride 725 H <150 mg/dL SLN  HDL Cholesterol 53 >39 mg/dL SLN  Total Chol/HDL Ratio 3.1 Ratio SLN  VLDL Cholesterol (Calc) 35 3-66 mg/dL SLN  LDL Cholesterol (Calc) 75  SH, Ultrasensitive  Result: 04/08/2013 7:24 PM (  Status: F ) C  TSH 1.481 0.350-4.500 UIU/mL    Assessment/Plan 1. HTN (hypertension) -controlled on current medications, will follow up bmp  2. Unspecified hypothyroidism -synthroid 25 mcgs; TSH stable in feb at 1.48  3. Vascular dementia without behavioral disturbance -without changes in the last month, conts aricept and namanda   4. Anxiety disorder -stable on cymbalta  5. Loss of weight -currently on Remeron and supplements, with positive gain, will cont current medications at this time  6. OA (osteoarthritis) -with ongoing pain in back and right knee, will order PT to evaluate and treat at this time

## 2013-07-04 ENCOUNTER — Non-Acute Institutional Stay (SKILLED_NURSING_FACILITY): Payer: Medicare Other | Admitting: Nurse Practitioner

## 2013-07-04 DIAGNOSIS — E039 Hypothyroidism, unspecified: Secondary | ICD-10-CM

## 2013-07-04 DIAGNOSIS — M1990 Unspecified osteoarthritis, unspecified site: Secondary | ICD-10-CM

## 2013-07-04 DIAGNOSIS — F0150 Vascular dementia without behavioral disturbance: Secondary | ICD-10-CM

## 2013-07-04 DIAGNOSIS — I10 Essential (primary) hypertension: Secondary | ICD-10-CM

## 2013-07-04 DIAGNOSIS — F419 Anxiety disorder, unspecified: Secondary | ICD-10-CM

## 2013-07-04 DIAGNOSIS — R634 Abnormal weight loss: Secondary | ICD-10-CM

## 2013-07-04 DIAGNOSIS — F411 Generalized anxiety disorder: Secondary | ICD-10-CM

## 2013-07-04 NOTE — Progress Notes (Signed)
Patient ID: Ebony [name], female   DOB: 1931-12-06, 80 y.o.   MRN: [id number]    Nursing Home [facility]  [facility]   Place of Service: SNF (31)  PCP: Harlow AsaLUKING,W S, MD  Allergies  Allergen Reactions  . Penicillins Diarrhea  . Ace Inhibitors   . Biaxin [Clarithromycin] Diarrhea  . Sulfa Antibiotics Diarrhea  . Cozaar [Losartan Potassium] Rash    Chief Complaint  Patient presents with  . Medical Management of Chronic Issues    HPI:  [age] year old female being seen today for routine follow up on chronic conditions. Pt with pmh of  HTN, hypothyroid, TIA, anxiety, OA, hyperlipidemia,weight loss.  Pt has been seen by PT in the last month and she reports improvement in back and knee pain. Pt notes mole that it itching, reports she has had it several months, otherwise without complaints today reports everything is going well. Staff has no concerns.  Review of Systems:  Review of Systems  Constitutional: Negative for fever, chills and malaise/fatigue.  Respiratory: Negative for cough, sputum production and shortness of breath.   Cardiovascular: Negative for chest pain, palpitations and leg swelling.  Gastrointestinal: Negative for heartburn, nausea, vomiting, abdominal pain, diarrhea and blood in stool.  Genitourinary: Negative for dysuria, urgency, frequency and flank pain.  Musculoskeletal: Positive for back pain and joint pain.       Back and knee pain has improved  Skin: Negative.        Notes mole on chest that is bothering her  Neurological: Negative for dizziness, sensory change, focal weakness, weakness and headaches.  Psychiatric/Behavioral: Negative for depression. The patient is not nervous/anxious and does not have insomnia.      Past Medical History  Diagnosis Date  . Hypertension   . Subarachnoid hemorrhage following injury 05/16/2011  . Unspecified hypothyroidism 04/15/2012  . SDH (subdural hematoma) 05/16/2011  . Hiatal hernia   . Allergic rhinitis    . Fibrocystic breast disease   . Anxiety   . IFG (impaired fasting glucose)   . Arthritis     Knees  . Hyperlipidemia    Past Surgical History  Procedure Laterality Date  . Abdominal hysterectomy    . Breast surgery    . Tonsillectomy    . Eye surgery     Social History:   reports that she has never smoked. She does not have any smokeless tobacco history on file. She reports that she does not drink alcohol or use illicit drugs.  Family History  Problem Relation Age of Onset  . CVA Neg Hx   . Diabetes Neg Hx   . Hypertension Neg Hx   . Lung cancer Sister     Medications: Patient's Medications  New Prescriptions   No medications on file  Previous Medications   ACETAMINOPHEN (TYLENOL) 325 MG TABLET    Take 2 tablets (650 mg total) by mouth every 6 (six) hours as needed.   ALPRAZOLAM (XANAX) 0.5 MG TABLET    Take 1 tablet (0.5 mg total) by mouth at bedtime. For anxiety   ASPIRIN 81 MG CHEWABLE TABLET    Chew 81 mg by mouth daily.   CETIRIZINE (ZYRTEC) 10 MG TABLET    Take 10 mg by mouth daily.   CRANBERRY 425 MG CAPS    Take by mouth daily.   DILTIAZEM (CARDIZEM CD) 120 MG 24 HR CAPSULE    Take 120 mg by mouth daily.   DONEPEZIL (ARICEPT) 5 MG TABLET  Take 10 mg by mouth at bedtime as needed.    DULOXETINE (CYMBALTA) 30 MG CAPSULE    Take 30 mg by mouth daily.   LEVOTHYROXINE (SYNTHROID, LEVOTHROID) 25 MCG TABLET    Take 1 tablet (25 mcg total) by mouth daily before breakfast.   MEMANTINE (NAMENDA) 10 MG TABLET    Take 10 mg by mouth 2 (two) times daily.   METOPROLOL (TOPROL-XL) 100 MG 24 HR TABLET    Take 100 mg by mouth every morning.     MIRTAZAPINE (REMERON) 15 MG TABLET    Take 15 mg by mouth at bedtime.   MULTIPLE VITAMIN (MULITIVITAMIN WITH MINERALS) TABS    Take 1 tablet by mouth every morning.   NAPROXEN SODIUM (ANAPROX) 220 MG TABLET    Take 220 mg by mouth 3 (three) times daily as needed.   TRIAMTERENE-HYDROCHLOROTHIAZIDE (MAXZIDE-25) 37.5-25 MG PER TABLET     Take 1 tablet by mouth daily.  Modified Medications   No medications on file  Discontinued Medications   No medications on file     Physical Exam:  Filed Vitals:   07/04/13 1320  BP: 134/87  Pulse: 60  Temp: 98.8 F (37.1 C)  Resp: 18  Weight: 131 lb (59.421 kg)    Physical Exam  Constitutional: She is oriented to person, place, and time and well-developed, well-nourished, and in no distress.  HENT:  Head: Normocephalic and atraumatic.  Neck: Normal range of motion. Neck supple.  Cardiovascular: Normal rate, regular rhythm and normal heart sounds.   Pulmonary/Chest: Effort normal and breath sounds normal. No respiratory distress.  Abdominal: Soft. Bowel sounds are normal. She exhibits no distension and no mass. There is no tenderness.  Musculoskeletal: Normal range of motion. She exhibits no edema and no tenderness.  Neurological: She is alert and oriented to person, place, and time. Coordination normal.  Skin: Skin is warm and dry.  Small raised mole with 2 black areas   Psychiatric: Affect and judgment normal.     Labs reviewed: Basic Metabolic Panel  Result: 11/08/2012 4:06 PM ( Status: F )  Sodium 140 135-145 mEq/L SLN  Potassium 3.7 3.5-5.3 mEq/L SLN  Chloride 105 96-112 mEq/L SLN  CO2 28 19-32 mEq/L SLN  Glucose 86 70-99 mg/dL SLN  BUN 16 1-19 mg/dL SLN  Creatinine 1.47 8.29-5.62 mg/dL SLN  Calcium 9.7 1.3-08.6 mg/dL SLN  TSH, Ultrasensitive  Result: 11/08/2012 5:22 PM ( Status: F )  TSH 2.025 0.350-4.500 uIU/mL SLN  Basic Metabolic Panel  Result: 01/08/2013 4:44 PM ( Status: F )  Sodium 138 135-145 mEq/L SLN  Potassium 4.7 3.5-5.3 mEq/L SLN  Chloride 104 96-112 mEq/L SLN  CO2 28 19-32 mEq/L SLN  Glucose 103 H 70-99 mg/dL SLN  BUN 24 H 5-78 mg/dL SLN  Creatinine 4.69 6.29-5.28 mg/dL SLN  Calcium 9.3 4.1-32.4 mg/dL SLN  TSH, Ultrasensitive  Result: 01/08/2013 5:10 PM ( Status: F )  TSH 2.069  CBC NO Diff (Complete Blood Count)  Result: 04/05/2013 12:15 PM  ( Status: F ) C  WBC 12.6 H 4.0-10.5 K/uL SLN  RBC 5.03 3.87-5.11 MIL/uL SLN  Hemoglobin 14.6 12.0-15.0 g/dL SLN  Hematocrit 40.1 02.7-25.3 % SLN  MCV 85.3 78.0-100.0 fL SLN  MCH 29.0 26.0-34.0 pg SLN  MCHC 34.0 30.0-36.0 g/dL SLN  RDW 66.4 40.3-47.4 % SLN  Platelet Count 296 150-400 K/uL SLN  Lipid Profile  Result: 04/05/2013 12:06 PM ( Status: F )  Cholesterol 163 0-200 mg/dL SLN C  Triglyceride 259 H <  150 mg/dL SLN  HDL Cholesterol 53 >39 mg/dL SLN  Total Chol/HDL Ratio 3.1 Ratio SLN  VLDL Cholesterol (Calc) 35 1-610-40 mg/dL SLN  LDL Cholesterol (Calc) 75  SH, Ultrasensitive  Result: 04/08/2013 7:24 PM ( Status: F ) C  TSH 1.481 0.350-4.500 UIU/mL   Comprehensive Metabolic Panel    Result: 06/07/2013 5:01 PM   ( Status: F )     C Sodium 138     135-145 mEq/L SLN   Potassium 3.5     3.5-5.3 mEq/L SLN   Chloride 103     96-112 mEq/L SLN   CO2 25     19-32 mEq/L SLN   Glucose 90     70-99 mg/dL SLN   BUN 12     0-966-23 mg/dL SLN   Creatinine 0.450.68     0.50-1.10 mg/dL SLN   Bilirubin, Total 0.4     0.2-1.2 mg/dL SLN   Alkaline Phosphatase 83     39-117 U/L SLN   AST/SGOT 19     0-37 U/L SLN   ALT/SGPT 14     0-35 U/L SLN   Total Protein 5.4   L 6.0-8.3 g/dL SLN   Albumin 3.1   L 4.0-9.83.5-5.2 g/dL SLN   Calcium 8.6     1.1-91.48.4-10.5 mg/dL SLN    Assessment/Plan 1. Anxiety disorder -stable on cymbalta  2. OA (osteoarthritis) -working with PT and reports improvement in pain  3. Vascular dementia without behavioral disturbance -stable at this time, conts on aricept and namanda   4. Unspecified hypothyroidism TSH stable in feb, conts on current dose of synthroid  5. HTN (hypertension) -well controlled at this time  6. Loss of weight -reports decrease appetite but has had positive weight gain -will cont remeron at this time  7. Lesion Lesion appeared several months ago and has not had significant changes per pt however pt reports she it is causing her to itch; Will refer to dermatology,  will most likely need to be removed.

## 2013-07-21 ENCOUNTER — Other Ambulatory Visit: Payer: Self-pay | Admitting: *Deleted

## 2013-07-21 MED ORDER — ALPRAZOLAM 0.5 MG PO TABS: ORAL_TABLET | ORAL | Status: DC

## 2013-07-21 NOTE — Telephone Encounter (Signed)
[facility] 

## 2013-08-01 ENCOUNTER — Non-Acute Institutional Stay: Payer: Medicare Other | Admitting: *Deleted

## 2013-08-01 ENCOUNTER — Non-Acute Institutional Stay (SKILLED_NURSING_FACILITY): Payer: Medicare Other | Admitting: Nurse Practitioner

## 2013-08-01 ENCOUNTER — Encounter: Payer: Self-pay | Admitting: *Deleted

## 2013-08-01 DIAGNOSIS — I10 Essential (primary) hypertension: Secondary | ICD-10-CM

## 2013-08-01 DIAGNOSIS — F419 Anxiety disorder, unspecified: Secondary | ICD-10-CM

## 2013-08-01 DIAGNOSIS — F0150 Vascular dementia without behavioral disturbance: Secondary | ICD-10-CM

## 2013-08-01 DIAGNOSIS — R634 Abnormal weight loss: Secondary | ICD-10-CM

## 2013-08-01 DIAGNOSIS — F411 Generalized anxiety disorder: Secondary | ICD-10-CM

## 2013-08-01 DIAGNOSIS — L989 Disorder of the skin and subcutaneous tissue, unspecified: Secondary | ICD-10-CM

## 2013-08-01 DIAGNOSIS — M1990 Unspecified osteoarthritis, unspecified site: Secondary | ICD-10-CM

## 2013-08-01 DIAGNOSIS — E039 Hypothyroidism, unspecified: Secondary | ICD-10-CM

## 2013-08-01 NOTE — Progress Notes (Signed)
Patient ID: Ebony [name], female   DOB: 07/29/1931, 80 y.o.   MRN: [id number]    Nursing Home Location:  [facility]   Place of Service: SNF (31)  PCP: LUKING,W S, MD  Allergies  Allergen Reactions  . Penicillins Diarrhea  . Ace Inhibitors   . Biaxin [Clarithromycin] Diarrhea  . Sulfa Antibiotics Diarrhea  . Cozaar [Losartan Potassium] Rash    Chief Complaint  Patient presents with  . Medical Management of Chronic Issues    HPI:  80 year old female being seen today for routine follow up on chronic conditions. Pt with pmh of HTN, hypothyroid, TIA, anxiety, OA, hyperlipidemia,weight loss. Pt has been to dentist today due to having crown fitted after filling was done. Pt denies any dental pain at this time but reported she did not eat lunch. Went to dermatology and they cut out mole stating" half was cancer" and had to take more out for good borders but they were successful and now she has sutures, unsure of what cancer it was per pt. Currently pt without complaints and staff does not have concerns at this time.  Review of Systems:  Review of Systems  Constitutional: Negative for fever, chills and malaise/fatigue.  Eyes: Negative for blurred vision.  Respiratory: Negative for cough, sputum production and shortness of breath.   Cardiovascular: Negative for chest pain, palpitations and leg swelling.  Gastrointestinal: Negative for heartburn, nausea, vomiting, abdominal pain, diarrhea and blood in stool.  Genitourinary: Negative for dysuria, urgency, frequency and flank pain.  Musculoskeletal: Positive for back pain (at times).  Skin: Negative.   Neurological: Negative for dizziness, sensory change, focal weakness, weakness and headaches.  Psychiatric/Behavioral: Negative for depression and hallucinations. The patient is nervous/anxious (stable). The patient does not have insomnia.      Past Medical History  Diagnosis Date  . Hypertension   . Subarachnoid  hemorrhage following injury 05/16/2011  . Unspecified hypothyroidism 04/15/2012  . SDH (subdural hematoma) 05/16/2011  . Hiatal hernia   . Allergic rhinitis   . Fibrocystic breast disease   . Anxiety   . IFG (impaired fasting glucose)   . Arthritis     Knees  . Hyperlipidemia    Past Surgical History  Procedure Laterality Date  . Abdominal hysterectomy    . Breast surgery    . Tonsillectomy    . Eye surgery     Social History:   reports that she has never smoked. She does not have any smokeless tobacco history on file. She reports that she does not drink alcohol or use illicit drugs.  Family History  Problem Relation Age of Onset  . CVA Neg Hx   . Diabetes Neg Hx   . Hypertension Neg Hx   . Lung cancer Sister     Medications: Patient's Medications  New Prescriptions   No medications on file  Previous Medications   ACETAMINOPHEN (TYLENOL) 325 MG TABLET    Take 2 tablets (650 mg total) by mouth every 6 (six) hours as needed.   ALPRAZOLAM (XANAX) 0.5 MG TABLET    Take one tablet by mouth at bedtime for anxiety   ASPIRIN 81 MG CHEWABLE TABLET    Chew 81 mg by mouth daily.   CETIRIZINE (ZYRTEC) 10 MG TABLET    Take 10 mg by mouth daily.   CRANBERRY 425 MG CAPS    Take by mouth daily.   DILTIAZEM (CARDIZEM CD) 120 MG 24 HR CAPSULE    Take 120 mg   by mouth daily. for angina.   DOCUSATE SODIUM (COLACE) 100 MG CAPSULE    Take 100 mg by mouth daily. for  Constipation.   DONEPEZIL (ARICEPT) 5 MG TABLET    Take 10 mg by mouth at bedtime as needed. For memory   DULOXETINE (CYMBALTA) 30 MG CAPSULE    Take 30 mg by mouth daily. for depression.   LEVOTHYROXINE (SYNTHROID, LEVOTHROID) 25 MCG TABLET    Take 1 tablet (25 mcg total) by mouth daily before breakfast.   MEMANTINE (NAMENDA) 10 MG TABLET    Take 10 mg by mouth 2 (two) times daily.   METOPROLOL (TOPROL-XL) 100 MG 24 HR TABLET    Take 100 mg by mouth every morning. for HTN.   MIRTAZAPINE (REMERON) 15 MG TABLET    Take 15 mg by mouth  at bedtime.   MULTIPLE VITAMIN (MULITIVITAMIN WITH MINERALS) TABS    Take 1 tablet by mouth every morning.   NAPROXEN SODIUM (ANAPROX) 220 MG TABLET    Take 220 mg by mouth 3 (three) times daily as needed. For pain   PHENAZOPYRIDINE (PYRIDIUM) 100 MG TABLET    Take 100 mg by mouth 3 (three) times daily as needed. for burning on urination or pain.   TRIAMTERENE-HYDROCHLOROTHIAZIDE (MAXZIDE-25) 37.5-25 MG PER TABLET    Take 1 tablet by mouth daily.  Modified Medications   No medications on file  Discontinued Medications   No medications on file     Physical Exam:  Filed Vitals:   08/01/13 0954  BP: 131/78  Pulse: 65  Temp: 97.5 F (36.4 C)  Resp: 20  Weight: 130 lb (58.968 kg)    Physical Exam  Nursing note and vitals reviewed. Constitutional: She is oriented to person, place, and time and well-developed, well-nourished, and in no distress.  HENT:  Head: Normocephalic and atraumatic.  Mouth/Throat: Oropharynx is clear and moist. No oropharyngeal exudate.  Eyes: Conjunctivae and EOM are normal. Pupils are equal, round, and reactive to light.  Neck: Normal range of motion. Neck supple.  Cardiovascular: Normal rate, regular rhythm and normal heart sounds.   Pulmonary/Chest: Effort normal and breath sounds normal.  Abdominal: Soft. Bowel sounds are normal. She exhibits no distension. There is no tenderness.  Musculoskeletal: Normal range of motion. She exhibits no edema and no tenderness.  Neurological: She is alert and oriented to person, place, and time. Coordination normal.  Skin: Skin is warm and dry. No rash noted. No erythema.  Psychiatric: Affect and judgment normal.     Labs reviewed: Basic Metabolic Panel  Result: 11/08/2012 4:06 PM ( Status: F )  Sodium 140 135-145 mEq/L SLN  Potassium 3.7 3.5-5.3 mEq/L SLN  Chloride 105 96-112 mEq/L SLN  CO2 28 19-32 mEq/L SLN  Glucose 86 70-99 mg/dL SLN  BUN 16 6-23 mg/dL SLN  Creatinine 0.66 0.50-1.10 mg/dL SLN  Calcium 9.7  8.4-10.5 mg/dL SLN  TSH, Ultrasensitive  Result: 11/08/2012 5:22 PM ( Status: F )  TSH 2.025 0.350-4.500 uIU/mL SLN  Basic Metabolic Panel  Result: 01/08/2013 4:44 PM ( Status: F )  Sodium 138 135-145 mEq/L SLN  Potassium 4.7 3.5-5.3 mEq/L SLN  Chloride 104 96-112 mEq/L SLN  CO2 28 19-32 mEq/L SLN  Glucose 103 H 70-99 mg/dL SLN  BUN 24 H 6-23 mg/dL SLN  Creatinine 0.68 0.50-1.10 mg/dL SLN  Calcium 9.3 8.4-10.5 mg/dL SLN  TSH, Ultrasensitive  Result: 01/08/2013 5:10 PM ( Status: F )  TSH 2.069  CBC NO Diff (Complete Blood Count)  Result:   04/05/2013 12:15 PM ( Status: F ) C  WBC 12.6 H 4.0-10.5 K/uL SLN  RBC 5.03 3.87-5.11 MIL/uL SLN  Hemoglobin 14.6 12.0-15.0 g/dL SLN  Hematocrit 42.9 36.0-46.0 % SLN  MCV 85.3 78.0-100.0 fL SLN  MCH 29.0 26.0-34.0 pg SLN  MCHC 34.0 30.0-36.0 g/dL SLN  RDW 14.7 11.5-15.5 % SLN  Platelet Count 296 150-400 K/uL SLN  Lipid Profile  Result: 04/05/2013 12:06 PM ( Status: F )  Cholesterol 163 0-200 mg/dL SLN C  Triglyceride 176 H <150 mg/dL SLN  HDL Cholesterol 53 >39 mg/dL SLN  Total Chol/HDL Ratio 3.1 Ratio SLN  VLDL Cholesterol (Calc) 35 0-40 mg/dL SLN  LDL Cholesterol (Calc) 75  SH, Ultrasensitive  Result: 04/08/2013 7:24 PM ( Status: F ) C  TSH 1.481 0.350-4.500 UIU/mL   Comprehensive Metabolic Panel  Result: 06/07/2013 5:01 PM ( Status: F ) C  Sodium 138 135-145 mEq/L SLN  Potassium 3.5 3.5-5.3 mEq/L SLN  Chloride 103 96-112 mEq/L SLN  CO2 25 19-32 mEq/L SLN  Glucose 90 70-99 mg/dL SLN  BUN 12 6-23 mg/dL SLN  Creatinine 0.68 0.50-1.10 mg/dL SLN  Bilirubin, Total 0.4 0.2-1.2 mg/dL SLN  Alkaline Phosphatase 83 39-117 U/L SLN  AST/SGOT 19 0-37 U/L SLN  ALT/SGPT 14 0-35 U/L SLN  Total Protein 5.4 L 6.0-8.3 g/dL SLN  Albumin 3.1 L 3.5-5.2 g/dL SLN  Calcium 8.6 8.4-10.5 mg/dL SLN    Assessment/Plan 1. HTN (hypertension) Stable on current medications  2. Unspecified hypothyroidism -remains on synthroid   3. Vascular dementia without  behavioral disturbance -without significant cognitive changes   4. OA (osteoarthritis) Stable on current as needed medications, no worsening of pain conts on naproxen, BUN, Cr being monitoring, will follow up cbc with next month labs  5. Anxiety disorder -some increase in anxiety/depressin due to daughter being sick   6. Loss of weight -has remained stable on remeron and supplements   7. Skin lesion -removed by dermatology with sutures and nursing with instructions to DC    

## 2013-08-01 NOTE — Progress Notes (Signed)
Patient ID: Ebony [name], female   DOB: 04-Jul-1931, 80 y.o.   MRN: [id number]    Nursing Home Location:  [facility]   Place of Service: SNF (31)  PCP: Harlow AsaLUKING,W S, MD  Allergies  Allergen Reactions  . Penicillins Diarrhea  . Ace Inhibitors   . Biaxin [Clarithromycin] Diarrhea  . Sulfa Antibiotics Diarrhea  . Cozaar [Losartan Potassium] Rash    Chief Complaint  Patient presents with  . Medical Management of Chronic Issues    HPI:  [age] year old female being seen today for routine follow up on chronic conditions. Pt with pmh of HTN, hypothyroid, TIA, anxiety, OA, hyperlipidemia,weight loss. Pt has been to dentist today due to having crown fitted after filling was done. Pt denies any dental pain at this time but reported she did not eat lunch. Went to dermatology and they cut out mole stating" half was cancer" and had to take more out for good borders but they were successful and now she has sutures, unsure of what cancer it was per pt. Currently pt without complaints and staff does not have concerns at this time.  Review of Systems:  Review of Systems  Constitutional: Negative for fever, chills and malaise/fatigue.  Eyes: Negative for blurred vision.  Respiratory: Negative for cough, sputum production and shortness of breath.   Cardiovascular: Negative for chest pain, palpitations and leg swelling.  Gastrointestinal: Negative for heartburn, nausea, vomiting, abdominal pain, diarrhea and blood in stool.  Genitourinary: Negative for dysuria, urgency, frequency and flank pain.  Musculoskeletal: Positive for back pain (at times).  Skin: Negative.   Neurological: Negative for dizziness, sensory change, focal weakness, weakness and headaches.  Psychiatric/Behavioral: Negative for depression and hallucinations. The patient is nervous/anxious (stable). The patient does not have insomnia.      Past Medical History  Diagnosis Date  . Hypertension   . Subarachnoid  hemorrhage following injury 05/16/2011  . Unspecified hypothyroidism 04/15/2012  . SDH (subdural hematoma) 05/16/2011  . Hiatal hernia   . Allergic rhinitis   . Fibrocystic breast disease   . Anxiety   . IFG (impaired fasting glucose)   . Arthritis     Knees  . Hyperlipidemia    Past Surgical History  Procedure Laterality Date  . Abdominal hysterectomy    . Breast surgery    . Tonsillectomy    . Eye surgery     Social History:   reports that she has never smoked. She does not have any smokeless tobacco history on file. She reports that she does not drink alcohol or use illicit drugs.  Family History  Problem Relation Age of Onset  . CVA Neg Hx   . Diabetes Neg Hx   . Hypertension Neg Hx   . Lung cancer Sister     Medications: Patient's Medications  New Prescriptions   No medications on file  Previous Medications   ACETAMINOPHEN (TYLENOL) 325 MG TABLET    Take 2 tablets (650 mg total) by mouth every 6 (six) hours as needed.   ALPRAZOLAM (XANAX) 0.5 MG TABLET    Take one tablet by mouth at bedtime for anxiety   ASPIRIN 81 MG CHEWABLE TABLET    Chew 81 mg by mouth daily.   CETIRIZINE (ZYRTEC) 10 MG TABLET    Take 10 mg by mouth daily.   CRANBERRY 425 MG CAPS    Take by mouth daily.   DILTIAZEM (CARDIZEM CD) 120 MG 24 HR CAPSULE    Take 120 mg  by mouth daily. for angina.   DOCUSATE SODIUM (COLACE) 100 MG CAPSULE    Take 100 mg by mouth daily. for  Constipation.   DONEPEZIL (ARICEPT) 5 MG TABLET    Take 10 mg by mouth at bedtime as needed. For memory   DULOXETINE (CYMBALTA) 30 MG CAPSULE    Take 30 mg by mouth daily. for depression.   LEVOTHYROXINE (SYNTHROID, LEVOTHROID) 25 MCG TABLET    Take 1 tablet (25 mcg total) by mouth daily before breakfast.   MEMANTINE (NAMENDA) 10 MG TABLET    Take 10 mg by mouth 2 (two) times daily.   METOPROLOL (TOPROL-XL) 100 MG 24 HR TABLET    Take 100 mg by mouth every morning. for HTN.   MIRTAZAPINE (REMERON) 15 MG TABLET    Take 15 mg by mouth  at bedtime.   MULTIPLE VITAMIN (MULITIVITAMIN WITH MINERALS) TABS    Take 1 tablet by mouth every morning.   NAPROXEN SODIUM (ANAPROX) 220 MG TABLET    Take 220 mg by mouth 3 (three) times daily as needed. For pain   PHENAZOPYRIDINE (PYRIDIUM) 100 MG TABLET    Take 100 mg by mouth 3 (three) times daily as needed. for burning on urination or pain.   TRIAMTERENE-HYDROCHLOROTHIAZIDE (MAXZIDE-25) 37.5-25 MG PER TABLET    Take 1 tablet by mouth daily.  Modified Medications   No medications on file  Discontinued Medications   No medications on file     Physical Exam:  Filed Vitals:   08/01/13 0954  BP: 131/78  Pulse: 65  Temp: 97.5 F (36.4 C)  Resp: 20  Weight: 130 lb (58.968 kg)    Physical Exam  Nursing note and vitals reviewed. Constitutional: She is oriented to person, place, and time and well-developed, well-nourished, and in no distress.  HENT:  Head: Normocephalic and atraumatic.  Mouth/Throat: Oropharynx is clear and moist. No oropharyngeal exudate.  Eyes: Conjunctivae and EOM are normal. Pupils are equal, round, and reactive to light.  Neck: Normal range of motion. Neck supple.  Cardiovascular: Normal rate, regular rhythm and normal heart sounds.   Pulmonary/Chest: Effort normal and breath sounds normal.  Abdominal: Soft. Bowel sounds are normal. She exhibits no distension. There is no tenderness.  Musculoskeletal: Normal range of motion. She exhibits no edema and no tenderness.  Neurological: She is alert and oriented to person, place, and time. Coordination normal.  Skin: Skin is warm and dry. No rash noted. No erythema.  Psychiatric: Affect and judgment normal.     Labs reviewed: Basic Metabolic Panel  Result: 11/08/2012 4:06 PM ( Status: F )  Sodium 140 135-145 mEq/L SLN  Potassium 3.7 3.5-5.3 mEq/L SLN  Chloride 105 96-112 mEq/L SLN  CO2 28 19-32 mEq/L SLN  Glucose 86 70-99 mg/dL SLN  BUN 16 7-61 mg/dL SLN  Creatinine 6.07 3.71-0.62 mg/dL SLN  Calcium 9.7  6.9-48.5 mg/dL SLN  TSH, Ultrasensitive  Result: 11/08/2012 5:22 PM ( Status: F )  TSH 2.025 0.350-4.500 uIU/mL SLN  Basic Metabolic Panel  Result: 01/08/2013 4:44 PM ( Status: F )  Sodium 138 135-145 mEq/L SLN  Potassium 4.7 3.5-5.3 mEq/L SLN  Chloride 104 96-112 mEq/L SLN  CO2 28 19-32 mEq/L SLN  Glucose 103 H 70-99 mg/dL SLN  BUN 24 H 4-62 mg/dL SLN  Creatinine 7.03 5.00-9.38 mg/dL SLN  Calcium 9.3 1.8-29.9 mg/dL SLN  TSH, Ultrasensitive  Result: 01/08/2013 5:10 PM ( Status: F )  TSH 2.069  CBC NO Diff (Complete Blood Count)  Result:  04/05/2013 12:15 PM ( Status: F ) C  WBC 12.6 H 4.0-10.5 K/uL SLN  RBC 5.03 3.87-5.11 MIL/uL SLN  Hemoglobin 14.6 12.0-15.0 g/dL SLN  Hematocrit 40.9 81.1-91.4 % SLN  MCV 85.3 78.0-100.0 fL SLN  MCH 29.0 26.0-34.0 pg SLN  MCHC 34.0 30.0-36.0 g/dL SLN  RDW 78.2 95.6-21.3 % SLN  Platelet Count 296 150-400 K/uL SLN  Lipid Profile  Result: 04/05/2013 12:06 PM ( Status: F )  Cholesterol 163 0-200 mg/dL SLN C  Triglyceride 086 H <150 mg/dL SLN  HDL Cholesterol 53 >39 mg/dL SLN  Total Chol/HDL Ratio 3.1 Ratio SLN  VLDL Cholesterol (Calc) 35 5-78 mg/dL SLN  LDL Cholesterol (Calc) 75  SH, Ultrasensitive  Result: 04/08/2013 7:24 PM ( Status: F ) C  TSH 1.481 0.350-4.500 UIU/mL   Comprehensive Metabolic Panel  Result: 06/07/2013 5:01 PM ( Status: F ) C  Sodium 138 135-145 mEq/L SLN  Potassium 3.5 3.5-5.3 mEq/L SLN  Chloride 103 96-112 mEq/L SLN  CO2 25 19-32 mEq/L SLN  Glucose 90 70-99 mg/dL SLN  BUN 12 4-69 mg/dL SLN  Creatinine 6.29 5.28-4.13 mg/dL SLN  Bilirubin, Total 0.4 0.2-1.2 mg/dL SLN  Alkaline Phosphatase 83 39-117 U/L SLN  AST/SGOT 19 0-37 U/L SLN  ALT/SGPT 14 0-35 U/L SLN  Total Protein 5.4 L 6.0-8.3 g/dL SLN  Albumin 3.1 L 2.4-4.0 g/dL SLN  Calcium 8.6 1.0-27.2 mg/dL SLN    Assessment/Plan 1. HTN (hypertension) Stable on current medications  2. Unspecified hypothyroidism -remains on synthroid   3. Vascular dementia without  behavioral disturbance -without significant cognitive changes   4. OA (osteoarthritis) Stable on current as needed medications, no worsening of pain conts on naproxen, BUN, Cr being monitoring, will follow up cbc with next month labs  5. Anxiety disorder -some increase in anxiety/depressin due to daughter being sick   6. Loss of weight -has remained stable on remeron and supplements   7. Skin lesion -removed by dermatology with sutures and nursing with instructions to DC

## 2013-08-04 NOTE — Progress Notes (Unsigned)
This encounter was created in error - please disregard.

## 2013-09-08 ENCOUNTER — Non-Acute Institutional Stay (SKILLED_NURSING_FACILITY): Payer: Medicare Other | Admitting: Nurse Practitioner

## 2013-09-08 DIAGNOSIS — E039 Hypothyroidism, unspecified: Secondary | ICD-10-CM

## 2013-09-08 DIAGNOSIS — F0150 Vascular dementia without behavioral disturbance: Secondary | ICD-10-CM

## 2013-09-08 DIAGNOSIS — F411 Generalized anxiety disorder: Secondary | ICD-10-CM

## 2013-09-08 DIAGNOSIS — I10 Essential (primary) hypertension: Secondary | ICD-10-CM

## 2013-09-08 DIAGNOSIS — R634 Abnormal weight loss: Secondary | ICD-10-CM

## 2013-09-08 DIAGNOSIS — M159 Polyosteoarthritis, unspecified: Secondary | ICD-10-CM

## 2013-09-08 NOTE — Progress Notes (Signed)
Patient ID: Ebony [name], female   DOB: October 22, 1931, 80 y.o.   MRN: [id number]    Nursing Home Location:  [facility]   Place of Service: SNF (31)  PCP: Harlow Asa, MD  Allergies  Allergen Reactions  . Penicillins Diarrhea  . Ace Inhibitors   . Biaxin [Clarithromycin] Diarrhea  . Sulfa Antibiotics Diarrhea  . Cozaar [Losartan Potassium] Rash    Chief Complaint  Patient presents with  . Medical Management of Chronic Issues    HPI:  80 year old female being seen today for routine follow up on chronic conditions. Pt with pmh of HTN, hypothyroid, TIA, anxiety, OA, hyperlipidemia,weight loss. Pt with weight gain of 3 lb in last month. Reports good appetite. Does not want to gain too much weight because she has been overweight in the past. Back pain comes and goes but overall has been stable. Reports mood has been good. Unsure if there is any changes to urinary frequency or burning with urination that comes and goes chronically   Review of Systems:  Review of Systems  Constitutional: Negative for fever, chills and malaise/fatigue.  HENT: Negative for sore throat.   Eyes: Negative for blurred vision.  Respiratory: Negative for cough, sputum production and shortness of breath.   Cardiovascular: Negative for chest pain, palpitations and leg swelling.  Gastrointestinal: Negative for heartburn, abdominal pain, diarrhea and blood in stool.  Genitourinary: Negative for dysuria, urgency and frequency.  Musculoskeletal: Negative for myalgias. Back pain: at times.  Skin: Negative.   Neurological: Negative for dizziness, sensory change, focal weakness, weakness and headaches.  Psychiatric/Behavioral: Negative for depression. The patient is not nervous/anxious and does not have insomnia.      Past Medical History  Diagnosis Date  . Hypertension   . Subarachnoid hemorrhage following injury 05/16/2011  . Unspecified hypothyroidism 04/15/2012  . SDH (subdural hematoma)  05/16/2011  . Hiatal hernia   . Allergic rhinitis   . Fibrocystic breast disease   . Anxiety   . IFG (impaired fasting glucose)   . Arthritis     Knees  . Hyperlipidemia    Past Surgical History  Procedure Laterality Date  . Abdominal hysterectomy    . Breast surgery    . Tonsillectomy    . Eye surgery     Social History:   reports that she has never smoked. She does not have any smokeless tobacco history on file. She reports that she does not drink alcohol or use illicit drugs.  Family History  Problem Relation Age of Onset  . CVA Neg Hx   . Diabetes Neg Hx   . Hypertension Neg Hx   . Lung cancer Sister     Medications: Patient's Medications  New Prescriptions   No medications on file  Previous Medications   ACETAMINOPHEN (TYLENOL) 325 MG TABLET    Take 2 tablets (650 mg total) by mouth every 6 (six) hours as needed.   ALPRAZOLAM (XANAX) 0.5 MG TABLET    Take one tablet by mouth at bedtime for anxiety   ASPIRIN 81 MG CHEWABLE TABLET    Chew 81 mg by mouth daily.   CETIRIZINE (ZYRTEC) 10 MG TABLET    Take 10 mg by mouth daily.   CRANBERRY 425 MG CAPS    Take by mouth daily.   DILTIAZEM (CARDIZEM CD) 120 MG 24 HR CAPSULE    Take 120 mg by mouth daily. for angina.   DOCUSATE SODIUM (COLACE) 100 MG CAPSULE    Take  100 mg by mouth daily. for  Constipation.   DONEPEZIL (ARICEPT) 5 MG TABLET    Take 10 mg by mouth at bedtime as needed. For memory   DULOXETINE (CYMBALTA) 30 MG CAPSULE    Take 30 mg by mouth daily. for depression.   LEVOTHYROXINE (SYNTHROID, LEVOTHROID) 25 MCG TABLET    Take 1 tablet (25 mcg total) by mouth daily before breakfast.   MEMANTINE (NAMENDA) 10 MG TABLET    Take 10 mg by mouth 2 (two) times daily.   METOPROLOL (TOPROL-XL) 100 MG 24 HR TABLET    Take 100 mg by mouth every morning. for HTN.   MIRTAZAPINE (REMERON) 15 MG TABLET    Take 15 mg by mouth at bedtime.   MULTIPLE VITAMIN (MULITIVITAMIN WITH MINERALS) TABS    Take 1 tablet by mouth every morning.    NAPROXEN SODIUM (ANAPROX) 220 MG TABLET    Take 220 mg by mouth 3 (three) times daily as needed. For pain   PHENAZOPYRIDINE (PYRIDIUM) 100 MG TABLET    Take 100 mg by mouth 3 (three) times daily as needed. for burning on urination or pain.  Modified Medications   No medications on file  Discontinued Medications   No medications on file     Physical Exam:  Filed Vitals:   09/08/13 1805  BP: 128/60  Pulse: 76  Temp: 98.4 F (36.9 C)  Resp: 20  Weight: 133 lb (60.328 kg)   Physical Exam  Nursing note and vitals reviewed. Constitutional: She is oriented to person, place, and time and well-developed, well-nourished, and in no distress.  HENT:  Head: Normocephalic and atraumatic.  Mouth/Throat: Oropharynx is clear and moist. No oropharyngeal exudate.  Eyes: Conjunctivae and EOM are normal. Pupils are equal, round, and reactive to light.  Neck: Normal range of motion. Neck supple.  Cardiovascular: Normal rate, regular rhythm and normal heart sounds.   Pulmonary/Chest: Effort normal and breath sounds normal.  Abdominal: Soft. Bowel sounds are normal. She exhibits no distension. There is no tenderness.  Musculoskeletal: Normal range of motion. She exhibits no edema and no tenderness.  Neurological: She is alert and oriented to person, place, and time. Coordination normal.  Skin: Skin is warm and dry.  Psychiatric: Affect normal.     Labs reviewed:  Basic Metabolic Panel  Result: 11/08/2012 4:06 PM ( Status: F )  Sodium 140 135-145 mEq/L SLN  Potassium 3.7 3.5-5.3 mEq/L SLN  Chloride 105 96-112 mEq/L SLN  CO2 28 19-32 mEq/L SLN  Glucose 86 70-99 mg/dL SLN  BUN 16 4-09 mg/dL SLN  Creatinine 8.11 9.14-7.82 mg/dL SLN  Calcium 9.7 9.5-62.1 mg/dL SLN  TSH, Ultrasensitive  Result: 11/08/2012 5:22 PM ( Status: F )  TSH 2.025 0.350-4.500 uIU/mL SLN  Basic Metabolic Panel  Result: 01/08/2013 4:44 PM ( Status: F )  Sodium 138 135-145 mEq/L SLN  Potassium 4.7 3.5-5.3 mEq/L SLN    Chloride 104 96-112 mEq/L SLN  CO2 28 19-32 mEq/L SLN  Glucose 103 H 70-99 mg/dL SLN  BUN 24 H 3-08 mg/dL SLN  Creatinine 6.57 8.46-9.62 mg/dL SLN  Calcium 9.3 9.5-28.4 mg/dL SLN  TSH, Ultrasensitive  Result: 01/08/2013 5:10 PM ( Status: F )  TSH 2.069  CBC NO Diff (Complete Blood Count)  Result: 04/05/2013 12:15 PM ( Status: F ) C  WBC 12.6 H 4.0-10.5 K/uL SLN  RBC 5.03 3.87-5.11 MIL/uL SLN  Hemoglobin 14.6 12.0-15.0 g/dL SLN  Hematocrit 13.2 44.0-10.2 % SLN  MCV 85.3 78.0-100.0 fL SLN  MCH 29.0 26.0-34.0 pg SLN  MCHC 34.0 30.0-36.0 g/dL SLN  RDW 14.714.7 82.9-56.211.5-15.5 % SLN  Platelet Count 296 150-400 K/uL SLN  Lipid Profile  Result: 04/05/2013 12:06 PM ( Status: F )  Cholesterol 163 0-200 mg/dL SLN C  Triglyceride 130176 H <150 mg/dL SLN  HDL Cholesterol 53 >39 mg/dL SLN  Total Chol/HDL Ratio 3.1 Ratio SLN  VLDL Cholesterol (Calc) 35 8-650-40 mg/dL SLN  LDL Cholesterol (Calc) 75  SH, Ultrasensitive  Result: 04/08/2013 7:24 PM ( Status: F ) C  TSH 1.481 0.350-4.500 UIU/mL   Comprehensive Metabolic Panel  Result: 06/07/2013 5:01 PM ( Status: F ) C  Sodium 138 135-145 mEq/L SLN  Potassium 3.5 3.5-5.3 mEq/L SLN  Chloride 103 96-112 mEq/L SLN  CO2 25 19-32 mEq/L SLN  Glucose 90 70-99 mg/dL SLN  BUN 12 7-846-23 mg/dL SLN  Creatinine 6.960.68 2.95-2.840.50-1.10 mg/dL SLN  Bilirubin, Total 0.4 0.2-1.2 mg/dL SLN  Alkaline Phosphatase 83 39-117 U/L SLN  AST/SGOT 19 0-37 U/L SLN  ALT/SGPT 14 0-35 U/L SLN  Total Protein 5.4 L 6.0-8.3 g/dL SLN  Albumin 3.1 L 1.3-2.43.5-5.2 g/dL SLN  Calcium 8.6 4.0-10.28.4-10.5 mg/dL SLN   Basic Metabolic Panel    Result: 08/07/2013 4:12 PM   ( Status: F )       Sodium 144     135-145 mEq/L SLN   Potassium 5.0     3.5-5.3 mEq/L SLN   Chloride 101     96-112 mEq/L SLN   CO2 30     19-32 mEq/L SLN   Glucose 78     70-99 mg/dL SLN   BUN 10     7-256-23 mg/dL SLN   Creatinine 3.660.67     0.50-1.10 mg/dL SLN   Calcium 9.0     4.4-03.48.4-10.5 mg/dL SLN  Assessment/Plan 1. Vascular dementia without  behavioral disturbance -conts to be stable without significant changes in memory or function  2. Essential hypertension -stable on current medicatios  3. Unspecified hypothyroidism TSH 1.4 in feb 2015, conts on synthroid 25 mcg daily   4. Loss of weight -remains on remeron with positive weight gain  5. Generalized anxiety disorder -conts on cymbalta   6. Osteoarthritis of multiple joints, unspecified osteoarthritis type Stable on current medications

## 2013-10-23 ENCOUNTER — Encounter: Payer: Self-pay | Admitting: Internal Medicine

## 2013-10-23 ENCOUNTER — Non-Acute Institutional Stay (SKILLED_NURSING_FACILITY): Payer: Medicare Other | Admitting: Internal Medicine

## 2013-10-23 DIAGNOSIS — E039 Hypothyroidism, unspecified: Secondary | ICD-10-CM

## 2013-10-23 DIAGNOSIS — R634 Abnormal weight loss: Secondary | ICD-10-CM

## 2013-10-23 DIAGNOSIS — F0150 Vascular dementia without behavioral disturbance: Secondary | ICD-10-CM

## 2013-10-23 DIAGNOSIS — F3289 Other specified depressive episodes: Secondary | ICD-10-CM

## 2013-10-23 DIAGNOSIS — F32A Depression, unspecified: Secondary | ICD-10-CM | POA: Insufficient documentation

## 2013-10-23 DIAGNOSIS — M159 Polyosteoarthritis, unspecified: Secondary | ICD-10-CM

## 2013-10-23 DIAGNOSIS — F329 Major depressive disorder, single episode, unspecified: Secondary | ICD-10-CM

## 2013-10-23 DIAGNOSIS — I4891 Unspecified atrial fibrillation: Secondary | ICD-10-CM

## 2013-10-23 DIAGNOSIS — I10 Essential (primary) hypertension: Secondary | ICD-10-CM

## 2013-10-23 NOTE — Assessment & Plan Note (Signed)
No sx;continue synthroid 25 mcg

## 2013-10-23 NOTE — Assessment & Plan Note (Signed)
Followed bu psych- continue cymbalta

## 2013-10-23 NOTE — Assessment & Plan Note (Signed)
Pt on namenda and aricept-will continue

## 2013-10-23 NOTE — Progress Notes (Signed)
MRN: [id number] Name: Ebony [name]  Sex: female Age: 80 y.o. DOB: 21-Sep-1931  PSC #: [name] Facility/Room: 207 Level Of Care: SNF Provider: Merrilee Seashore D Emergency Contacts: Extended Emergency Contact Information Primary Emergency Contact: Coggins,Larry Address: [address]          [place], [place] [address] [place] Home Phone: [phone] Work Phone: [phone] Relation: Son  Code Status: DNR  Allergies: Penicillins; Ace inhibitors; Biaxin; Sulfa antibiotics; and Cozaar  Chief Complaint  Patient presents with  . Medical Management of Chronic Issues    HPI: Patient is 80 y.o. female who is being seen for routine issues.   Past Medical History  Diagnosis Date  . Hypertension   . Subarachnoid hemorrhage following injury 05/16/2011  . Unspecified hypothyroidism 04/15/2012  . SDH (subdural hematoma) 05/16/2011  . Hiatal hernia   . Allergic rhinitis   . Fibrocystic breast disease   . Anxiety   . IFG (impaired fasting glucose)   . Arthritis     Knees  . Hyperlipidemia   . Depression     Past Surgical History  Procedure Laterality Date  . Abdominal hysterectomy    . Breast surgery    . Tonsillectomy    . Eye surgery        Medication List       This list is accurate as of: 10/23/13 12:55 PM.  Always use your most recent med list.               acetaminophen 325 MG tablet  Commonly known as:  TYLENOL  Take 2 tablets (650 mg total) by mouth every 6 (six) hours as needed.     ALPRAZolam 0.5 MG tablet  Commonly known as:  XANAX  Take one tablet by mouth at bedtime for anxiety     aspirin 81 MG chewable tablet  Chew 81 mg by mouth daily.     cetirizine 10 MG tablet  Commonly known as:  ZYRTEC  Take 10 mg by mouth daily.     Cranberry 425 MG Caps  Take by mouth daily.     diltiazem 120 MG 24 hr capsule  Commonly known as:  CARDIZEM CD  Take 120 mg by mouth daily. for angina.     docusate sodium 100 MG capsule   Commonly known as:  COLACE  Take 100 mg by mouth daily. for  Constipation.     donepezil 5 MG tablet  Commonly known as:  ARICEPT  Take 10 mg by mouth at bedtime as needed. For memory     DULoxetine 30 MG capsule  Commonly known as:  CYMBALTA  Take 30 mg by mouth daily. for depression.     levothyroxine 25 MCG tablet  Commonly known as:  SYNTHROID, LEVOTHROID  Take 1 tablet (25 mcg total) by mouth daily before breakfast.     metoprolol succinate 100 MG 24 hr tablet  Commonly known as:  TOPROL-XL  Take 100 mg by mouth every morning. for HTN.     mirtazapine 15 MG tablet  Commonly known as:  REMERON  Take 15 mg by mouth at bedtime.     multivitamin with minerals Tabs tablet  Take 1 tablet by mouth every morning.     NAMENDA 10 MG tablet  Generic drug:  memantine  Take 10 mg by mouth 2 (two) times daily.     naproxen sodium 220 MG tablet  Commonly known as:  ANAPROX  Take 220 mg by mouth 3 (three) times  daily as needed. For pain     phenazopyridine 100 MG tablet  Commonly known as:  PYRIDIUM  Take 100 mg by mouth 3 (three) times daily as needed. for burning on urination or pain.        No orders of the defined types were placed in this encounter.    Immunization History  Administered Date(s) Administered  . H1N1 03/23/2008  . Influenza-Unspecified 01/01/2011, 04/16/2012  . Pneumococcal Polysaccharide-23 01/01/2011  . Pneumococcal-Unspecified 12/01/2011  . Tdap 12/25/2010  . Zoster 11/25/2007    History  Substance Use Topics  . Smoking status: Never Smoker   . Smokeless tobacco: Not on file  . Alcohol Use: No    Review of Systems  DATA OBTAINED: from patient GENERAL: Feels well no fevers, fatigue, appetite changes SKIN: No itching, rash HEENT: No complaint RESPIRATORY: No cough, wheezing, SOB CARDIAC: No chest pain, palpitations, lower extremity edema  GI: No abdominal pain, No N/V/D or constipation, No heartburn or reflux  GU: No dysuria, frequency  or urgency, or incontinence  MUSCULOSKELETAL: No unrelieved bone/joint pain NEUROLOGIC: No headache, dizziness or focal weakness PSYCHIATRIC: No overt anxiety or sadness. Sleeps well.   Filed Vitals:   10/23/13 1246  BP: 148/72  Pulse: 58  Temp: 98.7 F (37.1 C)  Resp: 18    Physical Exam  GENERAL APPEARANCE: Alert, conversant. Appropriately groomed. No acute distress ; WF, nicely dressed, nicely spoken SKIN: No diaphoresis rash, or wounds HEENT: Unremarkable RESPIRATORY: Breathing is even, unlabored. Lung sounds are clear   CARDIOVASCULAR: Heart RRR no murmurs, rubs or gallops. No peripheral edema  GASTROINTESTINAL: Abdomen is soft, non-tender, not distended w/ normal bowel sounds.  GENITOURINARY: Bladder non tender, not distended  MUSCULOSKELETAL: No abnormal joints or musculature NEUROLOGIC: Cranial nerves 2-12 grossly intact. Moves all extremities no tremor. PSYCHIATRIC: Mood and affect appropriate to situation, no behavioral issues  Patient Active Problem List   Diagnosis Date Noted  . Depression   . OA (osteoarthritis) 01/06/2013  . Diarrhea 07/27/2012  . UTI (urinary tract infection) 07/13/2012  . Loss of weight 07/13/2012  . Vascular dementia without behavioral disturbance 06/20/2012  . Unspecified hypothyroidism 04/15/2012  . Atrial fibrillation with rapid ventricular response 04/12/2012  . Right wrist pain 04/12/2012  . Right hand pain 04/12/2012  . Acute ischemic stroke 04/11/2012  . Delirium 04/10/2012  . traumatic leg ulcer 04/10/2012  . Hypokalemia 04/10/2012  . Primary lactic acidemia 04/10/2012  . Abnormality of gait 04/10/2012  . Fall 05/16/2011  . SDH (subdural hematoma) 05/16/2011  . Subarachnoid hemorrhage following injury 05/16/2011  . Scalp laceration 05/16/2011  . HTN (hypertension) 05/16/2011  . Anxiety disorder 05/16/2011    CBC    Component Value Date/Time   WBC 8.9 04/13/2012 0527   RBC 3.28* 04/13/2012 0527   HGB 9.7* 04/13/2012 0527    HCT 28.3* 04/13/2012 0527   PLT 187 04/13/2012 0527   MCV 86.3 04/13/2012 0527   LYMPHSABS 1.5 04/09/2012 2136   MONOABS 0.6 04/09/2012 2136   EOSABS 0.0 04/09/2012 2136   BASOSABS 0.0 04/09/2012 2136    CMP     Component Value Date/Time   NA 133* 04/15/2012 0439   K 3.7 04/15/2012 0439   CL 95* 04/15/2012 0439   CO2 27 04/15/2012 0439   GLUCOSE 97 04/15/2012 0439   BUN 12 04/15/2012 0439   CREATININE 0.58 04/15/2012 0439   CALCIUM 9.1 04/15/2012 0439   PROT 6.8 04/10/2012 0458   ALBUMIN 3.6 04/10/2012 0458   AST  66* 04/10/2012 0458   ALT 33 04/10/2012 0458   ALKPHOS 134* 04/10/2012 0458   BILITOT 0.7 04/10/2012 0458   GFRNONAA 86* 04/15/2012 0439   GFRAA >90 04/15/2012 0439    Assessment and Plan  Atrial fibrillation with rapid ventricular response cardizem and toprol for rate/rhythm ;ASA as prophylaxis  HTN (hypertension) Stable on meds used for afib  Unspecified hypothyroidism No sx;continue synthroid 25 mcg  Vascular dementia without behavioral disturbance Pt on namenda and aricept-will continue  OA (osteoarthritis) It's chronic but no real c/o  Loss of weight Continue remeron nightly  Depression Followed bu psych- continue cymbalta    Margit Hanks, MD

## 2013-10-23 NOTE — Assessment & Plan Note (Signed)
Stable on meds used for afib

## 2013-10-23 NOTE — Assessment & Plan Note (Signed)
Continue remeron nightly

## 2013-10-23 NOTE — Assessment & Plan Note (Signed)
cardizem and toprol for rate/rhythm ;ASA as prophylaxis

## 2013-10-23 NOTE — Assessment & Plan Note (Signed)
It's chronic but no real c/o

## 2013-11-02 ENCOUNTER — Other Ambulatory Visit: Payer: Self-pay | Admitting: *Deleted

## 2013-11-02 MED ORDER — ALPRAZOLAM 0.25 MG PO TABS: ORAL_TABLET | ORAL | Status: DC

## 2013-11-02 NOTE — Telephone Encounter (Signed)
[facility] 

## 2013-11-07 LAB — BASIC METABOLIC PANEL
BUN: 10 mg/dL (ref 4–21)
Creatinine: 0.8 mg/dL (ref 0.5–1.1)
Glucose: 85 mg/dL
Potassium: 3.4 mmol/L (ref 3.4–5.3)
SODIUM: 142 mmol/L (ref 137–147)

## 2013-11-17 ENCOUNTER — Non-Acute Institutional Stay (SKILLED_NURSING_FACILITY): Payer: Medicare Other | Admitting: Nurse Practitioner

## 2013-11-17 DIAGNOSIS — I10 Essential (primary) hypertension: Secondary | ICD-10-CM

## 2013-11-17 DIAGNOSIS — M159 Polyosteoarthritis, unspecified: Secondary | ICD-10-CM

## 2013-11-17 DIAGNOSIS — G479 Sleep disorder, unspecified: Secondary | ICD-10-CM

## 2013-11-17 DIAGNOSIS — R634 Abnormal weight loss: Secondary | ICD-10-CM

## 2013-11-17 DIAGNOSIS — F411 Generalized anxiety disorder: Secondary | ICD-10-CM

## 2013-11-17 MED ORDER — MELATONIN 3 MG PO CAPS: ORAL_CAPSULE | ORAL | Status: AC

## 2013-11-17 NOTE — Progress Notes (Signed)
Patient ID: Ebony [name], female   DOB: 1931/05/18, 80 y.o.   MRN: [id number] Patient ID: Ebony [name], female   DOB: 09/24/1931, 80 y.o.   MRN: [id number]    Nursing Home Location:  [facility]   Place of Service: SNF (31)  PCP: Harlow Asa, MD  Allergies  Allergen Reactions  . Penicillins Diarrhea  . Ace Inhibitors   . Biaxin [Clarithromycin] Diarrhea  . Sulfa Antibiotics Diarrhea  . Cozaar [Losartan Potassium] Rash    Chief Complaint  Patient presents with  . Medical Management of Chronic Issues    HPI:  80 year old female being seen today for routine follow up on chronic conditions. Pt with pmh of HTN, hypothyroid, TIA, anxiety, OA, hyperlipidemia,weight loss. Pt has been doing well in the last month. Has had an irritation on lip that was worse earlier this week. Had large raised area that is minimal now.  Eating well. With more weight gain noted. Back to baseline weight at this time.  Reports increase in activites to keep her busy. Mood is fair. Sometimes better than others Review of Systems:  Review of Systems  Constitutional: Negative for fever, chills and malaise/fatigue.  HENT: Negative for sore throat.   Eyes: Negative for blurred vision.  Respiratory: Negative for cough, sputum production and shortness of breath.   Cardiovascular: Negative for chest pain, palpitations and leg swelling.  Gastrointestinal: Negative for heartburn, abdominal pain, diarrhea and blood in stool.  Genitourinary: Negative for dysuria, urgency and frequency.  Musculoskeletal: Negative for myalgias. Back pain: at times.  Skin: Negative.   Neurological: Negative for dizziness, sensory change, focal weakness, weakness and headaches.  Psychiatric/Behavioral: Negative for depression. The patient is not nervous/anxious. Insomnia: trouble sleeping at times.      Past Medical History  Diagnosis Date  . Hypertension   . Subarachnoid hemorrhage following injury 05/16/2011  .  Unspecified hypothyroidism 04/15/2012  . SDH (subdural hematoma) 05/16/2011  . Hiatal hernia   . Allergic rhinitis   . Fibrocystic breast disease   . Anxiety   . IFG (impaired fasting glucose)   . Arthritis     Knees  . Hyperlipidemia   . Depression    Past Surgical History  Procedure Laterality Date  . Abdominal hysterectomy    . Breast surgery    . Tonsillectomy    . Eye surgery     Social History:   reports that she has never smoked. She does not have any smokeless tobacco history on file. She reports that she does not drink alcohol or use illicit drugs.  Family History  Problem Relation Age of Onset  . CVA Neg Hx   . Diabetes Neg Hx   . Hypertension Neg Hx   . Lung cancer Sister     Medications: Patient's Medications  New Prescriptions   No medications on file  Previous Medications   ACETAMINOPHEN (TYLENOL) 325 MG TABLET    Take 2 tablets (650 mg total) by mouth every 6 (six) hours as needed.   ALPRAZOLAM (XANAX) 0.25 MG TABLET    Take one tablet by mouth at bedtime to help rest   ASPIRIN 81 MG CHEWABLE TABLET    Chew 81 mg by mouth daily.   CETIRIZINE (ZYRTEC) 10 MG TABLET    Take 10 mg by mouth daily.   CRANBERRY 425 MG CAPS    Take by mouth daily.   DILTIAZEM (CARDIZEM CD) 120 MG 24 HR CAPSULE    Take 120 mg  by mouth daily. for angina.   DOCUSATE SODIUM (COLACE) 100 MG CAPSULE    Take 100 mg by mouth daily. for  Constipation.   DONEPEZIL (ARICEPT) 5 MG TABLET    Take 10 mg by mouth at bedtime as needed. For memory   DULOXETINE (CYMBALTA) 30 MG CAPSULE    Take 30 mg by mouth daily. for depression.   LEVOTHYROXINE (SYNTHROID, LEVOTHROID) 25 MCG TABLET    Take 1 tablet (25 mcg total) by mouth daily before breakfast.   MEMANTINE (NAMENDA) 10 MG TABLET    Take 10 mg by mouth 2 (two) times daily.   METOPROLOL (TOPROL-XL) 100 MG 24 HR TABLET    Take 100 mg by mouth every morning. for HTN.   MIRTAZAPINE (REMERON) 15 MG TABLET    Take 15 mg by mouth at bedtime.   MULTIPLE  VITAMIN (MULITIVITAMIN WITH MINERALS) TABS    Take 1 tablet by mouth every morning.   NAPROXEN SODIUM (ANAPROX) 220 MG TABLET    Take 220 mg by mouth 3 (three) times daily as needed. For pain   PHENAZOPYRIDINE (PYRIDIUM) 100 MG TABLET    Take 100 mg by mouth 3 (three) times daily as needed. for burning on urination or pain.  Modified Medications   Modified Medication Previous Medication   ALPRAZOLAM (XANAX) 0.5 MG TABLET ALPRAZolam (XANAX) 0.5 MG tablet      Take 0.25 mg by mouth at bedtime. Take one tablet by mouth at bedtime for anxiety    Take one tablet by mouth at bedtime for anxiety  Discontinued Medications   No medications on file     Physical Exam:  Filed Vitals:   11/17/13 1614  BP: 123/69  Pulse: 76  Temp: 98.6 F (37 C)  Resp: 20  Weight: 135 lb (61.236 kg)   Physical Exam  Nursing note and vitals reviewed. Constitutional: She is oriented to person, place, and time and well-developed, well-nourished, and in no distress.  HENT:  Head: Normocephalic and atraumatic.  Mouth/Throat: Oropharynx is clear and moist. No oropharyngeal exudate.  Eyes: Conjunctivae and EOM are normal. Pupils are equal, round, and reactive to light.  Neck: Normal range of motion. Neck supple.  Cardiovascular: Normal rate, regular rhythm and normal heart sounds.   Pulmonary/Chest: Effort normal and breath sounds normal.  Abdominal: Soft. Bowel sounds are normal. She exhibits no distension. There is no tenderness.  Musculoskeletal: Normal range of motion. She exhibits no edema and no tenderness.  Neurological: She is alert and oriented to person, place, and time.  Skin: Skin is warm and dry.  Small raised area on her bottom lip   Psychiatric: Affect normal.     Labs reviewed:  Basic Metabolic Panel  Result: 11/08/2012 4:06 PM ( Status: F )  Sodium 140 135-145 mEq/L SLN  Potassium 3.7 3.5-5.3 mEq/L SLN  Chloride 105 96-112 mEq/L SLN  CO2 28 19-32 mEq/L SLN  Glucose 86 70-99 mg/dL SLN  BUN  16 1-61 mg/dL SLN  Creatinine 0.96 0.45-4.09 mg/dL SLN  Calcium 9.7 8.1-19.1 mg/dL SLN  TSH, Ultrasensitive  Result: 11/08/2012 5:22 PM ( Status: F )  TSH 2.025 0.350-4.500 uIU/mL SLN  Basic Metabolic Panel  Result: 01/08/2013 4:44 PM ( Status: F )  Sodium 138 135-145 mEq/L SLN  Potassium 4.7 3.5-5.3 mEq/L SLN  Chloride 104 96-112 mEq/L SLN  CO2 28 19-32 mEq/L SLN  Glucose 103 H 70-99 mg/dL SLN  BUN 24 H 4-78 mg/dL SLN  Creatinine 2.95 6.21-3.08 mg/dL SLN  Calcium  9.3 8.4-10.5 mg/dL SLN  TSH, Ultrasensitive  Result: 01/08/2013 5:10 PM ( Status: F )  TSH 2.069  CBC NO Diff (Complete Blood Count)  Result: 04/05/2013 12:15 PM ( Status: F ) C  WBC 12.6 H 4.0-10.5 K/uL SLN  RBC 5.03 3.87-5.11 MIL/uL SLN  Hemoglobin 14.6 12.0-15.0 g/dL SLN  Hematocrit 13.0 86.5-78.4 % SLN  MCV 85.3 78.0-100.0 fL SLN  MCH 29.0 26.0-34.0 pg SLN  MCHC 34.0 30.0-36.0 g/dL SLN  RDW 69.6 29.5-28.4 % SLN  Platelet Count 296 150-400 K/uL SLN  Lipid Profile  Result: 04/05/2013 12:06 PM ( Status: F )  Cholesterol 163 0-200 mg/dL SLN C  Triglyceride 132 H <150 mg/dL SLN  HDL Cholesterol 53 >39 mg/dL SLN  Total Chol/HDL Ratio 3.1 Ratio SLN  VLDL Cholesterol (Calc) 35 4-40 mg/dL SLN  LDL Cholesterol (Calc) 75  SH, Ultrasensitive  Result: 04/08/2013 7:24 PM ( Status: F ) C  TSH 1.481 0.350-4.500 UIU/mL   Comprehensive Metabolic Panel  Result: 06/07/2013 5:01 PM ( Status: F ) C  Sodium 138 135-145 mEq/L SLN  Potassium 3.5 3.5-5.3 mEq/L SLN  Chloride 103 96-112 mEq/L SLN  CO2 25 19-32 mEq/L SLN  Glucose 90 70-99 mg/dL SLN  BUN 12 1-02 mg/dL SLN  Creatinine 7.25 3.66-4.40 mg/dL SLN  Bilirubin, Total 0.4 0.2-1.2 mg/dL SLN  Alkaline Phosphatase 83 39-117 U/L SLN  AST/SGOT 19 0-37 U/L SLN  ALT/SGPT 14 0-35 U/L SLN  Total Protein 5.4 L 6.0-8.3 g/dL SLN  Albumin 3.1 L 3.4-7.4 g/dL SLN  Calcium 8.6 2.5-95.6 mg/dL SLN   Basic Metabolic Panel    Result: 08/07/2013 4:12 PM   ( Status: F )       Sodium 144      135-145 mEq/L SLN   Potassium 5.0     3.5-5.3 mEq/L SLN   Chloride 101     96-112 mEq/L SLN   CO2 30     19-32 mEq/L SLN   Glucose 78     70-99 mg/dL SLN   BUN 10     3-87 mg/dL SLN   Creatinine 5.64     0.50-1.10 mg/dL SLN   Calcium 9.0     3.3-29.5 mg/dL SLN   Basic Metabolic Panel    Result: 11/07/2013 4:24 PM   ( Status: F )       Sodium 142     135-145 mEq/L SLN   Potassium 3.4   L 3.5-5.3 mEq/L SLN   Chloride 106     96-112 mEq/L SLN   CO2 29     19-32 mEq/L SLN   Glucose 85     70-99 mg/dL SLN   BUN 10     1-88 mg/dL SLN   Creatinine 4.16     0.50-1.10 mg/dL SLN   Calcium 9.3     6.0-63.0 mg/dL SLN  Assessment/Plan 1. Essential hypertension Controlled on current medications   2. Generalized anxiety disorder -fairly well controlled on current medications  3. Loss of weight -weight is at baseline, will titrate off remeron at this time  4. Osteoarthritis of multiple joints, unspecified osteoarthritis type -controlled  5. Trouble in sleeping -xanax was reduced and with that reduction she has had increase in trouble with sleeping -will start melatonin to help with sleep

## 2013-11-27 ENCOUNTER — Other Ambulatory Visit: Payer: Self-pay | Admitting: *Deleted

## 2013-11-27 MED ORDER — HYDROCODONE-ACETAMINOPHEN 5-325 MG PO TABS: ORAL_TABLET | ORAL | Status: DC

## 2013-11-27 NOTE — Telephone Encounter (Signed)
[facility] 

## 2013-12-04 ENCOUNTER — Non-Acute Institutional Stay (SKILLED_NURSING_FACILITY): Payer: Medicare Other | Admitting: Internal Medicine

## 2013-12-04 DIAGNOSIS — F329 Major depressive disorder, single episode, unspecified: Secondary | ICD-10-CM

## 2013-12-04 DIAGNOSIS — E034 Atrophy of thyroid (acquired): Secondary | ICD-10-CM

## 2013-12-04 DIAGNOSIS — G4700 Insomnia, unspecified: Secondary | ICD-10-CM

## 2013-12-04 DIAGNOSIS — R197 Diarrhea, unspecified: Secondary | ICD-10-CM

## 2013-12-04 DIAGNOSIS — E038 Other specified hypothyroidism: Secondary | ICD-10-CM

## 2013-12-04 DIAGNOSIS — F0150 Vascular dementia without behavioral disturbance: Secondary | ICD-10-CM

## 2013-12-04 DIAGNOSIS — F411 Generalized anxiety disorder: Secondary | ICD-10-CM

## 2013-12-04 DIAGNOSIS — F32A Depression, unspecified: Secondary | ICD-10-CM

## 2013-12-04 NOTE — Assessment & Plan Note (Signed)
Pt had diarrhea several times yesterday and got 2 doses imodium and none since;feels better today but not well,no diarrhea there is an enteritis going around, not serious; bland diet ,liquids

## 2013-12-07 ENCOUNTER — Encounter: Payer: Self-pay | Admitting: Internal Medicine

## 2013-12-07 DIAGNOSIS — G4700 Insomnia, unspecified: Secondary | ICD-10-CM | POA: Insufficient documentation

## 2013-12-07 NOTE — Progress Notes (Signed)
MRN: [id number] Name: Joslyn Devon  Sex: female Age: 80 y.o. DOB: 1931/10/18  PSC #: [name] Facility/Room:207 Level Of Care: SNF Provider: Merrilee Seashore D Emergency Contacts: Extended Emergency Contact Information Primary Emergency Contact: Coggins,Larry Address: [address]          [place], [place] [address] [place] Home Phone: [phone] Work Phone: [phone] Relation: Son  Code Status: DNR  Allergies: Penicillins; Ace inhibitors; Biaxin; Sulfa antibiotics; and Cozaar  Chief Complaint  Patient presents with  . Medical Management of Chronic Issues    HPI: Patient is 80 y.o. female who is being seen for routine issues  Past Medical History  Diagnosis Date  . Hypertension   . Subarachnoid hemorrhage following injury 05/16/2011  . Unspecified hypothyroidism 04/15/2012  . SDH (subdural hematoma) 05/16/2011  . Hiatal hernia   . Allergic rhinitis   . Fibrocystic breast disease   . Anxiety   . IFG (impaired fasting glucose)   . Arthritis     Knees  . Hyperlipidemia   . Depression     Past Surgical History  Procedure Laterality Date  . Abdominal hysterectomy    . Breast surgery    . Tonsillectomy    . Eye surgery        Medication List       This list is accurate as of: 12/04/13 11:59 PM.  Always use your most recent med list.               acetaminophen 325 MG tablet  Commonly known as:  TYLENOL  Take 2 tablets (650 mg total) by mouth every 6 (six) hours as needed.     ALPRAZolam 0.5 MG tablet  Commonly known as:  XANAX  Take 0.25 mg by mouth at bedtime. Take one tablet by mouth at bedtime for anxiety     ALPRAZolam 0.25 MG tablet  Commonly known as:  XANAX  Take one tablet by mouth at bedtime to help rest     aspirin 81 MG chewable tablet  Chew 81 mg by mouth daily.     cetirizine 10 MG tablet  Commonly known as:  ZYRTEC  Take 10 mg by mouth daily.     Cranberry 425 MG Caps  Take by mouth daily.     diltiazem  120 MG 24 hr capsule  Commonly known as:  CARDIZEM CD  Take 120 mg by mouth daily. for angina.     docusate sodium 100 MG capsule  Commonly known as:  COLACE  Take 100 mg by mouth daily. for  Constipation.     donepezil 5 MG tablet  Commonly known as:  ARICEPT  Take 10 mg by mouth at bedtime as needed. For memory     DULoxetine 30 MG capsule  Commonly known as:  CYMBALTA  Take 30 mg by mouth daily. for depression.     HYDROcodone-acetaminophen 5-325 MG per tablet  Commonly known as:  NORCO/VICODIN  Take one tablet by mouth twice daily as needed for pain     levothyroxine 25 MCG tablet  Commonly known as:  SYNTHROID, LEVOTHROID  Take 1 tablet (25 mcg total) by mouth daily before breakfast.     Melatonin 3 MG Caps  By mouth qhs for sleep     metoprolol succinate 100 MG 24 hr tablet  Commonly known as:  TOPROL-XL  Take 100 mg by mouth every morning. for HTN.     multivitamin with minerals Tabs tablet  Take 1 tablet by mouth  every morning.     NAMENDA 10 MG tablet  Generic drug:  memantine  Take 10 mg by mouth 2 (two) times daily.     naproxen sodium 220 MG tablet  Commonly known as:  ANAPROX  Take 220 mg by mouth 3 (three) times daily as needed. For pain     phenazopyridine 100 MG tablet  Commonly known as:  PYRIDIUM  Take 100 mg by mouth 3 (three) times daily as needed. for burning on urination or pain.        No orders of the defined types were placed in this encounter.    Immunization History  Administered Date(s) Administered  . H1N1 03/23/2008  . Influenza-Unspecified 01/01/2011, 04/16/2012  . Pneumococcal Polysaccharide-23 01/01/2011  . Pneumococcal-Unspecified 12/01/2011  . Tdap 12/25/2010  . Zoster 11/25/2007    History  Substance Use Topics  . Smoking status: Never Smoker   . Smokeless tobacco: Not on file  . Alcohol Use: No    Review of Systems  DATA OBTAINED: from patient GENERAL:  no fevers,+ fatigue SKIN: No itching, rash HEENT: No  complaint RESPIRATORY: No cough, wheezing, SOB CARDIAC: No chest pain, palpitations, lower extremity edema  GI: nausea, no vomiting, diarrhea yesterday improved by immodium, no diarrhea today yetr GU: No dysuria, frequency or urgency, or incontinence  MUSCULOSKELETAL: No unrelieved bone/joint pain NEUROLOGIC: No headache, dizziness  PSYCHIATRIC: No overt anxiety or sadness  Filed Vitals:   12/04/13 1120  BP: 148/72  Pulse: 68  Temp: 97.1 F (36.2 C)  Resp: 20    Physical Exam  GENERAL APPEARANCE: Alert, conversant, No acute distress  SKIN: No diaphoresis rash, or wounds HEENT: Unremarkable RESPIRATORY: Breathing is even, unlabored. Lung sounds are clear   CARDIOVASCULAR: Heart RRR no murmurs, rubs or gallops. No peripheral edema  GASTROINTESTINAL: Abdomen is soft, non-tender, not distended w/ normal bowel sounds.  GENITOURINARY: Bladder non tender, not distended  MUSCULOSKELETAL: No abnormal joints or musculature NEUROLOGIC: Cranial nerves 2-12 grossly intact PSYCHIATRIC: Mood and affect appropriate to situation, no behavioral issues  Patient Active Problem List   Diagnosis Date Noted  . Insomnia 12/07/2013  . Depression   . OA (osteoarthritis) 01/06/2013  . Diarrhea 07/27/2012  . UTI (urinary tract infection) 07/13/2012  . Loss of weight 07/13/2012  . Vascular dementia without behavioral disturbance 06/20/2012  . Hypothyroidism due to acquired atrophy of thyroid 04/15/2012  . Atrial fibrillation with rapid ventricular response 04/12/2012  . Right wrist pain 04/12/2012  . Right hand pain 04/12/2012  . Acute ischemic stroke 04/11/2012  . Delirium 04/10/2012  . traumatic leg ulcer 04/10/2012  . Hypokalemia 04/10/2012  . Primary lactic acidemia 04/10/2012  . Abnormality of gait 04/10/2012  . Fall 05/16/2011  . SDH (subdural hematoma) 05/16/2011  . Subarachnoid hemorrhage following injury 05/16/2011  . Scalp laceration 05/16/2011  . HTN (hypertension) 05/16/2011  .  Anxiety disorder 05/16/2011    CBC    Component Value Date/Time   WBC 8.9 04/13/2012 0527   RBC 3.28* 04/13/2012 0527   HGB 9.7* 04/13/2012 0527   HCT 28.3* 04/13/2012 0527   PLT 187 04/13/2012 0527   MCV 86.3 04/13/2012 0527   LYMPHSABS 1.5 04/09/2012 2136   MONOABS 0.6 04/09/2012 2136   EOSABS 0.0 04/09/2012 2136   BASOSABS 0.0 04/09/2012 2136    CMP     Component Value Date/Time   NA 142 11/07/2013   NA 133* 04/15/2012 0439   K 3.4 11/07/2013   CL 95* 04/15/2012 0439   CO2  27 04/15/2012 0439   GLUCOSE 97 04/15/2012 0439   BUN 10 11/07/2013   BUN 12 04/15/2012 0439   CREATININE 0.8 11/07/2013   CREATININE 0.58 04/15/2012 0439   CALCIUM 9.1 04/15/2012 0439   PROT 6.8 04/10/2012 0458   ALBUMIN 3.6 04/10/2012 0458   AST 66* 04/10/2012 0458   ALT 33 04/10/2012 0458   ALKPHOS 134* 04/10/2012 0458   BILITOT 0.7 04/10/2012 0458   GFRNONAA 86* 04/15/2012 0439   GFRAA >90 04/15/2012 0439    Assessment and Plan  Diarrhea Pt had diarrhea several times yesterday and got 2 doses imodium and none since;feels better today but not well,no diarrhea there is an enteritis going around, not serious; bland diet ,liquids  Depression Cymbalta has been increased to 60 mg daily per NCEPS, remeron was d/c and xanax 0.25 daily prn; monitor mood  Insomnia Melatonin 3mg  qHS prn  Anxiety disorder Xanax has been decreased but cymbalta has been increased;trying to achieve a new balance  Hypothyroidism due to acquired atrophy of thyroid Last TSH  1.487 on 25 mcg daily  Vascular dementia without behavioral disturbance Complicated by depression and anxiety; continue aricept and namenda, along with cymbalta and xanax    Margit HanksALEXANDER, Zhamir Pirro D, MD

## 2013-12-07 NOTE — Assessment & Plan Note (Signed)
Complicated by depression and anxiety; continue aricept and namenda, along with cymbalta and xanax

## 2013-12-07 NOTE — Assessment & Plan Note (Signed)
Cymbalta has been increased to 60 mg daily per NCEPS, remeron was d/c and xanax 0.25 daily prn; monitor mood

## 2013-12-07 NOTE — Assessment & Plan Note (Signed)
Xanax has been decreased but cymbalta has been increased;trying to achieve a new balance

## 2013-12-07 NOTE — Assessment & Plan Note (Signed)
Melatonin 3mg  qHS prn

## 2013-12-07 NOTE — Assessment & Plan Note (Signed)
Last TSH  1.487 on 25 mcg daily

## 2014-01-16 ENCOUNTER — Non-Acute Institutional Stay (SKILLED_NURSING_FACILITY): Payer: Medicare Other | Admitting: Nurse Practitioner

## 2014-01-16 DIAGNOSIS — R197 Diarrhea, unspecified: Secondary | ICD-10-CM

## 2014-01-16 DIAGNOSIS — F0390 Unspecified dementia without behavioral disturbance: Secondary | ICD-10-CM

## 2014-01-16 DIAGNOSIS — J0100 Acute maxillary sinusitis, unspecified: Secondary | ICD-10-CM

## 2014-01-16 DIAGNOSIS — I10 Essential (primary) hypertension: Secondary | ICD-10-CM

## 2014-01-16 DIAGNOSIS — F411 Generalized anxiety disorder: Secondary | ICD-10-CM

## 2014-01-16 DIAGNOSIS — R110 Nausea: Secondary | ICD-10-CM

## 2014-01-16 NOTE — Progress Notes (Signed)
Patient ID: Ebony DevonNancy C [name], female   DOB: 1931-08-23, 80 y.o.   MRN: [id number]    Nursing Home Location:  [facility]   Place of Service: SNF (31)  PCP: Harlow AsaLUKING,W S, MD  Allergies  Allergen Reactions  . Penicillins Diarrhea  . Ace Inhibitors   . Biaxin [Clarithromycin] Diarrhea  . Sulfa Antibiotics Diarrhea  . Cozaar [Losartan Potassium] Rash    Chief Complaint  Patient presents with  . Medical Management of Chronic Issues    HPI:  80 year old female being seen today for routine follow up on chronic conditions. Pt with pmh of HTN, hypothyroid, TIA, anxiety, OA, hyperlipidemia,weight loss. Pt reports she is not doing well. Pt had a fall a few weeks back and since then has been staying in bed more due to pain. Overall pain has improved and she is getting oob more but staff is having to encourage her.  Also psych adjusted medications but made her too lethargic and were changed back. She reports she still does not have a good appetite.  Also reports increase nasal congestion, decrease hearing out of her right ear.  Complaints of diarrhea (no recent antibiotic use) Also reporting that she is annoyed she can not get her TV to work right throughout the visit.     Review of Systems:  Review of Systems  Constitutional: Positive for malaise/fatigue. Negative for fever and chills.  HENT: Positive for congestion and hearing loss. Negative for ear discharge, ear pain, nosebleeds, sore throat and tinnitus.   Eyes: Negative for blurred vision.  Respiratory: Positive for cough. Negative for sputum production and shortness of breath.   Cardiovascular: Negative for chest pain, palpitations and leg swelling.  Gastrointestinal: Positive for nausea and diarrhea. Negative for heartburn, abdominal pain and blood in stool.  Genitourinary: Negative for dysuria, urgency and frequency.  Musculoskeletal: Positive for back pain (at times). Negative for myalgias.  Skin: Negative.     Neurological: Positive for weakness. Negative for dizziness, sensory change, focal weakness and headaches.  Psychiatric/Behavioral: Negative for depression. The patient is nervous/anxious. Insomnia: trouble sleeping at times.      Past Medical History  Diagnosis Date  . Hypertension   . Subarachnoid hemorrhage following injury 05/16/2011  . Unspecified hypothyroidism 04/15/2012  . SDH (subdural hematoma) 05/16/2011  . Hiatal hernia   . Allergic rhinitis   . Fibrocystic breast disease   . Anxiety   . IFG (impaired fasting glucose)   . Arthritis     Knees  . Hyperlipidemia   . Depression    Past Surgical History  Procedure Laterality Date  . Abdominal hysterectomy    . Breast surgery    . Tonsillectomy    . Eye surgery     Social History:   reports that she has never smoked. She does not have any smokeless tobacco history on file. She reports that she does not drink alcohol or use illicit drugs.  Family History  Problem Relation Age of Onset  . CVA Neg Hx   . Diabetes Neg Hx   . Hypertension Neg Hx   . Lung cancer Sister     Medications: Patient's Medications  New Prescriptions   No medications on file  Previous Medications   ACETAMINOPHEN (TYLENOL) 325 MG TABLET    Take 2 tablets (650 mg total) by mouth every 6 (six) hours as needed.   ALPRAZOLAM (XANAX) 0.25 MG TABLET    Take one tablet by mouth at bedtime to help rest  ALPRAZOLAM (XANAX) 0.5 MG TABLET    Take 0.25 mg by mouth at bedtime. Take one tablet by mouth at bedtime for anxiety   ASPIRIN 81 MG CHEWABLE TABLET    Chew 81 mg by mouth daily.   CETIRIZINE (ZYRTEC) 10 MG TABLET    Take 10 mg by mouth daily.   CRANBERRY 425 MG CAPS    Take by mouth daily.   DILTIAZEM (CARDIZEM CD) 120 MG 24 HR CAPSULE    Take 120 mg by mouth daily. for angina.   DOCUSATE SODIUM (COLACE) 100 MG CAPSULE    Take 100 mg by mouth daily. for  Constipation.   DONEPEZIL (ARICEPT) 5 MG TABLET    Take 10 mg by mouth at bedtime as needed.  For memory   DULOXETINE (CYMBALTA) 30 MG CAPSULE    Take 30 mg by mouth daily. for depression.   HYDROCODONE-ACETAMINOPHEN (NORCO/VICODIN) 5-325 MG PER TABLET    Take one tablet by mouth twice daily as needed for pain   LEVOTHYROXINE (SYNTHROID, LEVOTHROID) 25 MCG TABLET    Take 1 tablet (25 mcg total) by mouth daily before breakfast.   MELATONIN 3 MG CAPS    By mouth qhs for sleep   MEMANTINE (NAMENDA) 10 MG TABLET    Take 10 mg by mouth 2 (two) times daily.   METOPROLOL (TOPROL-XL) 100 MG 24 HR TABLET    Take 100 mg by mouth every morning. for HTN.   MIRTAZAPINE (REMERON) 15 MG TABLET    Take 15 mg by mouth at bedtime.   MULTIPLE VITAMIN (MULITIVITAMIN WITH MINERALS) TABS    Take 1 tablet by mouth every morning.   NAPROXEN SODIUM (ANAPROX) 220 MG TABLET    Take 220 mg by mouth 3 (three) times daily as needed. For pain   PHENAZOPYRIDINE (PYRIDIUM) 100 MG TABLET    Take 100 mg by mouth 3 (three) times daily as needed. for burning on urination or pain.  Modified Medications   No medications on file  Discontinued Medications   No medications on file     Physical Exam:  Filed Vitals:   01/16/14 2100  BP: 100/71  Pulse: 70  Temp: 97.5 F (36.4 C)  Resp: 20   Physical Exam  Constitutional: She is oriented to person, place, and time and well-developed, well-nourished, and in no distress.  HENT:  Head: Normocephalic and atraumatic.  Right Ear: External ear and ear canal normal.  Left Ear: External ear and ear canal normal.  Mouth/Throat: Oropharynx is clear and moist. No oropharyngeal exudate.  Eyes: Conjunctivae and EOM are normal. Pupils are equal, round, and reactive to light.  Neck: Normal range of motion. Neck supple.  Cardiovascular: Normal rate, regular rhythm and normal heart sounds.   Pulmonary/Chest: Effort normal and breath sounds normal.  Abdominal: Soft. Bowel sounds are normal. She exhibits no distension. There is no tenderness.  Musculoskeletal: Normal range of motion.  She exhibits no edema or tenderness.  Neurological: She is alert and oriented to person, place, and time.  Skin: Skin is warm and dry.  Psychiatric: Affect normal. Her mood appears anxious.  Nursing note and vitals reviewed.    Labs reviewed:  Basic Metabolic Panel  Result: 11/08/2012 4:06 PM ( Status: F )  Sodium 140 135-145 mEq/L SLN  Potassium 3.7 3.5-5.3 mEq/L SLN  Chloride 105 96-112 mEq/L SLN  CO2 28 19-32 mEq/L SLN  Glucose 86 70-99 mg/dL SLN  BUN 16 1-61 mg/dL SLN  Creatinine 0.96 0.45-4.09 mg/dL SLN  Calcium 9.7 8.4-10.5 mg/dL SLN  TSH, Ultrasensitive  Result: 11/08/2012 5:22 PM ( Status: F )  TSH 2.025 0.350-4.500 uIU/mL SLN  Basic Metabolic Panel  Result: 01/08/2013 4:44 PM ( Status: F )  Sodium 138 135-145 mEq/L SLN  Potassium 4.7 3.5-5.3 mEq/L SLN  Chloride 104 96-112 mEq/L SLN  CO2 28 19-32 mEq/L SLN  Glucose 103 H 70-99 mg/dL SLN  BUN 24 H 1-27 mg/dL SLN  Creatinine 5.17 0.01-7.49 mg/dL SLN  Calcium 9.3 4.4-96.7 mg/dL SLN  TSH, Ultrasensitive  Result: 01/08/2013 5:10 PM ( Status: F )  TSH 2.069  CBC NO Diff (Complete Blood Count)  Result: 04/05/2013 12:15 PM ( Status: F ) C  WBC 12.6 H 4.0-10.5 K/uL SLN  RBC 5.03 3.87-5.11 MIL/uL SLN  Hemoglobin 14.6 12.0-15.0 g/dL SLN  Hematocrit 59.1 63.8-46.6 % SLN  MCV 85.3 78.0-100.0 fL SLN  MCH 29.0 26.0-34.0 pg SLN  MCHC 34.0 30.0-36.0 g/dL SLN  RDW 59.9 35.7-01.7 % SLN  Platelet Count 296 150-400 K/uL SLN  Lipid Profile  Result: 04/05/2013 12:06 PM ( Status: F )  Cholesterol 163 0-200 mg/dL SLN C  Triglyceride 793 H <150 mg/dL SLN  HDL Cholesterol 53 >39 mg/dL SLN  Total Chol/HDL Ratio 3.1 Ratio SLN  VLDL Cholesterol (Calc) 35 9-03 mg/dL SLN  LDL Cholesterol (Calc) 75  SH, Ultrasensitive  Result: 04/08/2013 7:24 PM ( Status: F ) C  TSH 1.481 0.350-4.500 UIU/mL   Comprehensive Metabolic Panel  Result: 06/07/2013 5:01 PM ( Status: F ) C  Sodium 138 135-145 mEq/L SLN  Potassium 3.5 3.5-5.3 mEq/L SLN  Chloride 103  96-112 mEq/L SLN  CO2 25 19-32 mEq/L SLN  Glucose 90 70-99 mg/dL SLN  BUN 12 0-09 mg/dL SLN  Creatinine 2.33 0.07-6.22 mg/dL SLN  Bilirubin, Total 0.4 0.2-1.2 mg/dL SLN  Alkaline Phosphatase 83 39-117 U/L SLN  AST/SGOT 19 0-37 U/L SLN  ALT/SGPT 14 0-35 U/L SLN  Total Protein 5.4 L 6.0-8.3 g/dL SLN  Albumin 3.1 L 6.3-3.3 g/dL SLN  Calcium 8.6 5.4-56.2 mg/dL SLN   Basic Metabolic Panel    Result: 08/07/2013 4:12 PM   ( Status: F )       Sodium 144     135-145 mEq/L SLN   Potassium 5.0     3.5-5.3 mEq/L SLN   Chloride 101     96-112 mEq/L SLN   CO2 30     19-32 mEq/L SLN   Glucose 78     70-99 mg/dL SLN   BUN 10     5-63 mg/dL SLN   Creatinine 8.93     0.50-1.10 mg/dL SLN   Calcium 9.0     7.3-42.8 mg/dL SLN   Basic Metabolic Panel    Result: 11/07/2013 4:24 PM   ( Status: F )       Sodium 142     135-145 mEq/L SLN   Potassium 3.4   L 3.5-5.3 mEq/L SLN   Chloride 106     96-112 mEq/L SLN   CO2 29     19-32 mEq/L SLN   Glucose 85     70-99 mg/dL SLN   BUN 10     7-68 mg/dL SLN   Creatinine 1.15     0.50-1.10 mg/dL SLN   Calcium 9.3     7.2-62.0 mg/dL SLN  Assessment/Plan  1. Generalized anxiety disorder Worse since adjustment of medications, decrease appetite since off remeron -remeron has been restarted, and Cymbalta now at 30 mg   2. Diarrhea -  diarrhea off and on chronically, no abdominal discomfort but dose have nausea, will get labs to rule out electrolytes or infection  -will start florastore twice daily  -will add benefiber daily  -will get cbc and cmp   3. Essential hypertension -stable on current medications   4. Dementia without behavioral disturbance No acute changes in cognitive or functional status  5. Acute maxillary sinusitis, recurrence not specified -will give azithromycin 500 mg daily then 250 daily for 4 days   6. Nausea without vomiting -zofran 4 mg po q 6 PRN

## 2014-01-30 ENCOUNTER — Non-Acute Institutional Stay (SKILLED_NURSING_FACILITY): Payer: Medicare Other | Admitting: Nurse Practitioner

## 2014-01-30 DIAGNOSIS — R110 Nausea: Secondary | ICD-10-CM

## 2014-01-30 DIAGNOSIS — R197 Diarrhea, unspecified: Secondary | ICD-10-CM

## 2014-01-30 DIAGNOSIS — J0100 Acute maxillary sinusitis, unspecified: Secondary | ICD-10-CM

## 2014-01-30 DIAGNOSIS — R634 Abnormal weight loss: Secondary | ICD-10-CM

## 2014-01-30 NOTE — Progress Notes (Signed)
Patient ID: Ebony [name], female   DOB: 07-02-1931, 80 y.o.   MRN: [id number]    Nursing Home Location[facility]  [facility]   Place of Service: SNF (31)  PCP: Harlow AsaLUKING,W S, MD  Allergies  Allergen Reactions  . Penicillins Diarrhea  . Ace Inhibitors   . Biaxin [Clarithromycin] Diarrhea  . Sulfa Antibiotics Diarrhea  . Cozaar [Losartan Potassium] Rash    Chief Complaint  Patient presents with  . Follow-up    HPI:  [id number] year old female being seen today for follow up on sinusitis, diarrhea, nausea and decreased appetite. Pt reports nasal congestion and full feeling in her ear is no better. Has not gotten worse but is not seeing improvement after zpack. No fever or chills. Just feels bad. No shortness of breath. conts to have increase in  nasal congestion, decrease hearing out of her right ear.  Reports diarrhea has improved. Appetite is the same but she forces herself to eat.  Also reporting that she is annoyed she can not get her TV to work right throughout the visit.     Review of Systems:  Review of Systems  Constitutional: Positive for malaise/fatigue. Negative for fever and chills.  HENT: Positive for congestion and hearing loss. Negative for ear discharge, ear pain, nosebleeds, sore throat and tinnitus.   Eyes: Negative for blurred vision.  Respiratory: Positive for cough. Negative for sputum production and shortness of breath.   Cardiovascular: Negative for chest pain, palpitations and leg swelling.  Gastrointestinal: Positive for nausea (comes and goes) and diarrhea (has improved). Negative for heartburn, abdominal pain and blood in stool.  Genitourinary: Negative for dysuria, urgency and frequency.  Musculoskeletal: Negative for myalgias and back pain (at times).  Skin: Negative.   Neurological: Positive for weakness. Negative for dizziness, sensory change, focal weakness and headaches.  Psychiatric/Behavioral: Negative for depression. The patient is  nervous/anxious. Insomnia: trouble sleeping at times.      Past Medical History  Diagnosis Date  . Hypertension   . Subarachnoid hemorrhage following injury 05/16/2011  . Unspecified hypothyroidism 04/15/2012  . SDH (subdural hematoma) 05/16/2011  . Hiatal hernia   . Allergic rhinitis   . Fibrocystic breast disease   . Anxiety   . IFG (impaired fasting glucose)   . Arthritis     Knees  . Hyperlipidemia   . Depression    Past Surgical History  Procedure Laterality Date  . Abdominal hysterectomy    . Breast surgery    . Tonsillectomy    . Eye surgery     Social History:   reports that she has never smoked. She does not have any smokeless tobacco history on file. She reports that she does not drink alcohol or use illicit drugs.  Family History  Problem Relation Age of Onset  . CVA Neg Hx   . Diabetes Neg Hx   . Hypertension Neg Hx   . Lung cancer Sister     Medications: Patient's Medications  New Prescriptions   No medications on file  Previous Medications   ACETAMINOPHEN (TYLENOL) 325 MG TABLET    Take 2 tablets (650 mg total) by mouth every 6 (six) hours as needed.   ALPRAZOLAM (XANAX) 0.25 MG TABLET    Take one tablet by mouth at bedtime to help rest   ALPRAZOLAM (XANAX) 0.5 MG TABLET    Take 0.25 mg by mouth at bedtime. Take one tablet by mouth at bedtime for anxiety   ASPIRIN 81 MG CHEWABLE TABLET  Chew 81 mg by mouth daily.   CETIRIZINE (ZYRTEC) 10 MG TABLET    Take 10 mg by mouth daily.   CRANBERRY 425 MG CAPS    Take by mouth daily.   DILTIAZEM (CARDIZEM CD) 120 MG 24 HR CAPSULE    Take 120 mg by mouth daily. for angina.   DOCUSATE SODIUM (COLACE) 100 MG CAPSULE    Take 100 mg by mouth daily. for  Constipation.   DONEPEZIL (ARICEPT) 5 MG TABLET    Take 10 mg by mouth at bedtime as needed. For memory   DULOXETINE (CYMBALTA) 30 MG CAPSULE    Take 30 mg by mouth daily. for depression.   HYDROCODONE-ACETAMINOPHEN (NORCO/VICODIN) 5-325 MG PER TABLET    Take one  tablet by mouth twice daily as needed for pain   LEVOTHYROXINE (SYNTHROID, LEVOTHROID) 25 MCG TABLET    Take 1 tablet (25 mcg total) by mouth daily before breakfast.   MELATONIN 3 MG CAPS    By mouth qhs for sleep   MEMANTINE (NAMENDA) 10 MG TABLET    Take 10 mg by mouth 2 (two) times daily.   METOPROLOL (TOPROL-XL) 100 MG 24 HR TABLET    Take 100 mg by mouth every morning. for HTN.   MIRTAZAPINE (REMERON) 15 MG TABLET    Take 15 mg by mouth at bedtime.   MULTIPLE VITAMIN (MULITIVITAMIN WITH MINERALS) TABS    Take 1 tablet by mouth every morning.   NAPROXEN SODIUM (ANAPROX) 220 MG TABLET    Take 220 mg by mouth 3 (three) times daily as needed. For pain   PHENAZOPYRIDINE (PYRIDIUM) 100 MG TABLET    Take 100 mg by mouth 3 (three) times daily as needed. for burning on urination or pain.  Modified Medications   No medications on file  Discontinued Medications   No medications on file     Physical Exam:  Filed Vitals:   01/30/14 1126  BP: 146/74  Pulse: 58  Temp: 98.5 F (36.9 C)  Resp: 20  Weight: 134 lb (60.782 kg)   Physical Exam  Constitutional: She is oriented to person, place, and time and well-developed, well-nourished, and in no distress.  HENT:  Head: Normocephalic and atraumatic.  Right Ear: External ear and ear canal normal.  Left Ear: External ear and ear canal normal.  Mouth/Throat: Oropharynx is clear and moist. No oropharyngeal exudate.  Eyes: Conjunctivae and EOM are normal. Pupils are equal, round, and reactive to light.  Neck: Normal range of motion. Neck supple.  Cardiovascular: Normal rate, regular rhythm and normal heart sounds.   Pulmonary/Chest: Effort normal and breath sounds normal.  Abdominal: Soft. Bowel sounds are normal. She exhibits no distension. There is no tenderness.  Musculoskeletal: Normal range of motion. She exhibits no edema or tenderness.  Neurological: She is alert and oriented to person, place, and time.  Skin: Skin is warm and dry.    Psychiatric: Her mood appears anxious. Her affect is labile.  Nursing note and vitals reviewed.    Labs reviewed:  Basic Metabolic Panel  Result: 11/08/2012 4:06 PM ( Status: F )  Sodium 140 135-145 mEq/L SLN  Potassium 3.7 3.5-5.3 mEq/L SLN  Chloride 105 96-112 mEq/L SLN  CO2 28 19-32 mEq/L SLN  Glucose 86 70-99 mg/dL SLN  BUN 16 9-60 mg/dL SLN  Creatinine 4.54 0.98-1.19 mg/dL SLN  Calcium 9.7 1.4-78.2 mg/dL SLN  TSH, Ultrasensitive  Result: 11/08/2012 5:22 PM ( Status: F )  TSH 2.025 0.350-4.500 uIU/mL SLN  Basic  Metabolic Panel  Result: 01/08/2013 4:44 PM ( Status: F )  Sodium 138 135-145 mEq/L SLN  Potassium 4.7 3.5-5.3 mEq/L SLN  Chloride 104 96-112 mEq/L SLN  CO2 28 19-32 mEq/L SLN  Glucose 103 H 70-99 mg/dL SLN  BUN 24 H 1-61 mg/dL SLN  Creatinine 0.96 0.45-4.09 mg/dL SLN  Calcium 9.3 8.1-19.1 mg/dL SLN  TSH, Ultrasensitive  Result: 01/08/2013 5:10 PM ( Status: F )  TSH 2.069  CBC NO Diff (Complete Blood Count)  Result: 04/05/2013 12:15 PM ( Status: F ) C  WBC 12.6 H 4.0-10.5 K/uL SLN  RBC 5.03 3.87-5.11 MIL/uL SLN  Hemoglobin 14.6 12.0-15.0 g/dL SLN  Hematocrit 47.8 29.5-62.1 % SLN  MCV 85.3 78.0-100.0 fL SLN  MCH 29.0 26.0-34.0 pg SLN  MCHC 34.0 30.0-36.0 g/dL SLN  RDW 30.8 65.7-84.6 % SLN  Platelet Count 296 150-400 K/uL SLN  Lipid Profile  Result: 04/05/2013 12:06 PM ( Status: F )  Cholesterol 163 0-200 mg/dL SLN C  Triglyceride 962 H <150 mg/dL SLN  HDL Cholesterol 53 >39 mg/dL SLN  Total Chol/HDL Ratio 3.1 Ratio SLN  VLDL Cholesterol (Calc) 35 9-52 mg/dL SLN  LDL Cholesterol (Calc) 75  SH, Ultrasensitive  Result: 04/08/2013 7:24 PM ( Status: F ) C  TSH 1.481 0.350-4.500 UIU/mL   Comprehensive Metabolic Panel  Result: 06/07/2013 5:01 PM ( Status: F ) C  Sodium 138 135-145 mEq/L SLN  Potassium 3.5 3.5-5.3 mEq/L SLN  Chloride 103 96-112 mEq/L SLN  CO2 25 19-32 mEq/L SLN  Glucose 90 70-99 mg/dL SLN  BUN 12 8-41 mg/dL SLN  Creatinine 3.24 4.01-0.27 mg/dL SLN   Bilirubin, Total 0.4 0.2-1.2 mg/dL SLN  Alkaline Phosphatase 83 39-117 U/L SLN  AST/SGOT 19 0-37 U/L SLN  ALT/SGPT 14 0-35 U/L SLN  Total Protein 5.4 L 6.0-8.3 g/dL SLN  Albumin 3.1 L 2.5-3.6 g/dL SLN  Calcium 8.6 6.4-40.3 mg/dL SLN   Basic Metabolic Panel    Result: 08/07/2013 4:12 PM   ( Status: F )       Sodium 144     135-145 mEq/L SLN   Potassium 5.0     3.5-5.3 mEq/L SLN   Chloride 101     96-112 mEq/L SLN   CO2 30     19-32 mEq/L SLN   Glucose 78     70-99 mg/dL SLN   BUN 10     4-74 mg/dL SLN   Creatinine 2.59     0.50-1.10 mg/dL SLN   Calcium 9.0     5.6-38.7 mg/dL SLN   Basic Metabolic Panel    Result: 11/07/2013 4:24 PM   ( Status: F )       Sodium 142     135-145 mEq/L SLN   Potassium 3.4   L 3.5-5.3 mEq/L SLN   Chloride 106     96-112 mEq/L SLN   CO2 29     19-32 mEq/L SLN   Glucose 85     70-99 mg/dL SLN   BUN 10     5-64 mg/dL SLN   Creatinine 3.32     0.50-1.10 mg/dL SLN   Calcium 9.3     9.5-18.8 mg/dL SLN CBC NO Diff (Complete Blood Count)    Result: 01/17/2014 3:36 PM   ( Status: F )       WBC 9.7     4.0-10.5 K/uL SLN   RBC 4.48     3.87-5.11 MIL/uL SLN   Hemoglobin 13.1     12.0-15.0 g/dL SLN  Hematocrit 39.1     36.0-46.0 % SLN   MCV 87.3     78.0-100.0 fL SLN   MCH 29.2     26.0-34.0 pg SLN   MCHC 33.5     30.0-36.0 g/dL SLN   RDW 54.014.0     98.1-19.111.5-15.5 % SLN   Platelet Count 274     150-400 K/uL SLN   MPV 9.4     9.4-12.4 fL SLN   Comprehensive Metabolic Panel    Result: 01/17/2014 4:11 PM   ( Status: F )       Sodium 142     135-145 mEq/L SLN   Potassium 4.3     3.5-5.3 mEq/L SLN   Chloride 101     96-112 mEq/L SLN   CO2 30     19-32 mEq/L SLN   Glucose 94     70-99 mg/dL SLN   BUN 9     4-786-23 mg/dL SLN   Creatinine 2.950.70     0.50-1.10 mg/dL SLN   Bilirubin, Total 0.3     0.2-1.2 mg/dL SLN   Alkaline Phosphatase 119   H 39-117 U/L SLN   AST/SGOT 21     0-37 U/L SLN   ALT/SGPT 15     0-35 U/L SLN   Total Protein 5.9   L 6.0-8.3 g/dL SLN    Albumin 3.3   L 3.5-5.2 g/dL SLN   Calcium 9.2     6.2-13.08.4-10.5 mg/dL SLN   Assessment/Plan  1. Diarrhea Improved, however will increase bene-fiber to twice daily due to adding Levaquin and potential for worsen of symptoms.   2. Acute maxillary sinusitis, recurrence not specified zpack unsuccessful Will give Levaquin 500 mg daily for 5 days  Cont on probiotics and bene fiber due to potential to cause worsening of diarrhea  3. Nausea without vomiting -improved  4. Loss of weight Will cont to monitor. Weight remains stable despite decrease in appetite. Pt reports she is attempting to eat even when she is not that hungry

## 2014-02-09 ENCOUNTER — Non-Acute Institutional Stay (SKILLED_NURSING_FACILITY): Payer: Medicare Other | Admitting: Nurse Practitioner

## 2014-02-09 DIAGNOSIS — R197 Diarrhea, unspecified: Secondary | ICD-10-CM

## 2014-02-09 DIAGNOSIS — I10 Essential (primary) hypertension: Secondary | ICD-10-CM

## 2014-02-09 DIAGNOSIS — J0100 Acute maxillary sinusitis, unspecified: Secondary | ICD-10-CM

## 2014-02-09 DIAGNOSIS — F329 Major depressive disorder, single episode, unspecified: Secondary | ICD-10-CM

## 2014-02-09 DIAGNOSIS — I4891 Unspecified atrial fibrillation: Secondary | ICD-10-CM

## 2014-02-09 DIAGNOSIS — G4700 Insomnia, unspecified: Secondary | ICD-10-CM

## 2014-02-09 DIAGNOSIS — F32A Depression, unspecified: Secondary | ICD-10-CM

## 2014-02-09 DIAGNOSIS — R634 Abnormal weight loss: Secondary | ICD-10-CM

## 2014-02-09 DIAGNOSIS — M159 Polyosteoarthritis, unspecified: Secondary | ICD-10-CM

## 2014-02-09 NOTE — Progress Notes (Signed)
Patient ID: Ebony [name], female   DOB: 1931-10-11, 80 y.o.   MRN: [id number]    Nursing Home Location:  [facility]   Place of Service: SNF (31)  PCP: Harlow Asa, MD  Allergies  Allergen Reactions  . Penicillins Diarrhea  . Ace Inhibitors   . Biaxin [Clarithromycin] Diarrhea  . Sulfa Antibiotics Diarrhea  . Cozaar [Losartan Potassium] Rash    Chief Complaint  Patient presents with  . Medical Management of Chronic Issues    HPI:  79 year old female being seen today for follow up on sinusitis, diarrhea, nausea and decreased appetite and follow up on chronic conditons. Pt reports nasal congestion and full feeling in her ear has improved, as well as diarrhea, Nausea and she is eating better. Then she reports she cant wait until christmas is over. Reports not seeing her family much and that upsets her. She has not gotten out of the facility much like she has in the past. Reports her family "does not have time"  Had increased her activity. Reports going to more programs offered at [facility]. Eating in dining room again and uses walker.    Review of Systems:  Review of Systems  Constitutional: Negative for fever, chills and malaise/fatigue.  HENT: Negative for congestion, ear discharge, ear pain, hearing loss, nosebleeds, sore throat and tinnitus.   Eyes: Negative for blurred vision.  Respiratory: Negative for cough, sputum production and shortness of breath.   Cardiovascular: Negative for chest pain, palpitations and leg swelling.  Gastrointestinal: Negative for heartburn, nausea, abdominal pain, diarrhea (has improved) and blood in stool.  Genitourinary: Negative for dysuria, urgency and frequency.  Musculoskeletal: Negative for myalgias and back pain (at times).  Skin: Negative.   Neurological: Negative for dizziness, sensory change, focal weakness, weakness and headaches.  Psychiatric/Behavioral: Negative for depression. The patient is nervous/anxious and has  insomnia (trouble sleeping at times).      Past Medical History  Diagnosis Date  . Hypertension   . Subarachnoid hemorrhage following injury 05/16/2011  . Unspecified hypothyroidism 04/15/2012  . SDH (subdural hematoma) 05/16/2011  . Hiatal hernia   . Allergic rhinitis   . Fibrocystic breast disease   . Anxiety   . IFG (impaired fasting glucose)   . Arthritis     Knees  . Hyperlipidemia   . Depression    Past Surgical History  Procedure Laterality Date  . Abdominal hysterectomy    . Breast surgery    . Tonsillectomy    . Eye surgery     Social History:   reports that she has never smoked. She does not have any smokeless tobacco history on file. She reports that she does not drink alcohol or use illicit drugs.  Family History  Problem Relation Age of Onset  . CVA Neg Hx   . Diabetes Neg Hx   . Hypertension Neg Hx   . Lung cancer Sister     Medications: Patient's Medications  New Prescriptions   No medications on file  Previous Medications   ACETAMINOPHEN (TYLENOL) 325 MG TABLET    Take 2 tablets (650 mg total) by mouth every 6 (six) hours as needed.   ALPRAZOLAM (XANAX) 0.25 MG TABLET    Take one tablet by mouth at bedtime to help rest   ALPRAZOLAM (XANAX) 0.5 MG TABLET    Take 0.25 mg by mouth at bedtime. Take one tablet by mouth at bedtime for anxiety   ASPIRIN 81 MG CHEWABLE TABLET  Chew 81 mg by mouth daily.   CETIRIZINE (ZYRTEC) 10 MG TABLET    Take 10 mg by mouth daily.   CRANBERRY 425 MG CAPS    Take by mouth daily.   DILTIAZEM (CARDIZEM CD) 120 MG 24 HR CAPSULE    Take 120 mg by mouth daily. for angina.   DOCUSATE SODIUM (COLACE) 100 MG CAPSULE    Take 100 mg by mouth daily. for  Constipation.   DONEPEZIL (ARICEPT) 5 MG TABLET    Take 10 mg by mouth at bedtime as needed. For memory   DULOXETINE (CYMBALTA) 30 MG CAPSULE    Take 30 mg by mouth daily. for depression.   HYDROCODONE-ACETAMINOPHEN (NORCO/VICODIN) 5-325 MG PER TABLET    Take one tablet by mouth  twice daily as needed for pain   LEVOTHYROXINE (SYNTHROID, LEVOTHROID) 25 MCG TABLET    Take 1 tablet (25 mcg total) by mouth daily before breakfast.   MELATONIN 3 MG CAPS    By mouth qhs for sleep   MEMANTINE (NAMENDA) 10 MG TABLET    Take 10 mg by mouth 2 (two) times daily.   METOPROLOL (TOPROL-XL) 100 MG 24 HR TABLET    Take 100 mg by mouth every morning. for HTN.   MIRTAZAPINE (REMERON) 15 MG TABLET    Take 15 mg by mouth at bedtime.   MULTIPLE VITAMIN (MULITIVITAMIN WITH MINERALS) TABS    Take 1 tablet by mouth every morning.   NAPROXEN SODIUM (ANAPROX) 220 MG TABLET    Take 220 mg by mouth 3 (three) times daily as needed. For pain   PHENAZOPYRIDINE (PYRIDIUM) 100 MG TABLET    Take 100 mg by mouth 3 (three) times daily as needed. for burning on urination or pain.  Modified Medications   No medications on file  Discontinued Medications   No medications on file     Physical Exam:  Filed Vitals:   02/09/14 1526  BP: 132/79  Pulse: 60  Temp: 97 F (36.1 C)  Resp: 18  Weight: 137 lb (62.143 kg)   Physical Exam  Constitutional: She is oriented to person, place, and time and well-developed, well-nourished, and in no distress.  HENT:  Head: Normocephalic and atraumatic.  Right Ear: External ear and ear canal normal.  Left Ear: External ear and ear canal normal.  Mouth/Throat: Oropharynx is clear and moist. No oropharyngeal exudate.  Eyes: Conjunctivae and EOM are normal. Pupils are equal, round, and reactive to light.  Neck: Normal range of motion. Neck supple.  Cardiovascular: Normal rate, regular rhythm and normal heart sounds.   Pulmonary/Chest: Effort normal and breath sounds normal.  Abdominal: Soft. Bowel sounds are normal. She exhibits no distension. There is no tenderness.  Musculoskeletal: Normal range of motion. She exhibits no edema or tenderness.  Neurological: She is alert and oriented to person, place, and time.  Skin: Skin is warm and dry.  Psychiatric: Her mood  appears anxious. Her affect is labile.  Nursing note and vitals reviewed.    Labs reviewed:  Basic Metabolic Panel  Result: 11/08/2012 4:06 PM ( Status: F )  Sodium 140 135-145 mEq/L SLN  Potassium 3.7 3.5-5.3 mEq/L SLN  Chloride 105 96-112 mEq/L SLN  CO2 28 19-32 mEq/L SLN  Glucose 86 70-99 mg/dL SLN  BUN 16 2-956-23 mg/dL SLN  Creatinine 6.210.66 3.08-6.570.50-1.10 mg/dL SLN  Calcium 9.7 8.4-69.68.4-10.5 mg/dL SLN  TSH, Ultrasensitive  Result: 11/08/2012 5:22 PM ( Status: F )  TSH 2.025 0.350-4.500 uIU/mL SLN  Basic Metabolic  Panel  Result: 01/08/2013 4:44 PM ( Status: F )  Sodium 138 135-145 mEq/L SLN  Potassium 4.7 3.5-5.3 mEq/L SLN  Chloride 104 96-112 mEq/L SLN  CO2 28 19-32 mEq/L SLN  Glucose 103 H 70-99 mg/dL SLN  BUN 24 H 1-61 mg/dL SLN  Creatinine 0.96 0.45-4.09 mg/dL SLN  Calcium 9.3 8.1-19.1 mg/dL SLN  TSH, Ultrasensitive  Result: 01/08/2013 5:10 PM ( Status: F )  TSH 2.069  CBC NO Diff (Complete Blood Count)  Result: 04/05/2013 12:15 PM ( Status: F ) C  WBC 12.6 H 4.0-10.5 K/uL SLN  RBC 5.03 3.87-5.11 MIL/uL SLN  Hemoglobin 14.6 12.0-15.0 g/dL SLN  Hematocrit 47.8 29.5-62.1 % SLN  MCV 85.3 78.0-100.0 fL SLN  MCH 29.0 26.0-34.0 pg SLN  MCHC 34.0 30.0-36.0 g/dL SLN  RDW 30.8 65.7-84.6 % SLN  Platelet Count 296 150-400 K/uL SLN  Lipid Profile  Result: 04/05/2013 12:06 PM ( Status: F )  Cholesterol 163 0-200 mg/dL SLN C  Triglyceride 962 H <150 mg/dL SLN  HDL Cholesterol 53 >39 mg/dL SLN  Total Chol/HDL Ratio 3.1 Ratio SLN  VLDL Cholesterol (Calc) 35 9-52 mg/dL SLN  LDL Cholesterol (Calc) 75  SH, Ultrasensitive  Result: 04/08/2013 7:24 PM ( Status: F ) C  TSH 1.481 0.350-4.500 UIU/mL   Comprehensive Metabolic Panel  Result: 06/07/2013 5:01 PM ( Status: F ) C  Sodium 138 135-145 mEq/L SLN  Potassium 3.5 3.5-5.3 mEq/L SLN  Chloride 103 96-112 mEq/L SLN  CO2 25 19-32 mEq/L SLN  Glucose 90 70-99 mg/dL SLN  BUN 12 8-41 mg/dL SLN  Creatinine 3.24 4.01-0.27 mg/dL SLN  Bilirubin, Total 0.4  0.2-1.2 mg/dL SLN  Alkaline Phosphatase 83 39-117 U/L SLN  AST/SGOT 19 0-37 U/L SLN  ALT/SGPT 14 0-35 U/L SLN  Total Protein 5.4 L 6.0-8.3 g/dL SLN  Albumin 3.1 L 2.5-3.6 g/dL SLN  Calcium 8.6 6.4-40.3 mg/dL SLN   Basic Metabolic Panel    Result: 08/07/2013 4:12 PM   ( Status: F )       Sodium 144     135-145 mEq/L SLN   Potassium 5.0     3.5-5.3 mEq/L SLN   Chloride 101     96-112 mEq/L SLN   CO2 30     19-32 mEq/L SLN   Glucose 78     70-99 mg/dL SLN   BUN 10     4-74 mg/dL SLN   Creatinine 2.59     0.50-1.10 mg/dL SLN   Calcium 9.0     5.6-38.7 mg/dL SLN   Basic Metabolic Panel    Result: 11/07/2013 4:24 PM   ( Status: F )       Sodium 142     135-145 mEq/L SLN   Potassium 3.4   L 3.5-5.3 mEq/L SLN   Chloride 106     96-112 mEq/L SLN   CO2 29     19-32 mEq/L SLN   Glucose 85     70-99 mg/dL SLN   BUN 10     5-64 mg/dL SLN   Creatinine 3.32     0.50-1.10 mg/dL SLN   Calcium 9.3     9.5-18.8 mg/dL SLN CBC NO Diff (Complete Blood Count)    Result: 01/17/2014 3:36 PM   ( Status: F )       WBC 9.7     4.0-10.5 K/uL SLN   RBC 4.48     3.87-5.11 MIL/uL SLN   Hemoglobin 13.1     12.0-15.0 g/dL SLN  Hematocrit 39.1     36.0-46.0 % SLN   MCV 87.3     78.0-100.0 fL SLN   MCH 29.2     26.0-34.0 pg SLN   MCHC 33.5     30.0-36.0 g/dL SLN   RDW 16.114.0     09.6-04.511.5-15.5 % SLN   Platelet Count 274     150-400 K/uL SLN   MPV 9.4     9.4-12.4 fL SLN   Comprehensive Metabolic Panel    Result: 01/17/2014 4:11 PM   ( Status: F )       Sodium 142     135-145 mEq/L SLN   Potassium 4.3     3.5-5.3 mEq/L SLN   Chloride 101     96-112 mEq/L SLN   CO2 30     19-32 mEq/L SLN   Glucose 94     70-99 mg/dL SLN   BUN 9     4-096-23 mg/dL SLN   Creatinine 8.110.70     0.50-1.10 mg/dL SLN   Bilirubin, Total 0.3     0.2-1.2 mg/dL SLN   Alkaline Phosphatase 119   H 39-117 U/L SLN   AST/SGOT 21     0-37 U/L SLN   ALT/SGPT 15     0-35 U/L SLN   Total Protein 5.9   L 6.0-8.3 g/dL SLN   Albumin 3.3    L 3.5-5.2 g/dL SLN   Calcium 9.2     9.1-47.88.4-10.5 mg/dL SLN   Assessment/Plan  1. Osteoarthritis of multiple joints, unspecified osteoarthritis type Reports occasional pains but overall much improved, conts to use Vicodin as needed   2. Loss of weight Has improved, pt weight is back to 137 lbs  3. Depression -seems to be more depressed anxious and easily agitated. Will increase Cymbalta to 60 mg daily to help with symptoms  4. Insomnia -encouraged pt to cont to increase activity during the day and not to take evening naps. Pt agrees and will try to get out of her room more to help with sleeping at night  5. Atrial fibrillation with rapid ventricular response Rate controlled, conts on ASA only due to fall risk   6. Essential hypertension Stable at this time  7. Acute maxillary sinusitis, recurrence not specified -improved  8. Diarrhea -resolved

## 2014-03-16 ENCOUNTER — Non-Acute Institutional Stay (SKILLED_NURSING_FACILITY): Payer: Medicare Other | Admitting: Nurse Practitioner

## 2014-03-16 DIAGNOSIS — F411 Generalized anxiety disorder: Secondary | ICD-10-CM

## 2014-03-16 DIAGNOSIS — I10 Essential (primary) hypertension: Secondary | ICD-10-CM

## 2014-03-16 DIAGNOSIS — G4700 Insomnia, unspecified: Secondary | ICD-10-CM

## 2014-03-16 DIAGNOSIS — I482 Chronic atrial fibrillation, unspecified: Secondary | ICD-10-CM

## 2014-03-16 DIAGNOSIS — E038 Other specified hypothyroidism: Secondary | ICD-10-CM

## 2014-03-16 DIAGNOSIS — F0150 Vascular dementia without behavioral disturbance: Secondary | ICD-10-CM

## 2014-03-16 DIAGNOSIS — M159 Polyosteoarthritis, unspecified: Secondary | ICD-10-CM

## 2014-03-16 DIAGNOSIS — R197 Diarrhea, unspecified: Secondary | ICD-10-CM

## 2014-03-16 DIAGNOSIS — R634 Abnormal weight loss: Secondary | ICD-10-CM

## 2014-03-16 DIAGNOSIS — E034 Atrophy of thyroid (acquired): Secondary | ICD-10-CM

## 2014-03-16 NOTE — Progress Notes (Signed)
Patient ID: Ebony [name], female   DOB: 1931/05/22, 81 y.o.   MRN: [id number]    Nursing Home Location:  [facility]   Place of Service: SNF (31)  PCP: Harlow Asa, MD  Allergies  Allergen Reactions  . Penicillins Diarrhea  . Ace Inhibitors   . Biaxin [Clarithromycin] Diarrhea  . Sulfa Antibiotics Diarrhea  . Cozaar [Losartan Potassium] Rash    Chief Complaint  Patient presents with  . Medical Management of Chronic Issues    HPI:  81 y.o. female being seen today for routine follow up on chronic conditions. Pt with pmh of HTN, hypothyroid, TIA, anxiety, OA, hyperlipidemia,weight loss. In the last month or so pt has has sinusitis, decreased appetite and diarrhea. Overall pt reports she is much improved. Sinusitis has resolved, appetite better, no diarrhea. Getting out of her room more and getting involved with activities. Pt without any complaints. Pt reports she is not taking her synthroid however staff reports she has been.  Review of Systems:  Review of Systems  Constitutional: Negative for fever, chills and weight loss.  HENT: Negative for tinnitus.   Respiratory: Negative for cough, sputum production and shortness of breath.   Cardiovascular: Negative for chest pain, palpitations and leg swelling.  Gastrointestinal: Negative for heartburn, abdominal pain, diarrhea and constipation.  Genitourinary: Negative for dysuria, urgency and frequency.  Musculoskeletal: Positive for myalgias and back pain. Negative for joint pain and falls.  Skin: Negative.   Neurological: Negative for dizziness and headaches.  Psychiatric/Behavioral: Positive for memory loss. Negative for depression. The patient is nervous/anxious. The patient does not have insomnia.      Past Medical History  Diagnosis Date  . Hypertension   . Subarachnoid hemorrhage following injury 05/16/2011  . Unspecified hypothyroidism 04/15/2012  . SDH (subdural hematoma) 05/16/2011  . Hiatal hernia   .  Allergic rhinitis   . Fibrocystic breast disease   . Anxiety   . IFG (impaired fasting glucose)   . Arthritis     Knees  . Hyperlipidemia   . Depression    Past Surgical History  Procedure Laterality Date  . Abdominal hysterectomy    . Breast surgery    . Tonsillectomy    . Eye surgery     Social History:   reports that she has never smoked. She does not have any smokeless tobacco history on file. She reports that she does not drink alcohol or use illicit drugs.  Family History  Problem Relation Age of Onset  . CVA Neg Hx   . Diabetes Neg Hx   . Hypertension Neg Hx   . Lung cancer Sister     Medications: Patient's Medications  New Prescriptions   No medications on file  Previous Medications   ACETAMINOPHEN (TYLENOL) 325 MG TABLET    Take 2 tablets (650 mg total) by mouth every 6 (six) hours as needed.   ALPRAZOLAM (XANAX) 0.25 MG TABLET    Take one tablet by mouth at bedtime to help rest   ALPRAZOLAM (XANAX) 0.5 MG TABLET    Take 0.25 mg by mouth at bedtime. Take one tablet by mouth at bedtime for anxiety   ASPIRIN 81 MG CHEWABLE TABLET    Chew 81 mg by mouth daily.   CETIRIZINE (ZYRTEC) 10 MG TABLET    Take 10 mg by mouth daily.   CRANBERRY 425 MG CAPS    Take by mouth daily.   DILTIAZEM (CARDIZEM CD) 120 MG 24 HR CAPSULE  Take 120 mg by mouth daily. for angina.   DOCUSATE SODIUM (COLACE) 100 MG CAPSULE    Take 100 mg by mouth daily. for  Constipation.   DONEPEZIL (ARICEPT) 5 MG TABLET    Take 10 mg by mouth at bedtime as needed. For memory   DULOXETINE (CYMBALTA) 30 MG CAPSULE    Take 30 mg by mouth daily. for depression.   HYDROCODONE-ACETAMINOPHEN (NORCO/VICODIN) 5-325 MG PER TABLET    Take one tablet by mouth twice daily as needed for pain   LEVOTHYROXINE (SYNTHROID, LEVOTHROID) 25 MCG TABLET    Take 1 tablet (25 mcg total) by mouth daily before breakfast.   MELATONIN 3 MG CAPS    By mouth qhs for sleep   MEMANTINE (NAMENDA) 10 MG TABLET    Take 10 mg by mouth 2  (two) times daily.   METOPROLOL (TOPROL-XL) 100 MG 24 HR TABLET    Take 100 mg by mouth every morning. for HTN.   MIRTAZAPINE (REMERON) 15 MG TABLET    Take 15 mg by mouth at bedtime.   MULTIPLE VITAMIN (MULITIVITAMIN WITH MINERALS) TABS    Take 1 tablet by mouth every morning.   NAPROXEN SODIUM (ANAPROX) 220 MG TABLET    Take 220 mg by mouth 3 (three) times daily as needed. For pain   PHENAZOPYRIDINE (PYRIDIUM) 100 MG TABLET    Take 100 mg by mouth 3 (three) times daily as needed. for burning on urination or pain.  Modified Medications   No medications on file  Discontinued Medications   No medications on file     Physical Exam:  Filed Vitals:   03/16/14 1553  BP: 134/72  Pulse: 69  Temp: 97.5 F (36.4 C)  Resp: 18  Weight: 135 lb (61.236 kg)   Physical Exam  Constitutional: She is oriented to person, place, and time and well-developed, well-nourished, and in no distress.  HENT:  Head: Normocephalic and atraumatic.  Right Ear: External ear and ear canal normal.  Left Ear: External ear and ear canal normal.  Mouth/Throat: Oropharynx is clear and moist. No oropharyngeal exudate.  Eyes: Conjunctivae and EOM are normal. Pupils are equal, round, and reactive to light.  Neck: Normal range of motion. Neck supple.  Cardiovascular: Normal rate, regular rhythm and normal heart sounds.   Pulmonary/Chest: Effort normal and breath sounds normal.  Abdominal: Soft. Bowel sounds are normal. She exhibits no distension. There is no tenderness.  Musculoskeletal: Normal range of motion. She exhibits no edema or tenderness.  Neurological: She is alert and oriented to person, place, and time.  Skin: Skin is warm and dry.  Psychiatric: Affect normal.     Labs reviewed:  Basic Metabolic Panel  Result: 11/08/2012 4:06 PM ( Status: F )  Sodium 140 135-145 mEq/L SLN  Potassium 3.7 3.5-5.3 mEq/L SLN  Chloride 105 96-112 mEq/L SLN  CO2 28 19-32 mEq/L SLN  Glucose 86 70-99 mg/dL SLN  BUN 16 1-61  mg/dL SLN  Creatinine 0.96 0.45-4.09 mg/dL SLN  Calcium 9.7 8.1-19.1 mg/dL SLN  TSH, Ultrasensitive  Result: 11/08/2012 5:22 PM ( Status: F )  TSH 2.025 0.350-4.500 uIU/mL SLN  Basic Metabolic Panel  Result: 01/08/2013 4:44 PM ( Status: F )  Sodium 138 135-145 mEq/L SLN  Potassium 4.7 3.5-5.3 mEq/L SLN  Chloride 104 96-112 mEq/L SLN  CO2 28 19-32 mEq/L SLN  Glucose 103 H 70-99 mg/dL SLN  BUN 24 H 4-78 mg/dL SLN  Creatinine 2.95 6.21-3.08 mg/dL SLN  Calcium 9.3 6.5-78.4 mg/dL  SLN  TSH, Ultrasensitive  Result: 01/08/2013 5:10 PM ( Status: F )  TSH 2.069  CBC NO Diff (Complete Blood Count)  Result: 04/05/2013 12:15 PM ( Status: F ) C  WBC 12.6 H 4.0-10.5 K/uL SLN  RBC 5.03 3.87-5.11 MIL/uL SLN  Hemoglobin 14.6 12.0-15.0 g/dL SLN  Hematocrit 16.142.9 09.6-04.536.0-46.0 % SLN  MCV 85.3 78.0-100.0 fL SLN  MCH 29.0 26.0-34.0 pg SLN  MCHC 34.0 30.0-36.0 g/dL SLN  RDW 40.914.7 81.1-91.411.5-15.5 % SLN  Platelet Count 296 150-400 K/uL SLN  Lipid Profile  Result: 04/05/2013 12:06 PM ( Status: F )  Cholesterol 163 0-200 mg/dL SLN C  Triglyceride 782176 H <150 mg/dL SLN  HDL Cholesterol 53 >39 mg/dL SLN  Total Chol/HDL Ratio 3.1 Ratio SLN  VLDL Cholesterol (Calc) 35 9-560-40 mg/dL SLN  LDL Cholesterol (Calc) 75  SH, Ultrasensitive  Result: 04/08/2013 7:24 PM ( Status: F ) C  TSH 1.481 0.350-4.500 UIU/mL   Comprehensive Metabolic Panel  Result: 06/07/2013 5:01 PM ( Status: F ) C  Sodium 138 135-145 mEq/L SLN  Potassium 3.5 3.5-5.3 mEq/L SLN  Chloride 103 96-112 mEq/L SLN  CO2 25 19-32 mEq/L SLN  Glucose 90 70-99 mg/dL SLN  BUN 12 2-136-23 mg/dL SLN  Creatinine 0.860.68 5.78-4.690.50-1.10 mg/dL SLN  Bilirubin, Total 0.4 0.2-1.2 mg/dL SLN  Alkaline Phosphatase 83 39-117 U/L SLN  AST/SGOT 19 0-37 U/L SLN  ALT/SGPT 14 0-35 U/L SLN  Total Protein 5.4 L 6.0-8.3 g/dL SLN  Albumin 3.1 L 6.2-9.53.5-5.2 g/dL SLN  Calcium 8.6 2.8-41.38.4-10.5 mg/dL SLN   Basic Metabolic Panel    Result: 08/07/2013 4:12 PM   ( Status: F )       Sodium 144      135-145 mEq/L SLN   Potassium 5.0     3.5-5.3 mEq/L SLN   Chloride 101     96-112 mEq/L SLN   CO2 30     19-32 mEq/L SLN   Glucose 78     70-99 mg/dL SLN   BUN 10     2-446-23 mg/dL SLN   Creatinine 0.100.67     0.50-1.10 mg/dL SLN   Calcium 9.0     2.7-25.38.4-10.5 mg/dL SLN   Basic Metabolic Panel    Result: 11/07/2013 4:24 PM   ( Status: F )       Sodium 142     135-145 mEq/L SLN   Potassium 3.4   L 3.5-5.3 mEq/L SLN   Chloride 106     96-112 mEq/L SLN   CO2 29     19-32 mEq/L SLN   Glucose 85     70-99 mg/dL SLN   BUN 10     6-646-23 mg/dL SLN   Creatinine 4.030.80     0.50-1.10 mg/dL SLN   Calcium 9.3     4.7-42.58.4-10.5 mg/dL SLN CBC NO Diff (Complete Blood Count)    Result: 01/17/2014 3:36 PM   ( Status: F )       WBC 9.7     4.0-10.5 K/uL SLN   RBC 4.48     3.87-5.11 MIL/uL SLN   Hemoglobin 13.1     12.0-15.0 g/dL SLN   Hematocrit 95.639.1     36.0-46.0 % SLN   MCV 87.3     78.0-100.0 fL SLN   MCH 29.2     26.0-34.0 pg SLN   MCHC 33.5     30.0-36.0 g/dL SLN   RDW 38.714.0     56.4-33.211.5-15.5 % SLN   Platelet Count 274  150-400 K/uL SLN   MPV 9.4     9.4-12.4 fL SLN   Comprehensive Metabolic Panel    Result: 01/17/2014 4:11 PM   ( Status: F )       Sodium 142     135-145 mEq/L SLN   Potassium 4.3     3.5-5.3 mEq/L SLN   Chloride 101     96-112 mEq/L SLN   CO2 30     19-32 mEq/L SLN   Glucose 94     70-99 mg/dL SLN   BUN 9     1-61 mg/dL SLN   Creatinine 0.96     0.50-1.10 mg/dL SLN   Bilirubin, Total 0.3     0.2-1.2 mg/dL SLN   Alkaline Phosphatase 119   H 39-117 U/L SLN   AST/SGOT 21     0-37 U/L SLN   ALT/SGPT 15     0-35 U/L SLN   Total Protein 5.9   L 6.0-8.3 g/dL SLN   Albumin 3.3   L 0.4-5.4 g/dL SLN   Calcium 9.2     0.9-81.1 mg/dL SLN   Assessment/Plan  1. Osteoarthritis of multiple joints, unspecified osteoarthritis type Reports occasional pains but overall much improved, conts to use Vicodin as needed   2. Loss of weight Pt conts on remeron, weight at 135 currently. Eating better at this  time  3. Anxiety and Depression  -overall has improved going to facility activities, has new friend - conts on cymbalta  4. Insomnia -stable  5. Atrial fibrillation with rapid ventricular response Rate controlled on current regimen, conts on ASA only due to fall risk   6. Essential hypertension Patients blood pressure is stable; continue current regimen. Will monitor and make changes as necessary.  7. Diarrhea -resolved, will decrease floratore to daily   8. Dementia -without acute changes in cognitive or functional status -conts on aricept and namenda    9. Hypothyroidism Educated pt on need for medication, will follow up TSH

## 2014-04-05 ENCOUNTER — Non-Acute Institutional Stay (SKILLED_NURSING_FACILITY): Payer: Medicare Other | Admitting: Internal Medicine

## 2014-04-05 DIAGNOSIS — H02843 Edema of right eye, unspecified eyelid: Secondary | ICD-10-CM

## 2014-04-05 NOTE — Progress Notes (Signed)
MRN: [id number] Name: Ebony [name]  Sex: female Age: 81 y.o. DOB: 09-10-1931  PSC #: [name] Facility/Room: 207 Level Of Care: SNF Provider: Merrilee Seashore D Emergency Contacts: Extended Emergency Contact Information Primary Emergency Contact: Coggins,Larry Address: [address]          [place], [place] [address] [place] Home Phone: [phone] Work Phone: [phone] Relation: Son  Code Status: DNR  Allergies: Penicillins; Ace inhibitors; Biaxin; Sulfa antibiotics; and Cozaar  Chief Complaint  Patient presents with  . Acute Visit    HPI: Patient is 81 y.o. female who nursing asked me to see for swelling R eye and R hand onset this am. Both are better, hand is back to normal but still some swelling about R eye.  Past Medical History  Diagnosis Date  . Hypertension   . Subarachnoid hemorrhage following injury 05/16/2011  . Unspecified hypothyroidism 04/15/2012  . SDH (subdural hematoma) 05/16/2011  . Hiatal hernia   . Allergic rhinitis   . Fibrocystic breast disease   . Anxiety   . IFG (impaired fasting glucose)   . Arthritis     Knees  . Hyperlipidemia   . Depression     Past Surgical History  Procedure Laterality Date  . Abdominal hysterectomy    . Breast surgery    . Tonsillectomy    . Eye surgery        Medication List       This list is accurate as of: 04/05/14 11:59 PM.  Always use your most recent med list.               acetaminophen 325 MG tablet  Commonly known as:  TYLENOL  Take 2 tablets (650 mg total) by mouth every 6 (six) hours as needed.     ALPRAZolam 0.5 MG tablet  Commonly known as:  XANAX  Take 0.25 mg by mouth at bedtime. Take one tablet by mouth at bedtime for anxiety     ALPRAZolam 0.25 MG tablet  Commonly known as:  XANAX  Take one tablet by mouth at bedtime to help rest     aspirin 81 MG chewable tablet  Chew 81 mg by mouth daily.     cetirizine 10 MG tablet  Commonly known as:  ZYRTEC  Take  10 mg by mouth daily.     Cranberry 425 MG Caps  Take by mouth daily.     diltiazem 120 MG 24 hr capsule  Commonly known as:  CARDIZEM CD  Take 120 mg by mouth daily. for angina.     docusate sodium 100 MG capsule  Commonly known as:  COLACE  Take 100 mg by mouth daily. for  Constipation.     donepezil 5 MG tablet  Commonly known as:  ARICEPT  Take 10 mg by mouth at bedtime as needed. For memory     DULoxetine 30 MG capsule  Commonly known as:  CYMBALTA  Take 30 mg by mouth daily. for depression.     HYDROcodone-acetaminophen 5-325 MG per tablet  Commonly known as:  NORCO/VICODIN  Take one tablet by mouth twice daily as needed for pain     levothyroxine 25 MCG tablet  Commonly known as:  SYNTHROID, LEVOTHROID  Take 1 tablet (25 mcg total) by mouth daily before breakfast.     Melatonin 3 MG Caps  By mouth qhs for sleep     metoprolol succinate 100 MG 24 hr tablet  Commonly known as:  TOPROL-XL  Take 100  mg by mouth every morning. for HTN.     mirtazapine 15 MG tablet  Commonly known as:  REMERON  Take 15 mg by mouth at bedtime.     multivitamin with minerals Tabs tablet  Take 1 tablet by mouth every morning.     NAMENDA 10 MG tablet  Generic drug:  memantine  Take 10 mg by mouth 2 (two) times daily.     naproxen sodium 220 MG tablet  Commonly known as:  ANAPROX  Take 220 mg by mouth 3 (three) times daily as needed. For pain     phenazopyridine 100 MG tablet  Commonly known as:  PYRIDIUM  Take 100 mg by mouth 3 (three) times daily as needed. for burning on urination or pain.        No orders of the defined types were placed in this encounter.    Immunization History  Administered Date(s) Administered  . H1N1 03/23/2008  . Influenza-Unspecified 01/01/2011, 04/16/2012  . Pneumococcal Polysaccharide-23 01/01/2011  . Pneumococcal-Unspecified 12/01/2011  . Tdap 12/25/2010  . Zoster 11/25/2007    History  Substance Use Topics  . Smoking status: Never  Smoker   . Smokeless tobacco: Not on file  . Alcohol Use: No    Review of Systems  DATA OBTAINED: from patient, nurse GENERAL:  no fevers, fatigue, appetite changes SKIN: No itching, rash HEENT: No complaint RESPIRATORY: No cough, wheezing, SOB CARDIAC: No chest pain, palpitations, lower extremity edema  GI: No abdominal pain, No N/V/D or constipation, No heartburn or reflux  GU: No dysuria, frequency or urgency, or incontinence  MUSCULOSKELETAL: No unrelieved bone/joint pain NEUROLOGIC: No headache, dizziness ;R face does not feel strange, food tasted normal PSYCHIATRIC: No overt anxiety or sadness  Filed Vitals:   04/05/14 2053  BP: 132/76  Pulse: 74  Temp: 98 F (36.7 C)  Resp: 20    Physical Exam  GENERAL APPEARANCE: Alert, conversant, No acute distress  SKIN: No diaphoresis rash HEENT: mild swelling R upper lid and lower lid. There is also some swelling above R eyebrow; no redness to skin or conjunctiva, no d/c; no conjunctival hemmorhage RESPIRATORY: Breathing is even, unlabored. Lung sounds are clear   CARDIOVASCULAR: Heart RRR no murmurs, rubs or gallops. No peripheral edema  GASTROINTESTINAL: Abdomen is soft, non-tender, not distended w/ normal bowel sounds.  GENITOURINARY: Bladder non tender, not distended  MUSCULOSKELETAL: No abnormal joints or musculature, no swelling to hand or arm NEUROLOGIC: Cranial nerves 2-12 grossly intact. In particular R facial strength, lid closure equal to left, tongue is midline; Moves all extremities PSYCHIATRIC: Mood and affect appropriate to situation, no behavioral issues  Patient Active Problem List   Diagnosis Date Noted  . Insomnia 12/07/2013  . Depression   . OA (osteoarthritis) 01/06/2013  . Diarrhea 07/27/2012  . UTI (urinary tract infection) 07/13/2012  . Loss of weight 07/13/2012  . Vascular dementia without behavioral disturbance 06/20/2012  . Hypothyroidism due to acquired atrophy of thyroid 04/15/2012  . Atrial  fibrillation with rapid ventricular response 04/12/2012  . Right wrist pain 04/12/2012  . Right hand pain 04/12/2012  . Acute ischemic stroke 04/11/2012  . Delirium 04/10/2012  . traumatic leg ulcer 04/10/2012  . Hypokalemia 04/10/2012  . Primary lactic acidemia 04/10/2012  . Abnormality of gait 04/10/2012  . Fall 05/16/2011  . SDH (subdural hematoma) 05/16/2011  . Subarachnoid hemorrhage following injury 05/16/2011  . Scalp laceration 05/16/2011  . HTN (hypertension) 05/16/2011  . Anxiety disorder 05/16/2011    CBC  Component Value Date/Time   WBC 8.9 04/13/2012 0527   RBC 3.28* 04/13/2012 0527   HGB 9.7* 04/13/2012 0527   HCT 28.3* 04/13/2012 0527   PLT 187 04/13/2012 0527   MCV 86.3 04/13/2012 0527   LYMPHSABS 1.5 04/09/2012 2136   MONOABS 0.6 04/09/2012 2136   EOSABS 0.0 04/09/2012 2136   BASOSABS 0.0 04/09/2012 2136    CMP     Component Value Date/Time   NA 142 11/07/2013   NA 133* 04/15/2012 0439   K 3.4 11/07/2013   CL 95* 04/15/2012 0439   CO2 27 04/15/2012 0439   GLUCOSE 97 04/15/2012 0439   BUN 10 11/07/2013   BUN 12 04/15/2012 0439   CREATININE 0.8 11/07/2013   CREATININE 0.58 04/15/2012 0439   CALCIUM 9.1 04/15/2012 0439   PROT 6.8 04/10/2012 0458   ALBUMIN 3.6 04/10/2012 0458   AST 66* 04/10/2012 0458   ALT 33 04/10/2012 0458   ALKPHOS 134* 04/10/2012 0458   BILITOT 0.7 04/10/2012 0458   GFRNONAA 86* 04/15/2012 0439   GFRAA >90 04/15/2012 0439    Assessment and Plan  R EYELID SWELLING - this does not appear to be a stroke or Bell's palsy. Does  Not look allergic or infectious. Pt says she sleeps on R side or back so it could have been something happened in the night. I discussed this with pt and she is OK with watchful waiting.  Margit Hanks, MD

## 2014-04-07 ENCOUNTER — Encounter: Payer: Self-pay | Admitting: Internal Medicine

## 2014-04-13 ENCOUNTER — Other Ambulatory Visit: Payer: Self-pay | Admitting: *Deleted

## 2014-04-13 MED ORDER — HYDROCODONE-ACETAMINOPHEN 5-325 MG PO TABS: ORAL_TABLET | ORAL | Status: DC

## 2014-04-13 NOTE — Telephone Encounter (Signed)
[facility] 

## 2014-04-16 ENCOUNTER — Non-Acute Institutional Stay (SKILLED_NURSING_FACILITY): Payer: Medicare Other | Admitting: Nurse Practitioner

## 2014-04-16 DIAGNOSIS — I4891 Unspecified atrial fibrillation: Secondary | ICD-10-CM

## 2014-04-16 DIAGNOSIS — F329 Major depressive disorder, single episode, unspecified: Secondary | ICD-10-CM

## 2014-04-16 DIAGNOSIS — F32A Depression, unspecified: Secondary | ICD-10-CM

## 2014-04-16 DIAGNOSIS — R634 Abnormal weight loss: Secondary | ICD-10-CM

## 2014-04-16 DIAGNOSIS — R197 Diarrhea, unspecified: Secondary | ICD-10-CM

## 2014-04-16 DIAGNOSIS — I10 Essential (primary) hypertension: Secondary | ICD-10-CM

## 2014-04-16 NOTE — Progress Notes (Signed)
This encounter was created in error - please disregard.

## 2014-04-16 NOTE — Progress Notes (Signed)
Patient ID: Ebony [name], female   DOB: November 29, 1931, 81 y.o.   MRN: [id number]    Nursing Home Location:  [facility]   Place of Service: SNF (31)  PCP: Harlow Asa, MD  Allergies  Allergen Reactions  . Penicillins Diarrhea  . Ace Inhibitors   . Biaxin [Clarithromycin] Diarrhea  . Sulfa Antibiotics Diarrhea  . Cozaar [Losartan Potassium] Rash    Chief Complaint  Patient presents with  . Medical Management of Chronic Issues    HPI:  81 y.o. female being seen today for routine follow up on chronic conditions. Pt with pmh of HTN, hypothyroid, TIA, anxiety, OA, hyperlipidemia,weight loss.  Patient appears well with no apparent distress. Pt was seen by Dr Lyn Hollingshead 2 weeks ago due to eye swelling. No complaints of this today.  Patient reports that yesterday she had several episodes of diarrhea that resolved after taking imodium.  Patient feels better today and is going to participate in Bingo activities shortly.    Review of Systems:  Review of Systems  Constitutional: Negative for fever, chills and malaise/fatigue.  HENT: Negative for congestion.   Respiratory: Negative for cough and wheezing.   Cardiovascular: Negative for chest pain, palpitations and leg swelling.  Gastrointestinal: Positive for diarrhea. Negative for nausea and vomiting.  Genitourinary: Negative for dysuria.  Musculoskeletal: Positive for back pain. Negative for myalgias.  Skin: Negative for itching and rash.  Neurological: Negative for dizziness and headaches.  Psychiatric/Behavioral: Negative for depression. The patient is not nervous/anxious.      Past Medical History  Diagnosis Date  . Hypertension   . Subarachnoid hemorrhage following injury 05/16/2011  . Unspecified hypothyroidism 04/15/2012  . SDH (subdural hematoma) 05/16/2011  . Hiatal hernia   . Allergic rhinitis   . Fibrocystic breast disease   . Anxiety   . IFG (impaired fasting glucose)   . Arthritis     Knees  .  Hyperlipidemia   . Depression    Past Surgical History  Procedure Laterality Date  . Abdominal hysterectomy    . Breast surgery    . Tonsillectomy    . Eye surgery     Social History:   reports that she has never smoked. She does not have any smokeless tobacco history on file. She reports that she does not drink alcohol or use illicit drugs.  Family History  Problem Relation Age of Onset  . CVA Neg Hx   . Diabetes Neg Hx   . Hypertension Neg Hx   . Lung cancer Sister     Medications: Patient's Medications  New Prescriptions   No medications on file  Previous Medications   ACETAMINOPHEN (TYLENOL) 325 MG TABLET    Take 2 tablets (650 mg total) by mouth every 6 (six) hours as needed.   ALPRAZOLAM (XANAX) 0.25 MG TABLET    Take one tablet by mouth at bedtime to help rest   ALPRAZOLAM (XANAX) 0.5 MG TABLET    Take 0.25 mg by mouth at bedtime. Take one tablet by mouth at bedtime for anxiety   ASPIRIN 81 MG CHEWABLE TABLET    Chew 81 mg by mouth daily.   CETIRIZINE (ZYRTEC) 10 MG TABLET    Take 10 mg by mouth daily.   CRANBERRY 425 MG CAPS    Take by mouth daily.   DILTIAZEM (CARDIZEM CD) 120 MG 24 HR CAPSULE    Take 120 mg by mouth daily. for angina.   DOCUSATE SODIUM (COLACE) 100 MG CAPSULE  Take 100 mg by mouth daily. for  Constipation.   DONEPEZIL (ARICEPT) 5 MG TABLET    Take 10 mg by mouth at bedtime as needed. For memory   DULOXETINE (CYMBALTA) 30 MG CAPSULE    Take 30 mg by mouth daily. for depression.   HYDROCODONE-ACETAMINOPHEN (NORCO/VICODIN) 5-325 MG PER TABLET    Take one tablet by mouth every 8 hours as needed for pain   LEVOTHYROXINE (SYNTHROID, LEVOTHROID) 25 MCG TABLET    Take 1 tablet (25 mcg total) by mouth daily before breakfast.   MELATONIN 3 MG CAPS    By mouth qhs for sleep   MEMANTINE (NAMENDA) 10 MG TABLET    Take 10 mg by mouth 2 (two) times daily.   METOPROLOL (TOPROL-XL) 100 MG 24 HR TABLET    Take 100 mg by mouth every morning. for HTN.   MIRTAZAPINE  (REMERON) 15 MG TABLET    Take 15 mg by mouth at bedtime.   MULTIPLE VITAMIN (MULITIVITAMIN WITH MINERALS) TABS    Take 1 tablet by mouth every morning.   NAPROXEN SODIUM (ANAPROX) 220 MG TABLET    Take 220 mg by mouth 3 (three) times daily as needed. For pain   PHENAZOPYRIDINE (PYRIDIUM) 100 MG TABLET    Take 100 mg by mouth 3 (three) times daily as needed. for burning on urination or pain.  Modified Medications   No medications on file  Discontinued Medications   No medications on file     Physical Exam:  Filed Vitals:   04/16/14 1554  BP: 127/74  Pulse: 76  Temp: 97.8 F (36.6 C)  TempSrc: Oral  Resp: 19  Weight: 140 lb 12.8 oz (63.866 kg)   Physical Exam  Constitutional: She is oriented to person, place, and time and well-developed, well-nourished, and in no distress.  HENT:  Head: Normocephalic and atraumatic.  Right Ear: Ear canal normal.  Left Ear: Ear canal normal.  Eyes: Conjunctivae are normal. Pupils are equal, round, and reactive to light.  Neck: Normal range of motion. Neck supple.  Cardiovascular: Normal rate, regular rhythm and normal heart sounds.   Pulmonary/Chest: Effort normal and breath sounds normal.  Abdominal: Soft. Bowel sounds are normal. She exhibits no distension. There is no tenderness.  Musculoskeletal: Normal range of motion. She exhibits no edema or tenderness.  Lymphadenopathy:    She has no cervical adenopathy.  Neurological: She is alert and oriented to person, place, and time.  Skin: Skin is warm and dry.  Psychiatric: Affect normal.     Labs reviewed:  Basic Metabolic Panel  Result: 11/08/2012 4:06 PM ( Status: F )  Sodium 140 135-145 mEq/L SLN  Potassium 3.7 3.5-5.3 mEq/L SLN  Chloride 105 96-112 mEq/L SLN  CO2 28 19-32 mEq/L SLN  Glucose 86 70-99 mg/dL SLN  BUN 16 1-616-23 mg/dL SLN  Creatinine 0.960.66 0.45-4.090.50-1.10 mg/dL SLN  Calcium 9.7 8.1-19.18.4-10.5 mg/dL SLN  TSH, Ultrasensitive  Result: 11/08/2012 5:22 PM ( Status: F )  TSH 2.025  0.350-4.500 uIU/mL SLN  Basic Metabolic Panel  Result: 01/08/2013 4:44 PM ( Status: F )  Sodium 138 135-145 mEq/L SLN  Potassium 4.7 3.5-5.3 mEq/L SLN  Chloride 104 96-112 mEq/L SLN  CO2 28 19-32 mEq/L SLN  Glucose 103 H 70-99 mg/dL SLN  BUN 24 H 4-786-23 mg/dL SLN  Creatinine 2.950.68 6.21-3.080.50-1.10 mg/dL SLN  Calcium 9.3 6.5-78.48.4-10.5 mg/dL SLN  TSH, Ultrasensitive  Result: 01/08/2013 5:10 PM ( Status: F )  TSH 2.069  CBC NO Diff (Complete  Blood Count)  Result: 04/05/2013 12:15 PM ( Status: F ) C  WBC 12.6 H 4.0-10.5 K/uL SLN  RBC 5.03 3.87-5.11 MIL/uL SLN  Hemoglobin 14.6 12.0-15.0 g/dL SLN  Hematocrit 16.1 09.6-04.5 % SLN  MCV 85.3 78.0-100.0 fL SLN  MCH 29.0 26.0-34.0 pg SLN  MCHC 34.0 30.0-36.0 g/dL SLN  RDW 40.9 81.1-91.4 % SLN  Platelet Count 296 150-400 K/uL SLN  Lipid Profile  Result: 04/05/2013 12:06 PM ( Status: F )  Cholesterol 163 0-200 mg/dL SLN C  Triglyceride 782 H <150 mg/dL SLN  HDL Cholesterol 53 >39 mg/dL SLN  Total Chol/HDL Ratio 3.1 Ratio SLN  VLDL Cholesterol (Calc) 35 9-56 mg/dL SLN  LDL Cholesterol (Calc) 75  SH, Ultrasensitive  Result: 04/08/2013 7:24 PM ( Status: F ) C  TSH 1.481 0.350-4.500 UIU/mL   Comprehensive Metabolic Panel  Result: 06/07/2013 5:01 PM ( Status: F ) C  Sodium 138 135-145 mEq/L SLN  Potassium 3.5 3.5-5.3 mEq/L SLN  Chloride 103 96-112 mEq/L SLN  CO2 25 19-32 mEq/L SLN  Glucose 90 70-99 mg/dL SLN  BUN 12 2-13 mg/dL SLN  Creatinine 0.86 5.78-4.69 mg/dL SLN  Bilirubin, Total 0.4 0.2-1.2 mg/dL SLN  Alkaline Phosphatase 83 39-117 U/L SLN  AST/SGOT 19 0-37 U/L SLN  ALT/SGPT 14 0-35 U/L SLN  Total Protein 5.4 L 6.0-8.3 g/dL SLN  Albumin 3.1 L 6.2-9.5 g/dL SLN  Calcium 8.6 2.8-41.3 mg/dL SLN   Basic Metabolic Panel    Result: 08/07/2013 4:12 PM   ( Status: F )       Sodium 144     135-145 mEq/L SLN   Potassium 5.0     3.5-5.3 mEq/L SLN   Chloride 101     96-112 mEq/L SLN   CO2 30     19-32 mEq/L SLN   Glucose 78     70-99 mg/dL SLN   BUN 10       2-44 mg/dL SLN   Creatinine 0.10     0.50-1.10 mg/dL SLN   Calcium 9.0     2.7-25.3 mg/dL SLN   Basic Metabolic Panel    Result: 11/07/2013 4:24 PM   ( Status: F )       Sodium 142     135-145 mEq/L SLN   Potassium 3.4   L 3.5-5.3 mEq/L SLN   Chloride 106     96-112 mEq/L SLN   CO2 29     19-32 mEq/L SLN   Glucose 85     70-99 mg/dL SLN   BUN 10     6-64 mg/dL SLN   Creatinine 4.03     0.50-1.10 mg/dL SLN   Calcium 9.3     4.7-42.5 mg/dL SLN CBC NO Diff (Complete Blood Count)    Result: 01/17/2014 3:36 PM   ( Status: F )       WBC 9.7     4.0-10.5 K/uL SLN   RBC 4.48     3.87-5.11 MIL/uL SLN   Hemoglobin 13.1     12.0-15.0 g/dL SLN   Hematocrit 95.6     36.0-46.0 % SLN   MCV 87.3     78.0-100.0 fL SLN   MCH 29.2     26.0-34.0 pg SLN   MCHC 33.5     30.0-36.0 g/dL SLN   RDW 38.7     56.4-33.2 % SLN   Platelet Count 274     150-400 K/uL SLN   MPV 9.4     9.4-12.4 fL SLN  Comprehensive Metabolic Panel    Result: 01/17/2014 4:11 PM   ( Status: F )       Sodium 142     135-145 mEq/L SLN   Potassium 4.3     3.5-5.3 mEq/L SLN   Chloride 101     96-112 mEq/L SLN   CO2 30     19-32 mEq/L SLN   Glucose 94     70-99 mg/dL SLN   BUN 9     1-61 mg/dL SLN   Creatinine 0.96     0.50-1.10 mg/dL SLN   Bilirubin, Total 0.3     0.2-1.2 mg/dL SLN   Alkaline Phosphatase 119   H 39-117 U/L SLN   AST/SGOT 21     0-37 U/L SLN   ALT/SGPT 15     0-35 U/L SLN   Total Protein 5.9   L 6.0-8.3 g/dL SLN   Albumin 3.3   L 0.4-5.4 g/dL SLN   Calcium 9.2     0.9-81.1 mg/dL SLN   Assessment/Plan 1. Essential hypertension Stable on current meds.  Today's blood pressure is within desirable range.  Patient maintains healthy diet.  Bmet up to date.    2. Depression Stable on current med.  Patient participates in bingo, likes to read and has made friends within the facility.  She reports no feelings of depression.   3. Diarrhea Patient had multiple episodes of diarrhea yesterday, resolved with immodium.   She has been able to drink fluids today and eat.  This is a chronic issue for her.  Continue with Immodium as needed.    4. Atrial fibrillation with rapid ventricular response Stable on meds.  Patient reports no palpitations, no shortness of breath.  Rate is regular today.     5. Loss of weight Weight last month was 135.  Gained 5 pounds.   She has been eating healthy and says she has a great appetite.  Will continue to monitor.    6. Hypothyroidism  TSH 2.3 on 1/18.  Continue synthroid.

## 2014-05-08 ENCOUNTER — Non-Acute Institutional Stay (SKILLED_NURSING_FACILITY): Payer: Medicare Other | Admitting: Nurse Practitioner

## 2014-05-08 ENCOUNTER — Encounter: Payer: Self-pay | Admitting: Nurse Practitioner

## 2014-05-08 DIAGNOSIS — E034 Atrophy of thyroid (acquired): Secondary | ICD-10-CM

## 2014-05-08 DIAGNOSIS — F0150 Vascular dementia without behavioral disturbance: Secondary | ICD-10-CM

## 2014-05-08 DIAGNOSIS — I10 Essential (primary) hypertension: Secondary | ICD-10-CM

## 2014-05-08 DIAGNOSIS — E038 Other specified hypothyroidism: Secondary | ICD-10-CM | POA: Diagnosis not present

## 2014-05-08 DIAGNOSIS — M159 Polyosteoarthritis, unspecified: Secondary | ICD-10-CM | POA: Diagnosis not present

## 2014-05-08 DIAGNOSIS — F419 Anxiety disorder, unspecified: Secondary | ICD-10-CM

## 2014-05-08 DIAGNOSIS — M545 Low back pain, unspecified: Secondary | ICD-10-CM

## 2014-05-08 NOTE — Progress Notes (Signed)
Patient ID: Ebony [name], female   DOB: January 27, 1932, 81 y.o.   MRN: [id number]    Nursing Home Location:      Place of Service: SN[facility] (31)  PCP: Harlow AsaLUKING,W S, MD  Allergies  Allergen Reactions  . Penicillins Diarrhea  . Ace Inhibitors   . Biaxin [Clarithromycin] Diarrhea  . Sulfa Antibiotics Diarrhea  . Cozaar [Losartan Potassium] Rash    No chief complaint on file.   HPI:  81 y.o. female being seen today for routine follow up on chronic conditions. Pt with pmh of HTN, hypothyroid, TIA, anxiety, OA, hyperlipidemia,weight loss.  Pt reports she has been to ortho, has chronic disc disease. Reports last year she was taking pain medication 2-3 times a month now needing norco almost daily due to worsening pain. Plan is to obtain MRI but pt reports she does not want surgery. Willing to get injections if they will help. Not having side effects from pain medications. Otherwise doing well. Will have an occasional loose stool but otherwise bowels are normal. Reporting vivid dreams at night that sometimes bother her due to being about family. Also occasionally still bothered by anxiety but not every day.   Review of Systems:  Review of Systems  Constitutional: Negative for fever, chills and weight loss.  HENT: Negative for tinnitus.   Respiratory: Negative for cough, sputum production and shortness of breath.   Cardiovascular: Negative for chest pain, palpitations and leg swelling.  Gastrointestinal: Negative for heartburn, abdominal pain, diarrhea and constipation.  Genitourinary: Negative for dysuria, urgency and frequency.  Musculoskeletal: Positive for back pain. Negative for myalgias, joint pain and falls.  Skin: Negative.   Neurological: Negative for dizziness and headaches.  Psychiatric/Behavioral: Negative for depression and memory loss. The patient is nervous/anxious. The patient does not have insomnia.      Past Medical History  Diagnosis Date  . Hypertension   . Subarachnoid  hemorrhage following injury 05/16/2011  . Unspecified hypothyroidism 04/15/2012  . SDH (subdural hematoma) 05/16/2011  . Hiatal hernia   . Allergic rhinitis   . Fibrocystic breast disease   . Anxiety   . IFG (impaired fasting glucose)   . Arthritis     Knees  . Hyperlipidemia   . Depression   . Subarachnoid hemorrhage following injury 05/16/2011  . Acute ischemic stroke 04/11/2012   Past Surgical History  Procedure Laterality Date  . Abdominal hysterectomy    . Breast surgery    . Tonsillectomy    . Eye surgery     Social History:   reports that she has never smoked. She does not have any smokeless tobacco history on file. She reports that she does not drink alcohol or use illicit drugs.  Family History  Problem Relation Age of Onset  . CVA Neg Hx   . Diabetes Neg Hx   . Hypertension Neg Hx   . Lung cancer Sister     Medications: Patient's Medications  New Prescriptions   No medications on file  Previous Medications   ACETAMINOPHEN (TYLENOL) 325 MG TABLET    Take 2 tablets (650 mg total) by mouth every 6 (six) hours as needed.   ALPRAZOLAM (XANAX) 0.25 MG TABLET    Take one tablet by mouth at bedtime to help rest   ALPRAZOLAM (XANAX) 0.5 MG TABLET    Take 0.25 mg by mouth at bedtime. Take one tablet by mouth at bedtime for anxiety   ASPIRIN 81 MG CHEWABLE TABLET    Chew 81 mg by mouth  daily.   CETIRIZINE (ZYRTEC) 10 MG TABLET    Take 10 mg by mouth daily.   CRANBERRY 425 MG CAPS    Take by mouth daily.   DILTIAZEM (CARDIZEM CD) 120 MG 24 HR CAPSULE    Take 120 mg by mouth daily. for angina.   DOCUSATE SODIUM (COLACE) 100 MG CAPSULE    Take 100 mg by mouth daily. for  Constipation.   DONEPEZIL (ARICEPT) 5 MG TABLET    Take 10 mg by mouth at bedtime as needed. For memory   DULOXETINE (CYMBALTA) 30 MG CAPSULE    Take 30 mg by mouth daily. for depression.   HYDROCODONE-ACETAMINOPHEN (NORCO/VICODIN) 5-325 MG PER TABLET    Take one tablet by mouth every 8 hours as needed for pain    LEVOTHYROXINE (SYNTHROID, LEVOTHROID) 25 MCG TABLET    Take 1 tablet (25 mcg total) by mouth daily before breakfast.   MELATONIN 3 MG CAPS    By mouth qhs for sleep   MEMANTINE (NAMENDA) 10 MG TABLET    Take 10 mg by mouth 2 (two) times daily.   METOPROLOL (TOPROL-XL) 100 MG 24 HR TABLET    Take 100 mg by mouth every morning. for HTN.   MIRTAZAPINE (REMERON) 15 MG TABLET    Take 15 mg by mouth at bedtime.   MULTIPLE VITAMIN (MULITIVITAMIN WITH MINERALS) TABS    Take 1 tablet by mouth every morning.   NAPROXEN SODIUM (ANAPROX) 220 MG TABLET    Take 220 mg by mouth 3 (three) times daily as needed. For pain   PHENAZOPYRIDINE (PYRIDIUM) 100 MG TABLET    Take 100 mg by mouth 3 (three) times daily as needed. for burning on urination or pain.  Modified Medications   No medications on file  Discontinued Medications   No medications on file     Physical Exam:  Filed Vitals:   05/08/14 1406  BP: 119/78  Pulse: 74  Temp: 97 F (36.1 C)  Resp: 20  Weight: 140 lb (63.504 kg)   Physical Exam  Constitutional: She is oriented to person, place, and time and well-developed, well-nourished, and in no distress.  HENT:  Head: Normocephalic and atraumatic.  Right Ear: Ear canal normal.  Left Ear: Ear canal normal.  Eyes: Conjunctivae are normal. Pupils are equal, round, and reactive to light.  Neck: Normal range of motion. Neck supple.  Cardiovascular: Normal rate, regular rhythm and normal heart sounds.   Pulmonary/Chest: Effort normal and breath sounds normal.  Abdominal: Soft. Bowel sounds are normal. She exhibits no distension. There is no tenderness.  Musculoskeletal: Normal range of motion. She exhibits no edema.  Neurological: She is alert and oriented to person, place, and time.  Skin: Skin is warm and dry.  Psychiatric: Affect normal.     Labs reviewed:  Basic Metabolic Panel  Result: 11/08/2012 4:06 PM ( Status: F )  Sodium 140 135-145 mEq/L SLN  Potassium 3.7 3.5-5.3 mEq/L SLN    Chloride 105 96-112 mEq/L SLN  CO2 28 19-32 mEq/L SLN  Glucose 86 70-99 mg/dL SLN  BUN 16 1-61 mg/dL SLN  Creatinine 0.96 0.45-4.09 mg/dL SLN  Calcium 9.7 8.1-19.1 mg/dL SLN  TSH, Ultrasensitive  Result: 11/08/2012 5:22 PM ( Status: F )  TSH 2.025 0.350-4.500 uIU/mL SLN  Basic Metabolic Panel  Result: 01/08/2013 4:44 PM ( Status: F )  Sodium 138 135-145 mEq/L SLN  Potassium 4.7 3.5-5.3 mEq/L SLN  Chloride 104 96-112 mEq/L SLN  CO2 28 19-32 mEq/L SLN  Glucose 103 H 70-99 mg/dL SLN  BUN 24 H 2-44 mg/dL SLN  Creatinine 0.10 2.72-5.36 mg/dL SLN  Calcium 9.3 6.4-40.3 mg/dL SLN  TSH, Ultrasensitive  Result: 01/08/2013 5:10 PM ( Status: F )  TSH 2.069  CBC NO Diff (Complete Blood Count)  Result: 04/05/2013 12:15 PM ( Status: F ) C  WBC 12.6 H 4.0-10.5 K/uL SLN  RBC 5.03 3.87-5.11 MIL/uL SLN  Hemoglobin 14.6 12.0-15.0 g/dL SLN  Hematocrit 47.4 25.9-56.3 % SLN  MCV 85.3 78.0-100.0 fL SLN  MCH 29.0 26.0-34.0 pg SLN  MCHC 34.0 30.0-36.0 g/dL SLN  RDW 87.5 64.3-32.9 % SLN  Platelet Count 296 150-400 K/uL SLN  Lipid Profile  Result: 04/05/2013 12:06 PM ( Status: F )  Cholesterol 163 0-200 mg/dL SLN C  Triglyceride 518 H <150 mg/dL SLN  HDL Cholesterol 53 >39 mg/dL SLN  Total Chol/HDL Ratio 3.1 Ratio SLN  VLDL Cholesterol (Calc) 35 8-41 mg/dL SLN  LDL Cholesterol (Calc) 75  SH, Ultrasensitive  Result: 04/08/2013 7:24 PM ( Status: F ) C  TSH 1.481 0.350-4.500 UIU/mL   Comprehensive Metabolic Panel  Result: 06/07/2013 5:01 PM ( Status: F ) C  Sodium 138 135-145 mEq/L SLN  Potassium 3.5 3.5-5.3 mEq/L SLN  Chloride 103 96-112 mEq/L SLN  CO2 25 19-32 mEq/L SLN  Glucose 90 70-99 mg/dL SLN  BUN 12 6-60 mg/dL SLN  Creatinine 6.30 1.60-1.09 mg/dL SLN  Bilirubin, Total 0.4 0.2-1.2 mg/dL SLN  Alkaline Phosphatase 83 39-117 U/L SLN  AST/SGOT 19 0-37 U/L SLN  ALT/SGPT 14 0-35 U/L SLN  Total Protein 5.4 L 6.0-8.3 g/dL SLN  Albumin 3.1 L 3.2-3.5 g/dL SLN  Calcium 8.6 5.7-32.2 mg/dL SLN   Basic  Metabolic Panel    Result: 08/07/2013 4:12 PM   ( Status: F )       Sodium 144     135-145 mEq/L SLN   Potassium 5.0     3.5-5.3 mEq/L SLN   Chloride 101     96-112 mEq/L SLN   CO2 30     19-32 mEq/L SLN   Glucose 78     70-99 mg/dL SLN   BUN 10     0-25 mg/dL SLN   Creatinine 4.27     0.50-1.10 mg/dL SLN   Calcium 9.0     0.6-23.7 mg/dL SLN   Basic Metabolic Panel    Result: 11/07/2013 4:24 PM   ( Status: F )       Sodium 142     135-145 mEq/L SLN   Potassium 3.4   L 3.5-5.3 mEq/L SLN   Chloride 106     96-112 mEq/L SLN   CO2 29     19-32 mEq/L SLN   Glucose 85     70-99 mg/dL SLN   BUN 10     6-28 mg/dL SLN   Creatinine 3.15     0.50-1.10 mg/dL SLN   Calcium 9.3     1.7-61.6 mg/dL SLN CBC NO Diff (Complete Blood Count)    Result: 01/17/2014 3:36 PM   ( Status: F )       WBC 9.7     4.0-10.5 K/uL SLN   RBC 4.48     3.87-5.11 MIL/uL SLN   Hemoglobin 13.1     12.0-15.0 g/dL SLN   Hematocrit 07.3     36.0-46.0 % SLN   MCV 87.3     78.0-100.0 fL SLN   MCH 29.2     26.0-34.0 pg SLN   MCHC  33.5     30.0-36.0 g/dL SLN   RDW 16.1     09.6-04.5 % SLN   Platelet Count 274     150-400 K/uL SLN   MPV 9.4     9.4-12.4 fL SLN   Comprehensive Metabolic Panel    Result: 01/17/2014 4:11 PM   ( Status: F )       Sodium 142     135-145 mEq/L SLN   Potassium 4.3     3.5-5.3 mEq/L SLN   Chloride 101     96-112 mEq/L SLN   CO2 30     19-32 mEq/L SLN   Glucose 94     70-99 mg/dL SLN   BUN 9     4-09 mg/dL SLN   Creatinine 8.11     0.50-1.10 mg/dL SLN   Bilirubin, Total 0.3     0.2-1.2 mg/dL SLN   Alkaline Phosphatase 119   H 39-117 U/L SLN   AST/SGOT 21     0-37 U/L SLN   ALT/SGPT 15     0-35 U/L SLN   Total Protein 5.9   L 6.0-8.3 g/dL SLN   Albumin 3.3   L 9.1-4.7 g/dL SLN   Calcium 9.2     8.2-95.6 mg/dL SLN   Assessment/Plan  1. Hypothyroidism due to acquired atrophy of thyroid Conts on synthroid 25 mcgs  2. Essential hypertension Blood pressure at goal with current regimen  3.  Vascular dementia without behavioral disturbance -with complaints of vivid dreams, will change aricept to every am instead of bedtime to help with this. -conts on namenda BID  4. Osteoarthritis of multiple joints, unspecified osteoarthritis type -occasional joint pain, has norco PRN  5. Midline low back pain without sciatica Mostly pain from back, ortho following pt and MRI scheduled  6. Anxiety -will cont cymbalta at this time. Would not recommend dose reduction due to ongoing symptoms

## 2014-06-08 ENCOUNTER — Non-Acute Institutional Stay (SKILLED_NURSING_FACILITY): Payer: Medicare Other | Admitting: Nurse Practitioner

## 2014-06-08 DIAGNOSIS — M5136 Other intervertebral disc degeneration, lumbar region: Secondary | ICD-10-CM | POA: Diagnosis not present

## 2014-06-08 DIAGNOSIS — I10 Essential (primary) hypertension: Secondary | ICD-10-CM

## 2014-06-08 DIAGNOSIS — G4700 Insomnia, unspecified: Secondary | ICD-10-CM | POA: Diagnosis not present

## 2014-06-08 DIAGNOSIS — J309 Allergic rhinitis, unspecified: Secondary | ICD-10-CM | POA: Insufficient documentation

## 2014-06-08 DIAGNOSIS — F411 Generalized anxiety disorder: Secondary | ICD-10-CM

## 2014-06-08 DIAGNOSIS — J301 Allergic rhinitis due to pollen: Secondary | ICD-10-CM | POA: Diagnosis not present

## 2014-06-08 MED ORDER — FLUTICASONE PROPIONATE 50 MCG/ACT NA SUSP: 2.0000 | Freq: Every day | NASAL | Status: AC

## 2014-06-08 NOTE — Progress Notes (Signed)
Patient ID: Ebony [name], female   DOB: 02/21/1932, 81 y.o.   MRN: [id number]    Nursing Home Location:  [facility]   Place of Service: SNF (31)  PCP: Harlow Asa, MD  Allergies  Allergen Reactions  . Penicillins Diarrhea  . Ace Inhibitors   . Biaxin [Clarithromycin] Diarrhea  . Sulfa Antibiotics Diarrhea  . Cozaar [Losartan Potassium] Rash    Chief Complaint  Patient presents with  . Medical Management of Chronic Issues    HPI:  81 y.o. female being seen today for routine follow up on chronic conditions. Pt with pmh of HTN, hypothyroid, TIA, anxiety, OA, hyperlipidemia,weight loss. Pt with increased nasal congestion, over the past week. Reports itchy eyes, congested nose, using saline with good effects. No fevers or chills. Hx of allergic rhinitis and currently not on allergy medications.  Reports MRI showed DDD, no additional intervention at this time. Pt conts to use Norco PRN pain with good effects.   Review of Systems:  Review of Systems  Constitutional: Positive for malaise/fatigue. Negative for fever, chills and weight loss.  HENT: Positive for congestion. Negative for tinnitus.   Respiratory: Negative for cough, sputum production and shortness of breath.   Cardiovascular: Negative for chest pain, palpitations and leg swelling.  Gastrointestinal: Negative for heartburn, abdominal pain, diarrhea and constipation.  Genitourinary: Negative for dysuria, urgency and frequency.  Musculoskeletal: Positive for back pain. Negative for myalgias, joint pain and falls.  Skin: Negative.   Neurological: Negative for dizziness and headaches.  Endo/Heme/Allergies: Positive for environmental allergies.  Psychiatric/Behavioral: Negative for depression and memory loss. The patient is nervous/anxious and has insomnia.      Past Medical History  Diagnosis Date  . Hypertension   . Subarachnoid hemorrhage following injury 05/16/2011  . Unspecified hypothyroidism  04/15/2012  . SDH (subdural hematoma) 05/16/2011  . Hiatal hernia   . Allergic rhinitis   . Fibrocystic breast disease   . Anxiety   . IFG (impaired fasting glucose)   . Arthritis     Knees  . Hyperlipidemia   . Depression   . Subarachnoid hemorrhage following injury 05/16/2011  . Acute ischemic stroke 04/11/2012   Past Surgical History  Procedure Laterality Date  . Abdominal hysterectomy    . Breast surgery    . Tonsillectomy    . Eye surgery     Social History:   reports that she has never smoked. She does not have any smokeless tobacco history on file. She reports that she does not drink alcohol or use illicit drugs.  Family History  Problem Relation Age of Onset  . CVA Neg Hx   . Diabetes Neg Hx   . Hypertension Neg Hx   . Lung cancer Sister     Medications: Patient's Medications  New Prescriptions   No medications on file  Previous Medications   ACETAMINOPHEN (TYLENOL) 325 MG TABLET    Take 2 tablets (650 mg total) by mouth every 6 (six) hours as needed.   ALPRAZOLAM (XANAX) 0.25 MG TABLET    Take one tablet by mouth at bedtime to help rest   ASPIRIN 81 MG CHEWABLE TABLET    Chew 81 mg by mouth daily.   CETIRIZINE (ZYRTEC) 10 MG TABLET    Take 10 mg by mouth daily.   CRANBERRY 425 MG CAPS    Take by mouth daily.   DILTIAZEM (CARDIZEM CD) 120 MG 24 HR CAPSULE    Take 120 mg by mouth daily. for angina.  DOCUSATE SODIUM (COLACE) 100 MG CAPSULE    Take 100 mg by mouth daily. for  Constipation.   DONEPEZIL (ARICEPT) 5 MG TABLET    Take 10 mg by mouth at bedtime as needed. For memory   DULOXETINE (CYMBALTA) 30 MG CAPSULE    Take 30 mg by mouth daily. for depression.   HYDROCODONE-ACETAMINOPHEN (NORCO/VICODIN) 5-325 MG PER TABLET    Take one tablet by mouth every 8 hours as needed for pain   LEVOTHYROXINE (SYNTHROID, LEVOTHROID) 25 MCG TABLET    Take 1 tablet (25 mcg total) by mouth daily before breakfast.   MELATONIN 3 MG CAPS    By mouth qhs for sleep   MEMANTINE  (NAMENDA) 10 MG TABLET    Take 10 mg by mouth 2 (two) times daily.   METOPROLOL (TOPROL-XL) 100 MG 24 HR TABLET    Take 100 mg by mouth every morning. for HTN.   MIRTAZAPINE (REMERON) 15 MG TABLET    Take 15 mg by mouth at bedtime.   MULTIPLE VITAMIN (MULITIVITAMIN WITH MINERALS) TABS    Take 1 tablet by mouth every morning.   NAPROXEN SODIUM (ANAPROX) 220 MG TABLET    Take 220 mg by mouth 3 (three) times daily as needed. For pain   PHENAZOPYRIDINE (PYRIDIUM) 100 MG TABLET    Take 100 mg by mouth 3 (three) times daily as needed. for burning on urination or pain.  Modified Medications   No medications on file  Discontinued Medications   ALPRAZOLAM (XANAX) 0.5 MG TABLET    Take 0.25 mg by mouth at bedtime. Take one tablet by mouth at bedtime for anxiety     Physical Exam:  Filed Vitals:   06/08/14 1307  BP: 122/68  Pulse: 72  Temp: 98.2 F (36.8 C)  Resp: 20  Weight: 140 lb (63.504 kg)   Physical Exam  Constitutional: She is oriented to person, place, and time and well-developed, well-nourished, and in no distress.  HENT:  Head: Normocephalic and atraumatic.  Right Ear: External ear and ear canal normal.  Left Ear: External ear and ear canal normal.  Nose: Nose normal.  Mouth/Throat: Oropharynx is clear and moist. No oropharyngeal exudate.  Eyes: Conjunctivae are normal. Pupils are equal, round, and reactive to light.  Neck: Normal range of motion. Neck supple.  Cardiovascular: Normal rate, regular rhythm and normal heart sounds.   Pulmonary/Chest: Effort normal and breath sounds normal.  Abdominal: Soft. Bowel sounds are normal.  Musculoskeletal: Normal range of motion. She exhibits no edema.  Neurological: She is alert and oriented to person, place, and time.  Skin: Skin is warm and dry.  Psychiatric: Affect normal.     Labs reviewed:  Basic Metabolic Panel  Result: 11/08/2012 4:06 PM ( Status: F )  Sodium 140 135-145 mEq/L SLN  Potassium 3.7 3.5-5.3 mEq/L SLN  Chloride  105 96-112 mEq/L SLN  CO2 28 19-32 mEq/L SLN  Glucose 86 70-99 mg/dL SLN  BUN 16 1-476-23 mg/dL SLN  Creatinine 8.290.66 5.62-1.300.50-1.10 mg/dL SLN  Calcium 9.7 8.6-57.88.4-10.5 mg/dL SLN  TSH, Ultrasensitive  Result: 11/08/2012 5:22 PM ( Status: F )  TSH 2.025 0.350-4.500 uIU/mL SLN  Basic Metabolic Panel  Result: 01/08/2013 4:44 PM ( Status: F )  Sodium 138 135-145 mEq/L SLN  Potassium 4.7 3.5-5.3 mEq/L SLN  Chloride 104 96-112 mEq/L SLN  CO2 28 19-32 mEq/L SLN  Glucose 103 H 70-99 mg/dL SLN  BUN 24 H 4-696-23 mg/dL SLN  Creatinine 6.290.68 5.28-4.130.50-1.10 mg/dL SLN  Calcium  9.3 8.4-10.5 mg/dL SLN  TSH, Ultrasensitive  Result: 01/08/2013 5:10 PM ( Status: F )  TSH 2.069  CBC NO Diff (Complete Blood Count)  Result: 04/05/2013 12:15 PM ( Status: F ) C  WBC 12.6 H 4.0-10.5 K/uL SLN  RBC 5.03 3.87-5.11 MIL/uL SLN  Hemoglobin 14.6 12.0-15.0 g/dL SLN  Hematocrit 81.1 91.4-78.2 % SLN  MCV 85.3 78.0-100.0 fL SLN  MCH 29.0 26.0-34.0 pg SLN  MCHC 34.0 30.0-36.0 g/dL SLN  RDW 95.6 21.3-08.6 % SLN  Platelet Count 296 150-400 K/uL SLN  Lipid Profile  Result: 04/05/2013 12:06 PM ( Status: F )  Cholesterol 163 0-200 mg/dL SLN C  Triglyceride 578 H <150 mg/dL SLN  HDL Cholesterol 53 >39 mg/dL SLN  Total Chol/HDL Ratio 3.1 Ratio SLN  VLDL Cholesterol (Calc) 35 4-69 mg/dL SLN  LDL Cholesterol (Calc) 75  SH, Ultrasensitive  Result: 04/08/2013 7:24 PM ( Status: F ) C  TSH 1.481 0.350-4.500 UIU/mL   Comprehensive Metabolic Panel  Result: 06/07/2013 5:01 PM ( Status: F ) C  Sodium 138 135-145 mEq/L SLN  Potassium 3.5 3.5-5.3 mEq/L SLN  Chloride 103 96-112 mEq/L SLN  CO2 25 19-32 mEq/L SLN  Glucose 90 70-99 mg/dL SLN  BUN 12 6-29 mg/dL SLN  Creatinine 5.28 4.13-2.44 mg/dL SLN  Bilirubin, Total 0.4 0.2-1.2 mg/dL SLN  Alkaline Phosphatase 83 39-117 U/L SLN  AST/SGOT 19 0-37 U/L SLN  ALT/SGPT 14 0-35 U/L SLN  Total Protein 5.4 L 6.0-8.3 g/dL SLN  Albumin 3.1 L 0.1-0.2 g/dL SLN  Calcium 8.6 7.2-53.6 mg/dL SLN   Basic Metabolic  Panel    Result: 08/07/2013 4:12 PM   ( Status: F )       Sodium 144     135-145 mEq/L SLN   Potassium 5.0     3.5-5.3 mEq/L SLN   Chloride 101     96-112 mEq/L SLN   CO2 30     19-32 mEq/L SLN   Glucose 78     70-99 mg/dL SLN   BUN 10     6-44 mg/dL SLN   Creatinine 0.34     0.50-1.10 mg/dL SLN   Calcium 9.0     7.4-25.9 mg/dL SLN   Basic Metabolic Panel    Result: 11/07/2013 4:24 PM   ( Status: F )       Sodium 142     135-145 mEq/L SLN   Potassium 3.4   L 3.5-5.3 mEq/L SLN   Chloride 106     96-112 mEq/L SLN   CO2 29     19-32 mEq/L SLN   Glucose 85     70-99 mg/dL SLN   BUN 10     5-63 mg/dL SLN   Creatinine 8.75     0.50-1.10 mg/dL SLN   Calcium 9.3     6.4-33.2 mg/dL SLN CBC NO Diff (Complete Blood Count)    Result: 01/17/2014 3:36 PM   ( Status: F )       WBC 9.7     4.0-10.5 K/uL SLN   RBC 4.48     3.87-5.11 MIL/uL SLN   Hemoglobin 13.1     12.0-15.0 g/dL SLN   Hematocrit 95.1     36.0-46.0 % SLN   MCV 87.3     78.0-100.0 fL SLN   MCH 29.2     26.0-34.0 pg SLN   MCHC 33.5     30.0-36.0 g/dL SLN   RDW 88.4     16.6-06.3 % SLN  Platelet Count 274     150-400 K/uL SLN   MPV 9.4     9.4-12.4 fL SLN   Comprehensive Metabolic Panel    Result: 01/17/2014 4:11 PM   ( Status: F )       Sodium 142     135-145 mEq/L SLN   Potassium 4.3     3.5-5.3 mEq/L SLN   Chloride 101     96-112 mEq/L SLN   CO2 30     19-32 mEq/L SLN   Glucose 94     70-99 mg/dL SLN   BUN 9     1-61 mg/dL SLN   Creatinine 0.96     0.50-1.10 mg/dL SLN   Bilirubin, Total 0.3     0.2-1.2 mg/dL SLN   Alkaline Phosphatase 119   H 39-117 U/L SLN   AST/SGOT 21     0-37 U/L SLN   ALT/SGPT 15     0-35 U/L SLN   Total Protein 5.9   L 6.0-8.3 g/dL SLN   Albumin 3.3   L 0.4-5.4 g/dL SLN   Calcium 9.2     0.9-81.1 mg/dL SLN  TSH    Result: 11/14/7827 12:34 PM   ( Status: F )     C TSH 2.365     0.350-4.500 uIU/mL SLN     Assessment/Plan  1. Allergic rhinitis due to pollen -pt with increase congestion due to  allergy season, zyrtec 10 mg daily in EPIC but not on MAR, will order at this time -cont to use plain saline nasal spray PRN -flonase 50 mcg into bilateral nares daily  2. Essential hypertension Stable on current regimen.   3. Insomnia -conts with remeron and melatonion for sleep. Will use Xanas as well which has had good effects.   4. Generalized anxiety disorder -has been stable on cymbalta, to cont at this time, also with PRN xanax   5. DDD (degenerative disc disease), lumbar Following with ortho, pain comes and goes, uses norco occasionally with good effects

## 2014-07-10 LAB — TSH: TSH: 1.44 u[IU]/mL (ref <=5.90)

## 2014-07-13 ENCOUNTER — Non-Acute Institutional Stay (SKILLED_NURSING_FACILITY): Payer: Medicare Other | Admitting: Nurse Practitioner

## 2014-07-13 DIAGNOSIS — J301 Allergic rhinitis due to pollen: Secondary | ICD-10-CM

## 2014-07-13 DIAGNOSIS — E038 Other specified hypothyroidism: Secondary | ICD-10-CM

## 2014-07-13 DIAGNOSIS — G4700 Insomnia, unspecified: Secondary | ICD-10-CM | POA: Diagnosis not present

## 2014-07-13 DIAGNOSIS — R634 Abnormal weight loss: Secondary | ICD-10-CM

## 2014-07-13 DIAGNOSIS — I10 Essential (primary) hypertension: Secondary | ICD-10-CM

## 2014-07-13 DIAGNOSIS — E034 Atrophy of thyroid (acquired): Secondary | ICD-10-CM | POA: Diagnosis not present

## 2014-07-13 DIAGNOSIS — I4891 Unspecified atrial fibrillation: Secondary | ICD-10-CM

## 2014-07-13 DIAGNOSIS — F0150 Vascular dementia without behavioral disturbance: Secondary | ICD-10-CM

## 2014-07-13 DIAGNOSIS — F411 Generalized anxiety disorder: Secondary | ICD-10-CM | POA: Diagnosis not present

## 2014-07-13 DIAGNOSIS — M5136 Other intervertebral disc degeneration, lumbar region: Secondary | ICD-10-CM

## 2014-07-13 DIAGNOSIS — M51369 Other intervertebral disc degeneration, lumbar region without mention of lumbar back pain or lower extremity pain: Secondary | ICD-10-CM

## 2014-07-13 NOTE — Progress Notes (Signed)
Patient ID: Ebony [name], female   DOB: 10/17/1931, 81 y.o.   MRN: [id number]    Nursing Home Location:  [facility]   Place of Service: SNF (31)  PCP: Lubertha SouthSteve Luking, MD  Allergies  Allergen Reactions  . Penicillins Diarrhea  . Ace Inhibitors   . Biaxin [Clarithromycin] Diarrhea  . Sulfa Antibiotics Diarrhea  . Cozaar [Losartan Potassium] Rash    Chief Complaint  Patient presents with  . Medical Management of Chronic Issues    HPI:  81 y.o. female being seen today for routine follow up on chronic conditions. Pt with pmh of HTN, hypothyroid, TIA, anxiety, OA, hyperlipidemia,weight loss. Pt question medications and would like a full medication review and would like to stop some medications.     Review of Systems:  Review of Systems  Constitutional: Negative for fever, chills and weight loss.  HENT: Positive for congestion. Negative for sore throat and tinnitus.   Respiratory: Negative for cough and shortness of breath.   Cardiovascular: Negative for chest pain, palpitations and leg swelling.  Gastrointestinal: Negative for heartburn, abdominal pain, diarrhea and constipation.  Genitourinary: Negative for dysuria, urgency and frequency.  Musculoskeletal: Positive for back pain (lumbar spine ). Negative for myalgias, joint pain and falls.  Skin: Negative.   Neurological: Negative for dizziness, weakness and headaches.  Psychiatric/Behavioral: Negative for depression and memory loss. The patient has insomnia (medication helps).      Past Medical History  Diagnosis Date  . Hypertension   . Subarachnoid hemorrhage following injury 05/16/2011  . Unspecified hypothyroidism 04/15/2012  . SDH (subdural hematoma) 05/16/2011  . Hiatal hernia   . Allergic rhinitis   . Fibrocystic breast disease   . Anxiety   . IFG (impaired fasting glucose)   . Arthritis     Knees  . Hyperlipidemia   . Depression   . Subarachnoid hemorrhage following injury 05/16/2011  .  Acute ischemic stroke 04/11/2012   Past Surgical History  Procedure Laterality Date  . Abdominal hysterectomy    . Breast surgery    . Tonsillectomy    . Eye surgery     Social History:   reports that she has never smoked. She does not have any smokeless tobacco history on file. She reports that she does not drink alcohol or use illicit drugs.  Family History  Problem Relation Age of Onset  . CVA Neg Hx   . Diabetes Neg Hx   . Hypertension Neg Hx   . Lung cancer Sister     Medications: Patient's Medications  New Prescriptions   No medications on file  Previous Medications   ACETAMINOPHEN (TYLENOL) 325 MG TABLET    Take 2 tablets (650 mg total) by mouth every 6 (six) hours as needed.   ALPRAZOLAM (XANAX) 0.25 MG TABLET    Take one tablet by mouth at bedtime to help rest   ASPIRIN 81 MG CHEWABLE TABLET    Chew 81 mg by mouth daily.   CETIRIZINE (ZYRTEC) 10 MG TABLET    Take 10 mg by mouth daily.   CRANBERRY 425 MG CAPS    Take by mouth daily.   DILTIAZEM (CARDIZEM CD) 120 MG 24 HR CAPSULE    Take 120 mg by mouth daily. for angina.   DONEPEZIL (ARICEPT) 5 MG TABLET    Take 10 mg by mouth at bedtime as needed. For memory   DULOXETINE (CYMBALTA) 30 MG CAPSULE    Take 30 mg by mouth daily. for depression.  FLUTICASONE (FLONASE) 50 MCG/ACT NASAL SPRAY    Place 2 sprays into both nostrils daily.   HYDROCODONE-ACETAMINOPHEN (NORCO/VICODIN) 5-325 MG PER TABLET    Take one tablet by mouth every 8 hours as needed for pain   LEVOTHYROXINE (SYNTHROID, LEVOTHROID) 25 MCG TABLET    Take 1 tablet (25 mcg total) by mouth daily before breakfast.   MELATONIN 3 MG CAPS    By mouth qhs for sleep   MEMANTINE (NAMENDA) 10 MG TABLET    Take 10 mg by mouth 2 (two) times daily.   METOPROLOL (TOPROL-XL) 100 MG 24 HR TABLET    Take 100 mg by mouth every morning. for HTN.   MIRTAZAPINE (REMERON) 15 MG TABLET    Take 15 mg by mouth at bedtime.   MULTIPLE VITAMIN (MULITIVITAMIN WITH MINERALS) TABS    Take 1  tablet by mouth every morning.   NAPROXEN SODIUM (ANAPROX) 220 MG TABLET    Take 220 mg by mouth 3 (three) times daily as needed. For pain   PHENAZOPYRIDINE (PYRIDIUM) 100 MG TABLET    Take 100 mg by mouth 3 (three) times daily as needed. for burning on urination or pain.  Modified Medications   No medications on file  Discontinued Medications   DOCUSATE SODIUM (COLACE) 100 MG CAPSULE    Take 100 mg by mouth daily. for  Constipation.     Physical Exam:  Filed Vitals:   07/13/14 1502  BP: 136/77  Pulse: 64  Temp: 97 F (36.1 C)  Resp: 20  Weight: 143 lb (64.864 kg)   Physical Exam  Constitutional: She is oriented to person, place, and time and well-developed, well-nourished, and in no distress.  HENT:  Head: Normocephalic and atraumatic.  Right Ear: External ear and ear canal normal.  Left Ear: External ear and ear canal normal.  Nose: Nose normal.  Mouth/Throat: Oropharynx is clear and moist. No oropharyngeal exudate.  Eyes: Conjunctivae are normal. Pupils are equal, round, and reactive to light.  Neck: Normal range of motion. Neck supple.  Cardiovascular: Normal rate, regular rhythm and normal heart sounds.   Pulmonary/Chest: Effort normal and breath sounds normal.  Abdominal: Soft. Bowel sounds are normal.  Musculoskeletal: Normal range of motion. She exhibits no edema.  Neurological: She is alert and oriented to person, place, and time.  Skin: Skin is warm and dry.  Psychiatric: Affect normal.     Labs reviewed:  Basic Metabolic Panel  Result: 11/08/2012 4:06 PM ( Status: F )  Sodium 140 135-145 mEq/L SLN  Potassium 3.7 3.5-5.3 mEq/L SLN  Chloride 105 96-112 mEq/L SLN  CO2 28 19-32 mEq/L SLN  Glucose 86 70-99 mg/dL SLN  BUN 16 4-096-23 mg/dL SLN  Creatinine 8.110.66 9.14-7.820.50-1.10 mg/dL SLN  Calcium 9.7 9.5-62.18.4-10.5 mg/dL SLN  TSH, Ultrasensitive  Result: 11/08/2012 5:22 PM ( Status: F )  TSH 2.025 0.350-4.500 uIU/mL SLN  Basic Metabolic Panel  Result: 01/08/2013 4:44 PM (  Status: F )  Sodium 138 135-145 mEq/L SLN  Potassium 4.7 3.5-5.3 mEq/L SLN  Chloride 104 96-112 mEq/L SLN  CO2 28 19-32 mEq/L SLN  Glucose 103 H 70-99 mg/dL SLN  BUN 24 H 3-086-23 mg/dL SLN  Creatinine 6.570.68 8.46-9.620.50-1.10 mg/dL SLN  Calcium 9.3 9.5-28.48.4-10.5 mg/dL SLN  TSH, Ultrasensitive  Result: 01/08/2013 5:10 PM ( Status: F )  TSH 2.069  CBC NO Diff (Complete Blood Count)  Result: 04/05/2013 12:15 PM ( Status: F ) C  WBC 12.6 H 4.0-10.5 K/uL SLN  RBC 5.03 3.87-5.11  MIL/uL SLN  Hemoglobin 14.6 12.0-15.0 g/dL SLN  Hematocrit 16.1 09.6-04.5 % SLN  MCV 85.3 78.0-100.0 fL SLN  MCH 29.0 26.0-34.0 pg SLN  MCHC 34.0 30.0-36.0 g/dL SLN  RDW 40.9 81.1-91.4 % SLN  Platelet Count 296 150-400 K/uL SLN  Lipid Profile  Result: 04/05/2013 12:06 PM ( Status: F )  Cholesterol 163 0-200 mg/dL SLN C  Triglyceride 782 H <150 mg/dL SLN  HDL Cholesterol 53 >39 mg/dL SLN  Total Chol/HDL Ratio 3.1 Ratio SLN  VLDL Cholesterol (Calc) 35 9-56 mg/dL SLN  LDL Cholesterol (Calc) 75  SH, Ultrasensitive  Result: 04/08/2013 7:24 PM ( Status: F ) C  TSH 1.481 0.350-4.500 UIU/mL   Comprehensive Metabolic Panel  Result: 06/07/2013 5:01 PM ( Status: F ) C  Sodium 138 135-145 mEq/L SLN  Potassium 3.5 3.5-5.3 mEq/L SLN  Chloride 103 96-112 mEq/L SLN  CO2 25 19-32 mEq/L SLN  Glucose 90 70-99 mg/dL SLN  BUN 12 2-13 mg/dL SLN  Creatinine 0.86 5.78-4.69 mg/dL SLN  Bilirubin, Total 0.4 0.2-1.2 mg/dL SLN  Alkaline Phosphatase 83 39-117 U/L SLN  AST/SGOT 19 0-37 U/L SLN  ALT/SGPT 14 0-35 U/L SLN  Total Protein 5.4 L 6.0-8.3 g/dL SLN  Albumin 3.1 L 6.2-9.5 g/dL SLN  Calcium 8.6 2.8-41.3 mg/dL SLN   Basic Metabolic Panel    Result: 08/07/2013 4:12 PM   ( Status: F )       Sodium 144     135-145 mEq/L SLN   Potassium 5.0     3.5-5.3 mEq/L SLN   Chloride 101     96-112 mEq/L SLN   CO2 30     19-32 mEq/L SLN   Glucose 78     70-99 mg/dL SLN   BUN 10     2-44 mg/dL SLN   Creatinine 0.10     0.50-1.10 mg/dL SLN   Calcium 9.0       8.4-10.5 mg/dL SLN   Basic Metabolic Panel    Result: 11/07/2013 4:24 PM   ( Status: F )       Sodium 142     135-145 mEq/L SLN   Potassium 3.4   L 3.5-5.3 mEq/L SLN   Chloride 106     96-112 mEq/L SLN   CO2 29     19-32 mEq/L SLN   Glucose 85     70-99 mg/dL SLN   BUN 10     2-72 mg/dL SLN   Creatinine 5.36     0.50-1.10 mg/dL SLN   Calcium 9.3     6.4-40.3 mg/dL SLN CBC NO Diff (Complete Blood Count)    Result: 01/17/2014 3:36 PM   ( Status: F )       WBC 9.7     4.0-10.5 K/uL SLN   RBC 4.48     3.87-5.11 MIL/uL SLN   Hemoglobin 13.1     12.0-15.0 g/dL SLN   Hematocrit 47.4     36.0-46.0 % SLN   MCV 87.3     78.0-100.0 fL SLN   MCH 29.2     26.0-34.0 pg SLN   MCHC 33.5     30.0-36.0 g/dL SLN   RDW 25.9     56.3-87.5 % SLN   Platelet Count 274     150-400 K/uL SLN   MPV 9.4     9.4-12.4 fL SLN   Comprehensive Metabolic Panel    Result: 01/17/2014 4:11 PM   ( Status: F )       Sodium  142     135-145 mEq/L SLN   Potassium 4.3     3.5-5.3 mEq/L SLN   Chloride 101     96-112 mEq/L SLN   CO2 30     19-32 mEq/L SLN   Glucose 94     70-99 mg/dL SLN   BUN 9     6-38 mg/dL SLN   Creatinine 7.56     0.50-1.10 mg/dL SLN   Bilirubin, Total 0.3     0.2-1.2 mg/dL SLN   Alkaline Phosphatase 119   H 39-117 U/L SLN   AST/SGOT 21     0-37 U/L SLN   ALT/SGPT 15     0-35 U/L SLN   Total Protein 5.9   L 6.0-8.3 g/dL SLN   Albumin 3.3   L 4.3-3.2 g/dL SLN   Calcium 9.2     9.5-18.8 mg/dL SLN  TSH    Result: 06/15/6061 12:34 PM   ( Status: F )     C TSH 2.365     0.350-4.500 uIU/mL SLN     Assessment/Plan  1. Atrial fibrillation with rapid ventricular response Hx of a fib with RVR, currently in SR, rate controlled on current regimen. Hx of subdural hematoma and therefore only maintained on ASA  2. DDD (degenerative disc disease), lumbar -lumbar spine pain occasional, currently needing PRN rarely  -does not wish to use biofreeze- will dc  3. Essential hypertension -stable on current  regimen, will follow up BMP  4. Hypothyroidism due to acquired atrophy of thyroid -discussed need for thyroid medication, discussed recent TSH  5. Vascular dementia without behavioral disturbance -pt with mild memory impairment, has been maintained on aricept and namenda without adverse effects. Discuss need to stay on medication to preserve current memory.   6. Generalized anxiety disorder -currently on Cymbalta which appears to be controlling anxiety well, pt agrees however during visit she does refer to things that bother her frequently   7. Loss of weight Weight has been maintained on remeron, with last dose adjustment weight loss was noted  8. Insomnia Well controlled on Remeron and melatonin   9. Allergic rhinitis due to pollen -remains with congestion from allergies, re-educated on medication and why she was started on flonase and zyrtec  -she would like to cont for now  10. Hx of UTI with dysuria -was placed on cranberry after frequent complaints of dysuria which did improved, pt would like to stop cranberry she feels as if it is upsetting her stomach. Will DC

## 2014-07-18 LAB — BASIC METABOLIC PANEL
BUN: 13 mg/dL (ref 4–21)
Creatinine: 0.7 mg/dL (ref 0.5–1.1)
Glucose: 89 mg/dL
Potassium: 4.4 mmol/L (ref 3.4–5.3)
Sodium: 141 mmol/L (ref 137–147)

## 2014-07-18 LAB — HEPATIC FUNCTION PANEL
ALT: 11 U/L (ref 7–35)
AST: 17 U/L (ref 13–35)
Alkaline Phosphatase: 104 U/L (ref 25–125)
Bilirubin, Total: 0.5 mg/dL

## 2014-07-18 LAB — CBC AND DIFFERENTIAL
HCT: 37 % (ref 36–46)
Hemoglobin: 12.8 g/dL (ref 12.0–16.0)
PLATELETS: 207 10*3/uL (ref 150–399)
WBC: 7.7 10^3/mL

## 2014-07-18 LAB — LIPID PANEL
Cholesterol: 168 mg/dL (ref 0–200)
HDL: 54 mg/dL (ref 35–70)
LDL Cholesterol: 90 mg/dL
Triglycerides: 122 mg/dL (ref 40–160)

## 2014-08-12 ENCOUNTER — Telehealth: Payer: Self-pay | Admitting: Family Medicine

## 2014-08-12 NOTE — Telephone Encounter (Signed)
Called about patient. Discovered patient is not a [facility] patient.

## 2014-08-17 ENCOUNTER — Non-Acute Institutional Stay (SKILLED_NURSING_FACILITY): Payer: Medicare Other | Admitting: Nurse Practitioner

## 2014-08-17 DIAGNOSIS — I10 Essential (primary) hypertension: Secondary | ICD-10-CM

## 2014-08-17 DIAGNOSIS — F411 Generalized anxiety disorder: Secondary | ICD-10-CM | POA: Diagnosis not present

## 2014-08-17 DIAGNOSIS — I4891 Unspecified atrial fibrillation: Secondary | ICD-10-CM

## 2014-08-17 DIAGNOSIS — R634 Abnormal weight loss: Secondary | ICD-10-CM | POA: Diagnosis not present

## 2014-08-17 DIAGNOSIS — M159 Polyosteoarthritis, unspecified: Secondary | ICD-10-CM

## 2014-08-17 DIAGNOSIS — K529 Noninfective gastroenteritis and colitis, unspecified: Secondary | ICD-10-CM

## 2014-08-17 NOTE — Progress Notes (Signed)
Patient ID: Ebony [name], female   DOB: 1931/03/20, 81 y.o.   MRN: [id number]    Nursing Home Location:  [facility]   Place of Service: SNF (31)  PCP: Lubertha South, MD  Allergies  Allergen Reactions  . Penicillins Diarrhea  . Ace Inhibitors   . Biaxin [Clarithromycin] Diarrhea  . Sulfa Antibiotics Diarrhea  . Cozaar [Losartan Potassium] Rash    Chief Complaint  Patient presents with  . Medical Management of Chronic Issues    HPI:  81 y.o. female being seen today for routine follow up on chronic conditions. Pt with pmh of HTN, hypothyroid, TIA, anxiety, OA, hyperlipidemia,weight loss. Pt reports she has had GI bug this week but otherwise this month has been good. Reports she had nausea with 1 episodes of vomiting and several boughs of diarrhea. Finally feeling better after 5 days. Had regular BM today. No fevers or chills but remains weak. Tolerating diet and able to keep fluids down without problems at this time. Pt reports elevated blood pressure when she was not able to take her medications. Nursing without concerns at this time.    Review of Systems:  Review of Systems  Constitutional: Negative for fever, chills and weight loss.  HENT: Negative for tinnitus.   Respiratory: Negative for cough, sputum production and shortness of breath.   Cardiovascular: Negative for chest pain, palpitations and leg swelling.  Gastrointestinal: Negative for heartburn, abdominal pain, diarrhea and constipation.  Genitourinary: Negative for dysuria, urgency and frequency.  Musculoskeletal: Positive for back pain (stable). Negative for myalgias, joint pain and falls.  Skin: Negative.   Neurological: Negative for dizziness and headaches.  Psychiatric/Behavioral: Negative for depression. The patient does not have insomnia.      Past Medical History  Diagnosis Date  . Hypertension   . Subarachnoid hemorrhage following injury 05/16/2011  . Unspecified hypothyroidism  04/15/2012  . SDH (subdural hematoma) 05/16/2011  . Hiatal hernia   . Allergic rhinitis   . Fibrocystic breast disease   . Anxiety   . IFG (impaired fasting glucose)   . Arthritis     Knees  . Hyperlipidemia   . Depression   . Subarachnoid hemorrhage following injury 05/16/2011  . Acute ischemic stroke 04/11/2012   Past Surgical History  Procedure Laterality Date  . Abdominal hysterectomy    . Breast surgery    . Tonsillectomy    . Eye surgery     Social History:   reports that she has never smoked. She does not have any smokeless tobacco history on file. She reports that she does not drink alcohol or use illicit drugs.  Family History  Problem Relation Age of Onset  . CVA Neg Hx   . Diabetes Neg Hx   . Hypertension Neg Hx   . Lung cancer Sister     Medications: Patient's Medications  New Prescriptions   No medications on file  Previous Medications   ACETAMINOPHEN (TYLENOL) 325 MG TABLET    Take 2 tablets (650 mg total) by mouth every 6 (six) hours as needed.   ALPRAZOLAM (XANAX) 0.25 MG TABLET    Take one tablet by mouth at bedtime to help rest   ASPIRIN 81 MG CHEWABLE TABLET    Chew 81 mg by mouth daily.   CETIRIZINE (ZYRTEC) 10 MG TABLET    Take 10 mg by mouth daily.   DILTIAZEM (CARDIZEM CD) 120 MG 24 HR CAPSULE    Take 120 mg by mouth daily. for angina.  DONEPEZIL (ARICEPT) 5 MG TABLET    Take 10 mg by mouth at bedtime as needed. For memory   DULOXETINE (CYMBALTA) 30 MG CAPSULE    Take 30 mg by mouth daily. for depression.   FLUTICASONE (FLONASE) 50 MCG/ACT NASAL SPRAY    Place 2 sprays into both nostrils daily.   HYDROCODONE-ACETAMINOPHEN (NORCO/VICODIN) 5-325 MG PER TABLET    Take one tablet by mouth every 8 hours as needed for pain   LEVOTHYROXINE (SYNTHROID, LEVOTHROID) 25 MCG TABLET    Take 1 tablet (25 mcg total) by mouth daily before breakfast.   MELATONIN 3 MG CAPS    By mouth qhs for sleep   MEMANTINE (NAMENDA) 10 MG TABLET    Take 10 mg by mouth 2 (two)  times daily.   METOPROLOL (TOPROL-XL) 100 MG 24 HR TABLET    Take 100 mg by mouth every morning. for HTN.   MIRTAZAPINE (REMERON) 15 MG TABLET    Take 15 mg by mouth at bedtime.   MULTIPLE VITAMIN (MULITIVITAMIN WITH MINERALS) TABS    Take 1 tablet by mouth every morning.   NAPROXEN SODIUM (ANAPROX) 220 MG TABLET    Take 220 mg by mouth 3 (three) times daily as needed. For pain   PHENAZOPYRIDINE (PYRIDIUM) 100 MG TABLET    Take 100 mg by mouth 3 (three) times daily as needed. for burning on urination or pain.  Modified Medications   No medications on file  Discontinued Medications   No medications on file     Physical Exam:  Filed Vitals:   08/17/14 1253  BP: 142/70  Pulse: 70  Temp: 97.3 F (36.3 C)  Resp: 20  Weight: 143 lb (64.864 kg)   Physical Exam  Physical Exam  Constitutional: She is oriented to person, place, and time and well-developed, well-nourished, and in no distress.  HENT:  Head: Normocephalic and atraumatic.  Right Ear: External ear and ear canal normal.  Left Ear: External ear and ear canal normal.  Nose: Nose normal.  Mouth/Throat: Oropharynx is clear and moist. No oropharyngeal exudate.  Eyes: Conjunctivae are normal. Pupils are equal, round, and reactive to light.  Neck: Normal range of motion. Neck supple.  Cardiovascular: Normal rate, regular rhythm and normal heart sounds.   Pulmonary/Chest: Effort normal and breath sounds normal.  Abdominal: Soft. Bowel sounds are normal. She exhibits no distension. There is no tenderness. There is no rebound and no guarding.  Musculoskeletal: Normal range of motion. She exhibits no edema or tenderness.  Neurological: She is alert and oriented to person, place, and time.  Skin: Skin is warm and dry.  Psychiatric: Affect normal.      Labs reviewed:  Basic Metabolic Panel  Result: 11/08/2012 4:06 PM ( Status: F )  Sodium 140 135-145 mEq/L SLN  Potassium 3.7 3.5-5.3 mEq/L SLN  Chloride 105 96-112 mEq/L SLN  CO2  28 19-32 mEq/L SLN  Glucose 86 70-99 mg/dL SLN  BUN 16 5-59 mg/dL SLN  Creatinine 7.41 6.38-4.53 mg/dL SLN  Calcium 9.7 6.4-68.0 mg/dL SLN  TSH, Ultrasensitive  Result: 11/08/2012 5:22 PM ( Status: F )  TSH 2.025 0.350-4.500 uIU/mL SLN  Basic Metabolic Panel  Result: 01/08/2013 4:44 PM ( Status: F )  Sodium 138 135-145 mEq/L SLN  Potassium 4.7 3.5-5.3 mEq/L SLN  Chloride 104 96-112 mEq/L SLN  CO2 28 19-32 mEq/L SLN  Glucose 103 H 70-99 mg/dL SLN  BUN 24 H 3-21 mg/dL SLN  Creatinine 2.24 8.25-0.03 mg/dL SLN  Calcium 9.3  8.4-10.5 mg/dL SLN  TSH, Ultrasensitive  Result: 01/08/2013 5:10 PM ( Status: F )  TSH 2.069  CBC NO Diff (Complete Blood Count)  Result: 04/05/2013 12:15 PM ( Status: F ) C  WBC 12.6 H 4.0-10.5 K/uL SLN  RBC 5.03 3.87-5.11 MIL/uL SLN  Hemoglobin 14.6 12.0-15.0 g/dL SLN  Hematocrit 16.1 09.6-04.5 % SLN  MCV 85.3 78.0-100.0 fL SLN  MCH 29.0 26.0-34.0 pg SLN  MCHC 34.0 30.0-36.0 g/dL SLN  RDW 40.9 81.1-91.4 % SLN  Platelet Count 296 150-400 K/uL SLN  Lipid Profile  Result: 04/05/2013 12:06 PM ( Status: F )  Cholesterol 163 0-200 mg/dL SLN C  Triglyceride 782 H <150 mg/dL SLN  HDL Cholesterol 53 >39 mg/dL SLN  Total Chol/HDL Ratio 3.1 Ratio SLN  VLDL Cholesterol (Calc) 35 9-56 mg/dL SLN  LDL Cholesterol (Calc) 75  SH, Ultrasensitive  Result: 04/08/2013 7:24 PM ( Status: F ) C  TSH 1.481 0.350-4.500 UIU/mL   Comprehensive Metabolic Panel  Result: 06/07/2013 5:01 PM ( Status: F ) C  Sodium 138 135-145 mEq/L SLN  Potassium 3.5 3.5-5.3 mEq/L SLN  Chloride 103 96-112 mEq/L SLN  CO2 25 19-32 mEq/L SLN  Glucose 90 70-99 mg/dL SLN  BUN 12 2-13 mg/dL SLN  Creatinine 0.86 5.78-4.69 mg/dL SLN  Bilirubin, Total 0.4 0.2-1.2 mg/dL SLN  Alkaline Phosphatase 83 39-117 U/L SLN  AST/SGOT 19 0-37 U/L SLN  ALT/SGPT 14 0-35 U/L SLN  Total Protein 5.4 L 6.0-8.3 g/dL SLN  Albumin 3.1 L 6.2-9.5 g/dL SLN  Calcium 8.6 2.8-41.3 mg/dL SLN   Basic Metabolic Panel    Result: 08/07/2013  4:12 PM   ( Status: F )       Sodium 144     135-145 mEq/L SLN   Potassium 5.0     3.5-5.3 mEq/L SLN   Chloride 101     96-112 mEq/L SLN   CO2 30     19-32 mEq/L SLN   Glucose 78     70-99 mg/dL SLN   BUN 10     2-44 mg/dL SLN   Creatinine 0.10     0.50-1.10 mg/dL SLN   Calcium 9.0     2.7-25.3 mg/dL SLN   Basic Metabolic Panel    Result: 11/07/2013 4:24 PM   ( Status: F )       Sodium 142     135-145 mEq/L SLN   Potassium 3.4   L 3.5-5.3 mEq/L SLN   Chloride 106     96-112 mEq/L SLN   CO2 29     19-32 mEq/L SLN   Glucose 85     70-99 mg/dL SLN   BUN 10     6-64 mg/dL SLN   Creatinine 4.03     0.50-1.10 mg/dL SLN   Calcium 9.3     4.7-42.5 mg/dL SLN CBC NO Diff (Complete Blood Count)    Result: 01/17/2014 3:36 PM   ( Status: F )       WBC 9.7     4.0-10.5 K/uL SLN   RBC 4.48     3.87-5.11 MIL/uL SLN   Hemoglobin 13.1     12.0-15.0 g/dL SLN   Hematocrit 95.6     36.0-46.0 % SLN   MCV 87.3     78.0-100.0 fL SLN   MCH 29.2     26.0-34.0 pg SLN   MCHC 33.5     30.0-36.0 g/dL SLN   RDW 38.7     56.4-33.2 % SLN   Platelet  Count 274     150-400 K/uL SLN   MPV 9.4     9.4-12.4 fL SLN   Comprehensive Metabolic Panel    Result: 01/17/2014 4:11 PM   ( Status: F )       Sodium 142     135-145 mEq/L SLN   Potassium 4.3     3.5-5.3 mEq/L SLN   Chloride 101     96-112 mEq/L SLN   CO2 30     19-32 mEq/L SLN   Glucose 94     70-99 mg/dL SLN   BUN 9     1-61 mg/dL SLN   Creatinine 0.96     0.50-1.10 mg/dL SLN   Bilirubin, Total 0.3     0.2-1.2 mg/dL SLN   Alkaline Phosphatase 119   H 39-117 U/L SLN   AST/SGOT 21     0-37 U/L SLN   ALT/SGPT 15     0-35 U/L SLN   Total Protein 5.9   L 6.0-8.3 g/dL SLN   Albumin 3.3   L 0.4-5.4 g/dL SLN   Calcium 9.2     0.9-81.1 mg/dL SLN  TSH    Result: 11/14/7827 12:34 PM   ( Status: F )     C TSH 2.365     0.350-4.500 uIU/mL SLN     Assessment/Plan 1. Gastroenteritis -nausea, vomiting and diarrhea for 5 days, symptoms started improving yesterday, pt  still with some weakness but able to tolerate diet. To increase diet and activity as tolerates   2. Loss of weight Weight has remained stable.   3. Essential hypertension -blood pressure elevated this past week due to pt not being able to tolerate medication secondary to Gastroenteritis, will have staff check VS daily for 1 week.    4. Osteoarthritis of multiple joints, unspecified osteoarthritis type -OA remains stable and controlled, no increase in pain  5. Generalized anxiety disorder -controlled on cymbalta  6. Atrial fibrillation with rapid ventricular response Remains in regular rate and rhythm, maintained on ASA only

## 2014-09-14 ENCOUNTER — Non-Acute Institutional Stay (SKILLED_NURSING_FACILITY): Payer: Medicare Other | Admitting: Nurse Practitioner

## 2014-09-14 ENCOUNTER — Encounter: Payer: Self-pay | Admitting: Nurse Practitioner

## 2014-09-14 DIAGNOSIS — I10 Essential (primary) hypertension: Secondary | ICD-10-CM | POA: Diagnosis not present

## 2014-09-14 DIAGNOSIS — F411 Generalized anxiety disorder: Secondary | ICD-10-CM | POA: Diagnosis not present

## 2014-09-14 DIAGNOSIS — I480 Paroxysmal atrial fibrillation: Secondary | ICD-10-CM

## 2014-09-14 DIAGNOSIS — J301 Allergic rhinitis due to pollen: Secondary | ICD-10-CM

## 2014-09-14 DIAGNOSIS — M5136 Other intervertebral disc degeneration, lumbar region: Secondary | ICD-10-CM

## 2014-09-14 NOTE — Progress Notes (Signed)
Patient ID: Ebony [name], female   DOB: 14-Sep-1931, 81 y.o.   MRN: [id number]    Nursing Home Location:  [facility]   Place of Service: SNF (31)  PCP: Lubertha South, MD  Allergies  Allergen Reactions  . Penicillins Diarrhea  . Ace Inhibitors   . Biaxin [Clarithromycin] Diarrhea  . Sulfa Antibiotics Diarrhea  . Cozaar [Losartan Potassium] Rash    Chief Complaint  Patient presents with  . Medical Management of Chronic Issues    Routine Visit     HPI:  Patient is a 81 y.o. female seen today at [facility] for routine follow up on chronic conditions. Pt with pmh of HTN, hypothyroid, TIA, anxiety, OA, hyperlipidemia,weight loss. Pt has been doing well in the last month. Reports her main issue is anxiety and back pain.  Reports she gets anxious and agitated at times, worse when she thinks about money and finances. Has always done her own finances and now her daughter is managing them, makes her uneasy feeling not knowing what is going on even though her daughter does keep her informed.  Also reports chronic low back pain. Some times worse than others. Will take a pain pill occasionally but only with food as it will upset her stomach. Has good effects from medication. No adverse effects noted. Otherwise doing well. No chest pains, shortness of breath, fevers or chills. Allergies has improved.  Review of Systems:  Review of Systems  Constitutional: Negative for fever and chills.  HENT: Negative for tinnitus.   Respiratory: Negative for cough and shortness of breath.   Cardiovascular: Negative for chest pain, palpitations and leg swelling.  Gastrointestinal: Negative for abdominal pain, diarrhea and constipation.  Genitourinary: Negative for dysuria, urgency and frequency.  Musculoskeletal: Positive for back pain (stable). Negative for myalgias.  Skin: Negative.   Neurological: Negative for dizziness and headaches.  Psychiatric/Behavioral: Positive for  agitation (with anxiety).    Past Medical History  Diagnosis Date  . Hypertension   . Subarachnoid hemorrhage following injury 05/16/2011  . Unspecified hypothyroidism 04/15/2012  . SDH (subdural hematoma) 05/16/2011  . Hiatal hernia   . Allergic rhinitis   . Fibrocystic breast disease   . Anxiety   . IFG (impaired fasting glucose)   . Arthritis     Knees  . Hyperlipidemia   . Depression   . Subarachnoid hemorrhage following injury 05/16/2011  . Acute ischemic stroke 04/11/2012   Past Surgical History  Procedure Laterality Date  . Abdominal hysterectomy    . Breast surgery    . Tonsillectomy    . Eye surgery     Social History:   reports that she has never smoked. She does not have any smokeless tobacco history on file. She reports that she does not drink alcohol or use illicit drugs.  Family History  Problem Relation Age of Onset  . CVA Neg Hx   . Diabetes Neg Hx   . Hypertension Neg Hx   . Lung cancer Sister     Medications: Patient's Medications  New Prescriptions   No medications on file  Previous Medications   ACETAMINOPHEN (TYLENOL) 325 MG TABLET    Take 2 tablets (650 mg total) by mouth every 6 (six) hours as needed.   ASPIRIN 81 MG CHEWABLE TABLET    Chew 81 mg by mouth daily.   CETIRIZINE (ZYRTEC) 10 MG TABLET    Take 10 mg by mouth daily.   DILTIAZEM (CARDIZEM CD) 120 MG 24  HR CAPSULE    Take 120 mg by mouth daily. for angina.   DOCUSATE SODIUM (COLACE) 100 MG CAPSULE    Take 100 mg by mouth daily as needed for mild constipation.   DONEPEZIL (ARICEPT) 10 MG TABLET    Take 10 mg by mouth at bedtime.   DULOXETINE (CYMBALTA) 60 MG CAPSULE    Take 60 mg by mouth daily.   FLUTICASONE (FLONASE) 50 MCG/ACT NASAL SPRAY    Place 2 sprays into both nostrils daily.   HYDROCODONE-ACETAMINOPHEN (NORCO/VICODIN) 5-325 MG PER TABLET    Take one tablet by mouth every 8 hours as needed for pain   LEVOTHYROXINE (SYNTHROID, LEVOTHROID) 25 MCG TABLET    Take 1 tablet (25 mcg  total) by mouth daily before breakfast.   LOPERAMIDE HCL (LOPERAMIDE A-D PO)    Take 4 mg by mouth. Take after 1 st loose stool as needed   MELATONIN 3 MG CAPS    By mouth qhs for sleep   MEMANTINE (NAMENDA) 10 MG TABLET    Take 10 mg by mouth 2 (two) times daily.   METOPROLOL (TOPROL-XL) 100 MG 24 HR TABLET    Take 100 mg by mouth every morning. for HTN.   MIRTAZAPINE (REMERON) 15 MG TABLET    Take 15 mg by mouth at bedtime.   MULTIPLE VITAMIN (MULITIVITAMIN WITH MINERALS) TABS    Take 1 tablet by mouth every morning.   ONDANSETRON (ZOFRAN) 4 MG TABLET    Take 4 mg by mouth every 6 (six) hours as needed for nausea or vomiting.   SODIUM CHLORIDE (OCEAN) 0.65 % SOLN NASAL SPRAY    Place 2 sprays into both nostrils 4 (four) times daily as needed for congestion.  Modified Medications   Modified Medication Previous Medication   ALPRAZOLAM (XANAX) 0.25 MG TABLET ALPRAZolam (XANAX) 0.25 MG tablet      Take one tablet by mouth at bedtime to help rest, 1 by mouth every 8 hours as needed    Take one tablet by mouth at bedtime to help rest  Discontinued Medications   DONEPEZIL (ARICEPT) 5 MG TABLET    Take 10 mg by mouth at bedtime as needed. For memory   DULOXETINE (CYMBALTA) 30 MG CAPSULE    Take 30 mg by mouth daily. for depression.   NAPROXEN SODIUM (ANAPROX) 220 MG TABLET    Take 220 mg by mouth 3 (three) times daily as needed. For pain   PHENAZOPYRIDINE (PYRIDIUM) 100 MG TABLET    Take 100 mg by mouth 3 (three) times daily as needed. for burning on urination or pain.     Physical Exam: Filed Vitals:   09/14/14 1319  BP: 132/74  Pulse: 72  Temp: 97.5 F (36.4 C)  TempSrc: Oral  Resp: 20  Weight: 146 lb (66.225 kg)    Physical Exam  Constitutional: She is oriented to person, place, and time. She appears well-developed and well-nourished. No distress.  HENT:  Head: Normocephalic and atraumatic.  Mouth/Throat: Oropharynx is clear and moist. No oropharyngeal exudate.  Eyes: Conjunctivae  are normal. Pupils are equal, round, and reactive to light.  Neck: Normal range of motion. Neck supple.  Cardiovascular: Normal rate, regular rhythm and normal heart sounds.   Pulmonary/Chest: Effort normal and breath sounds normal.  Abdominal: Soft. Bowel sounds are normal.  Musculoskeletal: She exhibits no edema or tenderness.  Neurological: She is alert and oriented to person, place, and time.  Skin: Skin is warm and dry. She is not diaphoretic.  Psychiatric: She has a normal mood and affect.    Labs reviewed: Basic Metabolic Panel:  Recent Labs  16/12/9607/08/15 07/18/14  NA 142 141  K 3.4 4.4  BUN 10 13  CREATININE 0.8 0.7   Liver Function Tests:  Recent Labs  07/18/14  AST 17  ALT 11  ALKPHOS 104   No results for input(s): LIPASE, AMYLASE in the last 8760 hours. No results for input(s): AMMONIA in the last 8760 hours. CBC:  Recent Labs  07/18/14  WBC 7.7  HGB 12.8  HCT 37  PLT 207   TSH:  Recent Labs  07/10/14  TSH 1.44   A1C: No results found for: HGBA1C Lipid Panel:  Recent Labs  07/18/14  CHOL 168  HDL 54  LDLCALC 90  TRIG 122     Assessment/Plan 1. DDD (degenerative disc disease), lumbar Stable, pain worse at times however pain regimen appears effective at this time. Has followed with orthopedic in the past.  2. Generalized anxiety disorder Reports anxiety and agitation. Will cont on cymbalta and remeron at this time  3. Essential hypertension Controlled on current regimen.   4. Paroxysmal atrial fibrillation Rate controlled, conts on ASA daily, conts on cardizem and metoprolol   5. Allergic rhinitis due to pollen -improved with current regimen. conts on zyrtec and flonase  6. Hypothyroid -TSH at goal in may -conts on synthroid 75 mcg daily     Jessica K. Biagio BorgEubanks, AGNP  [facility] [phone](Monday-Friday 8 am - 5 pm) [phone] (after hours)

## 2014-10-24 ENCOUNTER — Encounter: Payer: Self-pay | Admitting: Nurse Practitioner

## 2014-10-24 ENCOUNTER — Non-Acute Institutional Stay (SKILLED_NURSING_FACILITY): Payer: Medicare Other | Admitting: Nurse Practitioner

## 2014-10-24 DIAGNOSIS — M5136 Other intervertebral disc degeneration, lumbar region: Secondary | ICD-10-CM

## 2014-10-24 DIAGNOSIS — F411 Generalized anxiety disorder: Secondary | ICD-10-CM

## 2014-10-24 DIAGNOSIS — F0150 Vascular dementia without behavioral disturbance: Secondary | ICD-10-CM | POA: Diagnosis not present

## 2014-10-24 DIAGNOSIS — E038 Other specified hypothyroidism: Secondary | ICD-10-CM | POA: Diagnosis not present

## 2014-10-24 DIAGNOSIS — E034 Atrophy of thyroid (acquired): Secondary | ICD-10-CM | POA: Diagnosis not present

## 2014-10-24 DIAGNOSIS — I10 Essential (primary) hypertension: Secondary | ICD-10-CM | POA: Diagnosis not present

## 2014-10-24 NOTE — Progress Notes (Signed)
Patient ID: Ebony [name], female   DOB: 10-16-1931, 81 y.o.   MRN: [id number]    Nursing Home Location:  [facility]   Place of Service: SNF (31)  PCP: Margit Hanks, MD  Allergies  Allergen Reactions  . Penicillins Diarrhea  . Ace Inhibitors   . Biaxin [Clarithromycin] Diarrhea  . Sulfa Antibiotics Diarrhea  . Cozaar [Losartan Potassium] Rash    No chief complaint on file.   HPI:  Patient is a 81 y.o. female seen today at [facility] for medical management of her chronic health issues.  Patient has a past medical history of hypertension, DDD, hypothyroidism, OA, anxiety, and hyperlipidemia.  She says that she is feeling well but reports some anxiety and stress over a terminally ill family member.  She also reports low back pain but this is consistent with her previous complaints as patient has known DDD.  She reports that hydrocodone helps with her back pain and gains relief from taking it.   Review of Systems:  Review of Systems  Constitutional: Negative for fever, activity change, appetite change, fatigue and unexpected weight change.  HENT: Negative for congestion, postnasal drip, rhinorrhea and sore throat.   Respiratory: Negative for cough, chest tightness and shortness of breath.   Cardiovascular: Negative for chest pain, palpitations and leg swelling.  Gastrointestinal: Negative for abdominal pain, diarrhea, constipation and abdominal distention.  Genitourinary: Negative for dysuria, urgency and frequency.  Musculoskeletal: Positive for back pain. Negative for arthralgias, gait problem and neck pain.  Skin: Negative for color change, pallor, rash and wound.  Psychiatric/Behavioral: Negative for agitation. The patient is nervous/anxious.     Past Medical History  Diagnosis Date  . Hypertension   . Subarachnoid hemorrhage following injury 05/16/2011  . Unspecified hypothyroidism 04/15/2012  . SDH (subdural hematoma) 05/16/2011  .  Hiatal hernia   . Allergic rhinitis   . Fibrocystic breast disease   . Anxiety   . IFG (impaired fasting glucose)   . Arthritis     Knees  . Hyperlipidemia   . Depression   . Subarachnoid hemorrhage following injury 05/16/2011  . Acute ischemic stroke 04/11/2012   Past Surgical History  Procedure Laterality Date  . Abdominal hysterectomy    . Breast surgery    . Tonsillectomy    . Eye surgery     Social History:   reports that she has never smoked. She does not have any smokeless tobacco history on file. She reports that she does not drink alcohol or use illicit drugs.  Family History  Problem Relation Age of Onset  . CVA Neg Hx   . Diabetes Neg Hx   . Hypertension Neg Hx   . Lung cancer Sister     Medications: Patient's Medications  New Prescriptions   No medications on file  Previous Medications   ACETAMINOPHEN (TYLENOL) 325 MG TABLET    Take 2 tablets (650 mg total) by mouth every 6 (six) hours as needed.   ALPRAZOLAM (XANAX) 0.25 MG TABLET    Take one tablet by mouth at bedtime to help rest, 1 by mouth every 8 hours as needed   ASPIRIN 81 MG CHEWABLE TABLET    Chew 81 mg by mouth daily.   CETIRIZINE (ZYRTEC) 10 MG TABLET    Take 10 mg by mouth daily.   DILTIAZEM (CARDIZEM CD) 120 MG 24 HR CAPSULE    Take 120 mg by mouth daily. for angina.   DOCUSATE SODIUM (COLACE) 100  MG CAPSULE    Take 100 mg by mouth daily as needed for mild constipation.   DONEPEZIL (ARICEPT) 10 MG TABLET    Take 10 mg by mouth at bedtime.   DULOXETINE (CYMBALTA) 60 MG CAPSULE    Take 60 mg by mouth daily.   FLUTICASONE (FLONASE) 50 MCG/ACT NASAL SPRAY    Place 2 sprays into both nostrils daily.   HYDROCODONE-ACETAMINOPHEN (NORCO/VICODIN) 5-325 MG PER TABLET    Take one tablet by mouth every 8 hours as needed for pain   LEVOTHYROXINE (SYNTHROID, LEVOTHROID) 25 MCG TABLET    Take 1 tablet (25 mcg total) by mouth daily before breakfast.   LOPERAMIDE HCL (LOPERAMIDE A-D PO)    Take 4 mg by mouth. Take  after 1 st loose stool as needed   MELATONIN 3 MG CAPS    By mouth qhs for sleep   MEMANTINE (NAMENDA) 10 MG TABLET    Take 10 mg by mouth 2 (two) times daily.   METOPROLOL (TOPROL-XL) 100 MG 24 HR TABLET    Take 100 mg by mouth every morning. for HTN.   MIRTAZAPINE (REMERON) 15 MG TABLET    Take 15 mg by mouth at bedtime.   MULTIPLE VITAMIN (MULITIVITAMIN WITH MINERALS) TABS    Take 1 tablet by mouth every morning.   ONDANSETRON (ZOFRAN) 4 MG TABLET    Take 4 mg by mouth every 6 (six) hours as needed for nausea or vomiting.   SODIUM CHLORIDE (OCEAN) 0.65 % SOLN NASAL SPRAY    Place 2 sprays into both nostrils 4 (four) times daily as needed for congestion.  Modified Medications   No medications on file  Discontinued Medications   No medications on file     Physical Exam: Filed Vitals:   10/24/14 1338  BP: 134/76  Pulse: 69  Temp: 98.6 F (37 C)  TempSrc: Oral  Resp: 18  Weight: 143 lb (64.864 kg)    Physical Exam  Constitutional: She is oriented to person, place, and time. She appears well-developed and well-nourished. No distress.  HENT:  Head: Normocephalic and atraumatic.  Mouth/Throat: Oropharynx is clear and moist. No oropharyngeal exudate.  Eyes: Conjunctivae are normal. Pupils are equal, round, and reactive to light.  Neck: Normal range of motion. Neck supple.  Cardiovascular: Normal rate, regular rhythm and normal heart sounds.   No murmur heard. Pulmonary/Chest: Effort normal and breath sounds normal.  Abdominal: Soft. Bowel sounds are normal.  Musculoskeletal: Normal range of motion.  Lymphadenopathy:    She has no cervical adenopathy.  Neurological: She is alert and oriented to person, place, and time.  Skin: Skin is warm and dry.  Psychiatric: She has a normal mood and affect.    Labs reviewed: Basic Metabolic Panel:  Recent Labs  16/10/96 07/18/14  NA 142 141  K 3.4 4.4  BUN 10 13  CREATININE 0.8 0.7   Liver Function Tests:  Recent Labs   07/18/14  AST 17  ALT 11  ALKPHOS 104   No results for input(s): LIPASE, AMYLASE in the last 8760 hours. No results for input(s): AMMONIA in the last 8760 hours. CBC:  Recent Labs  07/18/14  WBC 7.7  HGB 12.8  HCT 37  PLT 207   TSH:  Recent Labs  07/10/14  TSH 1.44   A1C: No results found for: HGBA1C Lipid Panel:  Recent Labs  07/18/14  CHOL 168  HDL 54  LDLCALC 90  TRIG 122    Assessment/Plan 1. Essential hypertension -  Stable.  Maintained on metoprolol.  Blood pressures are within desirable range.  Patient reports following a healthy diet.   2. Hypothyroidism due to acquired atrophy of thyroid - Stable on Synthroid.  TSH stable.    3. Vascular dementia without behavioral disturbance - Stable.  Patient is oriented and participates in ADL's as well as participates in ceramics class and enjoys coloring.  Maintained on Namenda and Aricept.    4. DDD (degenerative disc disease), lumbar - Stable. Patient reports pain but gains relief with hydrocodone prn.    5. Generalized anxiety disorder - Stable on current medications.  Maintained on cymbalta, alprazolam prn and mirtazapine. Patient says she is sleeping well at night and is only anxious periodically.  Will continue current regimen.       Janene Harvey. Biagio Borg  [facility] [phone] 8 am - 5 pm) [phone] (after hours)

## 2014-11-30 ENCOUNTER — Non-Acute Institutional Stay (SKILLED_NURSING_FACILITY): Payer: Medicare Other | Admitting: Nurse Practitioner

## 2014-11-30 DIAGNOSIS — F411 Generalized anxiety disorder: Secondary | ICD-10-CM

## 2014-11-30 DIAGNOSIS — I10 Essential (primary) hypertension: Secondary | ICD-10-CM

## 2014-11-30 DIAGNOSIS — F0150 Vascular dementia without behavioral disturbance: Secondary | ICD-10-CM

## 2014-11-30 DIAGNOSIS — H66012 Acute suppurative otitis media with spontaneous rupture of ear drum, left ear: Secondary | ICD-10-CM | POA: Diagnosis not present

## 2014-11-30 DIAGNOSIS — R197 Diarrhea, unspecified: Secondary | ICD-10-CM

## 2014-11-30 DIAGNOSIS — M25512 Pain in left shoulder: Secondary | ICD-10-CM

## 2014-11-30 DIAGNOSIS — B351 Tinea unguium: Secondary | ICD-10-CM

## 2014-11-30 NOTE — Progress Notes (Signed)
Patient ID: Ebony [name], female   DOB: [birth year], 81 y.o.   MRN: [id number]    Nursing Home Location:  [facility]   Place of Service: SNF (31)  PCP: Margit Hanks, MD  Allergies  Allergen Reactions  . Penicillins Diarrhea  . Ace Inhibitors   . Biaxin [Clarithromycin] Diarrhea  . Sulfa Antibiotics Diarrhea  . Cozaar [Losartan Potassium] Rash    Chief Complaint  Patient presents with  . Medical Management of Chronic Issues  . Acute Visit    HPI:  Patient is a 81 y.o. female seen today at [facility] for medical management of her chronic health issues.  Patient has a past medical history of hypertension, DDD, hypothyroidism, OA, anxiety, and hyperlipidemia.   Pt with multiple complaints 1) pt reports she has been having severe left ear pain. Reports it hurt so bad last night she was crying and screaming. Reports the pain was in her left ear radiating down her neck. Could not even touch ear or surrounding area without severe pain. Nurse gave her some medication around 1 am but pain did not subside until 3 am. Currently not having any pain. Reports last night ear was cracking and popping and then noted drainage. Place a q-tip at the edge of her ear and noted blood. Feels like there is a lot of fluid in her ear now.  2) pt reports earlier this week/last week was trying to get out of bed and was pulling up on the bed rail with her left arm and felt a pop, reports she has been having increased pain to her arm since. Went by therapy to ask if she could use a weight to help strengthen arm but they told her to hold off.  3) pt reports thick toe nails to left foot. Unable to cut and would like them trimmed.  4) pt complaints of diarrhea and nausea. No vomiting. Ate yogurt for dinner last night and tolerated well   Overall feels poorly. Tolerating liquid and able to stay hydrated Review of Systems:  Review of Systems  Constitutional: Positive for activity  change, appetite change and fatigue. Negative for fever and unexpected weight change.  HENT: Positive for ear discharge, ear pain, hearing loss and sinus pressure. Negative for congestion, postnasal drip, rhinorrhea and sore throat.   Respiratory: Negative for cough, chest tightness and shortness of breath.   Cardiovascular: Negative for chest pain, palpitations and leg swelling.  Gastrointestinal: Positive for nausea and diarrhea. Negative for abdominal pain, constipation and abdominal distention.  Genitourinary: Negative for dysuria, urgency and frequency.  Musculoskeletal: Positive for back pain and arthralgias. Negative for gait problem and neck pain.       Pain to left shoulder and upper arm  Skin: Negative for color change, pallor, rash and wound.  Psychiatric/Behavioral: Negative for agitation. The patient is nervous/anxious.     Past Medical History  Diagnosis Date  . Hypertension   . Subarachnoid hemorrhage following injury 05/16/2011  . Unspecified hypothyroidism 04/15/2012  . SDH (subdural hematoma) 05/16/2011  . Hiatal hernia   . Allergic rhinitis   . Fibrocystic breast disease   . Anxiety   . IFG (impaired fasting glucose)   . Arthritis     Knees  . Hyperlipidemia   . Depression   . Subarachnoid hemorrhage following injury 05/16/2011  . Acute ischemic stroke 04/11/2012   Past Surgical History  Procedure Laterality Date  . Abdominal hysterectomy    . Breast surgery    .  Tonsillectomy    . Eye surgery     Social History:   reports that she has never smoked. She does not have any smokeless tobacco history on file. She reports that she does not drink alcohol or use illicit drugs.  Family History  Problem Relation Age of Onset  . CVA Neg Hx   . Diabetes Neg Hx   . Hypertension Neg Hx   . Lung cancer Sister     Medications: Patient's Medications  New Prescriptions   No medications on file  Previous Medications   ACETAMINOPHEN (TYLENOL) 325 MG TABLET    Take 2  tablets (650 mg total) by mouth every 6 (six) hours as needed.   ALPRAZOLAM (XANAX) 0.25 MG TABLET    Take one tablet by mouth at bedtime to help rest, 1 by mouth every 8 hours as needed   ASPIRIN 81 MG CHEWABLE TABLET    Chew 81 mg by mouth daily.   CETIRIZINE (ZYRTEC) 10 MG TABLET    Take 10 mg by mouth daily.   DILTIAZEM (CARDIZEM CD) 120 MG 24 HR CAPSULE    Take 120 mg by mouth daily. for angina.   DOCUSATE SODIUM (COLACE) 100 MG CAPSULE    Take 100 mg by mouth daily as needed for mild constipation.   DONEPEZIL (ARICEPT) 10 MG TABLET    Take 10 mg by mouth daily.    DULOXETINE (CYMBALTA) 60 MG CAPSULE    Take 60 mg by mouth daily.   FLUTICASONE (FLONASE) 50 MCG/ACT NASAL SPRAY    Place 2 sprays into both nostrils daily.   HYDROCODONE-ACETAMINOPHEN (NORCO/VICODIN) 5-325 MG PER TABLET    Take one tablet by mouth every 8 hours as needed for pain   LEVOTHYROXINE (SYNTHROID, LEVOTHROID) 25 MCG TABLET    Take 1 tablet (25 mcg total) by mouth daily before breakfast.   LOPERAMIDE HCL (LOPERAMIDE A-D PO)    Take 4 mg by mouth. Take after 1 st loose stool as needed   MELATONIN 3 MG CAPS    By mouth qhs for sleep   MEMANTINE (NAMENDA) 10 MG TABLET    Take 10 mg by mouth 2 (two) times daily.   METOPROLOL (TOPROL-XL) 100 MG 24 HR TABLET    Take 100 mg by mouth every morning. for HTN.   MIRTAZAPINE (REMERON) 15 MG TABLET    Take 15 mg by mouth at bedtime.   MULTIPLE VITAMIN (MULITIVITAMIN WITH MINERALS) TABS    Take 1 tablet by mouth every morning.   ONDANSETRON (ZOFRAN) 4 MG TABLET    Take 4 mg by mouth every 6 (six) hours as needed for nausea or vomiting.   SODIUM CHLORIDE (OCEAN) 0.65 % SOLN NASAL SPRAY    Place 2 sprays into both nostrils 4 (four) times daily as needed for congestion.  Modified Medications   No medications on file  Discontinued Medications   No medications on file     Physical Exam: Filed Vitals:   11/30/14 1258  BP: 120/68  Pulse: 86  Temp: 98 F (36.7 C)  Resp: 18     Physical Exam  Constitutional: She is oriented to person, place, and time. No distress.  Appears as if she feels poorly  HENT:  Head: Normocephalic and atraumatic.  Right Ear: Hearing, tympanic membrane, external ear and ear canal normal.  Left Ear: There is drainage. No tenderness. Tympanic membrane is perforated.  Nose: Nose normal.  Mouth/Throat: Oropharynx is clear and moist. No oropharyngeal exudate.  serosanguinous drainage  to left ear. Decreased hearing   Eyes: Conjunctivae are normal. Pupils are equal, round, and reactive to light.  Neck: Normal range of motion. Neck supple.  Cardiovascular: Normal rate, regular rhythm and normal heart sounds.   No murmur heard. Pulmonary/Chest: Effort normal and breath sounds normal.  Abdominal: Soft. Bowel sounds are normal. She exhibits no distension. There is no tenderness.  Musculoskeletal: Normal range of motion. She exhibits tenderness (left shoulder). She exhibits no edema.  Weakness and limited ROM to left shoulder   Lymphadenopathy:    She has no cervical adenopathy.  Neurological: She is alert and oriented to person, place, and time.  Skin: Skin is warm and dry.  Psychiatric: She has a normal mood and affect.    Labs reviewed: Basic Metabolic Panel:  Recent Labs  40/98/11  NA 141  K 4.4  BUN 13  CREATININE 0.7   Liver Function Tests:  Recent Labs  07/18/14  AST 17  ALT 11  ALKPHOS 104   No results for input(s): LIPASE, AMYLASE in the last 8760 hours. No results for input(s): AMMONIA in the last 8760 hours. CBC:  Recent Labs  07/18/14  WBC 7.7  HGB 12.8  HCT 37  PLT 207   TSH:  Recent Labs  07/10/14  TSH 1.44   A1C: No results found for: HGBA1C Lipid Panel:  Recent Labs  07/18/14  CHOL 168  HDL 54  LDLCALC 90  TRIG 122    Assessment/Plan  1. Acute suppurative otitis media of left ear with spontaneous rupture of tympanic membrane, recurrence not specified -discussed with Dr Lyn Hollingshead.  Pt with rupture of membrane most likely why she was experiencing increased pain that has now improved. Pt with both pcn and sulfa allergies. Will start azithromycin 500 mg today and 250 mg x 4 days. Will have ENT evaluate.  -VS q shift for 1 week.  -will follow up bmp and cbc  2. Left shoulder pain After pulling up on the bed rail. Will have OT eval and treat for now- possible strain vs rotator cuff tear-may need ortho referral and further imaging.   3. Onychomycosis Thick brittle overgrown toe nails to left foot. Will place podiatry referral   4. Diarrhea -most likely due to acute illness with acute otitis media, encouraged bland diet advanced as tolerated. Drinking 8 oz fluid for any loose stool  5. Generalized anxiety disorder - Stable on current medications.  Maintained on cymbalta, alprazolam prn and mirtazapine. Will continue current regimen  6. Vascular dementia without behavioral disturbance -functional and cognitive status remains stable, conts on aricept and namenda   7. Essential hypertension - Stable.  Maintained on metoprolol and Cardizem.  Blood pressures are within desirable range.  Patient reports following a healthy diet.      Janene Harvey. Biagio Borg  [facility] [phone] 8 am - 5 pm) [phone] (after hours)

## 2014-12-12 ENCOUNTER — Other Ambulatory Visit: Payer: Self-pay

## 2014-12-12 MED ORDER — ALPRAZOLAM 0.25 MG PO TABS: ORAL_TABLET | ORAL | Status: DC

## 2014-12-12 NOTE — Telephone Encounter (Signed)
Faxed to [facility] Fax Number: [phone], Phone Number [phone]  

## 2015-01-04 ENCOUNTER — Non-Acute Institutional Stay (SKILLED_NURSING_FACILITY): Payer: Medicare Other | Admitting: Nurse Practitioner

## 2015-01-04 DIAGNOSIS — F0150 Vascular dementia without behavioral disturbance: Secondary | ICD-10-CM | POA: Diagnosis not present

## 2015-01-04 DIAGNOSIS — I4891 Unspecified atrial fibrillation: Secondary | ICD-10-CM

## 2015-01-04 DIAGNOSIS — F329 Major depressive disorder, single episode, unspecified: Secondary | ICD-10-CM | POA: Diagnosis not present

## 2015-01-04 DIAGNOSIS — E038 Other specified hypothyroidism: Secondary | ICD-10-CM | POA: Diagnosis not present

## 2015-01-04 DIAGNOSIS — E034 Atrophy of thyroid (acquired): Secondary | ICD-10-CM | POA: Diagnosis not present

## 2015-01-04 DIAGNOSIS — I10 Essential (primary) hypertension: Secondary | ICD-10-CM | POA: Diagnosis not present

## 2015-01-04 DIAGNOSIS — F32A Depression, unspecified: Secondary | ICD-10-CM

## 2015-01-04 NOTE — Progress Notes (Signed)
Nursing Home Location:  [name]   Place of Service: SNF (31)  PCP: [name], Monica, DO  Allergies  Allergen Reactions  . Penicillins Diarrhea  . Ace Inhibitors   . Biaxin [Clarithromycin] Diarrhea  . Sulfa Antibiotics Diarrhea  . Cozaar [Losartan Potassium] Rash    Chief Complaint  Patient presents with  . Medical Management of Chronic Issues    HPI:  Patient is a 81 y.o. female seen today at [name] for medical management of her chronic issues.  [name] has a past medical history of HTN, HL, Afib, and anxiety.  She is seen today in her room, sitting up eating breakfast.  She feels well.  She was treated for a spontaneous rupture of her left tympanic membrane on 11/30/14.  She reports that her ear feels much better.   Review of Systems:  Review of Systems  Constitutional: Negative for fever, chills and fatigue.  HENT: Negative for congestion, ear pain, postnasal drip and rhinorrhea.   Respiratory: Negative for cough, shortness of breath, wheezing and stridor.   Cardiovascular: Negative for chest pain, palpitations and leg swelling.  Gastrointestinal: Positive for constipation (Says she is constipated today, but says this usually resolves. ). Negative for nausea, abdominal pain and diarrhea.  Genitourinary: Negative for dysuria, hematuria and pelvic pain.  Musculoskeletal: Positive for myalgias (Chronic back pain ). Negative for arthralgias, gait problem and neck pain.  Neurological: Negative for dizziness, light-headedness and headaches.  Psychiatric/Behavioral: Negative for agitation. The patient is not nervous/anxious.     Past Medical History  Diagnosis Date  . Hypertension   . Subarachnoid hemorrhage following injury 05/16/2011  . Unspecified hypothyroidism 04/15/2012  . SDH (subdural hematoma) 05/16/2011  . Hiatal hernia   . Allergic rhinitis   . Fibrocystic breast disease   . Anxiety   . IFG (impaired fasting glucose)     . Arthritis     Knees  . Hyperlipidemia   . Depression   . Subarachnoid hemorrhage following injury 05/16/2011  . Acute ischemic stroke 04/11/2012   Past Surgical History  Procedure Laterality Date  . Abdominal hysterectomy    . Breast surgery    . Tonsillectomy    . Eye surgery     Social History:   reports that she has never smoked. She does not have any smokeless tobacco history on file. She reports that she does not drink alcohol or use illicit drugs.  Family History  Problem Relation Age of Onset  . CVA Neg Hx   . Diabetes Neg Hx   . Hypertension Neg Hx   . Lung cancer Sister     Medications: Patient's Medications  New Prescriptions   No medications on file  Previous Medications   ACETAMINOPHEN (TYLENOL) 325 MG TABLET    Take 2 tablets (650 mg total) by mouth every 6 (six) hours as needed.   ALPRAZOLAM (XANAX) 0.25 MG TABLET    1 by mouth every 8 hours as needed for anxiety control   ASPIRIN 81 MG CHEWABLE TABLET    Chew 81 mg by mouth daily.   CETIRIZINE (ZYRTEC) 10 MG TABLET    Take 10 mg by mouth daily.   DILTIAZEM (CARDIZEM CD) 120 MG 24 HR CAPSULE    Take 120 mg by mouth daily. for angina.   DOCUSATE SODIUM (COLACE) 100 MG CAPSULE    Take 100 mg by mouth daily as needed for mild constipation.   DONEPEZIL (ARICEPT) 10 MG TABLET  Take 10 mg by mouth daily.    DULOXETINE (CYMBALTA) 60 MG CAPSULE    Take 60 mg by mouth daily.   FLUTICASONE (FLONASE) 50 MCG/ACT NASAL SPRAY    Place 2 sprays into both nostrils daily.   HYDROCODONE-ACETAMINOPHEN (NORCO/VICODIN) 5-325 MG PER TABLET    Take one tablet by mouth every 8 hours as needed for pain   LEVOTHYROXINE (SYNTHROID, LEVOTHROID) 25 MCG TABLET    Take 1 tablet (25 mcg total) by mouth daily before breakfast.   LOPERAMIDE HCL (LOPERAMIDE A-D PO)    Take 4 mg by mouth. Take after 1 st loose stool as needed   MELATONIN 3 MG CAPS    By mouth qhs for sleep   MEMANTINE (NAMENDA) 10 MG TABLET    Take 10 mg by mouth 2 (two)  times daily.   METOPROLOL (TOPROL-XL) 100 MG 24 HR TABLET    Take 100 mg by mouth every morning. for HTN.   MIRTAZAPINE (REMERON) 15 MG TABLET    Take 15 mg by mouth at bedtime.   MULTIPLE VITAMIN (MULITIVITAMIN WITH MINERALS) TABS    Take 1 tablet by mouth every morning.   ONDANSETRON (ZOFRAN) 4 MG TABLET    Take 4 mg by mouth every 6 (six) hours as needed for nausea or vomiting.   SODIUM CHLORIDE (OCEAN) 0.65 % SOLN NASAL SPRAY    Place 2 sprays into both nostrils 4 (four) times daily as needed for congestion.  Modified Medications   No medications on file  Discontinued Medications   No medications on file     Physical Exam: Filed Vitals:   01/04/15 1406  BP: 128/72  Pulse: 76  Temp: 97.2 F (36.2 C)  Resp: 20  Weight: 149 lb (67.586 kg)    Physical Exam  Constitutional: She is oriented to person, place, and time. She appears well-developed and well-nourished.  HENT:  Head: Normocephalic and atraumatic.  Right Ear: External ear normal.  Left Ear: External ear normal.  Mouth/Throat: Oropharynx is clear and moist. No oropharyngeal exudate.  Eyes: Pupils are equal, round, and reactive to light.  Neck: Normal range of motion. Neck supple.  Cardiovascular: Normal rate, regular rhythm, normal heart sounds and intact distal pulses.   No murmur heard. Pulmonary/Chest: Effort normal and breath sounds normal.  Abdominal: Soft. Bowel sounds are normal. There is no tenderness.  Musculoskeletal: Normal range of motion.  Lymphadenopathy:    She has no cervical adenopathy.  Neurological: She is alert and oriented to person, place, and time.  Skin: Skin is warm and dry.  Psychiatric: She has a normal mood and affect.    Labs reviewed: Basic Metabolic Panel:  Recent Labs  16/10/96  NA 141  K 4.4  BUN 13  CREATININE 0.7   Liver Function Tests:  Recent Labs  07/18/14  AST 17  ALT 11  ALKPHOS 104   No results for input(s): LIPASE, AMYLASE in the last 8760 hours. No  results for input(s): AMMONIA in the last 8760 hours. CBC:  Recent Labs  07/18/14  WBC 7.7  HGB 12.8  HCT 37  PLT 207   TSH:  Recent Labs  07/10/14  TSH 1.44   A1C: No results found for: HGBA1C Lipid Panel:  Recent Labs  07/18/14  CHOL 168  HDL 54  LDLCALC 90  TRIG 122    Assessment/Plan 1. Atrial fibrillation with rapid ventricular response (HCC) Stable.  Rate controlled.  Continue diltiazem.   2. Essential hypertension Stable.  Blood  pressures within desirable range. Continue metoprolol. Renal function within normal range.   3. Hypothyroidism due to acquired atrophy of thyroid Stable. Continue current dose of Synthroid.   4. Vascular dementia without behavioral disturbance Stable.  Patient is still able to participate in her own ADL's.  No behaviors reported from nursing.  Patient is able to answer questions appropriately. Continue Aricept and Namenda.   5. Depression Stable.  Patient is participating in group activities. Continue Cymbalta.      Janene Harvey. Biagio Borg  [facility] [phone] 8 am - 5 pm) [phone] (after hours)

## 2015-04-11 DIAGNOSIS — R2681 Unsteadiness on feet: Secondary | ICD-10-CM | POA: Diagnosis not present

## 2015-04-11 DIAGNOSIS — M6281 Muscle weakness (generalized): Secondary | ICD-10-CM | POA: Diagnosis not present

## 2015-04-11 DIAGNOSIS — R296 Repeated falls: Secondary | ICD-10-CM | POA: Diagnosis not present

## 2015-04-11 DIAGNOSIS — R29898 Other symptoms and signs involving the musculoskeletal system: Secondary | ICD-10-CM | POA: Diagnosis not present

## 2015-04-25 DIAGNOSIS — R2681 Unsteadiness on feet: Secondary | ICD-10-CM | POA: Diagnosis not present

## 2015-04-25 DIAGNOSIS — R296 Repeated falls: Secondary | ICD-10-CM | POA: Diagnosis not present

## 2015-04-25 DIAGNOSIS — R29898 Other symptoms and signs involving the musculoskeletal system: Secondary | ICD-10-CM | POA: Diagnosis not present

## 2015-04-25 DIAGNOSIS — M6281 Muscle weakness (generalized): Secondary | ICD-10-CM | POA: Diagnosis not present

## 2015-04-30 DIAGNOSIS — M6281 Muscle weakness (generalized): Secondary | ICD-10-CM | POA: Diagnosis not present

## 2015-04-30 DIAGNOSIS — R296 Repeated falls: Secondary | ICD-10-CM | POA: Diagnosis not present

## 2015-04-30 DIAGNOSIS — R2681 Unsteadiness on feet: Secondary | ICD-10-CM | POA: Diagnosis not present

## 2015-04-30 DIAGNOSIS — R29898 Other symptoms and signs involving the musculoskeletal system: Secondary | ICD-10-CM | POA: Diagnosis not present

## 2015-05-14 DIAGNOSIS — N183 Chronic kidney disease, stage 3 (moderate): Secondary | ICD-10-CM | POA: Diagnosis not present

## 2015-05-14 DIAGNOSIS — G309 Alzheimer's disease, unspecified: Secondary | ICD-10-CM | POA: Diagnosis not present

## 2015-05-14 DIAGNOSIS — E538 Deficiency of other specified B group vitamins: Secondary | ICD-10-CM | POA: Diagnosis not present

## 2015-05-14 DIAGNOSIS — R29898 Other symptoms and signs involving the musculoskeletal system: Secondary | ICD-10-CM | POA: Diagnosis not present

## 2015-05-14 DIAGNOSIS — E119 Type 2 diabetes mellitus without complications: Secondary | ICD-10-CM | POA: Diagnosis not present

## 2015-05-14 DIAGNOSIS — J309 Allergic rhinitis, unspecified: Secondary | ICD-10-CM | POA: Diagnosis not present

## 2015-05-14 DIAGNOSIS — E782 Mixed hyperlipidemia: Secondary | ICD-10-CM | POA: Diagnosis not present

## 2015-05-14 DIAGNOSIS — R2681 Unsteadiness on feet: Secondary | ICD-10-CM | POA: Diagnosis not present

## 2015-05-14 DIAGNOSIS — M6281 Muscle weakness (generalized): Secondary | ICD-10-CM | POA: Diagnosis not present

## 2015-05-14 DIAGNOSIS — E039 Hypothyroidism, unspecified: Secondary | ICD-10-CM | POA: Diagnosis not present

## 2015-05-14 DIAGNOSIS — R69 Illness, unspecified: Secondary | ICD-10-CM | POA: Diagnosis not present

## 2015-05-14 DIAGNOSIS — I4891 Unspecified atrial fibrillation: Secondary | ICD-10-CM | POA: Diagnosis not present

## 2015-05-14 DIAGNOSIS — M545 Low back pain: Secondary | ICD-10-CM | POA: Diagnosis not present

## 2015-05-14 DIAGNOSIS — R296 Repeated falls: Secondary | ICD-10-CM | POA: Diagnosis not present

## 2015-05-14 DIAGNOSIS — I10 Essential (primary) hypertension: Secondary | ICD-10-CM | POA: Diagnosis not present

## 2015-05-14 DIAGNOSIS — E559 Vitamin D deficiency, unspecified: Secondary | ICD-10-CM | POA: Diagnosis not present

## 2015-05-16 DIAGNOSIS — R296 Repeated falls: Secondary | ICD-10-CM | POA: Diagnosis not present

## 2015-05-16 DIAGNOSIS — M6281 Muscle weakness (generalized): Secondary | ICD-10-CM | POA: Diagnosis not present

## 2015-05-16 DIAGNOSIS — R29898 Other symptoms and signs involving the musculoskeletal system: Secondary | ICD-10-CM | POA: Diagnosis not present

## 2015-05-16 DIAGNOSIS — R2681 Unsteadiness on feet: Secondary | ICD-10-CM | POA: Diagnosis not present

## 2015-05-21 DIAGNOSIS — R29898 Other symptoms and signs involving the musculoskeletal system: Secondary | ICD-10-CM | POA: Diagnosis not present

## 2015-05-21 DIAGNOSIS — R296 Repeated falls: Secondary | ICD-10-CM | POA: Diagnosis not present

## 2015-05-21 DIAGNOSIS — M6281 Muscle weakness (generalized): Secondary | ICD-10-CM | POA: Diagnosis not present

## 2015-05-21 DIAGNOSIS — R2681 Unsteadiness on feet: Secondary | ICD-10-CM | POA: Diagnosis not present

## 2015-05-23 DIAGNOSIS — M6281 Muscle weakness (generalized): Secondary | ICD-10-CM | POA: Diagnosis not present

## 2015-05-23 DIAGNOSIS — R2681 Unsteadiness on feet: Secondary | ICD-10-CM | POA: Diagnosis not present

## 2015-05-23 DIAGNOSIS — R29898 Other symptoms and signs involving the musculoskeletal system: Secondary | ICD-10-CM | POA: Diagnosis not present

## 2015-05-23 DIAGNOSIS — R296 Repeated falls: Secondary | ICD-10-CM | POA: Diagnosis not present

## 2015-05-28 DIAGNOSIS — L600 Ingrowing nail: Secondary | ICD-10-CM | POA: Diagnosis not present

## 2015-05-28 DIAGNOSIS — B351 Tinea unguium: Secondary | ICD-10-CM | POA: Diagnosis not present

## 2015-06-11 DIAGNOSIS — R69 Illness, unspecified: Secondary | ICD-10-CM | POA: Diagnosis not present

## 2015-06-11 DIAGNOSIS — G3184 Mild cognitive impairment, so stated: Secondary | ICD-10-CM | POA: Diagnosis not present

## 2015-06-11 DIAGNOSIS — R194 Change in bowel habit: Secondary | ICD-10-CM | POA: Diagnosis not present

## 2015-06-18 DIAGNOSIS — R194 Change in bowel habit: Secondary | ICD-10-CM | POA: Diagnosis not present

## 2015-06-18 DIAGNOSIS — E039 Hypothyroidism, unspecified: Secondary | ICD-10-CM | POA: Diagnosis not present

## 2015-06-18 DIAGNOSIS — G3184 Mild cognitive impairment, so stated: Secondary | ICD-10-CM | POA: Diagnosis not present

## 2015-06-18 DIAGNOSIS — I10 Essential (primary) hypertension: Secondary | ICD-10-CM | POA: Diagnosis not present

## 2015-06-18 DIAGNOSIS — R69 Illness, unspecified: Secondary | ICD-10-CM | POA: Diagnosis not present

## 2015-06-18 DIAGNOSIS — I4891 Unspecified atrial fibrillation: Secondary | ICD-10-CM | POA: Diagnosis not present

## 2015-10-01 DIAGNOSIS — J309 Allergic rhinitis, unspecified: Secondary | ICD-10-CM | POA: Diagnosis not present

## 2015-10-01 DIAGNOSIS — I10 Essential (primary) hypertension: Secondary | ICD-10-CM | POA: Diagnosis not present

## 2015-10-01 DIAGNOSIS — R110 Nausea: Secondary | ICD-10-CM | POA: Diagnosis not present

## 2015-10-01 DIAGNOSIS — E039 Hypothyroidism, unspecified: Secondary | ICD-10-CM | POA: Diagnosis not present

## 2015-10-01 DIAGNOSIS — R69 Illness, unspecified: Secondary | ICD-10-CM | POA: Diagnosis not present

## 2015-10-01 DIAGNOSIS — G3184 Mild cognitive impairment, so stated: Secondary | ICD-10-CM | POA: Diagnosis not present

## 2015-10-01 DIAGNOSIS — K5900 Constipation, unspecified: Secondary | ICD-10-CM | POA: Diagnosis not present

## 2015-10-01 DIAGNOSIS — I4891 Unspecified atrial fibrillation: Secondary | ICD-10-CM | POA: Diagnosis not present

## 2015-10-08 DIAGNOSIS — E119 Type 2 diabetes mellitus without complications: Secondary | ICD-10-CM | POA: Diagnosis not present

## 2015-10-08 DIAGNOSIS — E039 Hypothyroidism, unspecified: Secondary | ICD-10-CM | POA: Diagnosis not present

## 2015-10-29 DIAGNOSIS — R194 Change in bowel habit: Secondary | ICD-10-CM | POA: Diagnosis not present

## 2015-10-29 DIAGNOSIS — R69 Illness, unspecified: Secondary | ICD-10-CM | POA: Diagnosis not present

## 2015-10-29 DIAGNOSIS — Z79899 Other long term (current) drug therapy: Secondary | ICD-10-CM | POA: Diagnosis not present

## 2015-10-29 DIAGNOSIS — I10 Essential (primary) hypertension: Secondary | ICD-10-CM | POA: Diagnosis not present

## 2015-10-29 DIAGNOSIS — Q845 Enlarged and hypertrophic nails: Secondary | ICD-10-CM | POA: Diagnosis not present

## 2015-10-29 DIAGNOSIS — G894 Chronic pain syndrome: Secondary | ICD-10-CM | POA: Diagnosis not present

## 2015-11-25 DIAGNOSIS — B351 Tinea unguium: Secondary | ICD-10-CM | POA: Diagnosis not present

## 2015-11-28 DIAGNOSIS — Z23 Encounter for immunization: Secondary | ICD-10-CM | POA: Diagnosis not present

## 2015-12-17 DIAGNOSIS — R69 Illness, unspecified: Secondary | ICD-10-CM | POA: Diagnosis not present

## 2015-12-17 DIAGNOSIS — Z79899 Other long term (current) drug therapy: Secondary | ICD-10-CM | POA: Diagnosis not present

## 2015-12-17 DIAGNOSIS — G894 Chronic pain syndrome: Secondary | ICD-10-CM | POA: Diagnosis not present

## 2015-12-17 DIAGNOSIS — I10 Essential (primary) hypertension: Secondary | ICD-10-CM | POA: Diagnosis not present

## 2015-12-17 DIAGNOSIS — E039 Hypothyroidism, unspecified: Secondary | ICD-10-CM | POA: Diagnosis not present

## 2016-02-06 DIAGNOSIS — M170 Bilateral primary osteoarthritis of knee: Secondary | ICD-10-CM | POA: Diagnosis not present

## 2016-02-06 DIAGNOSIS — M25562 Pain in left knee: Secondary | ICD-10-CM | POA: Diagnosis not present

## 2016-02-07 DIAGNOSIS — B351 Tinea unguium: Secondary | ICD-10-CM | POA: Diagnosis not present

## 2016-02-21 DIAGNOSIS — R296 Repeated falls: Secondary | ICD-10-CM | POA: Diagnosis not present

## 2016-02-21 DIAGNOSIS — M6281 Muscle weakness (generalized): Secondary | ICD-10-CM | POA: Diagnosis not present

## 2016-02-26 DIAGNOSIS — R296 Repeated falls: Secondary | ICD-10-CM | POA: Diagnosis not present

## 2016-02-26 DIAGNOSIS — M6281 Muscle weakness (generalized): Secondary | ICD-10-CM | POA: Diagnosis not present

## 2016-02-28 DIAGNOSIS — R296 Repeated falls: Secondary | ICD-10-CM | POA: Diagnosis not present

## 2016-02-28 DIAGNOSIS — M6281 Muscle weakness (generalized): Secondary | ICD-10-CM | POA: Diagnosis not present

## 2016-03-04 DIAGNOSIS — R296 Repeated falls: Secondary | ICD-10-CM | POA: Diagnosis not present

## 2016-03-04 DIAGNOSIS — M6281 Muscle weakness (generalized): Secondary | ICD-10-CM | POA: Diagnosis not present

## 2016-03-09 DIAGNOSIS — M6281 Muscle weakness (generalized): Secondary | ICD-10-CM | POA: Diagnosis not present

## 2016-03-09 DIAGNOSIS — R296 Repeated falls: Secondary | ICD-10-CM | POA: Diagnosis not present

## 2016-03-10 DIAGNOSIS — R296 Repeated falls: Secondary | ICD-10-CM | POA: Diagnosis not present

## 2016-03-10 DIAGNOSIS — M6281 Muscle weakness (generalized): Secondary | ICD-10-CM | POA: Diagnosis not present

## 2016-03-12 DIAGNOSIS — M6281 Muscle weakness (generalized): Secondary | ICD-10-CM | POA: Diagnosis not present

## 2016-03-12 DIAGNOSIS — R296 Repeated falls: Secondary | ICD-10-CM | POA: Diagnosis not present

## 2016-03-16 DIAGNOSIS — N390 Urinary tract infection, site not specified: Secondary | ICD-10-CM | POA: Diagnosis not present

## 2016-03-17 DIAGNOSIS — R413 Other amnesia: Secondary | ICD-10-CM | POA: Diagnosis not present

## 2016-03-17 DIAGNOSIS — I10 Essential (primary) hypertension: Secondary | ICD-10-CM | POA: Diagnosis not present

## 2016-03-17 DIAGNOSIS — G894 Chronic pain syndrome: Secondary | ICD-10-CM | POA: Diagnosis not present

## 2016-03-17 DIAGNOSIS — R194 Change in bowel habit: Secondary | ICD-10-CM | POA: Diagnosis not present

## 2016-03-17 DIAGNOSIS — R69 Illness, unspecified: Secondary | ICD-10-CM | POA: Diagnosis not present

## 2016-03-17 DIAGNOSIS — R300 Dysuria: Secondary | ICD-10-CM | POA: Diagnosis not present

## 2016-03-17 DIAGNOSIS — R296 Repeated falls: Secondary | ICD-10-CM | POA: Diagnosis not present

## 2016-03-17 DIAGNOSIS — G309 Alzheimer's disease, unspecified: Secondary | ICD-10-CM | POA: Diagnosis not present

## 2016-03-17 DIAGNOSIS — E039 Hypothyroidism, unspecified: Secondary | ICD-10-CM | POA: Diagnosis not present

## 2016-03-17 DIAGNOSIS — M6281 Muscle weakness (generalized): Secondary | ICD-10-CM | POA: Diagnosis not present

## 2016-03-24 DIAGNOSIS — M6281 Muscle weakness (generalized): Secondary | ICD-10-CM | POA: Diagnosis not present

## 2016-03-24 DIAGNOSIS — R296 Repeated falls: Secondary | ICD-10-CM | POA: Diagnosis not present

## 2016-03-26 DIAGNOSIS — M6281 Muscle weakness (generalized): Secondary | ICD-10-CM | POA: Diagnosis not present

## 2016-03-26 DIAGNOSIS — R296 Repeated falls: Secondary | ICD-10-CM | POA: Diagnosis not present

## 2016-03-31 ENCOUNTER — Emergency Department (HOSPITAL_COMMUNITY): Payer: Medicare HMO

## 2016-03-31 ENCOUNTER — Encounter (HOSPITAL_COMMUNITY): Payer: Self-pay | Admitting: *Deleted

## 2016-03-31 ENCOUNTER — Emergency Department (HOSPITAL_COMMUNITY)
Admission: EM | Admit: 2016-03-31 | Discharge: 2016-04-01 | Disposition: A | Discharge status: Home / Self Care | Payer: Medicare HMO | Attending: Emergency Medicine | Admitting: Emergency Medicine

## 2016-03-31 DIAGNOSIS — I10 Essential (primary) hypertension: Secondary | ICD-10-CM | POA: Diagnosis not present

## 2016-03-31 DIAGNOSIS — S199XXA Unspecified injury of neck, initial encounter: Secondary | ICD-10-CM | POA: Diagnosis not present

## 2016-03-31 DIAGNOSIS — Z7982 Long term (current) use of aspirin: Secondary | ICD-10-CM | POA: Diagnosis not present

## 2016-03-31 DIAGNOSIS — M6281 Muscle weakness (generalized): Secondary | ICD-10-CM | POA: Diagnosis not present

## 2016-03-31 DIAGNOSIS — Z79899 Other long term (current) drug therapy: Secondary | ICD-10-CM | POA: Insufficient documentation

## 2016-03-31 DIAGNOSIS — R030 Elevated blood-pressure reading, without diagnosis of hypertension: Secondary | ICD-10-CM | POA: Diagnosis not present

## 2016-03-31 DIAGNOSIS — Z8673 Personal history of transient ischemic attack (TIA), and cerebral infarction without residual deficits: Secondary | ICD-10-CM | POA: Insufficient documentation

## 2016-03-31 DIAGNOSIS — R296 Repeated falls: Secondary | ICD-10-CM | POA: Diagnosis not present

## 2016-03-31 DIAGNOSIS — Y9389 Activity, other specified: Secondary | ICD-10-CM | POA: Insufficient documentation

## 2016-03-31 DIAGNOSIS — Y999 Unspecified external cause status: Secondary | ICD-10-CM | POA: Insufficient documentation

## 2016-03-31 DIAGNOSIS — M25562 Pain in left knee: Secondary | ICD-10-CM | POA: Diagnosis not present

## 2016-03-31 DIAGNOSIS — W07XXXA Fall from chair, initial encounter: Secondary | ICD-10-CM | POA: Diagnosis not present

## 2016-03-31 DIAGNOSIS — S8002XA Contusion of left knee, initial encounter: Secondary | ICD-10-CM | POA: Insufficient documentation

## 2016-03-31 DIAGNOSIS — Z23 Encounter for immunization: Secondary | ICD-10-CM | POA: Insufficient documentation

## 2016-03-31 DIAGNOSIS — S0990XA Unspecified injury of head, initial encounter: Secondary | ICD-10-CM | POA: Diagnosis present

## 2016-03-31 DIAGNOSIS — Y9289 Other specified places as the place of occurrence of the external cause: Secondary | ICD-10-CM | POA: Diagnosis not present

## 2016-03-31 DIAGNOSIS — S01112A Laceration without foreign body of left eyelid and periocular area, initial encounter: Secondary | ICD-10-CM | POA: Insufficient documentation

## 2016-03-31 DIAGNOSIS — S0181XA Laceration without foreign body of other part of head, initial encounter: Secondary | ICD-10-CM

## 2016-03-31 MED ORDER — IBUPROFEN 200 MG PO TABS
400.0000 mg | ORAL_TABLET | Freq: Once | ORAL | Status: AC
  Administered 2016-03-31: 400 mg via ORAL
  Filled 2016-03-31: qty 2

## 2016-03-31 MED ORDER — TETANUS-DIPHTH-ACELL PERTUSSIS 5-2.5-18.5 LF-MCG/0.5 IM SUSP
0.5000 mL | Freq: Once | INTRAMUSCULAR | Status: AC
  Administered 2016-03-31: 0.5 mL via INTRAMUSCULAR
  Filled 2016-03-31: qty 0.5

## 2016-03-31 NOTE — ED Triage Notes (Signed)
Per EMS pt from [facility] ([address]) with c/o fall. Per EMS pt fell when getting out of chair, fell face first, no LOC. Pt has laceration above left eye. Not on blood thinners

## 2016-03-31 NOTE — Discharge Instructions (Signed)
HOME CARE INSTRUCTIONS  Showers are allowed. Do not soak the area containing the tissue adhesive. Do not take baths, swim, or use hot tubs. Do not use any soaps or ointments on the wound. Certain ointments can weaken the glue. If a bandage (dressing) has been applied, follow your health care provider's instructions for how often to change the dressing.   Keep the dressing dry if one has been applied.   Do not scratch, pick, or rub the adhesive.   Do not place tape over the adhesive. The adhesive could come off when pulling the tape off.   Protect the wound from further injury until it is healed.   Protect the wound from sun and tanning bed exposure while it is healing and for several weeks after healing.   Only take over-the-counter or prescription medicines as directed by your health care provider.      Keep knee elevated at rest. Ice as needed. Take ibuprofen and tylenol for pain Return for worsening symptoms, including escalating pain, worsening swelling, fever, or any other symptoms concerning to you.

## 2016-03-31 NOTE — ED Provider Notes (Signed)
[facility]-EMERGENCY DEPT Provider Note   CSN: [id number] Arrival date & time: 03/31/16  1839     History   Chief Complaint Chief Complaint  Patient presents with  . Fall    HPI Ebony [name] is a 83 y.o. female.  HPI 83 year old female who presents after mechanical fall. She has a history of vascular dementia, traumatic subarachnoid and subdural hematoma, prior ischemic CVA. She does not take blood thinners. Sitting in chair at senior living facility, when Her phone was ringing. she states that she was in a rush to get to the phone. States that while getting up from chair, foot was stuck under chair. She fell forward onto bilateral knees and her face. Did sustain an abrasion to her face with bleeding. Complains of left knee pain after fall. She did not have syncope or loss of consciousness. Denies headache, nausea or vomiting, vision or speech changes, confusion, new numbness or weakness. Pain, abdominal pain, or dyspnea. Past Medical History:  Diagnosis Date  . Acute ischemic stroke (HCC) 04/11/2012  . Allergic rhinitis   . Anxiety   . Arthritis    Knees  . Depression   . Fibrocystic breast disease   . Hiatal hernia   . Hyperlipidemia   . Hypertension   . IFG (impaired fasting glucose)   . SDH (subdural hematoma) (HCC) 05/16/2011  . Subarachnoid hemorrhage following injury (HCC) 05/16/2011  . Subarachnoid hemorrhage following injury (HCC) 05/16/2011  . Unspecified hypothyroidism 04/15/2012    Patient Active Problem List   Diagnosis Date Noted  . Allergic rhinitis 06/08/2014  . DDD (degenerative disc disease), lumbar 06/08/2014  . Insomnia 12/07/2013  . Depression   . OA (osteoarthritis) 01/06/2013  . Loss of weight 07/13/2012  . Vascular dementia without behavioral disturbance 06/20/2012  . Hypothyroidism due to acquired atrophy of thyroid 04/15/2012  . Atrial fibrillation with rapid ventricular response (HCC) 04/12/2012  . HTN (hypertension) 05/16/2011  . Anxiety  disorder 05/16/2011    Past Surgical History:  Procedure Laterality Date  . ABDOMINAL HYSTERECTOMY    . BREAST SURGERY    . EYE SURGERY    . TONSILLECTOMY      OB History    No data available       Home Medications    Prior to Admission medications   Medication Sig Start Date End Date Taking? Authorizing Provider  acetaminophen (TYLENOL) 325 MG tablet Take 2 tablets (650 mg total) by mouth every 6 (six) hours as needed. 04/15/12   Christiane Ha, MD  ALPRAZolam Prudy Feeler) 0.25 MG tablet 1 by mouth every 8 hours as needed for anxiety control 12/12/14   Kimber Relic, MD  aspirin 81 MG chewable tablet Chew 81 mg by mouth daily.    Historical Provider, MD  cetirizine (ZYRTEC) 10 MG tablet Take 10 mg by mouth daily.    Historical Provider, MD  diltiazem (CARDIZEM CD) 120 MG 24 hr capsule Take 120 mg by mouth daily. for angina.    Historical Provider, MD  docusate sodium (COLACE) 100 MG capsule Take 100 mg by mouth daily as needed for mild constipation.    Historical Provider, MD  donepezil (ARICEPT) 10 MG tablet Take 10 mg by mouth daily.     Historical Provider, MD  DULoxetine (CYMBALTA) 60 MG capsule Take 60 mg by mouth daily.    Historical Provider, MD  fluticasone (FLONASE) 50 MCG/ACT nasal spray Place 2 sprays into both nostrils daily. 06/08/14   Sharon Seller, NP  HYDROcodone-acetaminophen (NORCO/VICODIN)  5-325 MG per tablet Take one tablet by mouth every 8 hours as needed for pain 04/13/14   Kirt Boys, DO  levothyroxine (SYNTHROID, LEVOTHROID) 25 MCG tablet Take 1 tablet (25 mcg total) by mouth daily before breakfast. 04/15/12   Christiane Ha, MD  Loperamide HCl (LOPERAMIDE A-D PO) Take 4 mg by mouth. Take after 1 st loose stool as needed    Historical Provider, MD  Melatonin 3 MG CAPS By mouth qhs for sleep 11/17/13   Sharon Seller, NP  memantine (NAMENDA) 10 MG tablet Take 10 mg by mouth 2 (two) times daily.    Historical Provider, MD  metoprolol (TOPROL-XL) 100  MG 24 hr tablet Take 100 mg by mouth every morning. for HTN.    Historical Provider, MD  mirtazapine (REMERON) 15 MG tablet Take 15 mg by mouth at bedtime.    Historical Provider, MD  Multiple Vitamin (MULITIVITAMIN WITH MINERALS) TABS Take 1 tablet by mouth every morning.    Historical Provider, MD  ondansetron (ZOFRAN) 4 MG tablet Take 4 mg by mouth every 6 (six) hours as needed for nausea or vomiting.    Historical Provider, MD  sodium chloride (OCEAN) 0.65 % SOLN nasal spray Place 2 sprays into both nostrils 4 (four) times daily as needed for congestion.    Historical Provider, MD    Family History Family History  Problem Relation Age of Onset  . Lung cancer Sister   . CVA Neg Hx   . Diabetes Neg Hx   . Hypertension Neg Hx     Social History Social History  Substance Use Topics  . Smoking status: Never Smoker  . Smokeless tobacco: Never Used  . Alcohol use No     Allergies   Penicillins; Ace inhibitors; Biaxin [clarithromycin]; Sulfa antibiotics; and Cozaar [losartan potassium]   Review of Systems Review of Systems 10/14 systems reviewed and are negative other than those stated in the HPI  Physical Exam Updated Vital Signs BP 186/85 (BP Location: Right Arm)   Pulse 64   Temp 97.6 F (36.4 C) (Oral)   Resp 18   Ht 5\' 4"  (1.626 m)   Wt 160 lb (72.6 kg)   SpO2 96%   BMI 27.46 kg/m   Physical Exam Physical Exam  Nursing note and vitals reviewed. Constitutional: Well developed, well nourished, non-toxic, and in no acute distress Head: Normocephalic and superficial abrasion to the left forehead. 4 cm superficial laceration above the left eyebrow.  Mouth/Throat: Oropharynx is clear and moist.  Neck: Normal range of motion. Neck supple.  no cervical spine tenderness Cardiovascular: Normal rate and regular rhythm.   Pulmonary/Chest: Effort normal and breath sounds normal.  no chest wall tenderness Abdominal: Soft. There is no tenderness. There is no rebound and no  guarding.  Musculoskeletal: Limited range of motion of the left knee due to swelling and bruising.  Neurological: Alert, no facial droop, fluent speech, moves all extremities symmetrically, sensation to light touch intact throughout, pupils equal and reactive to light Skin: Skin is warm and dry.  Psychiatric: Cooperative   ED Treatments / Results  Labs (all labs ordered are listed, but only abnormal results are displayed) Labs Reviewed - No data to display  EKG  EKG Interpretation None       Radiology Ct Head Wo Contrast  Result Date: 03/31/2016 CLINICAL DATA:  Larey Seat when getting out of a chair, face first. Laceration above the left eye. EXAM: CT HEAD WITHOUT CONTRAST CT CERVICAL SPINE WITHOUT CONTRAST  TECHNIQUE: Multidetector CT imaging of the head and cervical spine was performed following the standard protocol without intravenous contrast. Multiplanar CT image reconstructions of the cervical spine were also generated. COMPARISON:  04/13/2012 FINDINGS: CT HEAD FINDINGS Brain: There is no intracranial hemorrhage, mass or evidence of acute infarction. There is moderate generalized atrophy. There is moderate chronic microvascular ischemic change. There is no significant extra-axial fluid collection. The subdural hygroma/hematoma has resolved since 2014. No acute intracranial findings are evident. Vascular: No hyperdense vessel or unexpected calcification. Skull: Normal. Negative for fracture or focal lesion. Sinuses/Orbits: No acute finding. Other: None. CT CERVICAL SPINE FINDINGS Alignment: Degenerative anterolisthesis at C4-5, unchanged from 04/09/2012. No traumatic malalignment. Skull base and vertebrae: No acute fracture. No primary bone lesion or focal pathologic process. Soft tissues and spinal canal: No prevertebral fluid or swelling. No visible canal hematoma. Disc levels: Moderate degenerative cervical disc disease. Moderate cervical facet arthritis. No traumatic disruptions of the facet  articulations. Upper chest: Negative. Other: None IMPRESSION: 1. No acute intracranial findings. There is moderate generalized atrophy and chronic appearing white matter hypodensities which likely represent small vessel ischemic disease. 2. Negative for acute cervical spine fracture. Electronically Signed   By: Ellery Plunk M.D.   On: 03/31/2016 20:22   Ct Cervical Spine Wo Contrast  Result Date: 03/31/2016 CLINICAL DATA:  Larey Seat when getting out of a chair, face first. Laceration above the left eye. EXAM: CT HEAD WITHOUT CONTRAST CT CERVICAL SPINE WITHOUT CONTRAST TECHNIQUE: Multidetector CT imaging of the head and cervical spine was performed following the standard protocol without intravenous contrast. Multiplanar CT image reconstructions of the cervical spine were also generated. COMPARISON:  04/13/2012 FINDINGS: CT HEAD FINDINGS Brain: There is no intracranial hemorrhage, mass or evidence of acute infarction. There is moderate generalized atrophy. There is moderate chronic microvascular ischemic change. There is no significant extra-axial fluid collection. The subdural hygroma/hematoma has resolved since 2014. No acute intracranial findings are evident. Vascular: No hyperdense vessel or unexpected calcification. Skull: Normal. Negative for fracture or focal lesion. Sinuses/Orbits: No acute finding. Other: None. CT CERVICAL SPINE FINDINGS Alignment: Degenerative anterolisthesis at C4-5, unchanged from 04/09/2012. No traumatic malalignment. Skull base and vertebrae: No acute fracture. No primary bone lesion or focal pathologic process. Soft tissues and spinal canal: No prevertebral fluid or swelling. No visible canal hematoma. Disc levels: Moderate degenerative cervical disc disease. Moderate cervical facet arthritis. No traumatic disruptions of the facet articulations. Upper chest: Negative. Other: None IMPRESSION: 1. No acute intracranial findings. There is moderate generalized atrophy and chronic  appearing white matter hypodensities which likely represent small vessel ischemic disease. 2. Negative for acute cervical spine fracture. Electronically Signed   By: Ellery Plunk M.D.   On: 03/31/2016 20:22   Dg Knee Complete 4 Views Left  Result Date: 03/31/2016 CLINICAL DATA:  Left knee pain after fall at home. EXAM: LEFT KNEE - COMPLETE 4+ VIEW COMPARISON:  None. FINDINGS: No evidence of fracture, dislocation, or joint effusion. Severe narrowing of patellofemoral space is noted with spurring. Medial and lateral joint spaces appear normal. Large probable hematoma is seen in soft tissues anterior to patella. IMPRESSION: No fracture or dislocation is noted. Severe degenerative joint disease of patellofemoral joint is noted. Large hematoma seen in soft tissues anterior to patella. Electronically Signed   By: Lupita Raider, M.D.   On: 03/31/2016 20:03    Procedures Procedures (including critical care time) LACERATION REPAIR Performed by: Lavera Guise Authorized by: Lavera Guise Consent: Verbal consent  obtained. Risks and benefits: risks, benefits and alternatives were discussed Consent given by: patient Patient identity confirmed: provided demographic data Prepped and Draped in normal sterile fashion Wound explored  Laceration Location: above left eyebrow  Laceration Length: 4 cm  No Foreign Bodies seen or palpated  Anesthesia: n/a  Local anesthetic: n/a  Anesthetic total: n/a  Skin closure: skin adhesive  Number of sutures: n/a  Technique: skin adhesive  Patient tolerance: Patient tolerated the procedure well with no immediate complications.  Medications Ordered in ED Medications  Tdap (BOOSTRIX) injection 0.5 mL (0.5 mLs Intramuscular Given 03/31/16 2024)  ibuprofen (ADVIL,MOTRIN) tablet 400 mg (400 mg Oral Given 03/31/16 2024)     Initial Impression / Assessment and Plan / ED Course  I have reviewed the triage vital signs and the nursing notes.  Pertinent labs &  imaging results that were available during my care of the patient were reviewed by me and considered in my medical decision making (see chart for details).     83 year old female who presents after mechanical fall. Nontoxic in no acute distress at baseline mental status and normal neurological exam. With small superficial laceration above the left eyebrow. Also with bruising and soft tissue swelling involving the left knee. No other traumatic injuries noted. CT head and cervical spine obtained, visualized, and shows no acute intracranial cervical spine processes. X-ray of the left knee without fracture but with arthritis and evidence of soft tissue swelling and hematoma of the skin. Laceration repaired with skin adhesive. Discussed supportive care management regarding left knee injury. Appropriate for discharge back to facility. Strict return and follow-up instructions reviewed. She expressed understanding of all discharge instructions and felt comfortable with the plan of care.    Final Clinical Impressions(s) / ED Diagnoses   Final diagnoses:  Facial laceration, initial encounter  Acute pain of left knee  Traumatic hematoma of left knee, initial encounter    New Prescriptions New Prescriptions   No medications on file     Lavera Guiseana Duo Rosemae Mcquown, MD 04/01/16 [phone]

## 2016-04-01 ENCOUNTER — Telehealth: Payer: Self-pay

## 2016-04-01 NOTE — Telephone Encounter (Signed)
Pt no longer at [facility]

## 2016-04-01 NOTE — Telephone Encounter (Signed)
Possible re-admission to facility. This is a patient [facility] were seeing at [facility] if patient was re-admitted to facility upon discharge. [facility].

## 2016-04-02 DIAGNOSIS — M6281 Muscle weakness (generalized): Secondary | ICD-10-CM | POA: Diagnosis not present

## 2016-04-02 DIAGNOSIS — R296 Repeated falls: Secondary | ICD-10-CM | POA: Diagnosis not present

## 2016-04-07 DIAGNOSIS — J309 Allergic rhinitis, unspecified: Secondary | ICD-10-CM | POA: Diagnosis not present

## 2016-04-07 DIAGNOSIS — W19XXXA Unspecified fall, initial encounter: Secondary | ICD-10-CM | POA: Diagnosis not present

## 2016-04-07 DIAGNOSIS — R300 Dysuria: Secondary | ICD-10-CM | POA: Diagnosis not present

## 2016-04-07 DIAGNOSIS — K5900 Constipation, unspecified: Secondary | ICD-10-CM | POA: Diagnosis not present

## 2016-04-07 DIAGNOSIS — K599 Functional intestinal disorder, unspecified: Secondary | ICD-10-CM | POA: Diagnosis not present

## 2016-04-07 DIAGNOSIS — G894 Chronic pain syndrome: Secondary | ICD-10-CM | POA: Diagnosis not present

## 2016-04-07 DIAGNOSIS — S8992XA Unspecified injury of left lower leg, initial encounter: Secondary | ICD-10-CM | POA: Diagnosis not present

## 2016-04-07 DIAGNOSIS — S0181XA Laceration without foreign body of other part of head, initial encounter: Secondary | ICD-10-CM | POA: Diagnosis not present

## 2016-05-05 DIAGNOSIS — R197 Diarrhea, unspecified: Secondary | ICD-10-CM | POA: Diagnosis not present

## 2016-05-05 DIAGNOSIS — J309 Allergic rhinitis, unspecified: Secondary | ICD-10-CM | POA: Diagnosis not present

## 2016-05-05 DIAGNOSIS — R69 Illness, unspecified: Secondary | ICD-10-CM | POA: Diagnosis not present

## 2016-05-05 DIAGNOSIS — G3184 Mild cognitive impairment, so stated: Secondary | ICD-10-CM | POA: Diagnosis not present

## 2016-05-05 DIAGNOSIS — Z5181 Encounter for therapeutic drug level monitoring: Secondary | ICD-10-CM | POA: Diagnosis not present

## 2016-05-05 DIAGNOSIS — R300 Dysuria: Secondary | ICD-10-CM | POA: Diagnosis not present

## 2016-05-06 DIAGNOSIS — M79645 Pain in left finger(s): Secondary | ICD-10-CM | POA: Diagnosis not present

## 2016-05-07 DIAGNOSIS — M79645 Pain in left finger(s): Secondary | ICD-10-CM | POA: Diagnosis not present

## 2016-05-07 DIAGNOSIS — S62525A Nondisplaced fracture of distal phalanx of left thumb, initial encounter for closed fracture: Secondary | ICD-10-CM | POA: Diagnosis not present

## 2016-05-12 DIAGNOSIS — R269 Unspecified abnormalities of gait and mobility: Secondary | ICD-10-CM | POA: Diagnosis not present

## 2016-05-12 DIAGNOSIS — G4700 Insomnia, unspecified: Secondary | ICD-10-CM | POA: Diagnosis not present

## 2016-05-12 DIAGNOSIS — I10 Essential (primary) hypertension: Secondary | ICD-10-CM | POA: Diagnosis not present

## 2016-05-12 DIAGNOSIS — R197 Diarrhea, unspecified: Secondary | ICD-10-CM | POA: Diagnosis not present

## 2016-05-12 DIAGNOSIS — K5900 Constipation, unspecified: Secondary | ICD-10-CM | POA: Diagnosis not present

## 2016-05-12 DIAGNOSIS — S62502A Fracture of unspecified phalanx of left thumb, initial encounter for closed fracture: Secondary | ICD-10-CM | POA: Diagnosis not present

## 2016-05-12 DIAGNOSIS — K580 Irritable bowel syndrome with diarrhea: Secondary | ICD-10-CM | POA: Diagnosis not present

## 2016-05-12 DIAGNOSIS — R69 Illness, unspecified: Secondary | ICD-10-CM | POA: Diagnosis not present

## 2016-05-13 DIAGNOSIS — B351 Tinea unguium: Secondary | ICD-10-CM | POA: Diagnosis not present

## 2016-05-21 DIAGNOSIS — S62525D Nondisplaced fracture of distal phalanx of left thumb, subsequent encounter for fracture with routine healing: Secondary | ICD-10-CM | POA: Diagnosis not present

## 2016-06-02 DIAGNOSIS — K5900 Constipation, unspecified: Secondary | ICD-10-CM | POA: Diagnosis not present

## 2016-06-02 DIAGNOSIS — I10 Essential (primary) hypertension: Secondary | ICD-10-CM | POA: Diagnosis not present

## 2016-06-02 DIAGNOSIS — R69 Illness, unspecified: Secondary | ICD-10-CM | POA: Diagnosis not present

## 2016-06-02 DIAGNOSIS — K580 Irritable bowel syndrome with diarrhea: Secondary | ICD-10-CM | POA: Diagnosis not present

## 2016-06-02 DIAGNOSIS — G4700 Insomnia, unspecified: Secondary | ICD-10-CM | POA: Diagnosis not present

## 2016-06-04 DIAGNOSIS — M79645 Pain in left finger(s): Secondary | ICD-10-CM | POA: Diagnosis not present

## 2016-06-16 DIAGNOSIS — E039 Hypothyroidism, unspecified: Secondary | ICD-10-CM | POA: Diagnosis not present

## 2016-06-16 DIAGNOSIS — I10 Essential (primary) hypertension: Secondary | ICD-10-CM | POA: Diagnosis not present

## 2016-06-16 DIAGNOSIS — R69 Illness, unspecified: Secondary | ICD-10-CM | POA: Diagnosis not present

## 2016-06-16 DIAGNOSIS — K589 Irritable bowel syndrome without diarrhea: Secondary | ICD-10-CM | POA: Diagnosis not present

## 2016-06-16 DIAGNOSIS — G894 Chronic pain syndrome: Secondary | ICD-10-CM | POA: Diagnosis not present

## 2016-06-16 DIAGNOSIS — R269 Unspecified abnormalities of gait and mobility: Secondary | ICD-10-CM | POA: Diagnosis not present

## 2016-06-24 ENCOUNTER — Inpatient Hospital Stay (HOSPITAL_COMMUNITY)
Admission: EM | Admit: 2016-06-24 | Discharge: 2016-06-26 | DRG: 092 | Disposition: A | Discharge status: Home / Self Care | Payer: MEDICARE | Attending: Nephrology | Admitting: Nephrology

## 2016-06-24 ENCOUNTER — Inpatient Hospital Stay (HOSPITAL_COMMUNITY): Payer: MEDICARE

## 2016-06-24 ENCOUNTER — Emergency Department (HOSPITAL_COMMUNITY): Payer: MEDICARE

## 2016-06-24 ENCOUNTER — Other Ambulatory Visit (HOSPITAL_COMMUNITY): Payer: Self-pay

## 2016-06-24 ENCOUNTER — Other Ambulatory Visit: Payer: Self-pay

## 2016-06-24 ENCOUNTER — Encounter (HOSPITAL_COMMUNITY): Payer: Self-pay | Admitting: Emergency Medicine

## 2016-06-24 DIAGNOSIS — Z87820 Personal history of traumatic brain injury: Secondary | ICD-10-CM

## 2016-06-24 DIAGNOSIS — K219 Gastro-esophageal reflux disease without esophagitis: Secondary | ICD-10-CM

## 2016-06-24 DIAGNOSIS — Z7982 Long term (current) use of aspirin: Secondary | ICD-10-CM | POA: Diagnosis not present

## 2016-06-24 DIAGNOSIS — F05 Delirium due to known physiological condition: Secondary | ICD-10-CM | POA: Diagnosis present

## 2016-06-24 DIAGNOSIS — E785 Hyperlipidemia, unspecified: Secondary | ICD-10-CM | POA: Diagnosis not present

## 2016-06-24 DIAGNOSIS — I6789 Other cerebrovascular disease: Secondary | ICD-10-CM | POA: Diagnosis not present

## 2016-06-24 DIAGNOSIS — R69 Illness, unspecified: Secondary | ICD-10-CM | POA: Diagnosis not present

## 2016-06-24 DIAGNOSIS — R404 Transient alteration of awareness: Secondary | ICD-10-CM | POA: Diagnosis not present

## 2016-06-24 DIAGNOSIS — G459 Transient cerebral ischemic attack, unspecified: Secondary | ICD-10-CM | POA: Diagnosis not present

## 2016-06-24 DIAGNOSIS — Z801 Family history of malignant neoplasm of trachea, bronchus and lung: Secondary | ICD-10-CM | POA: Diagnosis not present

## 2016-06-24 DIAGNOSIS — Z8673 Personal history of transient ischemic attack (TIA), and cerebral infarction without residual deficits: Secondary | ICD-10-CM | POA: Diagnosis present

## 2016-06-24 DIAGNOSIS — I10 Essential (primary) hypertension: Secondary | ICD-10-CM | POA: Diagnosis present

## 2016-06-24 DIAGNOSIS — F418 Other specified anxiety disorders: Secondary | ICD-10-CM | POA: Diagnosis present

## 2016-06-24 DIAGNOSIS — E039 Hypothyroidism, unspecified: Secondary | ICD-10-CM | POA: Diagnosis not present

## 2016-06-24 DIAGNOSIS — Z79899 Other long term (current) drug therapy: Secondary | ICD-10-CM

## 2016-06-24 DIAGNOSIS — I639 Cerebral infarction, unspecified: Secondary | ICD-10-CM

## 2016-06-24 DIAGNOSIS — I517 Cardiomegaly: Secondary | ICD-10-CM | POA: Diagnosis not present

## 2016-06-24 DIAGNOSIS — Z66 Do not resuscitate: Secondary | ICD-10-CM | POA: Diagnosis present

## 2016-06-24 DIAGNOSIS — R4701 Aphasia: Principal | ICD-10-CM | POA: Diagnosis present

## 2016-06-24 DIAGNOSIS — R410 Disorientation, unspecified: Secondary | ICD-10-CM | POA: Diagnosis not present

## 2016-06-24 LAB — CBG MONITORING, ED
Glucose-Capillary: 80 mg/dL (ref 65–99)
Glucose-Capillary: 83 mg/dL (ref 65–99)
Glucose-Capillary: 72 mg/dL (ref 65–99)
Glucose-Capillary: 83 mg/dL (ref 65–99)

## 2016-06-24 LAB — COMPREHENSIVE METABOLIC PANEL
ALBUMIN: 3.9 g/dL (ref 3.5–5.0)
ALT: 18 U/L (ref 14–54)
AST: 27 U/L (ref 15–41)
Alkaline Phosphatase: 82 U/L (ref 38–126)
Anion gap: 11 (ref 5–15)
BILIRUBIN TOTAL: 0.5 mg/dL (ref 0.3–1.2)
BUN: 14 mg/dL (ref 6–20)
CHLORIDE: 100 mmol/L — AB (ref 101–111)
CO2: 25 mmol/L (ref 22–32)
Calcium: 9.3 mg/dL (ref 8.9–10.3)
Creatinine, Ser: 0.94 mg/dL (ref 0.44–1.00)
GFR calc Af Amer: >60 mL/min (ref >60)
GFR, EST NON AFRICAN AMERICAN: 55 mL/min — AB (ref >60)
GLUCOSE: 85 mg/dL (ref 65–99)
POTASSIUM: 3.9 mmol/L (ref 3.5–5.1)
Sodium: 136 mmol/L (ref 135–145)
TOTAL PROTEIN: 7 g/dL (ref 6.5–8.1)

## 2016-06-24 LAB — DIFFERENTIAL
BASOS ABS: 0 10*3/uL (ref 0.0–0.1)
BASOS PCT: 0 %
EOS ABS: 0.1 10*3/uL (ref 0.0–0.7)
EOS PCT: 1 %
LYMPHS ABS: 3.1 10*3/uL (ref 0.7–4.0)
Lymphocytes Relative: 31 %
Monocytes Absolute: 0.6 10*3/uL (ref 0.1–1.0)
Monocytes Relative: 6 %
NEUTROS PCT: 62 %
Neutro Abs: 6.1 10*3/uL (ref 1.7–7.7)

## 2016-06-24 LAB — CBC
HCT: 37.6 % (ref 36.0–46.0)
Hemoglobin: 11.9 g/dL — ABNORMAL LOW (ref 12.0–15.0)
MCH: 29 pg (ref 26.0–34.0)
MCHC: 31.6 g/dL (ref 30.0–36.0)
MCV: 91.5 fL (ref 78.0–100.0)
Platelets: 219 10*3/uL (ref 150–400)
RBC: 4.11 MIL/uL (ref 3.87–5.11)
RDW: 14.5 % (ref 11.5–15.5)
WBC: 9.8 10*3/uL (ref 4.0–10.5)

## 2016-06-24 LAB — I-STAT CHEM 8, ED
BUN: 20 mg/dL (ref 6–20)
CALCIUM ION: 1.03 mmol/L — AB (ref 1.15–1.40)
Chloride: 100 mmol/L — ABNORMAL LOW (ref 101–111)
Creatinine, Ser: 0.9 mg/dL (ref 0.44–1.00)
Glucose, Bld: 95 mg/dL (ref 65–99)
HCT: 39 % (ref 36.0–46.0)
Hemoglobin: 13.3 g/dL (ref 12.0–15.0)
Potassium: 4.3 mmol/L (ref 3.5–5.1)
SODIUM: 136 mmol/L (ref 135–145)
TCO2: 28 mmol/L (ref 0–100)

## 2016-06-24 LAB — URINALYSIS, ROUTINE W REFLEX MICROSCOPIC
Bilirubin Urine: NEGATIVE
GLUCOSE, UA: NEGATIVE mg/dL
Hgb urine dipstick: NEGATIVE
Ketones, ur: NEGATIVE mg/dL
Leukocytes, UA: NEGATIVE
Nitrite: NEGATIVE
PH: 6 (ref 5.0–8.0)
PROTEIN: NEGATIVE mg/dL
SPECIFIC GRAVITY, URINE: 1.024 (ref 1.005–1.030)

## 2016-06-24 LAB — GLUCOSE, CAPILLARY
GLUCOSE-CAPILLARY: 103 mg/dL — AB (ref 65–99)
GLUCOSE-CAPILLARY: 83 mg/dL (ref 65–99)
Glucose-Capillary: 89 mg/dL (ref 65–99)

## 2016-06-24 LAB — I-STAT TROPONIN, ED: TROPONIN I, POC: 0.02 ng/mL (ref 0.00–0.08)

## 2016-06-24 LAB — APTT: APTT: 25 s (ref 24–36)

## 2016-06-24 LAB — PROTIME-INR
INR: 0.99
Prothrombin Time: 13.1 seconds (ref 11.4–15.2)

## 2016-06-24 MED ORDER — PANTOPRAZOLE SODIUM 20 MG PO TBEC
20.0000 mg | DELAYED_RELEASE_TABLET | Freq: Every day | ORAL | Status: DC
  Administered 2016-06-25 – 2016-06-26 (×2): 20 mg via ORAL
  Filled 2016-06-24 (×3): qty 1

## 2016-06-24 MED ORDER — HYDROCODONE-ACETAMINOPHEN 5-325 MG PO TABS: 1.0000 | ORAL_TABLET | Freq: Four times a day (QID) | ORAL | Status: DC | PRN

## 2016-06-24 MED ORDER — STROKE: EARLY STAGES OF RECOVERY BOOK
Freq: Once | Status: AC
  Administered 2016-06-24: 17:00:00
  Filled 2016-06-24: qty 1

## 2016-06-24 MED ORDER — PSYLLIUM 95 % PO PACK
1.0000 | PACK | Freq: Every day | ORAL | Status: DC
  Administered 2016-06-25: 1 via ORAL
  Filled 2016-06-24 (×3): qty 1

## 2016-06-24 MED ORDER — DEXTROSE 50 % IV SOLN
25.0000 mL | Freq: Once | INTRAVENOUS | Status: AC
  Administered 2016-06-24: 25 mL via INTRAVENOUS
  Filled 2016-06-24: qty 50

## 2016-06-24 MED ORDER — ASPIRIN 81 MG PO CHEW: 81.0000 mg | CHEWABLE_TABLET | Freq: Every day | ORAL | Status: DC

## 2016-06-24 MED ORDER — DULOXETINE HCL 20 MG PO CPEP
40.0000 mg | ORAL_CAPSULE | Freq: Every day | ORAL | Status: DC
  Administered 2016-06-25 – 2016-06-26 (×2): 40 mg via ORAL
  Filled 2016-06-24 (×2): qty 2

## 2016-06-24 MED ORDER — LEVOTHYROXINE SODIUM 25 MCG PO TABS
25.0000 ug | ORAL_TABLET | Freq: Every day | ORAL | Status: DC
  Administered 2016-06-25: 25 ug via ORAL
  Filled 2016-06-24: qty 1

## 2016-06-24 MED ORDER — HYDRALAZINE HCL 20 MG/ML IJ SOLN: 5.0000 mg | INTRAMUSCULAR | Status: DC | PRN

## 2016-06-24 MED ORDER — IOPAMIDOL (ISOVUE-370) INJECTION 76%
INTRAVENOUS | Status: AC
  Filled 2016-06-24: qty 50

## 2016-06-24 MED ORDER — ONDANSETRON HCL 4 MG PO TABS: 4.0000 mg | ORAL_TABLET | Freq: Four times a day (QID) | ORAL | Status: DC | PRN

## 2016-06-24 MED ORDER — ASPIRIN 325 MG PO TABS
325.0000 mg | ORAL_TABLET | Freq: Every day | ORAL | Status: DC
  Administered 2016-06-25 – 2016-06-26 (×2): 325 mg via ORAL
  Filled 2016-06-24 (×2): qty 1

## 2016-06-24 MED ORDER — ASPIRIN 300 MG RE SUPP
300.0000 mg | Freq: Every day | RECTAL | Status: DC
  Administered 2016-06-24: 300 mg via RECTAL
  Filled 2016-06-24: qty 1

## 2016-06-24 MED ORDER — HEPARIN SODIUM (PORCINE) 5000 UNIT/ML IJ SOLN
5000.0000 [IU] | Freq: Three times a day (TID) | INTRAMUSCULAR | Status: DC
  Administered 2016-06-24 – 2016-06-26 (×6): 5000 [IU] via SUBCUTANEOUS
  Filled 2016-06-24 (×6): qty 1

## 2016-06-24 MED ORDER — ALPRAZOLAM 0.25 MG PO TABS: 0.2500 mg | ORAL_TABLET | Freq: Two times a day (BID) | ORAL | Status: DC | PRN

## 2016-06-24 MED ORDER — ONDANSETRON HCL 4 MG/2ML IJ SOLN: 4.0000 mg | Freq: Four times a day (QID) | INTRAMUSCULAR | Status: DC | PRN

## 2016-06-24 MED ORDER — IOPAMIDOL (ISOVUE-370) INJECTION 76%
50.0000 mL | Freq: Once | INTRAVENOUS | Status: AC | PRN
  Administered 2016-06-24: 50 mL via INTRAVENOUS

## 2016-06-24 MED ORDER — SODIUM CHLORIDE 0.45 % IV SOLN
INTRAVENOUS | Status: DC
  Administered 2016-06-24: 17:00:00 via INTRAVENOUS

## 2016-06-24 NOTE — ED Provider Notes (Signed)
[facility]-EMERGENCY DEPT Provider Note   CSN: [id number] Arrival date & time: 06/24/16  1227   An emergency department physician performed an initial assessment on this suspected stroke patient at 1228 (Ebony [name]).  History   Chief Complaint Chief Complaint  Patient presents with  . Code Stroke    HPI Ebony [name] is a 83 y.o. female.  83yo F w/ PMH including SDH, HTN, HLD who p/w aphasia. Per EMS, pt was at breakfast this morning around 10:30 when staff noted difficulty talking and generalized weakness. EMS reports that her speech has been intermittently altered during transport. She reportedly is independent w/ ADLs normally, ambulating with a walker.  LEVEL 5 CAVEAT DUE TO APHASIA   The history is provided by the EMS personnel.    Past Medical History:  Diagnosis Date  . Acute ischemic stroke (HCC) 04/11/2012  . Allergic rhinitis   . Anxiety   . Arthritis    Knees  . Depression   . Fibrocystic breast disease   . Hiatal hernia   . Hyperlipidemia   . Hypertension   . IFG (impaired fasting glucose)   . SDH (subdural hematoma) (HCC) 05/16/2011  . Subarachnoid hemorrhage following injury (HCC) 05/16/2011  . Subarachnoid hemorrhage following injury (HCC) 05/16/2011  . Unspecified hypothyroidism 04/15/2012    Patient Active Problem List   Diagnosis Date Noted  . Allergic rhinitis 06/08/2014  . DDD (degenerative disc disease), lumbar 06/08/2014  . Insomnia 12/07/2013  . Depression   . OA (osteoarthritis) 01/06/2013  . Loss of weight 07/13/2012  . Vascular dementia without behavioral disturbance 06/20/2012  . Hypothyroidism due to acquired atrophy of thyroid 04/15/2012  . Atrial fibrillation with rapid ventricular response (HCC) 04/12/2012  . HTN (hypertension) 05/16/2011  . Anxiety disorder 05/16/2011    Past Surgical History:  Procedure Laterality Date  . ABDOMINAL HYSTERECTOMY    . BREAST SURGERY    . EYE SURGERY    . TONSILLECTOMY      OB History    No data  available       Home Medications    Prior to Admission medications   Medication Sig Start Date End Date Taking? Authorizing Provider  acetaminophen (TYLENOL) 325 MG tablet Take 2 tablets (650 mg total) by mouth every 6 (six) hours as needed. 04/15/12   Christiane Ha, MD  ALPRAZolam Prudy Feeler) 0.25 MG tablet 1 by mouth every 8 hours as needed for anxiety control 12/12/14   Kimber Relic, MD  aspirin 81 MG chewable tablet Chew 81 mg by mouth daily.    Historical Provider, MD  cetirizine (ZYRTEC) 10 MG tablet Take 10 mg by mouth daily.    Historical Provider, MD  diltiazem (CARDIZEM CD) 120 MG 24 hr capsule Take 120 mg by mouth daily. for angina.    Historical Provider, MD  docusate sodium (COLACE) 100 MG capsule Take 100 mg by mouth daily as needed for mild constipation.    Historical Provider, MD  donepezil (ARICEPT) 10 MG tablet Take 10 mg by mouth daily.     Historical Provider, MD  DULoxetine (CYMBALTA) 60 MG capsule Take 60 mg by mouth daily.    Historical Provider, MD  fluticasone (FLONASE) 50 MCG/ACT nasal spray Place 2 sprays into both nostrils daily. 06/08/14   Sharon Seller, NP  HYDROcodone-acetaminophen (NORCO/VICODIN) 5-325 MG per tablet Take one tablet by mouth every 8 hours as needed for pain 04/13/14   Kirt Boys, DO  levothyroxine (SYNTHROID, LEVOTHROID) 25 MCG tablet Take 1 tablet (  25 mcg total) by mouth daily before breakfast. 04/15/12   Christiane Ha, MD  Loperamide HCl (LOPERAMIDE A-D PO) Take 4 mg by mouth. Take after 1 st loose stool as needed    Historical Provider, MD  Melatonin 3 MG CAPS By mouth qhs for sleep 11/17/13   Sharon Seller, NP  memantine (NAMENDA) 10 MG tablet Take 10 mg by mouth 2 (two) times daily.    Historical Provider, MD  metoprolol (TOPROL-XL) 100 MG 24 hr tablet Take 100 mg by mouth every morning. for HTN.    Historical Provider, MD  mirtazapine (REMERON) 15 MG tablet Take 15 mg by mouth at bedtime.    Historical Provider, MD  Multiple  Vitamin (MULITIVITAMIN WITH MINERALS) TABS Take 1 tablet by mouth every morning.    Historical Provider, MD  ondansetron (ZOFRAN) 4 MG tablet Take 4 mg by mouth every 6 (six) hours as needed for nausea or vomiting.    Historical Provider, MD  sodium chloride (OCEAN) 0.65 % SOLN nasal spray Place 2 sprays into both nostrils 4 (four) times daily as needed for congestion.    Historical Provider, MD    Family History Family History  Problem Relation Age of Onset  . Lung cancer Sister   . CVA Neg Hx   . Diabetes Neg Hx   . Hypertension Neg Hx     Social History Social History  Substance Use Topics  . Smoking status: Never Smoker  . Smokeless tobacco: Never Used  . Alcohol use No     Allergies   Penicillins; Ace inhibitors; Biaxin [clarithromycin]; Sulfa antibiotics; and Cozaar [losartan potassium]   Review of Systems Review of Systems Unable to obtain ROS due to aphasia  Physical Exam Updated Vital Signs Ht  (1.6 m)   Wt 161 lb 6 oz (73.2 kg)   SpO2 94%   BMI 28.59 kg/m   Physical Exam  Constitutional: She appears well-developed and well-nourished. No distress.  Awake, alert  HENT:  Head: Normocephalic and atraumatic.  Eyes: Conjunctivae and EOM are normal. Pupils are equal, round, and reactive to light.  Neck: Neck supple.  Cardiovascular: Normal rate, regular rhythm and normal heart sounds.   No murmur heard. Pulmonary/Chest: Effort normal and breath sounds normal. No respiratory distress.  Abdominal: Soft. Bowel sounds are normal. She exhibits no distension. There is no tenderness.  Musculoskeletal: She exhibits no edema.  Neurological: She is alert. She has normal reflexes. No cranial nerve deficit. She exhibits normal muscle tone.  Oriented to person and place,Expressive aphasia with some word substitution, normal finger-to-nose testing, negative pronator drift, no clonus 5/5 strength and normal sensation x all 4 extremities  Skin: Skin is warm and dry.    Nursing note and vitals reviewed.    ED Treatments / Results  Labs (all labs ordered are listed, but only abnormal results are displayed) Labs Reviewed  CBC - Abnormal; Notable for the following:       Result Value   Hemoglobin 11.9 (*)    All other components within normal limits  COMPREHENSIVE METABOLIC PANEL - Abnormal; Notable for the following:    Chloride 100 (*)    GFR calc non Af Amer 55 (*)    All other components within normal limits  I-STAT CHEM 8, ED - Abnormal; Notable for the following:    Chloride 100 (*)    Calcium, Ion 1.03 (*)    All other components within normal limits  PROTIME-INR  APTT  DIFFERENTIAL  Rosezena Sensor, ED  CBG MONITORING, ED  CBG MONITORING, ED    EKG  EKG Interpretation None       Radiology Ct Angio Head W Or Wo Contrast  Result Date: 06/24/2016 CLINICAL DATA:  Acute presentation with aphasia.  Code stroke. EXAM: CT ANGIOGRAPHY HEAD AND NECK TECHNIQUE: Multidetector CT imaging of the head and neck was performed using the standard protocol during bolus administration of intravenous contrast. Multiplanar CT image reconstructions and MIPs were obtained to evaluate the vascular anatomy. Carotid stenosis measurements (when applicable) are obtained utilizing NASCET criteria, using the distal internal carotid diameter as the denominator. CONTRAST:  50 cc Isovue 370 COMPARISON:  Head CT earlier same day. FINDINGS: CTA NECK FINDINGS Aortic arch: Aortic atherosclerosis. No aneurysm or dissection. Branching pattern of the brachiocephalic vessels from the arch is normal without origin stenosis. Right carotid system: Common carotid artery widely patent to the bifurcation. Carotid bifurcation normal without atherosclerotic disease, stenosis or irregularity. Cervical internal carotid artery widely patent. Left carotid system: Common carotid artery widely patent to the bifurcation. Carotid bifurcation normal without atherosclerotic change, narrowing or  irregularity. Cervical ICA is tortuous but normal. Vertebral arteries: Both vertebral arteries are patent with the left being dominant. No vertebral artery origin stenosis. No stenotic disease seen along the course of either artery. Skeleton: Ordinary cervical spondylosis. Other neck: No mass or lymphadenopathy. Upper chest: Mild apical scarring. Review of the MIP images confirms the above findings CTA HEAD FINDINGS Anterior circulation: Both internal carotid arteries are widely patent through the skullbase. There is mild atherosclerotic calcification in the siphon regions but no stenosis. Anterior and middle cerebral vessels are patent without stenosis, aneurysm or vascular malformation. No emboli seen. No detected missing branches. Posterior circulation: Both vertebral arteries are widely patent to the basilar with the left being dominant. No basilar stenosis. Posterior circulation branch vessels are normal. Venous sinuses: Patent and normal Anatomic variants: None significant Delayed phase: No abnormal enhancement Review of the MIP images confirms the above findings IMPRESSION: Negative CT angiography of the neck and head. No carotid bifurcation disease. No vertebral origin disease. No intracranial stenosis or occlusion. These results were called by telephone at the time of interpretation on 06/24/2016 at 12:55 PM to Dr. Ritta Slot , who verbally acknowledged these results. Electronically Signed   By: Paulina Fusi M.D.   On: 06/24/2016 13:02   Ct Angio Neck W Or Wo Contrast  Result Date: 06/24/2016 CLINICAL DATA:  Acute presentation with aphasia.  Code stroke. EXAM: CT ANGIOGRAPHY HEAD AND NECK TECHNIQUE: Multidetector CT imaging of the head and neck was performed using the standard protocol during bolus administration of intravenous contrast. Multiplanar CT image reconstructions and MIPs were obtained to evaluate the vascular anatomy. Carotid stenosis measurements (when applicable) are obtained  utilizing NASCET criteria, using the distal internal carotid diameter as the denominator. CONTRAST:  50 cc Isovue 370 COMPARISON:  Head CT earlier same day. FINDINGS: CTA NECK FINDINGS Aortic arch: Aortic atherosclerosis. No aneurysm or dissection. Branching pattern of the brachiocephalic vessels from the arch is normal without origin stenosis. Right carotid system: Common carotid artery widely patent to the bifurcation. Carotid bifurcation normal without atherosclerotic disease, stenosis or irregularity. Cervical internal carotid artery widely patent. Left carotid system: Common carotid artery widely patent to the bifurcation. Carotid bifurcation normal without atherosclerotic change, narrowing or irregularity. Cervical ICA is tortuous but normal. Vertebral arteries: Both vertebral arteries are patent with the left being dominant. No vertebral artery origin stenosis. No stenotic disease  seen along the course of either artery. Skeleton: Ordinary cervical spondylosis. Other neck: No mass or lymphadenopathy. Upper chest: Mild apical scarring. Review of the MIP images confirms the above findings CTA HEAD FINDINGS Anterior circulation: Both internal carotid arteries are widely patent through the skullbase. There is mild atherosclerotic calcification in the siphon regions but no stenosis. Anterior and middle cerebral vessels are patent without stenosis, aneurysm or vascular malformation. No emboli seen. No detected missing branches. Posterior circulation: Both vertebral arteries are widely patent to the basilar with the left being dominant. No basilar stenosis. Posterior circulation branch vessels are normal. Venous sinuses: Patent and normal Anatomic variants: None significant Delayed phase: No abnormal enhancement Review of the MIP images confirms the above findings IMPRESSION: Negative CT angiography of the neck and head. No carotid bifurcation disease. No vertebral origin disease. No intracranial stenosis or  occlusion. These results were called by telephone at the time of interpretation on 06/24/2016 at 12:55 PM to Dr. Ritta Slot , who verbally acknowledged these results. Electronically Signed   By: Paulina Fusi M.D.   On: 06/24/2016 13:02   Ct Head Code Stroke W/o Cm  Result Date: 06/24/2016 CLINICAL DATA:  Code stroke.  Aphasia. EXAM: CT HEAD WITHOUT CONTRAST TECHNIQUE: Contiguous axial images were obtained from the base of the skull through the vertex without intravenous contrast. COMPARISON:  03/31/2016 FINDINGS: Brain: Generalized atrophy. Extensive chronic small-vessel ischemic changes of the hemispheric white matter. No sign of acute infarction, mass lesion, hemorrhage, hydrocephalus or extra-axial collection. Vascular: There is atherosclerotic calcification of the major vessels at the base of the brain. Skull: Negative Sinuses/Orbits: Clear/normal Other: None significant ASPECTS (Alberta Stroke Program Early CT Score) - Ganglionic level infarction (caudate, lentiform nuclei, internal capsule, insula, M1-M3 cortex): 7 - Supraganglionic infarction (M4-M6 cortex): 3 Total score (0-10 with 10 being normal): 10 IMPRESSION: 1. No acute finding. Atrophy and chronic small-vessel ischemic changes. 2. ASPECTS is 10 These results were called by telephone at the time of interpretation on 06/24/2016 at 12:41 pm to Dr. Amada Jupiter, who verbally acknowledged these results. Electronically Signed   By: Paulina Fusi M.D.   On: 06/24/2016 12:43    Procedures .Critical Care Performed by: Laurence Spates Authorized by: Laurence Spates   Critical care provider statement:    Critical care time (minutes):  30   Critical care time was exclusive of:  Separately billable procedures and treating other patients   Critical care was necessary to treat or prevent imminent or life-threatening deterioration of the following conditions:  CNS failure or compromise   Critical care was time spent personally by me  on the following activities:  Development of treatment plan with patient or surrogate, discussions with consultants, examination of patient, obtaining history from patient or surrogate, ordering and performing treatments and interventions, ordering and review of laboratory studies, ordering and review of radiographic studies, re-evaluation of patient's condition and review of old charts   (including critical care time)  Medications Ordered in ED Medications  iopamidol (ISOVUE-370) 76 % injection (not administered)  iopamidol (ISOVUE-370) 76 % injection 50 mL (50 mLs Intravenous Contrast Given 06/24/16 1238)     Initial Impression / Assessment and Plan / ED Course  I have reviewed the triage vital signs and the nursing notes.  Pertinent labs & imaging results that were available during my care of the patient were reviewed by me and considered in my medical decision making (see chart for details).     PT brought in by  EMS For difficulty speaking beginning this morning. On exam, she had expressive aphasia and would occasionally substitute erroneous words. She was afebrile with stable vital signs. Code stroke was called in route and patient immediately taken to CT scanner where CT of head and later CTA head and neck were negative for acute process. Dr. Amada Jupiter, neurologist, evaluated the patient and felt that she was likely having a stroke but we were unable to confirm the time of onset of her symptoms and it is possible that she had them upon waking this morning, therefore she was not a TPA candidate. He has recommended admission for stroke workup due to his significant concern that she has had an acute stroke. Her lab work here is unremarkable. I have discussed workup findings and treatment plan with the patient's daughters who are in agreement. Discussed admission with hospitalist, Dr. Konrad Dolores, and patient admitted for further care.  Final Clinical Impressions(s) / ED Diagnoses   Final  diagnoses:  Stroke (cerebrum) (HCC)  Stroke (cerebrum) [facility])    New Prescriptions New Prescriptions   No medications on file     Laurence Spates, MD 06/24/16 [phone]

## 2016-06-24 NOTE — ED Triage Notes (Signed)
Pt arrives from independent living at [facility] via [place] reporting new onset aphasia and generalized weakness around 1030 today.  EMS reports pt independent with ADLs, uses a walker.

## 2016-06-24 NOTE — ED Notes (Signed)
Attempted to call report

## 2016-06-24 NOTE — Consult Note (Signed)
Requesting Physician: Dr. Clarene Duke    Chief Complaint: aphasia  History obtained from:  Patient   EMS  HPI:                                                                                                                                         Ebony [name] is an 83 y.o. female who lives at her independent living center. Apparently she was acting normal this morning until 10:30 however further investigation into this is very unclear if she was actually speaking normally prior to this. Patient states that when she awoke she felt that she was having difficulty with her speech. Patient has a history of subarachnoid hemorrhage along with a right subdural hemorrhage secondary to fall in the past. She also has a history of intermittent atrial fibrillation on the ICU. Further chart review back into 2011 does not note any paroxysmal A. fib or chronic A. fib and she has never been on any anticoagulants in the past. The only atrial fibrillation that she did have in the past was 1. During the ICU stay during her stroke. When EMS arrived 20 complaint was feeling generally fatigued and tired. They did note that she was having waxing and waning difficulty with expression. Patient was brought to [facility] as a code stroke. On arrival initially patient was able to speak clearly but it was notable after talking to her that she does have expressive aphasia and some word substitution. Patient appeared to be lethargic and talked in a very hypophonic voice. Patient was brought directly to the CT scanner where no acute stroke was noted. Post CT, CTA was obtained which showed no large vessel occlusion. Patient does take aspirin on a daily basis.  Of note on initial arrival EMS to get a blood glucose of 1:30 but while in the ED was 80. Patient has not a diabetic and does not take insulin. Her blood glucose did not drop any further than 80 while in the ED.  Date last known well: Unable to determine Time last known  well: Unable to determine tPA Given: No: no exact LSN           Modified Rankin: Rankin Score=1    Past Medical History:  Diagnosis Date  . Acute ischemic stroke (HCC) 04/11/2012  . Allergic rhinitis   . Anxiety   . Arthritis    Knees  . Depression   . Fibrocystic breast disease   . Hiatal hernia   . Hyperlipidemia   . Hypertension   . IFG (impaired fasting glucose)   . SDH (subdural hematoma) (HCC) 05/16/2011  . Subarachnoid hemorrhage following injury (HCC) 05/16/2011  . Subarachnoid hemorrhage following injury (HCC) 05/16/2011  . Unspecified hypothyroidism 04/15/2012    Past Surgical History:  Procedure Laterality Date  . ABDOMINAL HYSTERECTOMY    . BREAST SURGERY    . EYE SURGERY    .  TONSILLECTOMY      Family History  Problem Relation Age of Onset  . Lung cancer Sister   . CVA Neg Hx   . Diabetes Neg Hx   . Hypertension Neg Hx    Social History:  reports that she has never smoked. She has never used smokeless tobacco. She reports that she does not drink alcohol or use drugs.  Allergies:  Allergies  Allergen Reactions  . Penicillins Diarrhea  . Ace Inhibitors   . Biaxin [Clarithromycin] Diarrhea  . Sulfa Antibiotics Diarrhea  . Cozaar [Losartan Potassium] Rash    Medications:                                                                                                                           Current Facility-Administered Medications  Medication Dose Route Frequency Provider Last Rate Last Dose  . iopamidol (ISOVUE-370) 76 % injection            Current Outpatient Prescriptions  Medication Sig Dispense Refill  . acetaminophen (TYLENOL) 325 MG tablet Take 2 tablets (650 mg total) by mouth every 6 (six) hours as needed.    . ALPRAZolam (XANAX) 0.25 MG tablet 1 by mouth every 8 hours as needed for anxiety control 90 tablet 5  . aspirin 81 MG chewable tablet Chew 81 mg by mouth daily.    . cetirizine (ZYRTEC) 10 MG tablet Take 10 mg by mouth daily.    Marland Kitchen  diltiazem (CARDIZEM CD) 120 MG 24 hr capsule Take 120 mg by mouth daily. for angina.    Marland Kitchen docusate sodium (COLACE) 100 MG capsule Take 100 mg by mouth daily as needed for mild constipation.    Marland Kitchen donepezil (ARICEPT) 10 MG tablet Take 10 mg by mouth daily.     . DULoxetine (CYMBALTA) 60 MG capsule Take 60 mg by mouth daily.    . fluticasone (FLONASE) 50 MCG/ACT nasal spray Place 2 sprays into both nostrils daily. 16 g 6  . HYDROcodone-acetaminophen (NORCO/VICODIN) 5-325 MG per tablet Take one tablet by mouth every 8 hours as needed for pain 90 tablet 0  . levothyroxine (SYNTHROID, LEVOTHROID) 25 MCG tablet Take 1 tablet (25 mcg total) by mouth daily before breakfast.    . Loperamide HCl (LOPERAMIDE A-D PO) Take 4 mg by mouth. Take after 1 st loose stool as needed    . Melatonin 3 MG CAPS By mouth qhs for sleep  0  . memantine (NAMENDA) 10 MG tablet Take 10 mg by mouth 2 (two) times daily.    . metoprolol (TOPROL-XL) 100 MG 24 hr tablet Take 100 mg by mouth every morning. for HTN.    . mirtazapine (REMERON) 15 MG tablet Take 15 mg by mouth at bedtime.    . Multiple Vitamin (MULITIVITAMIN WITH MINERALS) TABS Take 1 tablet by mouth every morning.    . ondansetron (ZOFRAN) 4 MG tablet Take 4 mg by mouth every 6 (six) hours as needed for nausea or vomiting.    Marland Kitchen  sodium chloride (OCEAN) 0.65 % SOLN nasal spray Place 2 sprays into both nostrils 4 (four) times daily as needed for congestion.       ROS:                                                                                                                                       History obtained from Patient and EMS  General ROS: negative for - chills, fatigue, fever, night sweats, weight gain or weight loss Psychological ROS: negative for - behavioral disorder, hallucinations, memory difficulties, mood swings or suicidal ideation Ophthalmic ROS: negative for - blurry vision, double vision, eye pain or loss of vision ENT ROS: negative for -  epistaxis, nasal discharge, oral lesions, sore throat, tinnitus or vertigo Allergy and Immunology ROS: negative for - hives or itchy/watery eyes Hematological and Lymphatic ROS: negative for - bleeding problems, bruising or swollen lymph nodes Endocrine ROS: negative for - galactorrhea, hair pattern changes, polydipsia/polyuria or temperature intolerance Respiratory ROS: negative for - cough, hemoptysis, shortness of breath or wheezing Cardiovascular ROS: negative for - chest pain, dyspnea on exertion, edema or irregular heartbeat Gastrointestinal ROS: negative for - abdominal pain, diarrhea, hematemesis, nausea/vomiting or stool incontinence Genito-Urinary ROS: Positive for - dysuria,  Musculoskeletal ROS: negative for - joint swelling or muscular weakness Neurological ROS: as noted in HPI Dermatological ROS: negative for rash and skin lesion changes  Neurologic Examination:                                                                                                      Weight 73.2 kg (161 lb 6 oz), SpO2 94 %.  HEENT-  Normocephalic, no lesions, without obvious abnormality.  Normal external eye and conjunctiva.  Normal TM's bilaterally.  Normal auditory canals and external ears. Normal external nose, mucus membranes and septum.  Normal pharynx. Cardiovascular- S1, S2 normal, pulses palpable throughout   Lungs- chest clear, no wheezing, rales, normal symmetric air entry, Heart exam - S1, S2 normal, no murmur, no gallop, rate regular Abdomen- normal findings: bowel sounds normal Extremities- no edema Lymph-no adenopathy palpable Musculoskeletal-no joint tenderness, deformity or swelling Skin-warm and dry, no hyperpigmentation, vitiligo, or suspicious lesions  Neurological Examination Mental Status: Alert, oriented, thought content appropriate.  Speech fluent with intermittent evidence of expressive aphasia.  Able to follow simple step commands without difficulty. Cranial Nerves: II:   Visual fields grossly normal,  III,IV, VI: ptosis not present,  V,VII: smile symmetric with slight right nasolabial fold  decrease, facial light touch sensation normal bilaterallyextra-ocular motions intact bilaterally, pupils equal, round, reactive to light and accommodation  VIII: hearing normal bilaterally IX,X: uvula rises symmetrically XI: bilateral shoulder shrug XII: midline tongue extension Motor: Right : Upper extremity   5/5    Left:     Upper extremity   5/5  Lower extremity   5/5     Lower extremity   5/5 Tone and bulk:normal tone throughout; no atrophy noted Sensory: Pinprick and light touch intact throughout, bilaterally Deep Tendon Reflexes: 1+ and symmetric throughout Plantars: Equivocal bilaterally Cerebellar: normal finger-to-nose,and normal heel-to-shin test Gait: Not tested   Lab Results: Basic Metabolic Panel:  Recent Labs Lab 06/24/16 1237  NA 136  K 4.3  CL 100*  GLUCOSE 95  BUN 20  CREATININE 0.90    Liver Function Tests: No results for input(s): AST, ALT, ALKPHOS, BILITOT, PROT, ALBUMIN in the last 168 hours. No results for input(s): LIPASE, AMYLASE in the last 168 hours. No results for input(s): AMMONIA in the last 168 hours.  CBC:  Recent Labs Lab 06/24/16 1228 06/24/16 1237  WBC 9.8  --   NEUTROABS 6.1  --   HGB 11.9* 13.3  HCT 37.6 39.0  MCV 91.5  --   PLT 219  --     Cardiac Enzymes: No results for input(s): CKTOTAL, CKMB, CKMBINDEX, TROPONINI in the last 168 hours.  Lipid Panel: No results for input(s): CHOL, TRIG, HDL, CHOLHDL, VLDL, LDLCALC in the last 168 hours.  CBG:  Recent Labs Lab 06/24/16 1233 06/24/16 1246  GLUCAP 83 80    Microbiology: Results for orders placed or performed during the hospital encounter of 04/09/12  Urine culture     Status: None   Collection Time: 04/09/12 11:14 PM  Result Value Ref Range Status   Specimen Description URINE, CLEAN CATCH  Final   Special Requests Normal  Final    Culture  Setup Time 04/10/2012 22:53  Final   Colony Count NO GROWTH  Final   Culture NO GROWTH  Final   Report Status 04/11/2012 FINAL  Final  Culture, blood (routine x 2)     Status: None   Collection Time: 04/10/12  1:35 AM  Result Value Ref Range Status   Specimen Description BLOOD RIGHT ANTECUBITAL  Final   Special Requests BOTTLES DRAWN AEROBIC AND ANAEROBIC 7CC  Final   Culture NO GROWTH 5 DAYS  Final   Report Status 04/15/2012 FINAL  Final  Culture, blood (routine x 2)     Status: None   Collection Time: 04/10/12  1:39 AM  Result Value Ref Range Status   Specimen Description BLOOD RIGHT ARM  Final   Special Requests BOTTLES DRAWN AEROBIC AND ANAEROBIC 6CC  Final   Culture NO GROWTH 5 DAYS  Final   Report Status 04/15/2012 FINAL  Final  MRSA PCR Screening     Status: None   Collection Time: 04/10/12  4:00 AM  Result Value Ref Range Status   MRSA by PCR NEGATIVE NEGATIVE Final    Comment:        The GeneXpert MRSA Assay (FDA approved for NASAL specimens only), is one component of a comprehensive MRSA colonization surveillance program. It is not intended to diagnose MRSA infection nor to guide or monitor treatment for MRSA infections.  Culture, blood (routine x 2)     Status: None   Collection Time: 04/12/12  2:54 PM  Result Value Ref Range Status   Specimen Description BLOOD RIGHT HAND  Final   Special Requests   Final    BOTTLES DRAWN AEROBIC AND ANAEROBIC AEB=7CC ANA=5CC   Culture NO GROWTH 5 DAYS  Final   Report Status 04/17/2012 FINAL  Final  Culture, blood (routine x 2)     Status: None   Collection Time: 04/12/12  3:28 PM  Result Value Ref Range Status   Specimen Description BLOOD RIGHT ARM  Final   Special Requests   Final    BOTTLES DRAWN AEROBIC AND ANAEROBIC AEB=8CC ANA=4CC   Culture NO GROWTH 5 DAYS  Final   Report Status 04/17/2012 FINAL  Final    Coagulation Studies:  Recent Labs  06/24/16 1228  LABPROT 13.1  INR 0.99    Imaging: Ct Head  Code Stroke W/o Cm  Result Date: 06/24/2016 CLINICAL DATA:  Code stroke.  Aphasia. EXAM: CT HEAD WITHOUT CONTRAST TECHNIQUE: Contiguous axial images were obtained from the base of the skull through the vertex without intravenous contrast. COMPARISON:  03/31/2016 FINDINGS: Brain: Generalized atrophy. Extensive chronic small-vessel ischemic changes of the hemispheric white matter. No sign of acute infarction, mass lesion, hemorrhage, hydrocephalus or extra-axial collection. Vascular: There is atherosclerotic calcification of the major vessels at the base of the brain. Skull: Negative Sinuses/Orbits: Clear/normal Other: None significant ASPECTS (Alberta Stroke Program Early CT Score) - Ganglionic level infarction (caudate, lentiform nuclei, internal capsule, insula, M1-M3 cortex): 7 - Supraganglionic infarction (M4-M6 cortex): 3 Total score (0-10 with 10 being normal): 10 IMPRESSION: 1. No acute finding. Atrophy and chronic small-vessel ischemic changes. 2. ASPECTS is 10 These results were called by telephone at the time of interpretation on 06/24/2016 at 12:41 pm to Dr. Amada Jupiter, who verbally acknowledged these results. Electronically Signed   By: Paulina Fusi M.D.   On: 06/24/2016 12:43       Assessment and plan discussed with with attending physician and they are in agreement.    Felicie Morn PA-C [facility] [phone]  06/24/2016, 1:04 PM   Assessment: 83 y.o. female presenting as a code stroke secondary to generalized malaise and intermittent expressive aphasia. CT of head and CTA of head showed no acute stroke or large vessel occlusion. Patient continues to have intermittent expressive aphasia with word finding difficulties. No last known normal was able to establish that she is not a TPA candidate in the large vessel occlusion that she is not a interventional candidate.  Stroke Risk Factors - hyperlipidemia and hypertension  1. HgbA1c, fasting lipid panel 2. MRI,   of the brain  without contrast 3. PT consult, OT consult, Speech consult 4. Echocardiogram 5. Metabolic workup looking for electrolytes and UTI 6. Prophylactic therapy-Antiplatelet med: Aspirin - dose 325 mg daily 7. Risk factor modification 8. Telemetry monitoring 9. Frequent neuro checks 10 NPO until passes stroke swallow screen 11 please page stroke NP  Or  PA  Or MD from 8am -4 pm  as this patient from this time will be  followed by the stroke.   You can look them up on [email or web address]  Password [email or web address]    Ritta Slot, MD [facility] [phone]  If 7pm- 7am, please page neurology on call as listed in AMION.

## 2016-06-24 NOTE — Code Documentation (Signed)
84yo female arriving to [facility] via [facility] at [facility].  Patient from assisted living facility where she was reportedly normal at breakfast.  Patient was noted by staff to have generalized weakness walking.  LKW 1030 per EMS.  Later staff noticed patient to have intermittent expressive aphasia.  Stroke team at the bedside on patient arrival.  Labs drawn and patient cleared for CT by Dr. Clarene Duke.  Patient to CT with team.  CT and CTA completed.  NIHSS 2, see documentation for details and code stroke times.  Patient with expressive aphasia and right nasolabial fold flattening on exam.  Patient back to the ED room B16 with team.  Dr. Amada Jupiter at the bedside.  Patient reports having issues with speech at breakfast.  LKW unclear.  Patient is outside the window for treatment with tPA per MD.  Patient to be admitted for stroke w/u.  Bedside handoff with ED RN Chrislyn.

## 2016-06-24 NOTE — ED Notes (Signed)
ED Provider at bedside. 

## 2016-06-24 NOTE — H&P (Signed)
History and Physical    Ebony [name] YQM:[id number] DOB: Nov 02, 1931 DOA: 06/24/2016  PCP: Kirt Boys, DO Patient coming from: SNF  Chief Complaint: confusion  HPI: Ebony [name] is a 83 y.o. female with medical history significant of CVA, depression, hiatal hernia/GERD, hyperlipidemia, hypertension, subdural hematoma, TBI, hypothyroidism, presenting with aphasia. Unclear as to when symptoms started. May have started at time of waking versus at breakfast at 10:30. Staff first noted patient talking with difficulty and with generalized weakness during breakfast.  History obtained by report from EMS, nursing home staff, EDP and patient's daughters. At baseline patient does not struggle with any kind of memory problems or aphasia.   At baseline patient is able to perform ADLs and ambulatory walker.   ED Course: Objective findings outlined below. Patient's symptoms have remained constant since onset.  Review of Systems: As per HPI otherwise 10 point review of systems negative.   Ambulatory Status: Ambulates with a walker at baseline  Past Medical History:  Diagnosis Date  . Acute ischemic stroke (HCC) 04/11/2012  . Allergic rhinitis   . Anxiety   . Arthritis    Knees  . Depression   . Fibrocystic breast disease   . Hiatal hernia   . Hyperlipidemia   . Hypertension   . IFG (impaired fasting glucose)   . SDH (subdural hematoma) (HCC) 05/16/2011  . Subarachnoid hemorrhage following injury (HCC) 05/16/2011  . Subarachnoid hemorrhage following injury (HCC) 05/16/2011  . Unspecified hypothyroidism 04/15/2012    Past Surgical History:  Procedure Laterality Date  . ABDOMINAL HYSTERECTOMY    . BREAST SURGERY    . EYE SURGERY    . TONSILLECTOMY      Social History   Social History  . Marital status: Married    Spouse name: N/A  . Number of children: N/A  . Years of education: N/A   Occupational History  . Not on file.   Social History Main Topics  . Smoking status:  Never Smoker  . Smokeless tobacco: Never Used  . Alcohol use No  . Drug use: No  . Sexual activity: No   Other Topics Concern  . Not on file   Social History Narrative  . No narrative on file    Allergies  Allergen Reactions  . Sulfa Antibiotics Diarrhea  . Ace Inhibitors Other (See Comments)    unspecified  . Biaxin [Clarithromycin] Diarrhea  . Penicillins Diarrhea  . Cozaar [Losartan Potassium] Rash    Family History  Problem Relation Age of Onset  . Lung cancer Sister   . CVA Neg Hx   . Diabetes Neg Hx   . Hypertension Neg Hx     Prior to Admission medications   Medication Sig Start Date End Date Taking? Authorizing Provider  acetaminophen (TYLENOL) 325 MG tablet Take 2 tablets (650 mg total) by mouth every 6 (six) hours as needed. 04/15/12   Christiane Ha, MD  ALPRAZolam Prudy Feeler) 0.25 MG tablet 1 by mouth every 8 hours as needed for anxiety control 12/12/14   Kimber Relic, MD  aspirin 81 MG chewable tablet Chew 81 mg by mouth daily.    Historical Provider, MD  cetirizine (ZYRTEC) 10 MG tablet Take 10 mg by mouth daily.    Historical Provider, MD  diltiazem (CARDIZEM CD) 120 MG 24 hr capsule Take 120 mg by mouth daily. for angina.    Historical Provider, MD  docusate sodium (COLACE) 100 MG capsule Take 100 mg by mouth daily as needed for  mild constipation.    Historical Provider, MD  donepezil (ARICEPT) 10 MG tablet Take 10 mg by mouth daily.     Historical Provider, MD  DULoxetine (CYMBALTA) 60 MG capsule Take 60 mg by mouth daily.    Historical Provider, MD  fluticasone (FLONASE) 50 MCG/ACT nasal spray Place 2 sprays into both nostrils daily. 06/08/14   Sharon Seller, NP  HYDROcodone-acetaminophen (NORCO/VICODIN) 5-325 MG per tablet Take one tablet by mouth every 8 hours as needed for pain 04/13/14   Kirt Boys, DO  levothyroxine (SYNTHROID, LEVOTHROID) 25 MCG tablet Take 1 tablet (25 mcg total) by mouth daily before breakfast. 04/15/12   Christiane Ha, MD   Loperamide HCl (LOPERAMIDE A-D PO) Take 4 mg by mouth. Take after 1 st loose stool as needed    Historical Provider, MD  Melatonin 3 MG CAPS By mouth qhs for sleep 11/17/13   Sharon Seller, NP  memantine (NAMENDA) 10 MG tablet Take 10 mg by mouth 2 (two) times daily.    Historical Provider, MD  metoprolol (TOPROL-XL) 100 MG 24 hr tablet Take 100 mg by mouth every morning. for HTN.    Historical Provider, MD  mirtazapine (REMERON) 15 MG tablet Take 15 mg by mouth at bedtime.    Historical Provider, MD  Multiple Vitamin (MULITIVITAMIN WITH MINERALS) TABS Take 1 tablet by mouth every morning.    Historical Provider, MD  ondansetron (ZOFRAN) 4 MG tablet Take 4 mg by mouth every 6 (six) hours as needed for nausea or vomiting.    Historical Provider, MD  sodium chloride (OCEAN) 0.65 % SOLN nasal spray Place 2 sprays into both nostrils 4 (four) times daily as needed for congestion.    Historical Provider, MD    Physical Exam: Vitals:   06/24/16 1415 06/24/16 1430 06/24/16 1445 06/24/16 1500  BP: 115/64 (!) 126/57  140/65  Pulse: 63 61  65  Resp: 17 (!) 21  14  SpO2: 93% 91% 95% 97%  Weight:      Height:         General:  Appears calm and comfortable Eyes:  PERRL, EOMI, normal lids, iris ENT:  grossly normal hearing, lips & tongue, mmm Neck:  no LAD, masses or thyromegaly Cardiovascular: No carotid bruits, RRR, no m/r/g. No LE edema.  Respiratory:  CTA bilaterally, no w/r/r. Normal respiratory effort. Abdomen:  soft, ntnd, NABS Skin:  no rash or induration seen on limited exam Musculoskeletal:  good ROM, no bony abnormality Psychiatric:  grossly normal mood and affect, speech fluent and appropriate, AOx3 Neurologic:  CN 2-12 grossly intact, 4 out of 5 grip strength bilaterally. Slight contracture of the right hand with difficulty with extension of digits, lower extremity hip flexion 3 out of 5 bilateral, No dysmetria, sensation intact, expressive aphasia.   Labs on Admission: I have  personally reviewed following labs and imaging studies  CBC:  Recent Labs Lab 06/24/16 1228 06/24/16 1237  WBC 9.8  --   NEUTROABS 6.1  --   HGB 11.9* 13.3  HCT 37.6 39.0  MCV 91.5  --   PLT 219  --    Basic Metabolic Panel:  Recent Labs Lab 06/24/16 1228 06/24/16 1237  NA 136 136  K 3.9 4.3  CL 100* 100*  CO2 25  --   GLUCOSE 85 95  BUN 14 20  CREATININE 0.94 0.90  CALCIUM 9.3  --    GFR: Estimated Creatinine Clearance: 46.2 mL/min (by C-G formula based on SCr of  0.9 mg/dL). Liver Function Tests:  Recent Labs Lab 06/24/16 1228  AST 27  ALT 18  ALKPHOS 82  BILITOT 0.5  PROT 7.0  ALBUMIN 3.9   No results for input(s): LIPASE, AMYLASE in the last 168 hours. No results for input(s): AMMONIA in the last 168 hours. Coagulation Profile:  Recent Labs Lab 06/24/16 1228  INR 0.99   Cardiac Enzymes: No results for input(s): CKTOTAL, CKMB, CKMBINDEX, TROPONINI in the last 168 hours. BNP (last 3 results) No results for input(s): PROBNP in the last 8760 hours. HbA1C: No results for input(s): HGBA1C in the last 72 hours. CBG:  Recent Labs Lab 06/24/16 1233 06/24/16 1246 06/24/16 1343 06/24/16 1445  GLUCAP 83 80 72 83   Lipid Profile: No results for input(s): CHOL, HDL, LDLCALC, TRIG, CHOLHDL, LDLDIRECT in the last 72 hours. Thyroid Function Tests: No results for input(s): TSH, T4TOTAL, FREET4, T3FREE, THYROIDAB in the last 72 hours. Anemia Panel: No results for input(s): VITAMINB12, FOLATE, FERRITIN, TIBC, IRON, RETICCTPCT in the last 72 hours. Urine analysis:    Component Value Date/Time   COLORURINE YELLOW 06/24/2016 1413   APPEARANCEUR CLEAR 06/24/2016 1413   LABSPEC 1.024 06/24/2016 1413   PHURINE 6.0 06/24/2016 1413   GLUCOSEU NEGATIVE 06/24/2016 1413   HGBUR NEGATIVE 06/24/2016 1413   BILIRUBINUR NEGATIVE 06/24/2016 1413   KETONESUR NEGATIVE 06/24/2016 1413   PROTEINUR NEGATIVE 06/24/2016 1413   UROBILINOGEN 1.0 04/12/2012 1451   NITRITE  NEGATIVE 06/24/2016 1413   LEUKOCYTESUR NEGATIVE 06/24/2016 1413    Creatinine Clearance: Estimated Creatinine Clearance: 46.2 mL/min (by C-G formula based on SCr of 0.9 mg/dL).  Sepsis Labs: (procalcitonin:4,lacticidven:4) )No results found for this or any previous visit (from the past 240 hour(s)).   Radiological Exams on Admission: Ct Angio Head W Or Wo Contrast  Result Date: 06/24/2016 CLINICAL DATA:  Acute presentation with aphasia.  Code stroke. EXAM: CT ANGIOGRAPHY HEAD AND NECK TECHNIQUE: Multidetector CT imaging of the head and neck was performed using the standard protocol during bolus administration of intravenous contrast. Multiplanar CT image reconstructions and MIPs were obtained to evaluate the vascular anatomy. Carotid stenosis measurements (when applicable) are obtained utilizing NASCET criteria, using the distal internal carotid diameter as the denominator. CONTRAST:  50 cc Isovue 370 COMPARISON:  Head CT earlier same day. FINDINGS: CTA NECK FINDINGS Aortic arch: Aortic atherosclerosis. No aneurysm or dissection. Branching pattern of the brachiocephalic vessels from the arch is normal without origin stenosis. Right carotid system: Common carotid artery widely patent to the bifurcation. Carotid bifurcation normal without atherosclerotic disease, stenosis or irregularity. Cervical internal carotid artery widely patent. Left carotid system: Common carotid artery widely patent to the bifurcation. Carotid bifurcation normal without atherosclerotic change, narrowing or irregularity. Cervical ICA is tortuous but normal. Vertebral arteries: Both vertebral arteries are patent with the left being dominant. No vertebral artery origin stenosis. No stenotic disease seen along the course of either artery. Skeleton: Ordinary cervical spondylosis. Other neck: No mass or lymphadenopathy. Upper chest: Mild apical scarring. Review of the MIP images confirms the above findings CTA HEAD FINDINGS  Anterior circulation: Both internal carotid arteries are widely patent through the skullbase. There is mild atherosclerotic calcification in the siphon regions but no stenosis. Anterior and middle cerebral vessels are patent without stenosis, aneurysm or vascular malformation. No emboli seen. No detected missing branches. Posterior circulation: Both vertebral arteries are widely patent to the basilar with the left being dominant. No basilar stenosis. Posterior circulation branch vessels are normal. Venous sinuses: Patent and  normal Anatomic variants: None significant Delayed phase: No abnormal enhancement Review of the MIP images confirms the above findings IMPRESSION: Negative CT angiography of the neck and head. No carotid bifurcation disease. No vertebral origin disease. No intracranial stenosis or occlusion. These results were called by telephone at the time of interpretation on 06/24/2016 at 12:55 PM to Dr. Ritta Slot , who verbally acknowledged these results. Electronically Signed   By: Paulina Fusi M.D.   On: 06/24/2016 13:02   Ct Angio Neck W Or Wo Contrast  Result Date: 06/24/2016 CLINICAL DATA:  Acute presentation with aphasia.  Code stroke. EXAM: CT ANGIOGRAPHY HEAD AND NECK TECHNIQUE: Multidetector CT imaging of the head and neck was performed using the standard protocol during bolus administration of intravenous contrast. Multiplanar CT image reconstructions and MIPs were obtained to evaluate the vascular anatomy. Carotid stenosis measurements (when applicable) are obtained utilizing NASCET criteria, using the distal internal carotid diameter as the denominator. CONTRAST:  50 cc Isovue 370 COMPARISON:  Head CT earlier same day. FINDINGS: CTA NECK FINDINGS Aortic arch: Aortic atherosclerosis. No aneurysm or dissection. Branching pattern of the brachiocephalic vessels from the arch is normal without origin stenosis. Right carotid system: Common carotid artery widely patent to the bifurcation.  Carotid bifurcation normal without atherosclerotic disease, stenosis or irregularity. Cervical internal carotid artery widely patent. Left carotid system: Common carotid artery widely patent to the bifurcation. Carotid bifurcation normal without atherosclerotic change, narrowing or irregularity. Cervical ICA is tortuous but normal. Vertebral arteries: Both vertebral arteries are patent with the left being dominant. No vertebral artery origin stenosis. No stenotic disease seen along the course of either artery. Skeleton: Ordinary cervical spondylosis. Other neck: No mass or lymphadenopathy. Upper chest: Mild apical scarring. Review of the MIP images confirms the above findings CTA HEAD FINDINGS Anterior circulation: Both internal carotid arteries are widely patent through the skullbase. There is mild atherosclerotic calcification in the siphon regions but no stenosis. Anterior and middle cerebral vessels are patent without stenosis, aneurysm or vascular malformation. No emboli seen. No detected missing branches. Posterior circulation: Both vertebral arteries are widely patent to the basilar with the left being dominant. No basilar stenosis. Posterior circulation branch vessels are normal. Venous sinuses: Patent and normal Anatomic variants: None significant Delayed phase: No abnormal enhancement Review of the MIP images confirms the above findings IMPRESSION: Negative CT angiography of the neck and head. No carotid bifurcation disease. No vertebral origin disease. No intracranial stenosis or occlusion. These results were called by telephone at the time of interpretation on 06/24/2016 at 12:55 PM to Dr. Ritta Slot , who verbally acknowledged these results. Electronically Signed   By: Paulina Fusi M.D.   On: 06/24/2016 13:02   Ct Head Code Stroke W/o Cm  Result Date: 06/24/2016 CLINICAL DATA:  Code stroke.  Aphasia. EXAM: CT HEAD WITHOUT CONTRAST TECHNIQUE: Contiguous axial images were obtained from the  base of the skull through the vertex without intravenous contrast. COMPARISON:  03/31/2016 FINDINGS: Brain: Generalized atrophy. Extensive chronic small-vessel ischemic changes of the hemispheric white matter. No sign of acute infarction, mass lesion, hemorrhage, hydrocephalus or extra-axial collection. Vascular: There is atherosclerotic calcification of the major vessels at the base of the brain. Skull: Negative Sinuses/Orbits: Clear/normal Other: None significant ASPECTS (Alberta Stroke Program Early CT Score) - Ganglionic level infarction (caudate, lentiform nuclei, internal capsule, insula, M1-M3 cortex): 7 - Supraganglionic infarction (M4-M6 cortex): 3 Total score (0-10 with 10 being normal): 10 IMPRESSION: 1. No acute finding. Atrophy and chronic small-vessel  ischemic changes. 2. ASPECTS is 10 These results were called by telephone at the time of interpretation on 06/24/2016 at 12:41 pm to Dr. Amada Jupiter, who verbally acknowledged these results. Electronically Signed   By: Paulina Fusi M.D.   On: 06/24/2016 12:43    EKG: ordered   Assessment/Plan Active Problems:   Ischemic stroke [facility])   Depression with anxiety   Hypothyroid   GERD (gastroesophageal reflux disease)   Stroke: Likely given persistent expressive aphasia and slight R hand contracture. Neuro following. h/o CVA in past w/o residual deficits. Pt outside the window for thrombolytics - MRI/MRA, Carotid doppler, Echo - A1c, Lipids - Permissive HTN - PT/OT/SLP - f/u Neuro recs - ASA  HTN:  - Hold metop, lisinopril, Dilt  Hypothyroidism: - continue synthroid  Depression/Anxiety:  - continue cymbalta and xanax  GERD: - continue ppi   DVT prophylaxis: Hep  Code Status: DNR  Family Communication: daughters  Disposition Plan: pending improvement and workup  Consults called: neuro  Admission status: inpatient.     Kentarius Partington J MD [facility]  If 7PM-7AM, please contact  night-coverage [email or web address] Password [email or web address]  06/24/2016, 3:20 PM

## 2016-06-24 NOTE — ED Notes (Signed)
Dr. Merrell at bedside. 

## 2016-06-25 ENCOUNTER — Inpatient Hospital Stay (HOSPITAL_COMMUNITY)
Admit: 2016-06-25 | Discharge: 2016-06-25 | Disposition: A | Discharge status: Home / Self Care | Payer: MEDICARE | Attending: Neurology | Admitting: Neurology

## 2016-06-25 ENCOUNTER — Encounter (HOSPITAL_COMMUNITY): Payer: Medicare HMO

## 2016-06-25 ENCOUNTER — Inpatient Hospital Stay (HOSPITAL_COMMUNITY): Payer: MEDICARE

## 2016-06-25 DIAGNOSIS — E039 Hypothyroidism, unspecified: Secondary | ICD-10-CM | POA: Diagnosis not present

## 2016-06-25 DIAGNOSIS — R4701 Aphasia: Secondary | ICD-10-CM

## 2016-06-25 DIAGNOSIS — I6789 Other cerebrovascular disease: Secondary | ICD-10-CM

## 2016-06-25 DIAGNOSIS — G9340 Encephalopathy, unspecified: Secondary | ICD-10-CM | POA: Insufficient documentation

## 2016-06-25 DIAGNOSIS — R410 Disorientation, unspecified: Secondary | ICD-10-CM

## 2016-06-25 DIAGNOSIS — I639 Cerebral infarction, unspecified: Secondary | ICD-10-CM | POA: Diagnosis not present

## 2016-06-25 LAB — ECHOCARDIOGRAM COMPLETE
HEIGHTINCHES: 63 in
WEIGHTICAEL: 2582.03 [oz_av]

## 2016-06-25 LAB — CBC
HCT: 38.1 % (ref 36.0–46.0)
Hemoglobin: 12 g/dL (ref 12.0–15.0)
MCH: 29 pg (ref 26.0–34.0)
MCHC: 31.5 g/dL (ref 30.0–36.0)
MCV: 92 fL (ref 78.0–100.0)
PLATELETS: 223 10*3/uL (ref 150–400)
RBC: 4.14 MIL/uL (ref 3.87–5.11)
RDW: 14.6 % (ref 11.5–15.5)
WBC: 7.7 10*3/uL (ref 4.0–10.5)

## 2016-06-25 LAB — BASIC METABOLIC PANEL
ANION GAP: 10 (ref 5–15)
BUN: 11 mg/dL (ref 6–20)
CALCIUM: 8.8 mg/dL — AB (ref 8.9–10.3)
CO2: 26 mmol/L (ref 22–32)
Chloride: 103 mmol/L (ref 101–111)
Creatinine, Ser: 0.81 mg/dL (ref 0.44–1.00)
GLUCOSE: 100 mg/dL — AB (ref 65–99)
POTASSIUM: 3.7 mmol/L (ref 3.5–5.1)
SODIUM: 139 mmol/L (ref 135–145)

## 2016-06-25 LAB — GLUCOSE, CAPILLARY
GLUCOSE-CAPILLARY: 87 mg/dL (ref 65–99)
GLUCOSE-CAPILLARY: 110 mg/dL — AB (ref 65–99)
Glucose-Capillary: 184 mg/dL — ABNORMAL HIGH (ref 65–99)
Glucose-Capillary: 105 mg/dL — ABNORMAL HIGH (ref 65–99)
Glucose-Capillary: 136 mg/dL — ABNORMAL HIGH (ref 65–99)
Glucose-Capillary: 96 mg/dL (ref 65–99)

## 2016-06-25 LAB — LIPID PANEL
CHOL/HDL RATIO: 4.1 ratio
Cholesterol: 189 mg/dL (ref 0–200)
HDL: 46 mg/dL (ref >40)
LDL CALC: 105 mg/dL — AB (ref 0–99)
Triglycerides: 189 mg/dL — ABNORMAL HIGH (ref <150)
VLDL: 38 mg/dL (ref 0–40)

## 2016-06-25 LAB — TSH: TSH: 0.938 u[IU]/mL (ref 0.350–4.500)

## 2016-06-25 MED ORDER — METOPROLOL SUCCINATE ER 100 MG PO TB24
100.0000 mg | ORAL_TABLET | Freq: Every day | ORAL | Status: DC
  Administered 2016-06-25 – 2016-06-26 (×2): 100 mg via ORAL
  Filled 2016-06-25 (×2): qty 1

## 2016-06-25 NOTE — Progress Notes (Signed)
  Echocardiogram 2D Echocardiogram has been performed.  Ebony [name] 06/25/2016, 12:24 PM

## 2016-06-25 NOTE — Procedures (Signed)
HPI:  83 y/o with  MS change  TECHNICAL SUMMARY:  A multichannel referential and bipolar montage EEG using the standard international 10-20 system was performed on the patient described as awake and drowsy.  The dominant background activity consists of 8 hertz activity seen most prominantly over the posterior head region.  Photic the 7 Hz activity can be seen intermixed.  As the patient becomes drowsy, 1-1/2-3 Hz activity is seen in the frontal head regions.  The backgound activity is nonreactive to eye opening and closing procedures.  Low voltage fast (beta) activity is distributed symmetrically and maximally over the anterior head regions.  ACTIVATION:  Stepwise photic stimulation and hyperventilation are not performed.  EPILEPTIFORM ACTIVITY:  There were no spikes, sharp waves or paroxysmal activity.  SLEEP:  Physiologic drowsiness is noted, but no stage II sleep.   IMPRESSION:  This EEG demonstrated no focal, hemispheric, or lateralizing features.  There was a mild degree of slowing of electrocerebral activity which can be seen in a wide variety of encephalopathic states including those of extreme drowsiness, toxic, metabolic, or degenerative nature.  There was no epileptiform activity recorded on today's tracing.  Correlate clinically.

## 2016-06-25 NOTE — Progress Notes (Addendum)
PROGRESS NOTE    Ebony [name]  RUE:[id number] DOB: [birth year] DOA: 06/24/2016 PCP: Florentina Jenny, MD   Brief Narrative: 83 y.o. female with medical history significant of CVA, depression, hiatal hernia/GERD, hyperlipidemia, hypertension, subdural hematoma, TBI, hypothyroidism, presenting with aphasia and confusion.  Assessment & Plan:   # Aphasia likely TIA: MRI and MRA of the brain with no acute stroke, consistent with a chronic finding. Patient is still has confusion with no focal neurological deficit, unknown if this is due to delirium. -Echocardiogram with bilateral systolic function, no wall motion abnormalities -EEG with no seizure -Follow-up with neurologist plan and recommendation. -UA with no UTI, chest x-ray with no pneumonia. Patient has no sign of infection -LDL 105, A1c pending -Continue aspirin. -PT, OT and social worker evaluation. Continue supportive care.  # Hypertension; resume metoprolol. Lisinopril and dilt on hold today.  #Hypothyroidism: Continue Synthroid. Check TSH level.  #Acid reflux: Continue PPI.  DVT prophylaxis: Lovenox subcutaneous Code Status: DO NOT RESUSCITATE Family Communication: Discussed with the patient's daughter at bedside Disposition Plan: Likely discharge to a skilled nursing facility in 1-2 days    Consultants:   Neurologist  Procedures: None Antimicrobials: None  Subjective: Patient was seen and examined at bedside. Patient is alert awake but confused. Denied chest pain, shortness of breath, headache or dizziness. Review of systems unreliable.  Objective: Vitals:   06/25/16 0118 06/25/16 0330 06/25/16 1100 06/25/16 1420  BP: (!) 157/72 137/75 (!) 159/71 140/77  Pulse: 70 62 80 74  Resp: Temp: 97.9 F (36.6 C) 97.7 F (36.5 C) 97.9 F (36.6 C) 98 F (36.7 C)  TempSrc: Oral Oral Oral Oral  SpO2:  93% 93% 95%  Weight:      Height:        Intake/Output Summary (Last 24 hours) at 06/25/16 1436 Last  data filed at 06/25/16 0600  Gross per 24 hour  Intake              600 ml  Output                0 ml  Net              600 ml   Filed Weights   06/24/16 1200  Weight: 73.2 kg (161 lb 6 oz)    Examination:  General exam: Elderly female lying in bed comfortably, not in distress Respiratory system: Clear to auscultation. Respiratory effort normal. No wheezing or crackle Cardiovascular system: S1 & S2 heard, RRR.  No pedal edema. Gastrointestinal system: Abdomen is nondistended, soft and nontender. Normal bowel sounds heard. Central nervous system: Alert awake but not oriented Skin: No rashes, lesions or ulcers Psychiatry: Judgement and insight appear impaired.     Data Reviewed: I have personally reviewed following labs and imaging studies  CBC:  Recent Labs Lab 06/24/16 1228 06/24/16 1237 06/25/16 0805  WBC 9.8  --  7.7  NEUTROABS 6.1  --   --   HGB 11.9* 13.3 12.0  HCT 37.6 39.0 38.1  MCV 91.5  --  92.0  PLT 219  --  223   Basic Metabolic Panel:  Recent Labs Lab 06/24/16 1228 06/24/16 1237 06/25/16 0805  NA 136 136 139  K 3.9 4.3 3.7  CL 100* 100* 103  CO2 25  --  26  GLUCOSE 85 95 100*  BUN CREATININE 0.94 0.90 0.81  CALCIUM 9.3  --  8.8*   GFR: Estimated Creatinine Clearance:  51.3 mL/min (by C-G formula based on SCr of 0.81 mg/dL). Liver Function Tests:  Recent Labs Lab 06/24/16 1228  AST 27  ALT 18  ALKPHOS 82  BILITOT 0.5  PROT 7.0  ALBUMIN 3.9   No results for input(s): LIPASE, AMYLASE in the last 168 hours. No results for input(s): AMMONIA in the last 168 hours. Coagulation Profile:  Recent Labs Lab 06/24/16 1228  INR 0.99   Cardiac Enzymes: No results for input(s): CKTOTAL, CKMB, CKMBINDEX, TROPONINI in the last 168 hours. BNP (last 3 results) No results for input(s): PROBNP in the last 8760 hours. HbA1C: No results for input(s): HGBA1C in the last 72 hours. CBG:  Recent Labs Lab 06/24/16 1944 06/24/16 2335  06/25/16 0348 06/25/16 0836 06/25/16 1151  GLUCAP 89 83 87 110* 184*   Lipid Profile:  Recent Labs  06/25/16 0805  CHOL 189  HDL 46  LDLCALC 105*  TRIG 189*  CHOLHDL 4.1   Thyroid Function Tests: No results for input(s): TSH, T4TOTAL, FREET4, T3FREE, THYROIDAB in the last 72 hours. Anemia Panel: No results for input(s): VITAMINB12, FOLATE, FERRITIN, TIBC, IRON, RETICCTPCT in the last 72 hours. Sepsis Labs: No results for input(s): PROCALCITON, LATICACIDVEN in the last 168 hours.  No results found for this or any previous visit (from the past 240 hour(s)).       Radiology Studies: Ct Angio Head W Or Wo Contrast  Result Date: 06/24/2016 CLINICAL DATA:  Acute presentation with aphasia.  Code stroke. EXAM: CT ANGIOGRAPHY HEAD AND NECK TECHNIQUE: Multidetector CT imaging of the head and neck was performed using the standard protocol during bolus administration of intravenous contrast. Multiplanar CT image reconstructions and MIPs were obtained to evaluate the vascular anatomy. Carotid stenosis measurements (when applicable) are obtained utilizing NASCET criteria, using the distal internal carotid diameter as the denominator. CONTRAST:  50 cc Isovue 370 COMPARISON:  Head CT earlier same day. FINDINGS: CTA NECK FINDINGS Aortic arch: Aortic atherosclerosis. No aneurysm or dissection. Branching pattern of the brachiocephalic vessels from the arch is normal without origin stenosis. Right carotid system: Common carotid artery widely patent to the bifurcation. Carotid bifurcation normal without atherosclerotic disease, stenosis or irregularity. Cervical internal carotid artery widely patent. Left carotid system: Common carotid artery widely patent to the bifurcation. Carotid bifurcation normal without atherosclerotic change, narrowing or irregularity. Cervical ICA is tortuous but normal. Vertebral arteries: Both vertebral arteries are patent with the left being dominant. No vertebral artery  origin stenosis. No stenotic disease seen along the course of either artery. Skeleton: Ordinary cervical spondylosis. Other neck: No mass or lymphadenopathy. Upper chest: Mild apical scarring. Review of the MIP images confirms the above findings CTA HEAD FINDINGS Anterior circulation: Both internal carotid arteries are widely patent through the skullbase. There is mild atherosclerotic calcification in the siphon regions but no stenosis. Anterior and middle cerebral vessels are patent without stenosis, aneurysm or vascular malformation. No emboli seen. No detected missing branches. Posterior circulation: Both vertebral arteries are widely patent to the basilar with the left being dominant. No basilar stenosis. Posterior circulation branch vessels are normal. Venous sinuses: Patent and normal Anatomic variants: None significant Delayed phase: No abnormal enhancement Review of the MIP images confirms the above findings IMPRESSION: Negative CT angiography of the neck and head. No carotid bifurcation disease. No vertebral origin disease. No intracranial stenosis or occlusion. These results were called by telephone at the time of interpretation on 06/24/2016 at 12:55 PM to Dr. Ritta Slot , who verbally acknowledged these results.  Electronically Signed   By: Paulina Fusi M.D.   On: 06/24/2016 13:02   Dg Chest 2 View  Result Date: 06/24/2016 CLINICAL DATA:  Stroke EXAM: CHEST  2 VIEW COMPARISON:  04/12/2012 FINDINGS: Mild cardiomegaly. No overt edema. No confluent opacities or effusions. No acute bony abnormality. IMPRESSION: Cardiomegaly.  No active disease. Electronically Signed   By: Charlett Nose M.D.   On: 06/24/2016 18:03   Ct Angio Neck W Or Wo Contrast  Result Date: 06/24/2016 CLINICAL DATA:  Acute presentation with aphasia.  Code stroke. EXAM: CT ANGIOGRAPHY HEAD AND NECK TECHNIQUE: Multidetector CT imaging of the head and neck was performed using the standard protocol during bolus administration of  intravenous contrast. Multiplanar CT image reconstructions and MIPs were obtained to evaluate the vascular anatomy. Carotid stenosis measurements (when applicable) are obtained utilizing NASCET criteria, using the distal internal carotid diameter as the denominator. CONTRAST:  50 cc Isovue 370 COMPARISON:  Head CT earlier same day. FINDINGS: CTA NECK FINDINGS Aortic arch: Aortic atherosclerosis. No aneurysm or dissection. Branching pattern of the brachiocephalic vessels from the arch is normal without origin stenosis. Right carotid system: Common carotid artery widely patent to the bifurcation. Carotid bifurcation normal without atherosclerotic disease, stenosis or irregularity. Cervical internal carotid artery widely patent. Left carotid system: Common carotid artery widely patent to the bifurcation. Carotid bifurcation normal without atherosclerotic change, narrowing or irregularity. Cervical ICA is tortuous but normal. Vertebral arteries: Both vertebral arteries are patent with the left being dominant. No vertebral artery origin stenosis. No stenotic disease seen along the course of either artery. Skeleton: Ordinary cervical spondylosis. Other neck: No mass or lymphadenopathy. Upper chest: Mild apical scarring. Review of the MIP images confirms the above findings CTA HEAD FINDINGS Anterior circulation: Both internal carotid arteries are widely patent through the skullbase. There is mild atherosclerotic calcification in the siphon regions but no stenosis. Anterior and middle cerebral vessels are patent without stenosis, aneurysm or vascular malformation. No emboli seen. No detected missing branches. Posterior circulation: Both vertebral arteries are widely patent to the basilar with the left being dominant. No basilar stenosis. Posterior circulation branch vessels are normal. Venous sinuses: Patent and normal Anatomic variants: None significant Delayed phase: No abnormal enhancement Review of the MIP images  confirms the above findings IMPRESSION: Negative CT angiography of the neck and head. No carotid bifurcation disease. No vertebral origin disease. No intracranial stenosis or occlusion. These results were called by telephone at the time of interpretation on 06/24/2016 at 12:55 PM to Dr. Ritta Slot , who verbally acknowledged these results. Electronically Signed   By: Paulina Fusi M.D.   On: 06/24/2016 13:02   Mr Brain Wo Contrast  Result Date: 06/24/2016 CLINICAL DATA:  New onset of aphasia.  Symptoms began this morning. EXAM: MRI HEAD WITHOUT CONTRAST MRA HEAD WITHOUT CONTRAST TECHNIQUE: Multiplanar, multiecho pulse sequences of the brain and surrounding structures were obtained without intravenous contrast. Angiographic images of the head were obtained using MRA technique without contrast. COMPARISON:  CT head from same day. CTA head and neck. MRI brain 04/11/2012. FINDINGS: MRI HEAD FINDINGS Brain: The diffusion-weighted images demonstrate no acute or subacute infarction. Moderate atrophy and white matter disease is present bilaterally. No acute hemorrhage or mass lesion is present. Extensive confluent white matter changes are present in the periventricular regions bilaterally. Ventricles are proportionate to the degree of atrophy. White matter changes extend into the brainstem. Vascular: Flow is present in the major intracranial arteries. Skull and upper cervical spine: The  skullbase is within normal limits. The craniocervical junction is normal. Midline sagittal structures are unremarkable. Sinuses/Orbits: The paranasal sinuses and mastoid air cells are clear. Bilateral lens replacements are present. The globes and orbits are otherwise within normal limits. MRA HEAD FINDINGS Internal carotid arteries are within normal limits from the high cervical segments through the ICA termini bilaterally. The A1 and M1 segments are normal. Anterior communicating artery is patent. MCA bifurcations are within  normal limits. The ACA and MCA branch vessels are within normal limits. The left vertebral artery is the dominant vessel. The right PICA origin is just below the field of view. The vertebrobasilar junction is normal. The basilar artery is normal. Both posterior cerebral arteries originate basilar tip. PCA branch vessels are within normal limits. IMPRESSION: 1. No acute cranial abnormality. 2. Advanced atrophy and white matter disease. This likely reflects the sequela of chronic microvascular ischemia. 3. Normal variant MRA circle of Willis without significant proximal stenosis, aneurysm, or branch vessel occlusion. Electronically Signed   By: Marin Roberts M.D.   On: 06/24/2016 19:06   Mr Maxine Glenn Headm  Result Date: 06/24/2016 CLINICAL DATA:  New onset of aphasia.  Symptoms began this morning. EXAM: MRI HEAD WITHOUT CONTRAST MRA HEAD WITHOUT CONTRAST TECHNIQUE: Multiplanar, multiecho pulse sequences of the brain and surrounding structures were obtained without intravenous contrast. Angiographic images of the head were obtained using MRA technique without contrast. COMPARISON:  CT head from same day. CTA head and neck. MRI brain 04/11/2012. FINDINGS: MRI HEAD FINDINGS Brain: The diffusion-weighted images demonstrate no acute or subacute infarction. Moderate atrophy and white matter disease is present bilaterally. No acute hemorrhage or mass lesion is present. Extensive confluent white matter changes are present in the periventricular regions bilaterally. Ventricles are proportionate to the degree of atrophy. White matter changes extend into the brainstem. Vascular: Flow is present in the major intracranial arteries. Skull and upper cervical spine: The skullbase is within normal limits. The craniocervical junction is normal. Midline sagittal structures are unremarkable. Sinuses/Orbits: The paranasal sinuses and mastoid air cells are clear. Bilateral lens replacements are present. The globes and orbits are  otherwise within normal limits. MRA HEAD FINDINGS Internal carotid arteries are within normal limits from the high cervical segments through the ICA termini bilaterally. The A1 and M1 segments are normal. Anterior communicating artery is patent. MCA bifurcations are within normal limits. The ACA and MCA branch vessels are within normal limits. The left vertebral artery is the dominant vessel. The right PICA origin is just below the field of view. The vertebrobasilar junction is normal. The basilar artery is normal. Both posterior cerebral arteries originate basilar tip. PCA branch vessels are within normal limits. IMPRESSION: 1. No acute cranial abnormality. 2. Advanced atrophy and white matter disease. This likely reflects the sequela of chronic microvascular ischemia. 3. Normal variant MRA circle of Willis without significant proximal stenosis, aneurysm, or branch vessel occlusion. Electronically Signed   By: Marin Roberts M.D.   On: 06/24/2016 19:06   Ct Head Code Stroke W/o Cm  Result Date: 06/24/2016 CLINICAL DATA:  Code stroke.  Aphasia. EXAM: CT HEAD WITHOUT CONTRAST TECHNIQUE: Contiguous axial images were obtained from the base of the skull through the vertex without intravenous contrast. COMPARISON:  03/31/2016 FINDINGS: Brain: Generalized atrophy. Extensive chronic small-vessel ischemic changes of the hemispheric white matter. No sign of acute infarction, mass lesion, hemorrhage, hydrocephalus or extra-axial collection. Vascular: There is atherosclerotic calcification of the major vessels at the base of the brain. Skull: Negative Sinuses/Orbits:  Clear/normal Other: None significant ASPECTS (Alberta Stroke Program Early CT Score) - Ganglionic level infarction (caudate, lentiform nuclei, internal capsule, insula, M1-M3 cortex): 7 - Supraganglionic infarction (M4-M6 cortex): 3 Total score (0-10 with 10 being normal): 10 IMPRESSION: 1. No acute finding. Atrophy and chronic small-vessel ischemic  changes. 2. ASPECTS is 10 These results were called by telephone at the time of interpretation on 06/24/2016 at 12:41 pm to Dr. Amada Jupiter, who verbally acknowledged these results. Electronically Signed   By: Paulina Fusi M.D.   On: 06/24/2016 12:43        Scheduled Meds: . aspirin  300 mg Rectal Daily   Or  . aspirin  325 mg Oral Daily  . DULoxetine  40 mg Oral Daily  . heparin  5,000 Units Subcutaneous Q8H  . levothyroxine  25 mcg Oral QHS  . pantoprazole  20 mg Oral Daily  . psyllium  1 packet Oral QHS   Continuous Infusions: . sodium chloride 50 mL/hr at 06/24/16 1723     LOS: 1 day    Carsyn Taubman Jaynie Collins, MD [facility] Pager [phone]  If 7PM-7AM, please contact night-coverage [email or web address] Password [employer] 06/25/2016, 2:36 PM

## 2016-06-25 NOTE — Progress Notes (Signed)
Routine EEG completed, results pending. 

## 2016-06-25 NOTE — Progress Notes (Signed)
PT Cancellation Note  Patient Details Name: Ebony [name] MRN: [id number] DOB: 03-Apr-1931   Cancelled Treatment:    Reason Eval/Treat Not Completed: Patient at procedure or test/unavailable. Pt off of the floor for an EEG. PT will continue to f/u with pt as available.   Alessandra Bevels Makiya Jeune 06/25/2016, 10:27 AM

## 2016-06-25 NOTE — Evaluation (Signed)
Clinical/Bedside Swallow Evaluation Patient Details  Name: Ebony [name] MRN: [id number] Date of Birth: 12-01-1931  Today's Date: 06/25/2016 Time: SLP Start Time (ACUTE ONLY): 0855 SLP Stop Time (ACUTE ONLY): 0923 SLP Time Calculation (min) (ACUTE ONLY): 28 min  Past Medical History:  Past Medical History:  Diagnosis Date  . Acute ischemic stroke (HCC) 04/11/2012  . Allergic rhinitis   . Anxiety   . Arthritis    Knees  . Depression   . Fibrocystic breast disease   . Hiatal hernia   . Hyperlipidemia   . Hypertension   . IFG (impaired fasting glucose)   . SDH (subdural hematoma) (HCC) 05/16/2011  . Subarachnoid hemorrhage following injury (HCC) 05/16/2011  . Subarachnoid hemorrhage following injury (HCC) 05/16/2011  . Unspecified hypothyroidism 04/15/2012   Past Surgical History:  Past Surgical History:  Procedure Laterality Date  . ABDOMINAL HYSTERECTOMY    . BREAST SURGERY    . EYE SURGERY    . TONSILLECTOMY     HPI:  83 y.o.femalefrom independent living presenting as a code stroke secondary to generalized malaise and intermittent expressive aphasia. MRI and CT of head  showed no acute stroke. Patient continues to have intermittent expressive aphasia with word finding difficulties. Per chart she has a history of SAH and CVA, but no residual deficits.    Assessment / Plan / Recommendation Clinical Impression  Pt demonstrates adequate swallow function with no signs of dysphagia with any consistency tested. Recommend a regular diet and thin liquids. Will sign off for dysphagia and f/u for cognitive linguistic needs only.  SLP Visit Diagnosis: Aphasia (R47.01)    Aspiration Risk  Mild aspiration risk    Diet Recommendation Regular;Thin liquid   Liquid Administration via: Cup;Straw Medication Administration: Whole meds with liquid Supervision: Patient able to self feed    Other  Recommendations Oral Care Recommendations: Oral care BID   Follow up Recommendations  Inpatient Rehab      Frequency and Duration min 2x/week          Prognosis        Swallow Study   General HPI: 83 y.o.femalefrom independent living presenting as a code stroke secondary to generalized malaise and intermittent expressive aphasia. MRI and CT of head  showed no acute stroke. Patient continues to have intermittent expressive aphasia with word finding difficulties. Per chart she has a history of SAH and CVA, but no residual deficits.  Type of Study: Bedside Swallow Evaluation Diet Prior to this Study: NPO Temperature Spikes Noted: No Respiratory Status: Room air History of Recent Intubation: No Behavior/Cognition: Alert;Cooperative;Pleasant mood Oral Cavity Assessment: Within Functional Limits Oral Care Completed by SLP: No Oral Cavity - Dentition: Poor condition Vision: Functional for self-feeding Self-Feeding Abilities: Able to feed self Patient Positioning: Upright in bed Baseline Vocal Quality: Normal Volitional Cough: Strong Volitional Swallow: Able to elicit    Oral/Motor/Sensory Function Overall Oral Motor/Sensory Function: Within functional limits   Ice Chips     Thin Liquid Thin Liquid: Within functional limits Presentation: Cup;Straw;Self Fed    Nectar Thick Nectar Thick Liquid: Not tested   Honey Thick Honey Thick Liquid: Not tested   Puree Puree: Within functional limits   Solid   GO   Solid: Within functional limits       [facility] [place], [place] CCC-SLP [phone]  Ebony [name], Ebony [name] 06/25/2016,9:35 AM

## 2016-06-25 NOTE — Progress Notes (Signed)
06/25/16 0900  SLP Visit Information  SLP Received On 06/25/16  SLP Time Calculation  SLP Start Time (ACUTE ONLY) 0855  SLP Stop Time (ACUTE ONLY) 1914  SLP Time Calculation (min) (ACUTE ONLY) 33 min  General Information  HPI 83 y.o.femalefrom independent living presenting as a code stroke secondary to generalized malaise and intermittent expressive aphasia. MRI and CT of head  showed no acute stroke. Patient continues to have intermittent expressive aphasia with word finding difficulties. Per chart she has a history of SAH and CVA, but no residual deficits.   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, industrial/product)  Pain Assessment  Pain Assessment No/denies pain  Cognition  Overall Cognitive Status Impaired/Different from baseline  Arousal/Alertness Awake/alert  Orientation Level Oriented to person;Oriented to place;Disoriented to time;Disoriented to situation  Attention Sustained  Memory Impaired  Memory Impairment Decreased recall of new information  Auditory Comprehension  Overall Auditory Comprehension Appears within functional limits for tasks assessed  Visual Recognition/Discrimination  Discrimination WFL  Reading Comprehension  Reading Status X  Word level Within functional limits  Sentence Level WFL  Paragraph Level WFL  Verbal Expression  Overall Verbal Expression Impaired  Initiation No impairment  Automatic Speech Name;Social Response;Counting;Day of week  Level of Generative/Spontaneous Verbalization Phrase;Sentence;Conversation  Repetition Impaired  Level of Impairment Word level;Phrase level;Sentence level  Naming Impairment  Responsive Not tested  Confrontation Impaired  Convergent Not tested  Divergent Not tested  Verbal Errors Semantic paraphasias;Phonemic paraphasias;Perseveration;Aware of errors;Not aware of errors  Pragmatics No impairment   Effective Techniques Semantic cues  Written Expression  Dominant Hand Right  Written Expression Not tested  Motor Speech  Overall Motor Speech Appears within functional limits for tasks assessed  Assessment  Clinical Impression Statement (ACUTE ONLY) Pt demonstrates a primary expressive aphasia with some cognitive impairment noted as well.   Pts comprehension of basic verbal and written language as well as motor speech WNL, but expressive language with naming, reading, conversation characterized by intermittent, primarily semantic paraphasias or perseveration of words. Pt is intermittently aware of difficulty and attempts self correction. In addition, pt observed to have some off topic speech, difficulty with reasoning, outside of language impairment. Long term memory appears in tact, but pts ability to verbalize recent events is impaired.   Given no acute finding on MRI other than advanced atrophy, would be concerned for onset of a primary progressive aphasia, though pt would need more extensive standardized testing for cognition and language to aid in any diagnosis, likely conducted in rehab or outpatient setting. Recommend ongoing interventions from SLP for expressive language and cognitive function with f/u at next level of care depending on physical needs. Would expect need for increased level of supervision, possibly assisted living or SNF, though CIR suggested if physical rehab needed.   SLP Recommendation/Assessment Patient needs continued Speech Lanaguage Pathology Services  SLP Visit Diagnosis Aphasia (R47.01)  Problem List Verbal expression;Orientation;Memory;Reasoning;Problem Solving  Plan  Speech Therapy Frequency (ACUTE ONLY) min 2x/week  Duration 2 weeks  Treatment/Interventions Language facilitation;Cognitive reorganization;Functional tasks;SLP instruction and feedback;Compensatory strategies;Patient/family education  Potential to Achieve Goals (ACUTE ONLY) Good  SLP  Recommendations  Recommendations for Other Services Rehab consult  Follow up Recommendations Inpatient Rehab  Individuals Consulted  Consulted and Agree with Results and Recommendations Patient  SLP Evaluations  $ SLP Speech Visit 1 Procedure  SLP Evaluations  $ SLP EVAL LANGUAGE/SOUND PRODUCTION 1 Procedure

## 2016-06-25 NOTE — Progress Notes (Signed)
Subjective: Improved, but still with some difficulty.   Exam: Vitals:   06/25/16 1420 06/25/16 1732  BP: 140/77 (!) 162/83  Pulse: 74 79  Resp: 18 18  Temp: 98 F (36.7 C) 98.8 F (37.1 C)   Gen: In bed, NAD Resp: non-labored breathing, no acute distress Abd: soft, nt  Neuro: MS: awake, alert, tangential speech.  ZO:XWRUE, VFF, face symmetric Motor: full strength throughout Sensory:intact to LT  Pertinent Labs: TSH normal.   Impression: [name] with altered mental status. At this point with negative MRI/EEG, I think that his may be a mild delirium, though cause is not currently clear. With some improvement, I would favor continued monitoring.   Recommendations: 1) will continue to follow.    Ritta Slot, MD [facility] [phone]  If 7pm- 7am, please page neurology on call as listed in AMION.

## 2016-06-25 NOTE — Progress Notes (Signed)
OT Cancellation Note  Patient Details Name: Ebony [name] MRN: [id number] DOB: March 21, 1931   Cancelled Treatment:    Reason Eval/Treat Not Completed: Patient at procedure or test/ unavailable (EEG). Will follow up as time allows.  Gaye Alken M.S., OTR/L Pager: [phone]  06/25/2016, 10:23 AM

## 2016-06-26 DIAGNOSIS — I639 Cerebral infarction, unspecified: Secondary | ICD-10-CM | POA: Diagnosis not present

## 2016-06-26 DIAGNOSIS — E039 Hypothyroidism, unspecified: Secondary | ICD-10-CM | POA: Diagnosis not present

## 2016-06-26 DIAGNOSIS — R4701 Aphasia: Secondary | ICD-10-CM | POA: Diagnosis not present

## 2016-06-26 DIAGNOSIS — R410 Disorientation, unspecified: Secondary | ICD-10-CM | POA: Diagnosis not present

## 2016-06-26 LAB — GLUCOSE, CAPILLARY
GLUCOSE-CAPILLARY: 111 mg/dL — AB (ref 65–99)
GLUCOSE-CAPILLARY: 170 mg/dL — AB (ref 65–99)

## 2016-06-26 LAB — HEMOGLOBIN A1C
Hgb A1c MFr Bld: 5.8 % — ABNORMAL HIGH (ref 4.8–5.6)
Mean Plasma Glucose: 120 mg/dL

## 2016-06-26 MED ORDER — HYDROCODONE-ACETAMINOPHEN 5-325 MG PO TABS: ORAL_TABLET | ORAL | 0 refills | Status: DC

## 2016-06-26 MED ORDER — DILTIAZEM HCL ER COATED BEADS 120 MG PO CP24
120.0000 mg | ORAL_CAPSULE | Freq: Every day | ORAL | Status: DC
  Administered 2016-06-26: 120 mg via ORAL
  Filled 2016-06-26: qty 1

## 2016-06-26 MED ORDER — ALPRAZOLAM 0.25 MG PO TABS
0.2500 mg | ORAL_TABLET | Freq: Two times a day (BID) | ORAL | 0 refills | Status: DC
Start: 2016-06-26 — End: 2016-09-07

## 2016-06-26 NOTE — NC FL2 (Addendum)
[place] MEDICAID FL2 LEVEL OF CARE SCREENING TOOL     IDENTIFICATION  Patient Name: Ebony [name] Birthdate: 27-Nov-1931 Sex: female Admission Date (Current Location): 06/24/2016  [facility] and [place] Number:  Producer, television/film/video and Address:  [facility], [address] [place], [place] [address]      Provider Number: [id number]  Attending Physician Name and Address:  Maxie Barb, MD  Relative Name and Phone Number:       Current Level of Care: Hospital Recommended Level of Care: Assisted Living Facility Prior Approval Number:    Date Approved/Denied:   PASRR Number: [id number]  Discharge Plan: ALF    Current Diagnoses: Patient Active Problem List   Diagnosis Date Noted  . Acute encephalopathy   . Expressive aphasia   . Ischemic stroke (HCC) 06/24/2016  . Depression with anxiety 06/24/2016  . Hypothyroid 06/24/2016  . GERD (gastroesophageal reflux disease) 06/24/2016  . Allergic rhinitis 06/08/2014  . DDD (degenerative disc disease), lumbar 06/08/2014  . Insomnia 12/07/2013  . Depression   . OA (osteoarthritis) 01/06/2013  . Loss of weight 07/13/2012  . Vascular dementia without behavioral disturbance 06/20/2012  . Hypothyroidism due to acquired atrophy of thyroid 04/15/2012  . Atrial fibrillation with rapid ventricular response (HCC) 04/12/2012  . Confusion 04/10/2012  . Essential hypertension 05/16/2011  . Anxiety disorder 05/16/2011    Orientation RESPIRATION BLADDER Height & Weight     Place, Self  Normal Continent Weight: 161 lb 6 oz (73.2 kg) Height:   (160 cm)  BEHAVIORAL SYMPTOMS/MOOD NEUROLOGICAL BOWEL NUTRITION STATUS      Continent Heart healthy; NAS  AMBULATORY STATUS COMMUNICATION OF NEEDS Skin   Limited Assist Verbally Normal                       Personal Care Assistance Level of Assistance  Bathing, Feeding, Dressing Bathing Assistance: Limited assistance Feeding assistance:  Independent Dressing Assistance: Limited assistance     Functional Limitations Info  Sight, Hearing, Speech Sight Info: Adequate Hearing Info: Adequate Speech Info: Adequate    SPECIAL CARE FACTORS FREQUENCY        PT Frequency: none recommended OT Frequency: non recommended            Contractures Contractures Info: Not present    Additional Factors Info  Code Status, Allergies Code Status Info: DNR Allergies Info: Sulfa Antibiotics, Ace Inhibitors, Biaxin Clarithromycin, Penicillins, Cozaar Losartan Potassium           Current Medications (06/26/2016):  This is the current hospital active medication list Current Facility-Administered Medications  Medication Dose Route Frequency Provider Last Rate Last Dose  . 0.45 % sodium chloride infusion   Intravenous Continuous Ozella Rocks, MD 50 mL/hr at 06/24/16 1723    . ALPRAZolam Prudy Feeler) tablet 0.25 mg  0.25 mg Oral BID PRN Ozella Rocks, MD      . aspirin suppository 300 mg  300 mg Rectal Daily Ozella Rocks, MD   300 mg at 06/24/16 1719   Or  . aspirin tablet 325 mg  325 mg Oral Daily Ozella Rocks, MD   325 mg at 06/26/16 1010  . DULoxetine (CYMBALTA) DR capsule 40 mg  40 mg Oral Daily Ozella Rocks, MD   40 mg at 06/26/16 1010  . heparin injection 5,000 Units  5,000 Units Subcutaneous Q8H Ozella Rocks, MD   5,000 Units at 06/26/16 0544  . hydrALAZINE (APRESOLINE) injection  5-10 mg  5-10 mg Intravenous Q4H PRN Ozella Rocks, MD      . HYDROcodone-acetaminophen (NORCO/VICODIN) 5-325 MG per tablet 1 tablet  1 tablet Oral Q6H PRN Ozella Rocks, MD      . levothyroxine (SYNTHROID, LEVOTHROID) tablet 25 mcg  25 mcg Oral QHS Ozella Rocks, MD   25 mcg at 06/25/16 2256  . metoprolol succinate (TOPROL-XL) 24 hr tablet 100 mg  100 mg Oral Daily Dron Jaynie Collins, MD   100 mg at 06/26/16 1010  . ondansetron (ZOFRAN) tablet 4 mg  4 mg Oral Q6H PRN Ozella Rocks, MD       Or  . ondansetron [facility]) injection  4 mg  4 mg Intravenous Q6H PRN Ozella Rocks, MD      . pantoprazole (PROTONIX) EC tablet 20 mg  20 mg Oral Daily Ozella Rocks, MD   20 mg at 06/26/16 1000  . psyllium (HYDROCIL/METAMUCIL) packet 1 packet  1 packet Oral QHS Ozella Rocks, MD   1 packet at 06/25/16 2257     Discharge Medications: Please see discharge summary for a list of discharge medications.  Relevant Imaging Results:  Relevant Lab Results:   Additional Information SSN: [id number]  Maree Krabbe, LCSW

## 2016-06-26 NOTE — Care Management Note (Signed)
Case Management Note  Patient Details  Name: Ebony [name] MRN: [id number] Date of Birth: Jun 10, 1931  Subjective/Objective:                    Action/Plan: Pt discharging back to [facility] today. No further needs per CM.   Expected Discharge Date:  06/26/16               Expected Discharge Plan:  Assisted Living / Rest Home  In-House Referral:  Clinical Social Work  Discharge planning Services     Post Acute Care Choice:    Choice offered to:     DME Arranged:    DME Agency:     HH Arranged:    HH Agency:     Status of Service:  Completed, signed off  If discussed at [employer], dates discussed:    Additional Comments:  Kermit Balo, RN 06/26/2016, 2:11 PM

## 2016-06-26 NOTE — Discharge Summary (Signed)
Physician Discharge Summary  Ebony [name]:[id number] DOB: June 20, 1931 DOA: 06/24/2016  PCP: Florentina Jenny, MD  Admit date: 06/24/2016 Discharge date: 06/26/2016  Admitted From:SNF Disposition:SNF  Recommendations for Outpatient Follow-up:  1. Follow up with PCP in 1-2 weeks 2. Please obtain BMP/CBC in one week  Home Health:SNF Equipment/Devices:no Discharge Condition:stable CODE STATUS:DNR Diet recommendation:heart healthy  Brief/Interim Summary: 83 y.o.femalewith medical history significant of CVA, depression, hiatal hernia/GERD, hyperlipidemia, hypertension, subdural hematoma, TBI, hypothyroidism, presenting with aphasia and confusion.  # Aphasia likely delirium vs TIA : MRI and MRA of the brain with no acute stroke, consistent with a chronic finding. Patient's symptoms completely improved. As per patient's daughter at bedside, patient's mental status is around baseline. Evaluated by neurologist, MRI of the brain and EEG with no acute finding. Recommended no further intervention. Evaluated by PT OT. -Echocardiogram with bilateral systolic function, no wall motion abnormalities -EEG with no seizure -UA with no UTI, chest x-ray with no pneumonia. Patient has no sign of infection -LDL 105, A1c 5.8 -Continue aspirin. -PT, OT and social worker evaluated the patient and being discharged to skilled nursing facility in stable condition.  # Hypertension; resume home medications. Recommended to monitor heart rate and blood pressure home.  #Hypothyroidism: Continue Synthroid. TSH level acceptable.  #Acid reflux: Continue PPI.  Patient is clinically improved. Her confusion and his speech is also improved. Evaluated by neurologist. Workup has been essentially negative. Patient has no acute stroke. Patient may have underlying dementia, unknown type at this time. Discharge to skilled facility in stable condition with outpatient follow-up with PCP.  Discharge Diagnoses:  Active  Problems: Delirium    Depression with anxiety   Hypothyroid   GERD (gastroesophageal reflux disease)   Expressive aphasia    Discharge Instructions  Discharge Instructions    Call MD for:  difficulty breathing, headache or visual disturbances    Complete by:  As directed    Call MD for:  extreme fatigue    Complete by:  As directed    Call MD for:  hives    Complete by:  As directed    Call MD for:  persistant dizziness or light-headedness    Complete by:  As directed    Call MD for:  persistant nausea and vomiting    Complete by:  As directed    Call MD for:  severe uncontrolled pain    Complete by:  As directed    Call MD for:  temperature >100.4    Complete by:  As directed    Diet - low sodium heart healthy    Complete by:  As directed    Increase activity slowly    Complete by:  As directed      Allergies as of 06/26/2016      Reactions   Sulfa Antibiotics Diarrhea   Ace Inhibitors Other (See Comments)   unspecified   Biaxin [clarithromycin] Diarrhea   Penicillins Diarrhea   Cozaar [losartan Potassium] Rash      Medication List    TAKE these medications   acetaminophen 325 MG tablet Commonly known as:  TYLENOL Take 2 tablets (650 mg total) by mouth every 6 (six) hours as needed. What changed:  reasons to take this   ALPRAZolam 0.25 MG tablet Commonly known as:  XANAX Take 1 tablet (0.25 mg total) by mouth 2 (two) times daily.   aspirin 81 MG chewable tablet Chew 81 mg by mouth daily.   BIOFREEZE 4 % Gel Generic drug:  Menthol (  Topical Analgesic) Apply 1 application topically every 8 (eight) hours as needed (knee pain).   diltiazem 120 MG 24 hr capsule Commonly known as:  CARDIZEM CD Take 120 mg by mouth daily. for angina.   DULoxetine 60 MG capsule Commonly known as:  CYMBALTA Take 40 mg by mouth daily.   fluticasone 50 MCG/ACT nasal spray Commonly known as:  FLONASE Place 2 sprays into both nostrils daily.   HYDROcodone-acetaminophen 5-325  MG tablet Commonly known as:  NORCO/VICODIN Take one tablet by mouth every 8 hours as needed for pain   ibuprofen 200 MG tablet Commonly known as:  ADVIL,MOTRIN Take 200 mg by mouth every 12 (twelve) hours as needed for moderate pain.   levothyroxine 25 MCG tablet Commonly known as:  SYNTHROID, LEVOTHROID Take 1 tablet (25 mcg total) by mouth daily before breakfast. What changed:  when to take this   lisinopril 5 MG tablet Commonly known as:  PRINIVIL,ZESTRIL Take 7.5 mg by mouth daily.   Melatonin 3 MG Caps By mouth qhs for sleep   metoprolol succinate 100 MG 24 hr tablet Commonly known as:  TOPROL-XL Take 100 mg by mouth every morning. for HTN.   multivitamin with minerals Tabs tablet Take 1 tablet by mouth every morning.   ondansetron 4 MG tablet Commonly known as:  ZOFRAN Take 4 mg by mouth every 6 (six) hours as needed for nausea or vomiting.   pantoprazole 20 MG tablet Commonly known as:  PROTONIX Take 20 mg by mouth daily.   psyllium 0.52 g capsule Commonly known as:  REGULOID Take 0.52 g by mouth at bedtime.      Follow-up Information    Florentina Jenny, MD. Schedule an appointment as soon as possible for a visit in 1 week(s).   Specialty:  Family Medicine Contact information: [address] [place] [address] [phone]          Allergies  Allergen Reactions  . Sulfa Antibiotics Diarrhea  . Ace Inhibitors Other (See Comments)    unspecified  . Biaxin [Clarithromycin] Diarrhea  . Penicillins Diarrhea  . Cozaar [Losartan Potassium] Rash    Consultations: Neurologist  Procedures/Studies: None  Subjective: Patient was seen and examined at bedside. She was alert awake and mental status seems improving. Close to her baseline as per patient's daughter at bedside. Denied headache, dizziness, nausea, vomiting, chest pain or shortness of breath.  Discharge Exam: Vitals:   06/26/16 0444 06/26/16 0950  BP: (!) 155/97 (!) 146/86   Pulse: 77   Resp: 20 20  Temp: 98.3 F (36.8 C) 97.8 F (36.6 C)   Vitals:   06/25/16 2044 06/26/16 0013 06/26/16 0444 06/26/16 0950  BP: (!) 168/82 (!) 166/99 (!) 155/97 (!) 146/86  Pulse: 68 79 77   Resp: Temp: 98.5 F (36.9 C) 98.1 F (36.7 C) 98.3 F (36.8 C) 97.8 F (36.6 C)  TempSrc: Oral Oral Oral Oral  SpO2: 92% 94% 92% 94%  Weight:      Height:        General: Pt is alert, awake, not in acute distress Cardiovascular: RRR, S1/S2 +, no rubs, no gallops Respiratory: CTA bilaterally, no wheezing, no rhonchi Abdominal: Soft, NT, ND, bowel sounds + Extremities: no edema, no cyanosis    The results of significant diagnostics from this hospitalization (including imaging, microbiology, ancillary and laboratory) are listed below for reference.     Microbiology: No results found for this or any previous visit (from  the past 240 hour(s)).   Labs: BNP (last 3 results) No results for input(s): BNP in the last 8760 hours. Basic Metabolic Panel:  Recent Labs Lab 06/24/16 1228 06/24/16 1237 06/25/16 0805  NA 136 136 139  K 3.9 4.3 3.7  CL 100* 100* 103  CO2 25  --  26  GLUCOSE 85 95 100*  BUN CREATININE 0.94 0.90 0.81  CALCIUM 9.3  --  8.8*   Liver Function Tests:  Recent Labs Lab 06/24/16 1228  AST 27  ALT 18  ALKPHOS 82  BILITOT 0.5  PROT 7.0  ALBUMIN 3.9   No results for input(s): LIPASE, AMYLASE in the last 168 hours. No results for input(s): AMMONIA in the last 168 hours. CBC:  Recent Labs Lab 06/24/16 1228 06/24/16 1237 06/25/16 0805  WBC 9.8  --  7.7  NEUTROABS 6.1  --   --   HGB 11.9* 13.3 12.0  HCT 37.6 39.0 38.1  MCV 91.5  --  92.0  PLT 219  --  223   Cardiac Enzymes: No results for input(s): CKTOTAL, CKMB, CKMBINDEX, TROPONINI in the last 168 hours. BNP: Invalid input(s): POCBNP CBG:  Recent Labs Lab 06/25/16 1630 06/25/16 1945 06/25/16 2314 06/26/16 0348 06/26/16 1124  GLUCAP 105* 136* 96 111*  170*   D-Dimer No results for input(s): DDIMER in the last 72 hours. Hgb A1c  Recent Labs  06/25/16 0805  HGBA1C 5.8*   Lipid Profile  Recent Labs  06/25/16 0805  CHOL 189  HDL 46  LDLCALC 105*  TRIG 189*  CHOLHDL 4.1   Thyroid function studies  Recent Labs  06/25/16 1528  TSH 0.938   Anemia work up No results for input(s): VITAMINB12, FOLATE, FERRITIN, TIBC, IRON, RETICCTPCT in the last 72 hours. Urinalysis    Component Value Date/Time   COLORURINE YELLOW 06/24/2016 1413   APPEARANCEUR CLEAR 06/24/2016 1413   LABSPEC 1.024 06/24/2016 1413   PHURINE 6.0 06/24/2016 1413   GLUCOSEU NEGATIVE 06/24/2016 1413   HGBUR NEGATIVE 06/24/2016 1413   BILIRUBINUR NEGATIVE 06/24/2016 1413   KETONESUR NEGATIVE 06/24/2016 1413   PROTEINUR NEGATIVE 06/24/2016 1413   UROBILINOGEN 1.0 04/12/2012 1451   NITRITE NEGATIVE 06/24/2016 1413   LEUKOCYTESUR NEGATIVE 06/24/2016 1413   Sepsis Labs Invalid input(s): PROCALCITONIN,  WBC,  LACTICIDVEN Microbiology No results found for this or any previous visit (from the past 240 hour(s)).   Time coordinating discharge: 27 minutes  SIGNED:   Maxie Barb, MD  [facility] 06/26/2016, 11:59 AM  If 7PM-7AM, please contact night-coverage [email or web address] Password [email or web address]

## 2016-06-26 NOTE — Clinical Social Work Note (Signed)
CSW has sent [facility] the [facility] and discharge summary---no other CSW needs at this time. Pt's daughter will transport pt. Facility is prepared. RN to call report to [phone]. Clinical Social Worker will sign off for now as social work intervention is no longer needed. Please consult [place] again if new need arises.   Clarisse Gouge A Blaine Guiffre 06/26/2016

## 2016-06-26 NOTE — Evaluation (Addendum)
Occupational Therapy Evaluation and Discharge Patient Details Name: Ebony [name] MRN: [id number] DOB: [birth year] Today's Date: 06/26/2016    History of Present Illness 83 y.o. female from independent living presenting as a code stroke secondary to generalized malaise and intermittent expressive aphasia. MRI and CT of head  showed no acute stroke. Patient continues to have intermittent expressive aphasia with word finding difficulties. Per chart she has a history of SAH and CVA, but no residual deficits.     Clinical Impression   Patient presents close to functional baseline. Very minor confusion with short term memory, though daughter reports pt will have family assist initially at ALF.  Currently S level in room and was mod I previously.  Feel she will return to baseline with familiar surroundings and return to routine.  Therefore no current follow up recommendations as pt very active in exercises, craft classes, theatre outing classes at [facility].  As planned d/c today OT will not follow acutely - daughter in agreement.    Follow Up Recommendations  No OT follow up;Supervision/Assistance - 24 hour    Equipment Recommendations  None recommended by OT    Recommendations for Other Services       Precautions / Restrictions Precautions Precautions: Fall Precaution Comments: most recent fall month ago Restrictions Weight Bearing Restrictions: No      Mobility Bed Mobility Overal bed mobility: Needs Assistance Bed Mobility: Supine to Sit     Supine to sit: Min assist     General bed mobility comments: Pt sitting on toilet when OT entered and left in chair  Transfers Overall transfer level: Needs assistance Equipment used: Rolling walker (2 wheeled) Transfers: Sit to/from Stand Sit to Stand: Supervision         General transfer comment: increased time required    Balance Overall balance assessment: Needs assistance   Sitting balance-Leahy Scale: Good        Standing balance-Leahy Scale: Fair Standing balance comment: able to perform sink level ADL leaning against sink                           ADL either performed or assessed with clinical judgement   ADL Overall ADL's : At baseline                                       General ADL Comments: Pt able to perform toilet transfer front and rear peri care, oral care and wash face at sink. Pt then able to dress independently.     Vision Baseline Vision/History: Wears glasses Wears Glasses: Reading only Patient Visual Report: No change from baseline Vision Assessment?: No apparent visual deficits     Perception     Praxis      Pertinent Vitals/Pain Pain Assessment: No/denies pain     Hand Dominance Right   Extremity/Trunk Assessment Upper Extremity Assessment Upper Extremity Assessment: Generalized weakness   Lower Extremity Assessment Lower Extremity Assessment: Defer to PT evaluation LLE Deficits / Details: old bruising on L knee from fall several weeks ago   Cervical / Trunk Assessment Cervical / Trunk Assessment:  (rounded and forward head)   Communication Communication Communication: No difficulties   Cognition Arousal/Alertness: Awake/alert Behavior During Therapy: WFL for tasks assessed/performed Overall Cognitive Status: Impaired/Different from baseline Area of Impairment: Orientation;Memory  Orientation Level: Place;Time;Situation   Memory: Decreased short-term memory         General Comments: Pt with decreased    General Comments  Daughter in room throughout session    Exercises     Shoulder Instructions      Home Living Family/patient expects to be discharged to:: Assisted living Living Arrangements: Alone (daughters plan on staying with her at first)   Type of Home: Assisted living [facility])       Home Layout: One level         Bathroom Toilet: Handicapped height     Home  Equipment: Environmental consultant - 4 wheels;Shower seat;Grab bars - tub/shower          Prior Functioning/Environment Level of Independence: Independent with assistive device(s)                 OT Problem List: Impaired balance (sitting and/or standing);Decreased cognition      OT Treatment/Interventions:      OT Goals(Current goals can be found in the care plan section) Acute Rehab OT Goals Patient Stated Goal: to get back to her apt OT Goal Formulation: With patient/family Time For Goal Achievement: 07/03/16 Potential to Achieve Goals: Good  OT Frequency:     Barriers to D/C:            Co-evaluation              End of Session Nurse Communication: Mobility status;Other (comment) (Pt fully dressed)  Activity Tolerance: Patient tolerated treatment well Patient left: in chair;with call bell/phone within reach;with family/visitor present  OT Visit Diagnosis: Unsteadiness on feet (R26.81);Other symptoms and signs involving cognitive function                Time: 9604-5409 OT Time Calculation (min): 30 min Charges:  OT General Charges $OT Visit: 1 Procedure OT Evaluation $OT Eval Moderate Complexity: 1 Procedure OT Treatments $Self Care/Home Management : 8-22 mins G-Codes:      Jun 30, 2016 1100  OT G-codes **NOT FOR INPATIENT CLASS**  Functional Assessment Tool Used AM-PAC 6 Clicks Daily Activity  Functional Limitation Self care  Self Care Current Status (W1191) CI  Self Care Goal Status (Y7829) CI  Self Care Discharge Status [phone]) CI   Sherryl Manges OTR/L [phone]  Evern Bio Brindle Leyba 06-30-16, 11:33 AM   Addendum: Late G codes Sherryl Manges OTR/L [phone]

## 2016-06-26 NOTE — Progress Notes (Signed)
Pt discharged at this time to Assisted Living Facility with daughter taking all personal belongings. IV's discontinued, dry dressing applied. Discharge instructions provided along with prescriptions with verbal understanding. No noted distress.

## 2016-06-26 NOTE — Evaluation (Signed)
Physical Therapy Evaluation & Discharge Patient Details Name: Ebony [name] MRN: [id number] DOB: September 12, 1931 Today's Date: 06/26/2016   History of Present Illness  83 y.o. female from independent living presenting as a code stroke secondary to generalized malaise and intermittent expressive aphasia. MRI and CT of head  showed no acute stroke. Patient continues to have intermittent expressive aphasia with word finding difficulties. Per chart she has a history of SAH and CVA, but no residual deficits.    Clinical Impression  Patient presents close to functional baseline.  Remains somewhat confused, though daughter reports pt will have family assist initially at ALF.  Currently S level with RW and was mod I previously.  Feel she will return to baseline with familiar surroundings and return to routine.  Therefore no current follow up recommendations as pt very active in exercise classes at [facility].  As planned d/c today will not follow acutely.    Follow Up Recommendations No PT follow up    Equipment Recommendations  None recommended by PT    Recommendations for Other Services       Precautions / Restrictions Precautions Precautions: Fall Precaution Comments: most recent fall month ago      Mobility  Bed Mobility Overal bed mobility: Needs Assistance Bed Mobility: Supine to Sit     Supine to sit: Min assist     General bed mobility comments: able to come upright and bring legs off bed, but assist to scoot forward out of "hole" in the bed  Transfers Overall transfer level: Needs assistance Equipment used: Rolling walker (2 wheeled) Transfers: Sit to/from Stand Sit to Stand: Supervision         General transfer comment: increased time, more than one attempt, but no physical help needed  Ambulation/Gait Ambulation/Gait assistance: Supervision Ambulation Distance (Feet): 200 Feet Assistive device: Rolling walker (2 wheeled) Gait Pattern/deviations: Step-through  pattern;Decreased stride length;Trunk flexed     General Gait Details: cues for avoiding door facing in hallway, uses rollator at home so stops to complain about RW not going straight  Stairs            Wheelchair Mobility    Modified Rankin (Stroke Patients Only) Modified Rankin (Stroke Patients Only) Pre-Morbid Rankin Score: Moderate disability Modified Rankin: Moderately severe disability     Balance Overall balance assessment: Needs assistance   Sitting balance-Leahy Scale: Good       Standing balance-Leahy Scale: Fair Standing balance comment: can stand unsupporte, but walker for ambulation                             Pertinent Vitals/Pain Pain Assessment: No/denies pain    Home Living Family/patient expects to be discharged to:: Assisted living Living Arrangements: Alone   Type of Home: Assisted living [facility])       Home Layout: One level Home Equipment: Environmental consultant - 4 wheels;Shower seat;Grab bars - tub/shower      Prior Function Level of Independence: Independent with assistive device(s)               Hand Dominance   Dominant Hand: Right    Extremity/Trunk Assessment   Upper Extremity Assessment Upper Extremity Assessment: Generalized weakness    Lower Extremity Assessment Lower Extremity Assessment: Overall WFL for tasks assessed;LLE deficits/detail LLE Deficits / Details: old bruising on L knee from fall several weeks ago       Communication   Communication: No difficulties  Cognition Arousal/Alertness:  Awake/alert Behavior During Therapy: WFL for tasks assessed/performed Overall Cognitive Status: Impaired/Different from baseline Area of Impairment: Orientation;Memory                 Orientation Level: Place;Time;Situation   Memory: Decreased short-term memory         General Comments: oriented to time of admission describing when sitting at a meal with other residents at her facility, but did  not recall reason for admission, possibly word finding issues due to stated school, but seems to know hospital as 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 [employer]; daughter reports little fuzzy this morning from baseline      General Comments General comments (skin integrity, edema, etc.): daughter in room throughout    Exercises     Assessment/Plan    PT Assessment Patent does not need any further PT services  PT Problem List         PT Treatment Interventions      PT Goals (Current goals can be found in the Care Plan section)  Acute Rehab PT Goals PT Goal Formulation: All assessment and education complete, DC therapy    Frequency     Barriers to discharge        Co-evaluation               End of Session Equipment Utilized During Treatment: Gait belt Activity Tolerance: Patient tolerated treatment well Patient left: in chair;with call bell/phone within reach;with family/visitor present   PT Visit Diagnosis: Other abnormalities of gait and mobility (R26.89);History of falling (Z91.81)    Time: 6606-3016 PT Time Calculation (min) (ACUTE ONLY): 33 min   Charges:   PT Evaluation $PT Eval Moderate Complexity: 1 Procedure PT Treatments $Gait Training: 8-22 mins   PT G Codes:   PT G-Codes **NOT FOR INPATIENT CLASS** Functional Assessment Tool Used: AM-PAC 6 Clicks Basic Mobility;Clinical judgement Functional Limitation: Mobility: Walking and moving around Mobility: Walking and Moving Around Current Status (W1093): At least 20 percent but less than 40 percent impaired, limited or restricted Mobility: Walking and Moving Around Goal Status [phone]): At least 20 percent but less than 40 percent impaired, limited or restricted Mobility: Walking and Moving Around Discharge Status [phone]): At least 20 percent but less than 40 percent impaired, limited or restricted    [place], [place] [phone] 06/26/2016   Elray Mcgregor 06/26/2016, 10:21 AM

## 2016-06-26 NOTE — Clinical Social Work Note (Signed)
Pt is from [facility]. CSW spoke with pt's daughter plan is for pt to return. CSW spoke with [facility] and they are ready for the pt. Pt's daughter states they will transport pt to [facility] at d/c.   [place], [place] [phone]

## 2016-06-26 NOTE — Progress Notes (Addendum)
Subjective: Doing much better today speech continues to improve. Family is in room and states that her speech is back to baseline.  Exam: Vitals:   06/26/16 0444 06/26/16 0950  BP: (!) 155/97 (!) 146/86  Pulse: 77   Resp: 20 20  Temp: 98.3 F (36.8 C) 97.8 F (36.6 C)    HEENT-  Normocephalic, no lesions, without obvious abnormality.  Normal external eye and conjunctiva.  Normal TM's bilaterally.  Normal auditory canals and external ears. Normal external nose, mucus membranes and septum.  Normal pharynx.   Neuro:  CN: Pupils are equal and round. They are symmetrically reactive from 3-->2 mm. EOMI without nystagmus. Facial sensation is intact to light touch. Face is symmetric at rest with normal strength and mobility. Hearing is intact to conversational voice. Palate elevates symmetrically and uvula is midline. Voice is normal in tone, pitch and quality. Bilateral SCM and trapezii are 5/5. Tongue is midline with normal bulk and mobility.  Motor: Normal bulk, tone, and strength. 5/5 throughout. No drift.  Sensation: Intact to light touch.      Pertinent Labs/Diagnostics: EEG:  This EEG demonstrated no focal, hemispheric, or lateralizing features.  There was a mild degree of slowing of electrocerebral activity which can be seen in a wide variety of encephalopathic states including those of extreme drowsiness, toxic, metabolic, or degenerative nature.  There was no epileptiform activity recorded on today's tracing.    MRI/MRA brain:  IMPRESSION: 1. No acute cranial abnormality. 2. Advanced atrophy and white matter disease. This likely reflects the sequela of chronic microvascular ischemia. 3. Normal variant MRA circle of Willis without significant proximal stenosis, aneurysm, or branch vessel occlusion.     Impression: Impression: [name] with altered mental status. At this point with negative MRI/EEG, I think that his may be a mild delirium, though cause is not currently clear.     Recommendations: No further recommendations. Neurology S/O  Felicie Morn PA-C [facility] [phone]  06/26/2016, 11:19 AM   I have seen the patient reviewed the above note. I suspect that she did have mild delirium, unclear etiology but with improving symptoms I don't think that further evaluation is likely to be of use. With negative MRI and persistent symptoms for as long as she had them, I don't think that vascular cause is likely. If she were to have further episodes in the future, then further evaluation may be needed of the time.  Ritta Slot, MD [facility] [phone]  If 7pm- 7am, please page neurology on call as listed in AMION.

## 2016-06-30 DIAGNOSIS — R4701 Aphasia: Secondary | ICD-10-CM | POA: Diagnosis not present

## 2016-06-30 DIAGNOSIS — R69 Illness, unspecified: Secondary | ICD-10-CM | POA: Diagnosis not present

## 2016-06-30 DIAGNOSIS — I4891 Unspecified atrial fibrillation: Secondary | ICD-10-CM | POA: Diagnosis not present

## 2016-06-30 DIAGNOSIS — R6889 Other general symptoms and signs: Secondary | ICD-10-CM | POA: Diagnosis not present

## 2016-06-30 DIAGNOSIS — I10 Essential (primary) hypertension: Secondary | ICD-10-CM | POA: Diagnosis not present

## 2016-06-30 DIAGNOSIS — R7989 Other specified abnormal findings of blood chemistry: Secondary | ICD-10-CM | POA: Diagnosis not present

## 2016-06-30 DIAGNOSIS — K625 Hemorrhage of anus and rectum: Secondary | ICD-10-CM | POA: Diagnosis not present

## 2016-06-30 DIAGNOSIS — R109 Unspecified abdominal pain: Secondary | ICD-10-CM | POA: Diagnosis not present

## 2016-07-07 NOTE — Progress Notes (Signed)
SLP Note (late entry):    06/25/16 0930  SLP G-Codes **NOT FOR INPATIENT CLASS**  Functional Assessment Tool Used skilled clinical judgment  Functional Limitations Swallowing  Swallow Current Status (Z6109(G8996) [facility]  Swallow [facility]al [facility] (U045[facility]G8997) [facility]  Swallow Discharge Status (U9811(G8998) CH  SLP Evaluations  $ SLP Speech Visit 1 Procedure  SLP Evaluations  $BSS Swallow 1 Procedure    Note entered for [facility] CenterBonnie DeBlois, MA CCC-SLP  Maxcine HamLaura Paiewonsky, M.A. CCC-SLP [phone]

## 2016-07-07 NOTE — Progress Notes (Signed)
SLP Note (late entry):    06/25/16 0900  SLP G-Codes **NOT FOR INPATIENT CLASS**  Functional Assessment Tool Used skilled clinical judgment  Functional Limitations Spoken language expressive  Spoken Language Expression Current Status (W0981(G9162) CK  Spoken Language Expression Goal Status (X9147(G9163) CJ  SLP Evaluations  $ SLP Speech Visit 1 Procedure  SLP Evaluations  $ SLP EVAL LANGUAGE/SOUND PRODUCTION 1 Procedure   Note entered for [facility] HealthBonnie DeBlois, MA CCC-SLP  Maxcine HamLaura Paiewonsky, M.A. CCC-SLP [phone]

## 2016-07-20 DIAGNOSIS — N390 Urinary tract infection, site not specified: Secondary | ICD-10-CM | POA: Diagnosis not present

## 2016-07-21 DIAGNOSIS — R269 Unspecified abnormalities of gait and mobility: Secondary | ICD-10-CM | POA: Diagnosis not present

## 2016-07-21 DIAGNOSIS — K5900 Constipation, unspecified: Secondary | ICD-10-CM | POA: Diagnosis not present

## 2016-07-21 DIAGNOSIS — K649 Unspecified hemorrhoids: Secondary | ICD-10-CM | POA: Diagnosis not present

## 2016-07-21 DIAGNOSIS — R69 Illness, unspecified: Secondary | ICD-10-CM | POA: Diagnosis not present

## 2016-07-21 DIAGNOSIS — I10 Essential (primary) hypertension: Secondary | ICD-10-CM | POA: Diagnosis not present

## 2016-07-21 DIAGNOSIS — E039 Hypothyroidism, unspecified: Secondary | ICD-10-CM | POA: Diagnosis not present

## 2016-07-28 DIAGNOSIS — N390 Urinary tract infection, site not specified: Secondary | ICD-10-CM | POA: Diagnosis not present

## 2016-07-28 DIAGNOSIS — R69 Illness, unspecified: Secondary | ICD-10-CM | POA: Diagnosis not present

## 2016-07-28 DIAGNOSIS — R269 Unspecified abnormalities of gait and mobility: Secondary | ICD-10-CM | POA: Diagnosis not present

## 2016-07-28 DIAGNOSIS — K5901 Slow transit constipation: Secondary | ICD-10-CM | POA: Diagnosis not present

## 2016-07-28 DIAGNOSIS — I10 Essential (primary) hypertension: Secondary | ICD-10-CM | POA: Diagnosis not present

## 2016-07-28 DIAGNOSIS — R4701 Aphasia: Secondary | ICD-10-CM | POA: Diagnosis not present

## 2016-08-04 DIAGNOSIS — E039 Hypothyroidism, unspecified: Secondary | ICD-10-CM | POA: Diagnosis not present

## 2016-08-11 DIAGNOSIS — R69 Illness, unspecified: Secondary | ICD-10-CM | POA: Diagnosis not present

## 2016-08-11 DIAGNOSIS — K5901 Slow transit constipation: Secondary | ICD-10-CM | POA: Diagnosis not present

## 2016-08-11 DIAGNOSIS — I10 Essential (primary) hypertension: Secondary | ICD-10-CM | POA: Diagnosis not present

## 2016-08-11 DIAGNOSIS — R197 Diarrhea, unspecified: Secondary | ICD-10-CM | POA: Diagnosis not present

## 2016-08-18 DIAGNOSIS — R69 Illness, unspecified: Secondary | ICD-10-CM | POA: Diagnosis not present

## 2016-08-18 DIAGNOSIS — I10 Essential (primary) hypertension: Secondary | ICD-10-CM | POA: Diagnosis not present

## 2016-08-18 DIAGNOSIS — R197 Diarrhea, unspecified: Secondary | ICD-10-CM | POA: Diagnosis not present

## 2016-08-18 DIAGNOSIS — E039 Hypothyroidism, unspecified: Secondary | ICD-10-CM | POA: Diagnosis not present

## 2016-08-31 ENCOUNTER — Emergency Department (HOSPITAL_COMMUNITY)
Admission: EM | Admit: 2016-08-31 | Discharge: 2016-08-31 | Disposition: A | Discharge status: Home / Self Care | Payer: Medicare HMO | Attending: Emergency Medicine | Admitting: Emergency Medicine

## 2016-08-31 ENCOUNTER — Emergency Department (HOSPITAL_COMMUNITY): Payer: Medicare HMO

## 2016-08-31 ENCOUNTER — Encounter (HOSPITAL_COMMUNITY): Payer: Self-pay | Admitting: Emergency Medicine

## 2016-08-31 DIAGNOSIS — Y998 Other external cause status: Secondary | ICD-10-CM | POA: Insufficient documentation

## 2016-08-31 DIAGNOSIS — Y9389 Activity, other specified: Secondary | ICD-10-CM | POA: Insufficient documentation

## 2016-08-31 DIAGNOSIS — R29810 Facial weakness: Secondary | ICD-10-CM

## 2016-08-31 DIAGNOSIS — Z7982 Long term (current) use of aspirin: Secondary | ICD-10-CM | POA: Diagnosis not present

## 2016-08-31 DIAGNOSIS — Z8673 Personal history of transient ischemic attack (TIA), and cerebral infarction without residual deficits: Secondary | ICD-10-CM | POA: Insufficient documentation

## 2016-08-31 DIAGNOSIS — I10 Essential (primary) hypertension: Secondary | ICD-10-CM | POA: Insufficient documentation

## 2016-08-31 DIAGNOSIS — S0990XA Unspecified injury of head, initial encounter: Secondary | ICD-10-CM | POA: Diagnosis not present

## 2016-08-31 DIAGNOSIS — W01198A Fall on same level from slipping, tripping and stumbling with subsequent striking against other object, initial encounter: Secondary | ICD-10-CM | POA: Insufficient documentation

## 2016-08-31 DIAGNOSIS — S0083XA Contusion of other part of head, initial encounter: Secondary | ICD-10-CM | POA: Diagnosis not present

## 2016-08-31 DIAGNOSIS — Z79899 Other long term (current) drug therapy: Secondary | ICD-10-CM | POA: Insufficient documentation

## 2016-08-31 DIAGNOSIS — W19XXXA Unspecified fall, initial encounter: Secondary | ICD-10-CM

## 2016-08-31 DIAGNOSIS — Y92128 Other place in nursing home as the place of occurrence of the external cause: Secondary | ICD-10-CM | POA: Insufficient documentation

## 2016-08-31 DIAGNOSIS — E039 Hypothyroidism, unspecified: Secondary | ICD-10-CM | POA: Insufficient documentation

## 2016-08-31 DIAGNOSIS — I6789 Other cerebrovascular disease: Secondary | ICD-10-CM | POA: Diagnosis not present

## 2016-08-31 DIAGNOSIS — Z791 Long term (current) use of non-steroidal anti-inflammatories (NSAID): Secondary | ICD-10-CM | POA: Insufficient documentation

## 2016-08-31 LAB — URINALYSIS, ROUTINE W REFLEX MICROSCOPIC
Bilirubin Urine: NEGATIVE
Glucose, UA: NEGATIVE mg/dL
Hgb urine dipstick: NEGATIVE
KETONES UR: NEGATIVE mg/dL
LEUKOCYTES UA: NEGATIVE
NITRITE: NEGATIVE
Protein, ur: NEGATIVE mg/dL
SPECIFIC GRAVITY, URINE: 1.01 (ref 1.005–1.030)
pH: 7 (ref 5.0–8.0)

## 2016-08-31 LAB — COMPREHENSIVE METABOLIC PANEL
ALBUMIN: 3.7 g/dL (ref 3.5–5.0)
ALT: 17 U/L (ref 14–54)
AST: 27 U/L (ref 15–41)
Alkaline Phosphatase: 73 U/L (ref 38–126)
Anion gap: 9 (ref 5–15)
BUN: 11 mg/dL (ref 6–20)
CHLORIDE: 95 mmol/L — AB (ref 101–111)
CO2: 28 mmol/L (ref 22–32)
Calcium: 9.1 mg/dL (ref 8.9–10.3)
Creatinine, Ser: 0.76 mg/dL (ref 0.44–1.00)
GFR calc Af Amer: >60 mL/min (ref >60)
GFR calc non Af Amer: >60 mL/min (ref >60)
GLUCOSE: 112 mg/dL — AB (ref 65–99)
POTASSIUM: 4.5 mmol/L (ref 3.5–5.1)
Sodium: 132 mmol/L — ABNORMAL LOW (ref 135–145)
Total Bilirubin: 0.6 mg/dL (ref 0.3–1.2)
Total Protein: 6.3 g/dL — ABNORMAL LOW (ref 6.5–8.1)

## 2016-08-31 LAB — CBC WITH DIFFERENTIAL/PLATELET
BASOS ABS: 0 10*3/uL (ref 0.0–0.1)
BASOS PCT: 0 %
EOS ABS: 0.1 10*3/uL (ref 0.0–0.7)
EOS PCT: 1 %
HCT: 37.9 % (ref 36.0–46.0)
Hemoglobin: 12 g/dL (ref 12.0–15.0)
Lymphocytes Relative: 25 %
Lymphs Abs: 2.6 10*3/uL (ref 0.7–4.0)
MCH: 28.8 pg (ref 26.0–34.0)
MCHC: 31.7 g/dL (ref 30.0–36.0)
MCV: 91.1 fL (ref 78.0–100.0)
Monocytes Absolute: 0.7 10*3/uL (ref 0.1–1.0)
Monocytes Relative: 7 %
Neutro Abs: 7.1 10*3/uL (ref 1.7–7.7)
Neutrophils Relative %: 67 %
PLATELETS: 244 10*3/uL (ref 150–400)
RBC: 4.16 MIL/uL (ref 3.87–5.11)
RDW: 15 % (ref 11.5–15.5)
WBC: 10.5 10*3/uL (ref 4.0–10.5)

## 2016-08-31 LAB — I-STAT TROPONIN, ED: TROPONIN I, POC: 0.01 ng/mL (ref 0.00–0.08)

## 2016-08-31 NOTE — ED Notes (Signed)
Pt transported to MRI 

## 2016-08-31 NOTE — Discharge Instructions (Signed)
As we discussed, your MRI today was negative for new stroke or other cranial injuries. We recommend you continue your daily aspirin and follow-up with your primary care doctor. You can return here for any new or worsening symptoms.

## 2016-08-31 NOTE — ED Provider Notes (Signed)
[facility]-EMERGENCY DEPT Provider Note   CSN: [id number] Arrival date & time: 08/31/16  1533     History   Chief Complaint Chief Complaint  Patient presents with  . Facial Droop    HPI Ebony [name] is a 83 y.o. female.  The history is provided by the patient and medical records.    83 year old female with history of stroke, anxiety, depression, hyperlipidemia, hypertension, history of subarachnoid hemorrhage as well as subdural hematoma from falls, presenting to the ED with new left-sided facial droop. Patient from independent living facility, unknown last normal.  Reportedly from facility patient has had multiple falls recently, last was today.  Patient reports her walker has a habit of "rolling away from her" so she tries to catch up with it and ends up falling.  States today she was going to the elevator and a friend of hers had fallen.  States she walked to go check on her and walked slid away again and she fell, striking the left side of her face on the walker.  States she never lost consciousness. EMS initially come to get her friend, but they noticed the bruising and some left-sided facial droop and decided to bring her instead.  States she does have hx of stroke and mini strokes, takes ASA for this.  States she has noticed her left face has been "drooping" a little bit, unsure when she first noticed this.  Also reports her left arm seems a bit weaker than the right.  She denies any chest pain, SOB, headache, dizziness, confusion, or other symptoms at present.  Past Medical History:  Diagnosis Date  . Acute ischemic stroke (HCC) 04/11/2012  . Allergic rhinitis   . Anxiety   . Arthritis    Knees  . Depression   . Fibrocystic breast disease   . Hiatal hernia   . Hyperlipidemia   . Hypertension   . IFG (impaired fasting glucose)   . SDH (subdural hematoma) (HCC) 05/16/2011  . Subarachnoid hemorrhage following injury (HCC) 05/16/2011  . Subarachnoid hemorrhage following injury  (HCC) 05/16/2011  . Unspecified hypothyroidism 04/15/2012    Patient Active Problem List   Diagnosis Date Noted  . Acute encephalopathy   . Expressive aphasia   . Ischemic stroke (HCC) 06/24/2016  . Depression with anxiety 06/24/2016  . Hypothyroid 06/24/2016  . GERD (gastroesophageal reflux disease) 06/24/2016  . Allergic rhinitis 06/08/2014  . DDD (degenerative disc disease), lumbar 06/08/2014  . Insomnia 12/07/2013  . Depression   . OA (osteoarthritis) 01/06/2013  . Loss of weight 07/13/2012  . Vascular dementia without behavioral disturbance 06/20/2012  . Hypothyroidism due to acquired atrophy of thyroid 04/15/2012  . Atrial fibrillation with rapid ventricular response (HCC) 04/12/2012  . Confusion 04/10/2012  . Essential hypertension 05/16/2011  . Anxiety disorder 05/16/2011    Past Surgical History:  Procedure Laterality Date  . ABDOMINAL HYSTERECTOMY    . BREAST SURGERY    . EYE SURGERY    . TONSILLECTOMY      OB History    No data available       Home Medications    Prior to Admission medications   Medication Sig Start Date End Date Taking? Authorizing Provider  acetaminophen (TYLENOL) 325 MG tablet Take 2 tablets (650 mg total) by mouth every 6 (six) hours as needed. Patient taking differently: Take 650 mg by mouth every 6 (six) hours as needed for mild pain.  04/15/12   Christiane Ha, MD  ALPRAZolam Prudy Feeler) 0.25 MG tablet  Take 1 tablet (0.25 mg total) by mouth 2 (two) times daily. 06/26/16   Maxie Barb, MD  aspirin 81 MG chewable tablet Chew 81 mg by mouth daily.    [provider]  diltiazem (CARDIZEM CD) 120 MG 24 hr capsule Take 120 mg by mouth daily. for angina.    [provider]  DULoxetine (CYMBALTA) 60 MG capsule Take 40 mg by mouth daily.     [provider]  fluticasone (FLONASE) 50 MCG/ACT nasal spray Place 2 sprays into both nostrils daily. Patient not taking: Reported on 06/24/2016 06/08/14   Sharon Seller, NP  HYDROcodone-acetaminophen (NORCO/VICODIN) 5-325 MG tablet Take one tablet by mouth every 8 hours as needed for pain 06/26/16   Maxie Barb, MD  ibuprofen (ADVIL,MOTRIN) 200 MG tablet Take 200 mg by mouth every 12 (twelve) hours as needed for moderate pain.    [provider]  levothyroxine (SYNTHROID, LEVOTHROID) 25 MCG tablet Take 1 tablet (25 mcg total) by mouth daily before breakfast. Patient taking differently: Take 25 mcg by mouth at bedtime.  04/15/12   Christiane Ha, MD  lisinopril (PRINIVIL,ZESTRIL) 5 MG tablet Take 7.5 mg by mouth daily.    [provider]  Melatonin 3 MG CAPS By mouth qhs for sleep Patient not taking: Reported on 06/24/2016 11/17/13   Sharon Seller, NP  Menthol, Topical Analgesic, (BIOFREEZE) 4 % GEL Apply 1 application topically every 8 (eight) hours as needed (knee pain).    [provider]  metoprolol (TOPROL-XL) 100 MG 24 hr tablet Take 100 mg by mouth every morning. for HTN.    [provider]  Multiple Vitamin (MULITIVITAMIN WITH MINERALS) TABS Take 1 tablet by mouth every morning.    [provider]  ondansetron (ZOFRAN) 4 MG tablet Take 4 mg by mouth every 6 (six) hours as needed for nausea or vomiting.    [provider]  pantoprazole (PROTONIX) 20 MG tablet Take 20 mg by mouth daily.    [provider]  psyllium (REGULOID) 0.52 g capsule Take 0.52 g by mouth at bedtime.    [provider]    Family History Family History  Problem Relation Age of Onset  . Lung cancer Sister   . CVA Neg Hx   . Diabetes Neg Hx   . Hypertension Neg Hx     Social History Social History  Substance Use Topics  . Smoking status: Never Smoker  . Smokeless tobacco: Never Used  . Alcohol use No     Allergies   Sulfa antibiotics; Ace inhibitors; Biaxin [clarithromycin]; Penicillins; and Cozaar [losartan potassium]   Review of Systems Review of Systems  Neurological:  Positive for facial asymmetry.  All other systems reviewed and are negative.    Physical Exam Updated Vital Signs There were no vitals taken for this visit.  Physical Exam  Constitutional: She is oriented to person, place, and time. She appears well-developed and well-nourished.  HENT:  Head: Normocephalic and atraumatic.  Mouth/Throat: Oropharynx is clear and moist.  Bruising of left cheek and along left jaw line; there is no gross deformity, dentition appears intact, no trismus or malocclusion noted  Eyes: Conjunctivae and EOM are normal. Pupils are equal, round, and reactive to light.  Neck: Normal range of motion.  Cardiovascular: Normal rate, regular rhythm and normal heart sounds.   Pulmonary/Chest: Effort normal and breath sounds normal. No respiratory distress. She has no wheezes.  Abdominal: Soft. Bowel sounds are normal. There  is no tenderness. There is no rebound.  Musculoskeletal: Normal range of motion.  Neurological: She is alert and oriented to person, place, and time.  AAOx3, answering questions and following commands appropriately; thoughts are clear and concise, goal oriented; slight weakness of left arm compared with right; both legs are weak at baseline, CN grossly intact; no apparent dysmetria with finger to nose, trouble with heel to shin due to baseline leg weakness; speech is clear, no slurring or expressive aphasia noted, subtle left facial droop localized to left mouth but sparing forehead and eye; normal sensation throughout  Skin: Skin is warm and dry.  Psychiatric: She has a normal mood and affect.  Nursing note and vitals reviewed.    ED Treatments / Results  Labs (all labs ordered are listed, but only abnormal results are displayed) Labs Reviewed  COMPREHENSIVE METABOLIC PANEL - Abnormal; Notable for the following:       Result Value   Sodium 132 (*)    Chloride 95 (*)    Glucose, Bld 112 (*)    Total Protein 6.3 (*)    All other components within  normal limits  URINE CULTURE  CBC WITH DIFFERENTIAL/PLATELET  URINALYSIS, ROUTINE W REFLEX MICROSCOPIC  I-STAT TROPOININ, ED    EKG  EKG Interpretation  Date/Time:  Monday August 31 2016 17:04:04 EDT Ventricular Rate:  61 PR Interval:    QRS Duration: 81 QT Interval:  476 QTC Calculation: 480 R Axis:   43 Text Interpretation:  Sinus rhythm Borderline prolonged PR interval Minimal ST depression, diffuse leads Confirmed by Geoffery Lyons ([id number]) on 08/31/2016 5:06:58 PM       Radiology Ct Head Wo Contrast  Result Date: 08/31/2016 CLINICAL DATA:  Fall from recliner. Pt has bruising to left side of face. Pt seems confused. EXAM: CT HEAD WITHOUT CONTRAST CT MAXILLOFACIAL WITHOUT CONTRAST TECHNIQUE: Multidetector CT imaging of the head and maxillofacial structures were performed using the standard protocol without intravenous contrast. Multiplanar CT image reconstructions of the maxillofacial structures were also generated. COMPARISON:  Head CT 06/24/2016 FINDINGS: CT HEAD FINDINGS Brain: No intracranial hemorrhage. No parenchymal contusion. No midline shift or mass effect. Basilar cisterns are patent. No skull base fracture. No fluid in the paranasal sinuses or mastoid air cells. Orbits are normal. There are periventricular and subcortical white matter hypodensities. Generalized cortical atrophy. Vascular: No hyperdense vessel or unexpected calcification. Skull: Normal. Negative for fracture or focal lesion. Sinuses/Orbits: Paranasal sinuses and mastoid air cells are clear. Orbits are clear. Other: None. CT MAXILLOFACIAL FINDINGS Osseous: No orbital wall fracture. Maxillary sinus walls are intact. Pterygoid plates are normal. Zygomatic arches are intact. Mandibular condyles are located. Fragmentation of the mandibular condyle on the LEFT is chronic. No evidence of mandibular ramus fracture. Orbits: The globes are intact. Intraconal contents are clear. Ocular muscles appear normal. Sinuses: No fluid  Soft tissues: Normal IMPRESSION: 1. No intracranial trauma. 2. No facial bone fracture. 3. Atrophy and white matter microvascular disease again noted. Electronically Signed   By: Genevive Bi M.D.   On: 08/31/2016 17:06   Mr Brain Wo Contrast  Result Date: 08/31/2016 CLINICAL DATA:  83 year old hypertensive female post fall. Left-sided facial droop. Subsequent encounter. EXAM: MRI HEAD WITHOUT CONTRAST TECHNIQUE: Multiplanar, multiecho pulse sequences of the brain and surrounding structures were obtained without intravenous contrast. COMPARISON:  08/31/2016 CT.  06/24/2016 MR. FINDINGS: Brain: No acute infarct. Blood breakdown products left frontal lobe sulcus and punctate blood breakdown products left cerebellum unchanged from prior exam consistent with  prior episodes of hemorrhage. No evidence of acute hemorrhage. Remote anterior left frontal lobe infarct. Prominent chronic microvascular changes, confluent periventricular region. Moderate global atrophy without hydrocephalus. No intracranial mass lesion noted on this unenhanced exam. Vascular: Major intracranial vascular structures are patent. Skull and upper cervical spine: No acute abnormality. Sinuses/Orbits: No acute orbital abnormality. Minimal mucosal thickening ethmoid sinus air cells. Other: Negative. IMPRESSION: No acute infarct or acute intracranial hemorrhage. Blood breakdown products left frontal lobe sulcus and punctate blood breakdown products left cerebellum unchanged from prior exam consistent with prior episodes of hemorrhage. Remote anterior left frontal lobe infarct. Prominent chronic microvascular changes, confluent periventricular region. Moderate global atrophy. Electronically Signed   By: Lacy DuverneySteven  Olson M.D.   On: 08/31/2016 19:25   Ct Maxillofacial Wo Contrast  Result Date: 08/31/2016 CLINICAL DATA:  Fall from recliner. Pt has bruising to left side of face. Pt seems confused. EXAM: CT HEAD WITHOUT CONTRAST CT MAXILLOFACIAL WITHOUT  CONTRAST TECHNIQUE: Multidetector CT imaging of the head and maxillofacial structures were performed using the standard protocol without intravenous contrast. Multiplanar CT image reconstructions of the maxillofacial structures were also generated. COMPARISON:  Head CT 06/24/2016 FINDINGS: CT HEAD FINDINGS Brain: No intracranial hemorrhage. No parenchymal contusion. No midline shift or mass effect. Basilar cisterns are patent. No skull base fracture. No fluid in the paranasal sinuses or mastoid air cells. Orbits are normal. There are periventricular and subcortical white matter hypodensities. Generalized cortical atrophy. Vascular: No hyperdense vessel or unexpected calcification. Skull: Normal. Negative for fracture or focal lesion. Sinuses/Orbits: Paranasal sinuses and mastoid air cells are clear. Orbits are clear. Other: None. CT MAXILLOFACIAL FINDINGS Osseous: No orbital wall fracture. Maxillary sinus walls are intact. Pterygoid plates are normal. Zygomatic arches are intact. Mandibular condyles are located. Fragmentation of the mandibular condyle on the LEFT is chronic. No evidence of mandibular ramus fracture. Orbits: The globes are intact. Intraconal contents are clear. Ocular muscles appear normal. Sinuses: No fluid Soft tissues: Normal IMPRESSION: 1. No intracranial trauma. 2. No facial bone fracture. 3. Atrophy and white matter microvascular disease again noted. Electronically Signed   By: Genevive BiStewart  Edmunds M.D.   On: 08/31/2016 17:06    Procedures Procedures (including critical care time)  Medications Ordered in ED Medications - No data to display   Initial Impression / Assessment and Plan / ED Course  I have reviewed the triage vital signs and the nursing notes.  Pertinent labs & imaging results that were available during my care of the patient were reviewed by me and considered in my medical decision making (see chart for details).  83 year old female sent in from her independent living  facility with fall and new left-sided facial droop. Unsure last known well. Patient states she has noticed a sided droop near her mouth but thought this was from her teeth. This is subtle on my exam. Does have some bruising along left cheek and jaw from fall today.  No LOC reported.  She does have some mild weakness of the left hand grip as well, no other apparent deficits.  Legs are weak at baseline, this seems unchanged.  After talking with patient, it seems most of her falls are coming from her walker when it rolls away from her.  Family agrees with this, they did get her a new walker today.  Will proceed with CT head and face given the bruising, labs obtained.  CT head and max/face negative for acute findings.  Labs overall reassuring.  Patient does have hx of stroke.  Given left sided facial droop, will obtain MRI.  MRI negative for acute stroke, old changes noted.  Given her negative evaluation here and unchanged symptoms from arrival, unsure of the utility of hospital admission (if any) at this point.  Will discuss with neurology for recommendations.  8:00 PM  Discussed with neurology, Dr. Lavonna Monarch-- agrees with discharge home given negative MRI.  Continue ASA, hold plavix given her hx of bleeding and remnant blood products still seen on MRI.  Recommends close PCP follow-up.  This was discussed with family and they're comfortable with discharge home. Family did get her a new walker today and hopefully this will help prevent recurrent falls. They understand to return here for any new or worsening symptoms.  Patient discharged home in stable condition.  Case discussed with attending physician, Dr. Judd Lien, who agrees with assessment and plan of care.  Final Clinical Impressions(s) / ED Diagnoses   Final diagnoses:  Fall, initial encounter  Facial droop  Facial bruising, initial encounter    New Prescriptions New Prescriptions   No medications on file     Garlon Hatchet, PA-C 08/31/16  2029    Garlon Hatchet, PA-C 08/31/16 2030    Geoffery Lyons, MD 08/31/16 2340

## 2016-08-31 NOTE — ED Triage Notes (Signed)
Per EMS, pt from [facility] for evaluation after fall. Pt has left sided facial droop, staff unsure when it started. Pt has fallen 3 x in last few days. Denies taking blood thinners, denies pain, A&O x 4. EMS vitals: 164/82, HR-72, RR-16, SpO2-96% 2L [place], CBG-128

## 2016-09-02 LAB — URINE CULTURE

## 2016-09-04 ENCOUNTER — Emergency Department (HOSPITAL_COMMUNITY): Payer: Medicare HMO

## 2016-09-04 ENCOUNTER — Other Ambulatory Visit: Payer: Self-pay

## 2016-09-04 ENCOUNTER — Encounter (HOSPITAL_COMMUNITY): Payer: Self-pay | Admitting: Emergency Medicine

## 2016-09-04 ENCOUNTER — Observation Stay (HOSPITAL_COMMUNITY)
Admission: EM | Admit: 2016-09-04 | Discharge: 2016-09-07 | Disposition: A | Discharge status: Home / Self Care | Payer: Medicare HMO | Attending: Internal Medicine | Admitting: Internal Medicine

## 2016-09-04 DIAGNOSIS — W1830XA Fall on same level, unspecified, initial encounter: Secondary | ICD-10-CM | POA: Insufficient documentation

## 2016-09-04 DIAGNOSIS — R4701 Aphasia: Secondary | ICD-10-CM | POA: Diagnosis present

## 2016-09-04 DIAGNOSIS — Y92099 Unspecified place in other non-institutional residence as the place of occurrence of the external cause: Secondary | ICD-10-CM | POA: Insufficient documentation

## 2016-09-04 DIAGNOSIS — I471 Supraventricular tachycardia, unspecified: Secondary | ICD-10-CM

## 2016-09-04 DIAGNOSIS — I10 Essential (primary) hypertension: Secondary | ICD-10-CM | POA: Diagnosis present

## 2016-09-04 DIAGNOSIS — S064X0A Epidural hemorrhage without loss of consciousness, initial encounter: Secondary | ICD-10-CM | POA: Diagnosis not present

## 2016-09-04 DIAGNOSIS — S022XXA Fracture of nasal bones, initial encounter for closed fracture: Principal | ICD-10-CM | POA: Diagnosis present

## 2016-09-04 DIAGNOSIS — Z7982 Long term (current) use of aspirin: Secondary | ICD-10-CM | POA: Diagnosis not present

## 2016-09-04 DIAGNOSIS — Z880 Allergy status to penicillin: Secondary | ICD-10-CM | POA: Diagnosis not present

## 2016-09-04 DIAGNOSIS — W19XXXA Unspecified fall, initial encounter: Secondary | ICD-10-CM

## 2016-09-04 DIAGNOSIS — S0990XA Unspecified injury of head, initial encounter: Secondary | ICD-10-CM

## 2016-09-04 DIAGNOSIS — I4891 Unspecified atrial fibrillation: Secondary | ICD-10-CM | POA: Insufficient documentation

## 2016-09-04 DIAGNOSIS — R4182 Altered mental status, unspecified: Secondary | ICD-10-CM | POA: Insufficient documentation

## 2016-09-04 DIAGNOSIS — S3993XA Unspecified injury of pelvis, initial encounter: Secondary | ICD-10-CM | POA: Diagnosis not present

## 2016-09-04 DIAGNOSIS — S0083XA Contusion of other part of head, initial encounter: Secondary | ICD-10-CM | POA: Diagnosis not present

## 2016-09-04 DIAGNOSIS — R296 Repeated falls: Secondary | ICD-10-CM

## 2016-09-04 DIAGNOSIS — S02401A Maxillary fracture, unspecified, initial encounter for closed fracture: Secondary | ICD-10-CM | POA: Diagnosis present

## 2016-09-04 DIAGNOSIS — Z8673 Personal history of transient ischemic attack (TIA), and cerebral infarction without residual deficits: Secondary | ICD-10-CM | POA: Insufficient documentation

## 2016-09-04 DIAGNOSIS — S199XXA Unspecified injury of neck, initial encounter: Secondary | ICD-10-CM | POA: Diagnosis not present

## 2016-09-04 DIAGNOSIS — S299XXA Unspecified injury of thorax, initial encounter: Secondary | ICD-10-CM | POA: Diagnosis not present

## 2016-09-04 LAB — CBC
HEMATOCRIT: 37.2 % (ref 36.0–46.0)
Hemoglobin: 12 g/dL (ref 12.0–15.0)
MCH: 28.6 pg (ref 26.0–34.0)
MCHC: 32.3 g/dL (ref 30.0–36.0)
MCV: 88.6 fL (ref 78.0–100.0)
PLATELETS: 266 10*3/uL (ref 150–400)
RBC: 4.2 MIL/uL (ref 3.87–5.11)
RDW: 14.6 % (ref 11.5–15.5)
WBC: 13.5 10*3/uL — AB (ref 4.0–10.5)

## 2016-09-04 LAB — LACTIC ACID, PLASMA: Lactic Acid, Venous: 1.1 mmol/L (ref 0.5–1.9)

## 2016-09-04 LAB — URINALYSIS, ROUTINE W REFLEX MICROSCOPIC
Bilirubin Urine: NEGATIVE
GLUCOSE, UA: NEGATIVE mg/dL
Hgb urine dipstick: NEGATIVE
KETONES UR: NEGATIVE mg/dL
Leukocytes, UA: NEGATIVE
NITRITE: NEGATIVE
PH: 6 (ref 5.0–8.0)
Protein, ur: 100 mg/dL — AB
SPECIFIC GRAVITY, URINE: 1.01 (ref 1.005–1.030)

## 2016-09-04 LAB — I-STAT CHEM 8, ED
BUN: 13 mg/dL (ref 6–20)
CALCIUM ION: 1.05 mmol/L — AB (ref 1.15–1.40)
CHLORIDE: 95 mmol/L — AB (ref 101–111)
CREATININE: 0.7 mg/dL (ref 0.44–1.00)
Glucose, Bld: 154 mg/dL — ABNORMAL HIGH (ref 65–99)
HEMATOCRIT: 37 % (ref 36.0–46.0)
Hemoglobin: 12.6 g/dL (ref 12.0–15.0)
Potassium: 4.1 mmol/L (ref 3.5–5.1)
Sodium: 132 mmol/L — ABNORMAL LOW (ref 135–145)
TCO2: 27 mmol/L (ref 0–100)

## 2016-09-04 LAB — PROTIME-INR
INR: 0.95
PROTHROMBIN TIME: 12.7 s (ref 11.4–15.2)

## 2016-09-04 LAB — COMPREHENSIVE METABOLIC PANEL
ALBUMIN: 3.6 g/dL (ref 3.5–5.0)
ALK PHOS: 76 U/L (ref 38–126)
ALT: 18 U/L (ref 14–54)
AST: 32 U/L (ref 15–41)
Anion gap: 9 (ref 5–15)
BILIRUBIN TOTAL: 0.7 mg/dL (ref 0.3–1.2)
BUN: 11 mg/dL (ref 6–20)
CALCIUM: 9.2 mg/dL (ref 8.9–10.3)
CO2: 25 mmol/L (ref 22–32)
Chloride: 97 mmol/L — ABNORMAL LOW (ref 101–111)
Creatinine, Ser: 0.85 mg/dL (ref 0.44–1.00)
GFR calc Af Amer: >60 mL/min (ref >60)
GLUCOSE: 155 mg/dL — AB (ref 65–99)
POTASSIUM: 4 mmol/L (ref 3.5–5.1)
Sodium: 131 mmol/L — ABNORMAL LOW (ref 135–145)
TOTAL PROTEIN: 6.4 g/dL — AB (ref 6.5–8.1)

## 2016-09-04 LAB — CDS SEROLOGY

## 2016-09-04 LAB — I-STAT CG4 LACTIC ACID, ED: Lactic Acid, Venous: 2.89 mmol/L (ref 0.5–1.9)

## 2016-09-04 LAB — ETHANOL

## 2016-09-04 LAB — SAMPLE TO BLOOD BANK

## 2016-09-04 LAB — TROPONIN I: TROPONIN I: 0.04 ng/mL — AB (ref <0.03)

## 2016-09-04 MED ORDER — DOXYCYCLINE HYCLATE 100 MG PO CAPS
100.0000 mg | ORAL_CAPSULE | Freq: Two times a day (BID) | ORAL | 0 refills | Status: DC
Start: 2016-09-04 — End: 2016-09-07

## 2016-09-04 MED ORDER — LISINOPRIL 5 MG PO TABS
7.5000 mg | ORAL_TABLET | Freq: Every day | ORAL | Status: DC
  Administered 2016-09-04 – 2016-09-05 (×2): 7.5 mg via ORAL
  Filled 2016-09-04: qty 3
  Filled 2016-09-04: qty 1

## 2016-09-04 MED ORDER — DULOXETINE HCL 20 MG PO CPEP
20.0000 mg | ORAL_CAPSULE | ORAL | Status: DC
  Administered 2016-09-05 – 2016-09-06 (×2): 20 mg via ORAL
  Filled 2016-09-04 (×2): qty 1

## 2016-09-04 MED ORDER — ACETAMINOPHEN 325 MG PO TABS
650.0000 mg | ORAL_TABLET | Freq: Four times a day (QID) | ORAL | Status: DC | PRN
Start: 2016-09-04 — End: 2016-09-07
  Administered 2016-09-04 – 2016-09-06 (×2): 650 mg via ORAL
  Filled 2016-09-04 (×2): qty 2

## 2016-09-04 MED ORDER — LEVOTHYROXINE SODIUM 25 MCG PO TABS
25.0000 ug | ORAL_TABLET | Freq: Every day | ORAL | Status: DC
  Administered 2016-09-05 – 2016-09-07 (×3): 25 ug via ORAL
  Filled 2016-09-04 (×3): qty 1

## 2016-09-04 MED ORDER — POLYETHYLENE GLYCOL 3350 17 G PO PACK: 17.0000 g | PACK | Freq: Every day | ORAL | Status: DC | PRN

## 2016-09-04 MED ORDER — DULOXETINE HCL 20 MG PO CPEP
40.0000 mg | ORAL_CAPSULE | ORAL | Status: DC
  Administered 2016-09-07: 40 mg via ORAL
  Filled 2016-09-04: qty 2

## 2016-09-04 MED ORDER — TETANUS-DIPHTH-ACELL PERTUSSIS 5-2.5-18.5 LF-MCG/0.5 IM SUSP
0.5000 mL | Freq: Once | INTRAMUSCULAR | Status: AC
  Administered 2016-09-04: 0.5 mL via INTRAMUSCULAR
  Filled 2016-09-04: qty 0.5

## 2016-09-04 MED ORDER — HYDROCODONE-ACETAMINOPHEN 5-325 MG PO TABS
1.0000 | ORAL_TABLET | Freq: Four times a day (QID) | ORAL | Status: DC | PRN
  Administered 2016-09-04 – 2016-09-07 (×3): 1 via ORAL
  Filled 2016-09-04 (×3): qty 1

## 2016-09-04 MED ORDER — DAILY-VITE PO TABS: 1.0000 | ORAL_TABLET | Freq: Every day | ORAL | Status: DC

## 2016-09-04 MED ORDER — DULOXETINE HCL 20 MG PO CPEP: 20.0000 mg | ORAL_CAPSULE | ORAL | Status: DC

## 2016-09-04 MED ORDER — SODIUM CHLORIDE 0.9% FLUSH
3.0000 mL | Freq: Two times a day (BID) | INTRAVENOUS | Status: DC
  Administered 2016-09-04 – 2016-09-07 (×5): 3 mL via INTRAVENOUS

## 2016-09-04 MED ORDER — ACETAMINOPHEN 650 MG RE SUPP: 650.0000 mg | Freq: Four times a day (QID) | RECTAL | Status: DC | PRN

## 2016-09-04 MED ORDER — ASPIRIN 81 MG PO CHEW
81.0000 mg | CHEWABLE_TABLET | Freq: Every day | ORAL | Status: DC
  Administered 2016-09-04 – 2016-09-07 (×4): 81 mg via ORAL
  Filled 2016-09-04 (×4): qty 1

## 2016-09-04 MED ORDER — POLYVINYL ALCOHOL 1.4 % OP SOLN
1.0000 [drp] | Freq: Every morning | OPHTHALMIC | Status: DC
  Administered 2016-09-04 – 2016-09-07 (×4): 1 [drp] via OPHTHALMIC
  Filled 2016-09-04 (×2): qty 15

## 2016-09-04 MED ORDER — METOPROLOL SUCCINATE ER 50 MG PO TB24
150.0000 mg | ORAL_TABLET | ORAL | Status: DC
  Administered 2016-09-05 – 2016-09-06 (×2): 150 mg via ORAL
  Filled 2016-09-04 (×2): qty 1

## 2016-09-04 MED ORDER — FLUTICASONE PROPIONATE 50 MCG/ACT NA SUSP
2.0000 | Freq: Every day | NASAL | Status: DC
  Administered 2016-09-04 – 2016-09-07 (×4): 2 via NASAL
  Filled 2016-09-04 (×2): qty 16

## 2016-09-04 MED ORDER — MUSCLE RUB 10-15 % EX CREA: 1.0000 "application " | TOPICAL_CREAM | Freq: Three times a day (TID) | CUTANEOUS | Status: DC | PRN

## 2016-09-04 MED ORDER — LABETALOL HCL 5 MG/ML IV SOLN
10.0000 mg | INTRAVENOUS | Status: AC | PRN
  Administered 2016-09-04: 10 mg via INTRAVENOUS
  Filled 2016-09-04: qty 4

## 2016-09-04 MED ORDER — PANTOPRAZOLE SODIUM 20 MG PO TBEC
20.0000 mg | DELAYED_RELEASE_TABLET | Freq: Every day | ORAL | Status: DC
  Administered 2016-09-04 – 2016-09-06 (×3): 20 mg via ORAL
  Filled 2016-09-04 (×3): qty 1

## 2016-09-04 MED ORDER — ENOXAPARIN SODIUM 40 MG/0.4ML ~~LOC~~ SOLN
40.0000 mg | SUBCUTANEOUS | Status: DC
  Administered 2016-09-04 – 2016-09-06 (×3): 40 mg via SUBCUTANEOUS
  Filled 2016-09-04 (×3): qty 0.4

## 2016-09-04 NOTE — ED Provider Notes (Signed)
Taken his sign out pending social work consult. Patient with fall from standing and facial injuries. Patient remained nonverbal during 2450. Observation the emergency department. She is yet to return to her mental baseline. She is following commands and moving all extremities. Family is at bedside. States she had a similar presentation with her stroke several months ago. Review of patient's workup demonstrates normal MRI/MRA/EEG. Question psychogenic versus TIA. Discussed with internal medicine service and will admit for observation.   Loren RacerYelverton, Melis Trochez, MD 09/04/16 1255

## 2016-09-04 NOTE — Progress Notes (Signed)
MD aware of pt heart rhythm and EKG in chart, no new orders   Ebony [name]

## 2016-09-04 NOTE — Discharge Summary (Addendum)
Name: Ebony [name] MRN: [id number] DOB: 1931/06/01 83 y.o. PCP: Florentina Jennyripp, Henry, MD  Date of Admission: 09/04/2016  4:44 AM Date of Discharge: 09/07/2016 Attending Physician: Doneen PoissonKlima, Lawrence, MD  Discharge Diagnosis: 1. Nasal and Maxillary Sinus Fracture  2. Recurrent Falls  3. Altered Mental Status  4. Paroxysmal Supraventricular Tachycardia  5. Hypertension   Principal Problem:   Maxillary sinus fracture [facility](HCC) Active Problems:   Fall   Essential hypertension   Expressive aphasia   Recurrent falls   Closed fracture of nasal bone   Altered mental status   Paroxysmal supraventricular tachycardia [facility](HCC)   Discharge Medications: Allergies as of 09/07/2016      Reactions   Sulfa Antibiotics Diarrhea   Ace Inhibitors    Allergy not noted on MAR   Biaxin [clarithromycin] Diarrhea   Penicillins Diarrhea   Has patient had a PCN reaction causing immediate rash, facial/tongue/throat swelling, SOB or lightheadedness with hypotension: Unknown Has patient had a PCN reaction causing severe rash involving mucus membranes or skin necrosis: Unknown Has patient had a PCN reaction that required hospitalization: Unknown Has patient had a PCN reaction occurring within the last 10 years: Unknown If all of the above answers are "NO", then may proceed with Cephalosporin use.   Klor-con [potassium Chloride] Other (See Comments)   [facility] be an [facility]   Cozaar [losartan Potassium] Rash      Medication List    STOP taking these medications   ALPRAZolam 0.25 MG tablet Commonly known as:  XANAX     TAKE these medications   acetaminophen 500 MG tablet Commonly known as:  TYLENOL Take 500 mg by mouth 2 (two) times daily.   ARTIFICIAL TEARS 0.1-0.3 % Soln Generic drug:  Dextran 70-Hypromellose Place 1 drop into both eyes every morning.   aspirin 81 MG chewable tablet Chew 81 mg by mouth daily.   BIOFREEZE 4 % Gel Generic drug:  Menthol (Topical Analgesic) Apply 1  application topically every 8 (eight) hours as needed (knee pain).   DAILY-VITE Tabs Take 1 tablet by mouth daily.   DULoxetine 20 MG capsule Commonly known as:  CYMBALTA Take 20-40 mg by mouth See admin instructions. 20 mg in the morning on Sun/Tues/Thurs/Sat and 40 mg on Mon/Wed/Fri   fluticasone 50 MCG/ACT nasal spray Commonly known as:  FLONASE Place 2 sprays into both nostrils daily.   HYDROcodone-acetaminophen 5-325 MG tablet Commonly known as:  NORCO/VICODIN Take one tablet by mouth every 8 hours as needed for pain   ibuprofen 200 MG tablet Commonly known as:  ADVIL,MOTRIN Take 200 mg by mouth every 12 (twelve) hours as needed (for pain).   levothyroxine 25 MCG tablet Commonly known as:  SYNTHROID, LEVOTHROID Take 1 tablet (25 mcg total) by mouth daily before breakfast. What changed:  when to take this   lisinopril 20 MG tablet Commonly known as:  PRINIVIL,ZESTRIL Take 1 tablet (20 mg total) by mouth daily. Start taking on:  09/08/2016 What changed:  medication strength  how much to take   Melatonin 3 MG Caps By mouth qhs for sleep   metoprolol succinate 100 MG 24 hr tablet Commonly known as:  TOPROL-XL Take 150 mg by mouth every morning. for HTN.   ondansetron 4 MG tablet Commonly known as:  ZOFRAN Take 4 mg by mouth every 6 (six) hours as needed for nausea or vomiting.   pantoprazole 20 MG tablet Commonly known as:  PROTONIX Take 20 mg by mouth at bedtime.  saccharomyces boulardii 250 MG capsule Commonly known as:  FLORASTOR Take 250 mg by mouth 2 (two) times daily.   THERA-M PO Take 1 tablet by mouth every morning.       Disposition and follow-up:   Ebony [name] was discharged from [facility] in Good condition.  At the hospital follow up visit please address:  1.   Nasal and Maxillary Sinus Fractures  - Ensure follow-up with Dr. Ulice Bold in 1-2 weeks   - Ensure not visual changes or pain with extraocular eye  movements  Recurrent Falls - Assess need for Norco   Hypertension  - Assess BP to ensure she is not over treated  - Check BMP to ensure she does not have hyperkalemia   2.  Labs / imaging needed at time of follow-up: CMP due to increase in patient's Lisinopril   3.  Pending labs/ test needing follow-up: N/A  Follow-up Appointments: Follow-up Information    Suzanna Obey, MD Follow up.   Specialty:  Otolaryngology Why:  follow up facial fractures Contact information: [address] [place] [address] [phone]        Peggye Form, DO. Schedule an appointment as soon as possible for a visit in 2 week(s).   Specialty:  Plastic Surgery Why:  Please call to make appointment  Contact information: [address] [place] [address] [phone]           Hospital Course by problem list: Principal Problem:   Maxillary sinus fracture [facility]) Active Problems:   Fall   Essential hypertension   Expressive aphasia   Recurrent falls   Closed fracture of nasal bone   Altered mental status   Paroxysmal supraventricular tachycardia (HCC)   1. Nasal and Maxillary Sinus Fracture. Ebony [name] is a 83 y.o. Female who presented to the ED after an unwitnessed traumatic fall at her nursing home this morning. CT head, Cervical Spine, and Maxillofacial in the ED illustrated no evidence of traumatic intracranial injury, fracture of the medial walls of both maxillary sinuses, and mildly comminuted and depressed fracture involving both sides of the nasal bone. Plastic surgery was contacted. Dr. Ulice Bold recommended no further intervention currently. Recommended outpatient follow-up in 1-2wks.   2. Recurrent Falls. Ebony [name] has had multiple falls since April 2018. The presenting fall was unwitnessed, she states that it was mechanical in nature. Additional cardiac eitology could not be ruled out, initial troponin 0.04 which trended up to 0.16 before coming  back down. Initially ECG illustrate no ST elevations or new LBBB, but did show sinus tachycardia with multiple PACs. She was put on telemetry and appeared to enter SVT multiple times over the course of her hospitalization. It is difficult to determine whether or not this A-Fib; however, she is currently on Metoprolol and due to her frequent falls and history of SAH she is not a candidate for anticoagulation. CT head on 09/04/2016 revealed no acute intracranial trauma or ischemia. Her medication list did contain medications that could contribute to her increasing fall frequency, including Xanax and Norco. The Xanax was stopped for this hospitalization, Norco continued for pain management. PT evaluated the patient on admission, recommending discharge to ALF with PT, OT, and RN. Continue to hold her Xanax.   3. Altered Mental Status. Ebony [name] was minimally interactive on presentation, but gradually improved over the course of her hospitalization. With AMS and the fall, stroke work-up was initiated. Hx of TIA/delirum, on admission in April  2018 she was noted to have aphasia. CT head, CTA head, and MRI on 06/24/2016 of the head illustrated no acute process; however, did reveal chronic ischemic changes.  CT head on 09/05/2015 showed no intracranial bleeding. Neuro exam was unremarkable through her hospitalization. She likely sustained a concussion.  4. Paroxysmal Supraventricular Tachycardia. Ebony [name] was placed on telemetry once she was admitted to the hospital. Rhythm strips illustrated 1 degree heart block and multiple episodes of SVT. It was difficult to determine whether the SVT was A-Fib or not. However, she is currently on rate control with Metoprolol ER 150mg . It was discussed with family whether or not anticoagulation was a good idea. Given her recurrent falls and history of SAH, we will not start her on anticoagulation. Family agrees.   5. Hypertension. Ebony [name] had high BP throughout her hospitalization.  Home medications included Lisinopril 7.5 mg and Metoprolol ER 150 mg. We increased her Lisinopril gradually to 20 mg PO QD and left her metoprolol at 150 mg PO QD. Continue evaluation as outpatient. Due to her recurrent falls I recommend not to tightly control her BP.   Discharge Vitals:   BP (!) 143/82   Pulse 89   Temp 98.3 F (36.8 C) (Oral)   Resp 19   Ht 5\' 2"  (1.575 m)   Wt 152 lb (68.9 kg)   SpO2 95%   BMI 27.80 kg/m   Pertinent Labs, Studies, and Procedures:  CT Cervical Spine W/O Contrast  CT Maxillofacial W/O Contrast  CT Head W/O Contrast  IMPRESSION: 1. No evidence of traumatic intracranial injury. 2. Blood noted partially filling the maxillary sinuses bilaterally, suspicious for fractures of the medial walls of both maxillary sinuses. 3. Mildly comminuted and depressed fracture involving both sides of the nasal bone, with overlying soft tissue swelling. 4. No evidence of acute fracture or subluxation along the cervical spine. 5. Moderate cortical volume loss and diffuse small vessel ischemic microangiopathy. 6. Soft tissue swelling overlying the left frontal calvarium and about the left orbit. 7. Chronic flattening of the mandibular condylar heads bilaterally, reflecting temporomandibular joint disease. 8. Mild degenerative change along the cervical spine.  Chest X-ray (1 View) IMPRESSION: No active disease.  Pelvic X-Ray IMPRESSION: No hip fracture or pelvic diastasis.  Discharge Instructions: Discharge Instructions    Diet - low sodium heart healthy    Complete by:  As directed    Increase activity slowly    Complete by:  As directed       Signed: Levora Dredge, MD 09/07/2016, 1:36 PM   Pager: My Pager: [phone]

## 2016-09-04 NOTE — Evaluation (Signed)
Physical Therapy Evaluation Patient Details Name: Ebony [name] MRN: [id number] DOB: Aug 13, 1931 Today's Date: 09/04/2016   History of Present Illness  Pt is an 83 yo female seen in ED following a fall early this AM resulting in a nasal fx. Pt has had 5 falls in the past week. Pt was in ED earlier this week following a fall out of her chair. Pt was admitted in April of this year with possible stroke. PMH significant for acute ischemia stroke 2/14, anxiety, OA, HLD, HTN, subdural hematoma with subarachnoid hemorrhage 3/13.    Clinical Impression  Pt presents with the above diagnosis and below deficits for therapy evaluation. Prior to admission, pt was living in an ALF at [facility]. Pt was able to get around the facility with a RW, but has had multiple falls in the past year due to poor RW use per daughters. Pt has had 5 falls in the past week, which family reports is because her daughter was out of town. Pt is not very responsive to therapy questions and has difficulty following commands. Pt requires Max A for bed mobility which was the only function safe to test at this time. Pt will benefit from placement in SNF for short term rehab in order to improve her mobility and reduce future falls. Pt continues to require acute PT follow-up to address out of bed mobility and transfers.    Follow Up Recommendations SNF;Supervision/Assistance - 24 hour    Equipment Recommendations  3in1 (PT)    Recommendations for Other Services OT consult     Precautions / Restrictions Precautions Precautions: Fall Precaution Comments: 5 falls in the past week Restrictions Weight Bearing Restrictions: No      Mobility  Bed Mobility Overal bed mobility: Needs Assistance Bed Mobility: Supine to Sit;Sit to Supine     Supine to sit: Max assist Sit to supine: Mod assist   General bed mobility comments: Max A to get EOB with assistance for LE's and trunk upright, Mod A to position back on bed.    Transfers                 General transfer comment: unsafe to test at this time due to level of arousal  Ambulation/Gait         Gait velocity: unsafe to assess at this time      Stairs            Wheelchair Mobility    Modified Rankin (Stroke Patients Only)       Balance Overall balance assessment: Needs assistance Sitting-balance support: Bilateral upper extremity supported Sitting balance-Leahy Scale: Poor Sitting balance - Comments: requires assistance to maintain sitting upright at EOB Postural control: Posterior lean                                   Pertinent Vitals/Pain Pain Assessment: No/denies pain    Home Living Family/patient expects to be discharged to:: Assisted living               Home Equipment: Walker - 2 wheels;Walker - 4 wheels;Walker - standard;Bedside commode;Shower seat;Grab bars - toilet      Prior Function Level of Independence: Independent with assistive device(s)         Comments: able to get around with AD.      Hand Dominance   Dominant Hand: Right    Extremity/Trunk Assessment   Upper Extremity Assessment Upper  Extremity Assessment: Defer to OT evaluation    Lower Extremity Assessment Lower Extremity Assessment: Generalized weakness       Communication   Communication: No difficulties  Cognition Arousal/Alertness: Lethargic Behavior During Therapy: Flat affect Overall Cognitive Status: Difficult to assess                                 General Comments: Pt is not very responsive to therapy questions and does not consistenly follow commands.       General Comments      Exercises     Assessment/Plan    PT Assessment Patient needs continued PT services  PT Problem List Decreased strength;Decreased activity tolerance;Decreased balance;Decreased mobility;Decreased knowledge of use of DME;Decreased safety awareness       PT Treatment Interventions DME  instruction;Gait training;Functional mobility training;Therapeutic activities;Therapeutic exercise;Balance training    PT Goals (Current goals can be found in the Care Plan section)  Acute Rehab PT Goals Patient Stated Goal: Family would like pt to got o rehab and then home PT Goal Formulation: With family Time For Goal Achievement: 09/18/16 Potential to Achieve Goals: Good    Frequency Min 2X/week   Barriers to discharge        Co-evaluation               AM-PAC PT "6 Clicks" Daily Activity  Outcome Measure Difficulty turning over in bed (including adjusting bedclothes, sheets and blankets)?: Total Difficulty moving from lying on back to sitting on the side of the bed? : Total Difficulty sitting down on and standing up from a chair with arms (e.g., wheelchair, bedside commode, etc,.)?: Total Help needed moving to and from a bed to chair (including a wheelchair)?: Total Help needed walking in hospital room?: Total Help needed climbing 3-5 steps with a railing? : Total 6 Click Score: 6    End of Session   Activity Tolerance: Patient limited by lethargy Patient left: in bed;with call bell/phone within reach;with family/visitor present Nurse Communication: Mobility status PT Visit Diagnosis: Unsteadiness on feet (R26.81);Muscle weakness (generalized) (M62.81);History of falling (Z91.81);Repeated falls (R29.6);Difficulty in walking, not elsewhere classified (R26.2)    Time: [id number] PT Time Calculation (min) (ACUTE ONLY): 23 min   Charges:   PT Evaluation $PT Eval Moderate Complexity: 1 Procedure PT Treatments $Therapeutic Activity: 8-22 mins   PT G Codes:   PT G-Codes **NOT FOR INPATIENT CLASS** Functional Assessment Tool Used: AM-PAC 6 Clicks Basic Mobility;Clinical judgement Functional Limitation: Mobility: Walking and moving around Mobility: Walking and Moving Around Current Status ([id number]): 100 percent impaired, limited or restricted Mobility: Walking and Moving  Around Goal Status (J1914(G8979): At least 1 percent but less than 20 percent impaired, limited or restricted    Ebony [name] PT, DPT  [phone]   Ebony [name] 09/04/2016, 9:15 AM

## 2016-09-04 NOTE — Discharge Planning (Signed)
[facility] higher level of care for pt.  EDCM placed PT consult and will await evaluation for dispostiion needs. Nanette Wirsing J. Lucretia RoersWood, RN, BSN, [place] [phone]

## 2016-09-04 NOTE — Progress Notes (Signed)
MD called to check on pt heart rate, MD aware. Informed MD that pt neuro status remains unchanged, MD aware pt having bloody nose and frequent dry coughs. MD asked about pt cardiac medications. Informed MD that pt received lisinopril at 1244. MD stated he will check on pt again before end of shift. No new orders at this time   Kirstine Jacquin Elige RadonBradley

## 2016-09-04 NOTE — H&P (Signed)
Date: 09/04/2016               Patient Name:  Ebony [name] MRN: [id number]  DOB: May 17, 1931 Age / Sex: 83 y.o., female   PCP: Florentina Jenny, MD         Medical Service: Internal Medicine Teaching Service         Attending Physician: Dr. Doneen Poisson, MD    First Contact: Dr. Caron Presume Pager: [phone]  Second Contact: Dr. Loney Loh Pager: [phone]       After Hours (After 5p/  First Contact Pager: [phone]  weekends / holidays): Second Contact Pager: [phone]   Chief Complaint: fall at [facility], decreased interactiveness  History of Present Illness:  This is a 83 y.o. woman with PMHx of CVA, depression, GERD, HLD, HTN, hypothyroidism, hx of SAH, hx of subdural hematoma, hospitalization in April 2018 for aphasia felt to be due to delirium vs TIA, ? Atrial fibrillation who presents after a fall from her assisted living facility at [facility].  She is not very interactive on examination so much of the history is obtained from her 2 daughters and per chart review.    Apparently, patient had a fall early this AM at her ALF.  Daughter received a call that her mom had fallen and had a nose bleed that was unable to be controlled so she was being transported to the ED.  At baseline, they report she is very conversational and was apparently talking to the EMTs en route to the hospital.  However, since arriving to the ED she has been less interactive, falling asleep quickly and only responding with 1 or 2 words at a time.  They report she had similar presentation with her prior stroke a few years back.  They report she has had multiple falls at her ALF in recent days attributable to not using her walker or wheelchair.  They think she may have fallen this morning getting up from the toilet after using the bathroom and not having her assistive mobility devices with her.  At baseline, they report she is very interactive, referring to her as the "[facility]" and she only has some mild  cognitive impairment to be expected with her age.  Her family is concerned about recent falls and feels that she may benefit from a SNF stay to improve her strength before returning to ALF.  She was seen by PT in the ED who recommended SNF placement.  She was being planned to DC from the ED, but due to her decreased level of alertness, IMTS was contacted for admission and further work up.  In the ED, CT head and cervical spine were negative. CT maxillofacial did show evidence of a nasal fracture and maxillary sinus fractures.  CXR did not show a pneumonia and UA was not indicative of infection.  Other labs were notable for a mild lactic acidosis, mildly elevated WBC, hyponatremia  Of note, patient was recently seen in the ED on July 2 for another fall and new Left sided facial droop.  MRI at that time negative for acute stroke but did note old changes.  Her case at that time was discussed with neurology who recommended close PCP follow up, continuing ASA, holding any Plavix (does not appear to have been a current medication) given history of bleeding and remnant blood products still seen on MRI.    Meds:  Current Meds  Medication Sig  . acetaminophen (TYLENOL) 500 MG tablet Take 500  mg by mouth 2 (two) times daily.  Marland Kitchen. ALPRAZolam (XANAX) 0.25 MG tablet Take 1 tablet (0.25 mg total) by mouth 2 (two) times daily.  Marland Kitchen. aspirin 81 MG chewable tablet Chew 81 mg by mouth daily.  Marland Kitchen. Dextran 70-Hypromellose (ARTIFICIAL TEARS) 0.1-0.3 % SOLN Place 1 drop into both eyes every morning.   . DULoxetine (CYMBALTA) 20 MG capsule Take 20-40 mg by mouth See admin instructions. 20 mg in the morning on Sun/Tues/Thurs/Sat and 40 mg on Mon/Wed/Fri  . fluticasone (FLONASE) 50 MCG/ACT nasal spray Place 2 sprays into both nostrils daily.  Marland Kitchen. HYDROcodone-acetaminophen (NORCO/VICODIN) 5-325 MG tablet Take one tablet by mouth every 8 hours as needed for pain  . ibuprofen (ADVIL,MOTRIN) 200 MG tablet Take 200 mg by mouth every 12  (twelve) hours as needed (for pain).   Marland Kitchen. levothyroxine (SYNTHROID, LEVOTHROID) 25 MCG tablet Take 1 tablet (25 mcg total) by mouth daily before breakfast. (Patient taking differently: Take 25 mcg by mouth at bedtime. )  . lisinopril (PRINIVIL,ZESTRIL) 5 MG tablet Take 7.5 mg by mouth daily.  . Melatonin 3 MG CAPS By mouth qhs for sleep  . Menthol, Topical Analgesic, (BIOFREEZE) 4 % GEL Apply 1 application topically every 8 (eight) hours as needed (knee pain).  . metoprolol (TOPROL-XL) 100 MG 24 hr tablet Take 150 mg by mouth every morning. for HTN.  . Multiple Vitamin (DAILY-VITE) TABS Take 1 tablet by mouth daily.  . Multiple Vitamins-Minerals (THERA-M PO) Take 1 tablet by mouth every morning.  . ondansetron (ZOFRAN) 4 MG tablet Take 4 mg by mouth every 6 (six) hours as needed for nausea or vomiting.  . pantoprazole (PROTONIX) 20 MG tablet Take 20 mg by mouth at bedtime.   . saccharomyces boulardii (FLORASTOR) 250 MG capsule Take 250 mg by mouth 2 (two) times daily.     Allergies: Allergies as of 09/04/2016 - Review Complete 09/04/2016  Allergen Reaction Noted  . Sulfa antibiotics Diarrhea 12/25/2010  . Ace inhibitors  08/24/2012  . Biaxin [clarithromycin] Diarrhea 08/24/2012  . Penicillins Diarrhea 12/25/2010  . Klor-con [potassium chloride] Other (See Comments) 08/31/2016  . Cozaar [losartan potassium] Rash 08/24/2012   Past Medical History:  Diagnosis Date  . Acute ischemic stroke (HCC) 04/11/2012  . Allergic rhinitis   . Anxiety   . Arthritis    Knees  . Depression   . Fibrocystic breast disease   . Hiatal hernia   . Hyperlipidemia   . Hypertension   . IFG (impaired fasting glucose)   . SDH (subdural hematoma) (HCC) 05/16/2011  . Subarachnoid hemorrhage following injury (HCC) 05/16/2011  . Subarachnoid hemorrhage following injury (HCC) 05/16/2011  . Unspecified hypothyroidism 04/15/2012    Family History: Hx of lung cancer in sister.  Negative family hx of strokes, DM,  HTN  [facility] History: [facility], widowed, retired Geneticist, moleculardental tech, has 2 daughters who both live locally.  No hx of EtOH, tobacco, or drug use.  Review of Systems: A complete ROS was unable to be obtained due to AMS.  Physical Exam: Blood pressure (!) 173/90, pulse 73, temperature 98.1 F (36.7 C), temperature source Oral, resp. rate 18, height 5\' 2"  (1.575 m), weight 154 lb 6.4 oz (70 kg), SpO2 93 %. Physical Exam  Constitutional: She is well-developed, well-nourished, and in no distress.  HENT:  She has areas of ecchymosis around both eyes with Left > Right. Gauze in her right nostril. Dry blood in her mouth. Mild facial tenderness appreciated.  Eyes: EOM are normal.  Pupils are equal, round, and reactive to light.  Neck: Normal range of motion. Neck supple.  Cardiovascular:  Irregular rhythm with occasional skipped beats.  No murmur appreciated.  Pulmonary/Chest: Effort normal and breath sounds normal. No respiratory distress. She has no wheezes.  Abdominal: Soft. Bowel sounds are normal. She exhibits no distension. There is no tenderness.  Musculoskeletal: She exhibits no edema.  Neurological: She is alert.  She is alert and able to follow commands, but is sleepy and only responds in 1-2 word answers. 5/5 upper extremity strength bilaterally 0/5 bilateral lower extremity strength possibly due to effort Unable to perform finger-to-nose testing. CN II-XII grossly intact except for a mild Left facial droop that daughter's noticed on Monday.  Skin: Skin is warm and dry.     Labs:  Na 131, K 4, Cl 97, CO2 25, BUN 11, Cr 0.85, Gluc 155, Anion gap 9 AST 32, ALT 18, AP 76, Total Bili 0.7 WBC 13.5, Hgb 12, HCT 37.2, Plt 266, MCV 88.6 Ethanol negative PT 12.7, INR 0.95 Lactic acid 2.89 UA negative for Hgb, bilirubin, ketones, nitrites, leukocytes.  Positive for protein, few bacteria, and 0-5 squamous epithelial cells   EKG:  Rate 85, Pacemaker spikes or artifacts,  Atrial flutter, Left ventricular hypertrophy, ST depression, consider ischemia, inferior leads.  Repeat EKG shows rate of 84, QTc 464, shows sinus rhythm with P-waves before each QRS.  There is some discrepancy in P-wave morphology but does not appear to be consistent with atrial fibrillation/flutter but rather PACs.  CXR: personally reviewed my interpretation is that there is no active pulmonary disease.  Lungs appear clear without effusion.  CT Head, Cervical Spine, and Maxillofacial w/o Contrast IMPRESSION: 1. No evidence of traumatic intracranial injury. 2. Blood noted partially filling the maxillary sinuses bilaterally, suspicious for fractures of the medial walls of both maxillary sinuses. 3. Mildly comminuted and depressed fracture involving both sides of the nasal bone, with overlying soft tissue swelling. 4. No evidence of acute fracture or subluxation along the cervical spine. 5. Moderate cortical volume loss and diffuse small vessel ischemic microangiopathy. 6. Soft tissue swelling overlying the left frontal calvarium and about the left orbit. 7. Chronic flattening of the mandibular condylar heads bilaterally, reflecting temporomandibular joint disease. 8. Mild degenerative change along the cervical spine.  MRI Brain W/O Contrast 08/31/2016: IMPRESSION: 1. No acute infarct or acute intracranial hemorrhage. 2. Blood breakdown products left frontal lobe sulcus and punctate blood breakdown products left cerebellum unchanged from prior exam consistent with prior episodes of hemorrhage. 3.Remote anterior left frontal lobe infarct. 4.Prominent chronic microvascular changes, confluent periventricular region. 5.Moderate global atrophy.  Assessment & Plan by Problem:  Recurrent Falls Decreased Alertness Expressive Aphasia Hx of CVA Patient presents with a unwitnessed fall from her nursing home early this morning.  Now with nasal and maxillary sinus fractures, decreased level of  alertness, and some expressive aphasia.  ED visit on July 2 with negative MRI after reportedly new left-sided facial droop.  CT head today with no evidence of acute intracranial injury.  She had an admission in April 2018 with aphasia that was felt to be due to delirium vs TIA.  MRI and MRA at that time with no acute stroke but was consistent with chronic findings.  EEG with no seizure.  Her symptoms improved greatly during that admission and she was evaluated by neurology who recommended no further intervention.  Possibly related to underlying dementia per notes.   Her falls have been occurring more frequently  and PT today has recommended SNF placement if possible to which her daughters are agreeable.  She has atrial fibrillation on her chart but otherwise it has not been documented since 2015.  She is not on anticoagulation, but is on metoprolol.  Her initial EKG was somewhat confusing so another has been obtained and is showing sinus rhythm with variable P-wave morphology but no obvious atrial fibrillation or flutter.  She has no other history of arrhythmias.  She has Xanax and Norco on her medication list which may also contribute to falls.   Ultimately, her decreased alertness may be due to post-concussion given her facial fractures and recent head trauma.  If she does not improve, I think repeating an MRI could be warranted.  She otherwise does not have any focal neurologic deficits and no infectious symptoms.  Her elevated WBC maybe reactive from trauma.   - Repeat EKG as above - Telemetry - Hold Xanax that may be contributing to falls - Continue ASA - PT/OT/SLP eval - CSW consult for dispo planning - Repeat MRI if symptoms do not start to improve  Nasal Fracture, Maxillary Sinus Fracture  - Attempted to discuss with ENT who recommended calling Plastics as they are on-call for trauma.  Have reached out to them.  Currently awaiting call back.  - Tylenol for moderate pain - Norco for severe  pain  HTN: BP elevated on admission 150-180s.  Has not received any of her morning meds - Continue home Lisinopril  - Continue home Metoprolol  Undetermined Hx of Atrial Fibrillation  Noted on her problem list from 2015 with "cardizem and toprol for rate/rhythm, ASA as prophylaxis."  Daughters report no history of arrythmia such as atrial fibrillation.  Currently, only cardiac meds are Metoprolol, Ace inhibitor and ASA for secondary prevention of stroke.  Diltiazem is listed on her last DC summary from April but is not on her current [facility] from [facility].  If she does have Atrial fibrillation her Chads2Vasc score is 6 with Stroke risk of 9.7% per year.  This would warrant discussion with family regarding need for any anti-coagulation.  However, her repeat EKG does not appear to be in atrial fibrillation as best I can tell, but rather more consistent with PAC's. - Repeat EKG as above - Telemetry  Hyponatremia Mild, likely not of clinical significance at this time - monitor with morning labs  Lactic Acidosis - repeat LA level to ensure going down  Leukocytosis Currently no infectious symptoms - Trend and monitor clinically  Hypothyroidism - Continue Synthroid  Anxiety/Depression/Insomnia: has Cymbalta, Remeron, and Xanax on her MAR - Continue home Cymbalta - Hold home Xanax - Hold Remeron 7.5mg  QHS. Consider restarting if needed.  GERD - continue home PPI  DVT PPx: Lovenox  Diet: Heart Healthy  CODE: DO NOT RESUSCITATE  Dispo: Admit patient to Inpatient with expected length of stay greater than 2 midnights.  SignedGwynn Burly, DO 09/04/2016, 3:09 PM  Pager: [phone]

## 2016-09-04 NOTE — Progress Notes (Signed)
Ordered high low bed Pt has left facial droop, family states this is not pt baseline. Pt is nonverbal, pt states this is not pt baseline. Pt family states they informed MD. Pt family states this is how pt acted 5 years ago when pt had a CVA, family states they informed MD  Ebony [name]

## 2016-09-04 NOTE — ED Notes (Signed)
Pt returned from CT. Skin tear on L elbow cleaned and bacitracin applied. Wrapped in gauze.

## 2016-09-04 NOTE — Discharge Planning (Signed)
EDCM met with pt and daughters at bedside regarding discharge planning.  Pt daughters concerned with mother level of engagement states it's similar to when pt had stroke a few years ago. EDCM discussed with EDP.    EDCM updated EDSW (covering) via telephone. Micha Dosanjh J. Clydene Laming, [place], [place], Kathryn

## 2016-09-04 NOTE — Progress Notes (Signed)
   09/04/16 0500  Clinical Encounter Type  Visited With Patient  Visit Type ED  Referral From Nurse  Spiritual Encounters  Spiritual Needs Emotional  Stress Factors  Patient Stress Factors Other (Comment) (disoriented)  Chaplain called for level 782 trauma, 83 year old woman who fell in nursing home. No family present. Chaplain talked to patient in ED, hummed hymns, got no immediate response. Kyah Buesing, Chaplain

## 2016-09-04 NOTE — Progress Notes (Signed)
SLP Cancellation Note  Patient Details Name: Ebony [name] MRN: [id number] DOB: 04-15-1931   Cancelled treatment:       Reason Eval/Treat Not Completed: Other (comment) Orders received for SLP swallow evaluation.  Pt passed RN stroke swallow screen in ED; per policy, SLP swallow not indicated if pt passes screen.  Our services will sign off.  D/W RN, pt's daughter.    Blenda MountsCouture, Kaia Depaolis Laurice 09/04/2016, 3:39 PM

## 2016-09-04 NOTE — Progress Notes (Signed)
Patient starting to talk and starting to respond to question appropriately.Still not verbalizing much. BP remains high. Spoke with Dr. Drue SecondN Boswell who ordered Labetalol with some good effect. Will continue to monitor patient.

## 2016-09-04 NOTE — ED Notes (Signed)
Sent text page to admitting MD to make aware pt passed swallow evaluation, EKG has been done, BP 184/87 can we order her home meds please. He returned call and will do so.

## 2016-09-04 NOTE — ED Notes (Signed)
Pt daughter at bedside. Waiting for social work consult, to evaluate current placement of patient, as she may need higher level of care. Pt is alert, non-verbal. Offered water, patient didn't want. Right nostril is packed with gauze, small amount of bleeding from left nare.

## 2016-09-04 NOTE — Progress Notes (Signed)
MD at bedside, requested neuro checks. Asked MD if she wanted transfer orders to neuro floor, MD denied. MD aware of pt facial droop on left side that family reports is new. MD aware MRSA swab uncompleted due to trauma and bloody nose drainage   Ebony [name]

## 2016-09-04 NOTE — Progress Notes (Signed)
Pt rhythm change to afib, EKG completed and in chart. Paged MD, awaiting call back   Ebony [name]

## 2016-09-04 NOTE — ED Provider Notes (Signed)
[facility]-EMERGENCY DEPT Provider Note   CSN: [id number] Arrival date & time: 09/04/16  0444     History   Chief Complaint Chief Complaint  Patient presents with  . Fall    HPI Ebony [name] is a 83 y.o. female.  Patient presents to the emergency department by ambulance after an unwitnessed fall. Patient fell and called out to staff at [facility]. EMS found the patient on the ground, bleeding from her nose. EMS reports that initially she was awake, alert and oriented, but during transport she has become nonverbal. Level V Caveat due to acuity/nonverbal.      Past Medical History:  Diagnosis Date  . Acute ischemic stroke (HCC) 04/11/2012  . Allergic rhinitis   . Anxiety   . Arthritis    Knees  . Depression   . Fibrocystic breast disease   . Hiatal hernia   . Hyperlipidemia   . Hypertension   . IFG (impaired fasting glucose)   . SDH (subdural hematoma) (HCC) 05/16/2011  . Subarachnoid hemorrhage following injury (HCC) 05/16/2011  . Subarachnoid hemorrhage following injury (HCC) 05/16/2011  . Unspecified hypothyroidism 04/15/2012    Patient Active Problem List   Diagnosis Date Noted  . Acute encephalopathy   . Expressive aphasia   . Ischemic stroke (HCC) 06/24/2016  . Depression with anxiety 06/24/2016  . Hypothyroid 06/24/2016  . GERD (gastroesophageal reflux disease) 06/24/2016  . Allergic rhinitis 06/08/2014  . DDD (degenerative disc disease), lumbar 06/08/2014  . Insomnia 12/07/2013  . Depression   . OA (osteoarthritis) 01/06/2013  . Loss of weight 07/13/2012  . Vascular dementia without behavioral disturbance 06/20/2012  . Hypothyroidism due to acquired atrophy of thyroid 04/15/2012  . Atrial fibrillation with rapid ventricular response (HCC) 04/12/2012  . Confusion 04/10/2012  . Essential hypertension 05/16/2011  . Anxiety disorder 05/16/2011    Past Surgical History:  Procedure Laterality Date  . ABDOMINAL HYSTERECTOMY    . BREAST  SURGERY    . EYE SURGERY    . TONSILLECTOMY      OB History    No data available       Home Medications    Prior to Admission medications   Medication Sig Start Date End Date Taking? Authorizing Provider  acetaminophen (TYLENOL) 325 MG tablet Take 2 tablets (650 mg total) by mouth every 6 (six) hours as needed. Patient not taking: Reported on 08/31/2016 04/15/12   Christiane Ha, MD  acetaminophen (TYLENOL) 500 MG tablet Take 500 mg by mouth 2 (two) times daily.    [provider]  ALPRAZolam Prudy Feeler) 0.25 MG tablet Take 1 tablet (0.25 mg total) by mouth 2 (two) times daily. 06/26/16   Maxie Barb, MD  aspirin 81 MG chewable tablet Chew 81 mg by mouth daily.    [provider]  Dextran 70-Hypromellose (ARTIFICIAL TEARS) 0.1-0.3 % SOLN Place 1 drop into both eyes every morning.     [provider]  diltiazem (CARDIZEM CD) 120 MG 24 hr capsule Take 120 mg by mouth daily. for angina.    [provider]  doxycycline (VIBRAMYCIN) 100 MG capsule Take 1 capsule (100 mg total) by mouth 2 (two) times daily. 09/04/16   Gilda Crease, MD  DULoxetine (CYMBALTA) 20 MG capsule Take 20-40 mg by mouth See admin instructions. 20 mg in the morning on Sun/Tues/Thurs/Sat and 40 mg on Mon/Wed/Fri    [provider]  fluticasone (FLONASE) 50 MCG/ACT nasal spray Place 2 sprays into both nostrils daily.  06/08/14   Sharon Seller, NP  HYDROcodone-acetaminophen (NORCO/VICODIN) 5-325 MG tablet Take one tablet by mouth every 8 hours as needed for pain Patient taking differently: Take 1 tablet by mouth every 8 (eight) hours as needed (for pain).  06/26/16   Maxie Barb, MD  ibuprofen (ADVIL,MOTRIN) 200 MG tablet Take 200 mg by mouth every 12 (twelve) hours as needed (for pain).     [provider]  levothyroxine (SYNTHROID, LEVOTHROID) 25 MCG tablet Take 1 tablet (25 mcg total) by mouth daily before breakfast. Patient taking  differently: Take 25 mcg by mouth at bedtime.  04/15/12   Christiane Ha, MD  lisinopril (PRINIVIL,ZESTRIL) 5 MG tablet Take 7.5 mg by mouth daily.    [provider]  Melatonin 3 MG CAPS By mouth qhs for sleep 11/17/13   Sharon Seller, NP  Menthol, Topical Analgesic, (BIOFREEZE) 4 % GEL Apply 1 application topically every 8 (eight) hours as needed (knee pain).    [provider]  metoprolol (TOPROL-XL) 100 MG 24 hr tablet Take 150 mg by mouth every morning. for HTN.    [provider]  Multiple Vitamin (DAILY-VITE) TABS Take 1 tablet by mouth daily.    [provider]  Multiple Vitamins-Minerals (THERA-M PO) Take 1 tablet by mouth every morning.    [provider]  ondansetron (ZOFRAN) 4 MG tablet Take 4 mg by mouth every 6 (six) hours as needed for nausea or vomiting.    [provider]  pantoprazole (PROTONIX) 20 MG tablet Take 20 mg by mouth at bedtime.     [provider]  psyllium (REGULOID) 0.52 g capsule Take 0.52 g by mouth at bedtime.    [provider]    Family History Family History  Problem Relation Age of Onset  . Lung cancer Sister   . CVA Neg Hx   . Diabetes Neg Hx   . Hypertension Neg Hx     Social History Social History  Substance Use Topics  . Smoking status: Never Smoker  . Smokeless tobacco: Never Used  . Alcohol use No     Allergies   Sulfa antibiotics; Ace inhibitors; Biaxin [clarithromycin]; Penicillins; Klor-con [potassium chloride]; and Cozaar [losartan potassium]   Review of Systems Review of Systems  Unable to perform ROS: Acuity of condition     Physical Exam Updated Vital Signs BP (!) 138/96   Pulse 85   Temp 98.6 F (37 C) (Temporal)   Resp 11   Ht 5\' 6"  (1.676 m)   Wt 72.6 kg (160 lb)   SpO2 93%   BMI 25.82 kg/m   Physical Exam  Constitutional: She appears well-developed and well-nourished. No distress.  HENT:  Head: Normocephalic.  Right Ear:  Hearing normal.  Left Ear: Hearing normal.  Nose: Sinus tenderness present. No nasal septal hematoma.  Mouth/Throat: Oropharynx is clear and moist and mucous membranes are normal.  Blood clot had left nare with contusions of the left side of face, periaortic orbital area, nose  Eyes: Conjunctivae and EOM are normal. Pupils are equal, round, and reactive to light.  Neck: Neck supple.  Cardiovascular: Regular rhythm, S1 normal and S2 normal.  Exam reveals no gallop and no friction rub.   No murmur heard. Pulmonary/Chest: Effort normal and breath sounds normal. No respiratory distress. She exhibits no tenderness.  Abdominal: Soft. Normal appearance and bowel sounds are normal. There is no hepatosplenomegaly. There is no tenderness. There is no rebound, no guarding, no tenderness  at McBurney's point and negative Murphy's sign. No hernia.  Musculoskeletal: Normal range of motion.  Neurological: She is alert. She has normal strength. No cranial nerve deficit or sensory deficit. Coordination normal.  Patient does not answer questions or follow commands. She is moving all 4 extremities.  Skin: Skin is warm, dry and intact. Ecchymosis noted. No rash noted. No cyanosis.  Psychiatric: She is noncommunicative.  Nursing note and vitals reviewed.    ED Treatments / Results  Labs (all labs ordered are listed, but only abnormal results are displayed) Labs Reviewed  COMPREHENSIVE METABOLIC PANEL - Abnormal; Notable for the following:       Result Value   Sodium 131 (*)    Chloride 97 (*)    Glucose, Bld 155 (*)    Total Protein 6.4 (*)    All other components within normal limits  CBC - Abnormal; Notable for the following:    WBC 13.5 (*)    All other components within normal limits  URINALYSIS, ROUTINE W REFLEX MICROSCOPIC - Abnormal; Notable for the following:    Protein, ur 100 (*)    Bacteria, UA FEW (*)    Squamous Epithelial / LPF 0-5 (*)    All other components within normal limits    I-STAT CHEM 8, ED - Abnormal; Notable for the following:    Sodium 132 (*)    Chloride 95 (*)    Glucose, Bld 154 (*)    Calcium, Ion 1.05 (*)    All other components within normal limits  I-STAT CG4 LACTIC ACID, ED - Abnormal; Notable for the following:    Lactic Acid, Venous 2.89 (*)    All other components within normal limits  CDS SEROLOGY  ETHANOL  PROTIME-INR  SAMPLE TO BLOOD BANK    EKG  EKG Interpretation None       Radiology Ct Head Wo Contrast  Result Date: 09/04/2016 CLINICAL DATA:  Status post unwitnessed fall, with deformity at the bridge of the nose and bruising about the left orbit and the chin. Epistaxis. Concern for head or cervical spine injury. Initial encounter. EXAM: CT HEAD WITHOUT CONTRAST CT MAXILLOFACIAL WITHOUT CONTRAST CT CERVICAL SPINE WITHOUT CONTRAST TECHNIQUE: Multidetector CT imaging of the head, cervical spine, and maxillofacial structures were performed using the standard protocol without intravenous contrast. Multiplanar CT image reconstructions of the cervical spine and maxillofacial structures were also generated. COMPARISON:  CT of the head and maxillofacial structures, and MRI of the brain, performed 08/31/2016, and CTA of the neck performed 06/24/2016 FINDINGS: CT HEAD FINDINGS Brain: No evidence of acute infarction, hemorrhage, hydrocephalus, extra-axial collection or mass lesion/mass effect. Prominence of the ventricles and sulci reflects moderate cortical volume loss. Mild cerebellar atrophy is noted. Diffuse periventricular and subcortical white matter change likely reflects small vessel ischemic microangiopathy. The brainstem and fourth ventricle are within normal limits. The basal ganglia are unremarkable in appearance. The cerebral hemispheres demonstrate grossly normal gray-white differentiation. No mass effect or midline shift is seen. Vascular: No hyperdense vessel or unexpected calcification. Skull: There is no evidence of fracture;  visualized osseous structures are unremarkable in appearance. Other: Soft tissue swelling is noted overlying the left frontal calvarium and tracking about the bridge of the nose, with minimal soft tissue air at the bridge of the nose. CT MAXILLOFACIAL FINDINGS Osseous: Blood is noted partially filling the maxillary sinuses bilaterally, suspicious for fractures to the medial walls of both maxillary sinuses. There is a mildly comminuted and depressed fracture involving both sides of  the nasal bone, with overlying soft tissue swelling. The mandible appears intact, aside from chronic flattening of the mandibular condylar heads bilaterally. The visualized dentition demonstrates no acute abnormality. Orbits: The orbits are intact bilaterally. Sinuses: As described above, there is partial opacification of the maxillary sinuses with blood. There is minimal partial opacification of the sphenoid sinus with low attenuation fluid. The remaining visualized paranasal sinuses and mastoid air cells are well-aerated. Soft tissues: Soft tissue swelling is noted overlying the frontal calvarium and about the bridge of the nose. Mild soft tissue swelling is noted about the left orbit. The parapharyngeal fat planes are preserved. The nasopharynx, oropharynx and hypopharynx are unremarkable in appearance. The visualized portions of the valleculae and piriform sinuses are grossly unremarkable. The parotid and submandibular glands are within normal limits. No cervical lymphadenopathy is seen. CT CERVICAL SPINE FINDINGS Alignment: There is grade 1 anterolisthesis of C4 on C5, reflecting underlying facet disease. Skull base and vertebrae: No acute fracture. No primary bone lesion or focal pathologic process. Soft tissues and spinal canal: No prevertebral fluid or swelling. No visible canal hematoma. Disc levels: Multilevel disc space narrowing is noted along the cervical spine, with scattered anterior and posterior disc osteophyte complexes.  Upper chest: The thyroid gland is unremarkable in appearance. The visualized lung apices are clear. Other: No additional soft tissue abnormalities are seen. IMPRESSION: 1. No evidence of traumatic intracranial injury. 2. Blood noted partially filling the maxillary sinuses bilaterally, suspicious for fractures of the medial walls of both maxillary sinuses. 3. Mildly comminuted and depressed fracture involving both sides of the nasal bone, with overlying soft tissue swelling. 4. No evidence of acute fracture or subluxation along the cervical spine. 5. Moderate cortical volume loss and diffuse small vessel ischemic microangiopathy. 6. Soft tissue swelling overlying the left frontal calvarium and about the left orbit. 7. Chronic flattening of the mandibular condylar heads bilaterally, reflecting temporomandibular joint disease. 8. Mild degenerative change along the cervical spine. Electronically Signed   By: Roanna Raider M.D.   On: 09/04/2016 05:50   Ct Cervical Spine Wo Contrast  Result Date: 09/04/2016 CLINICAL DATA:  Status post unwitnessed fall, with deformity at the bridge of the nose and bruising about the left orbit and the chin. Epistaxis. Concern for head or cervical spine injury. Initial encounter. EXAM: CT HEAD WITHOUT CONTRAST CT MAXILLOFACIAL WITHOUT CONTRAST CT CERVICAL SPINE WITHOUT CONTRAST TECHNIQUE: Multidetector CT imaging of the head, cervical spine, and maxillofacial structures were performed using the standard protocol without intravenous contrast. Multiplanar CT image reconstructions of the cervical spine and maxillofacial structures were also generated. COMPARISON:  CT of the head and maxillofacial structures, and MRI of the brain, performed 08/31/2016, and CTA of the neck performed 06/24/2016 FINDINGS: CT HEAD FINDINGS Brain: No evidence of acute infarction, hemorrhage, hydrocephalus, extra-axial collection or mass lesion/mass effect. Prominence of the ventricles and sulci reflects moderate  cortical volume loss. Mild cerebellar atrophy is noted. Diffuse periventricular and subcortical white matter change likely reflects small vessel ischemic microangiopathy. The brainstem and fourth ventricle are within normal limits. The basal ganglia are unremarkable in appearance. The cerebral hemispheres demonstrate grossly normal gray-white differentiation. No mass effect or midline shift is seen. Vascular: No hyperdense vessel or unexpected calcification. Skull: There is no evidence of fracture; visualized osseous structures are unremarkable in appearance. Other: Soft tissue swelling is noted overlying the left frontal calvarium and tracking about the bridge of the nose, with minimal soft tissue air at the bridge of the nose. CT  MAXILLOFACIAL FINDINGS Osseous: Blood is noted partially filling the maxillary sinuses bilaterally, suspicious for fractures to the medial walls of both maxillary sinuses. There is a mildly comminuted and depressed fracture involving both sides of the nasal bone, with overlying soft tissue swelling. The mandible appears intact, aside from chronic flattening of the mandibular condylar heads bilaterally. The visualized dentition demonstrates no acute abnormality. Orbits: The orbits are intact bilaterally. Sinuses: As described above, there is partial opacification of the maxillary sinuses with blood. There is minimal partial opacification of the sphenoid sinus with low attenuation fluid. The remaining visualized paranasal sinuses and mastoid air cells are well-aerated. Soft tissues: Soft tissue swelling is noted overlying the frontal calvarium and about the bridge of the nose. Mild soft tissue swelling is noted about the left orbit. The parapharyngeal fat planes are preserved. The nasopharynx, oropharynx and hypopharynx are unremarkable in appearance. The visualized portions of the valleculae and piriform sinuses are grossly unremarkable. The parotid and submandibular glands are within  normal limits. No cervical lymphadenopathy is seen. CT CERVICAL SPINE FINDINGS Alignment: There is grade 1 anterolisthesis of C4 on C5, reflecting underlying facet disease. Skull base and vertebrae: No acute fracture. No primary bone lesion or focal pathologic process. Soft tissues and spinal canal: No prevertebral fluid or swelling. No visible canal hematoma. Disc levels: Multilevel disc space narrowing is noted along the cervical spine, with scattered anterior and posterior disc osteophyte complexes. Upper chest: The thyroid gland is unremarkable in appearance. The visualized lung apices are clear. Other: No additional soft tissue abnormalities are seen. IMPRESSION: 1. No evidence of traumatic intracranial injury. 2. Blood noted partially filling the maxillary sinuses bilaterally, suspicious for fractures of the medial walls of both maxillary sinuses. 3. Mildly comminuted and depressed fracture involving both sides of the nasal bone, with overlying soft tissue swelling. 4. No evidence of acute fracture or subluxation along the cervical spine. 5. Moderate cortical volume loss and diffuse small vessel ischemic microangiopathy. 6. Soft tissue swelling overlying the left frontal calvarium and about the left orbit. 7. Chronic flattening of the mandibular condylar heads bilaterally, reflecting temporomandibular joint disease. 8. Mild degenerative change along the cervical spine. Electronically Signed   By: Roanna Raider M.D.   On: 09/04/2016 05:50   Dg Pelvis Portable  Result Date: 09/04/2016 CLINICAL DATA:  Fall EXAM: PORTABLE PELVIS 1-2 VIEWS COMPARISON:  None. FINDINGS: There is no evidence of pelvic fracture or diastasis. No pelvic bone lesions are seen. IMPRESSION: No hip fracture or pelvic diastasis. Electronically Signed   By: Deatra Robinson M.D.   On: 09/04/2016 05:13   Dg Chest Port 1 View  Result Date: 09/04/2016 CLINICAL DATA:  Fall EXAM: PORTABLE CHEST 1 VIEW COMPARISON:  Chest radiograph 06/24/2016  FINDINGS: Unchanged cardiomegaly. Lungs are clear. No pneumothorax or pleural effusion. No rib fractures. IMPRESSION: No active disease. Electronically Signed   By: Deatra Robinson M.D.   On: 09/04/2016 05:14   Ct Maxillofacial Wo Cm  Result Date: 09/04/2016 CLINICAL DATA:  Status post unwitnessed fall, with deformity at the bridge of the nose and bruising about the left orbit and the chin. Epistaxis. Concern for head or cervical spine injury. Initial encounter. EXAM: CT HEAD WITHOUT CONTRAST CT MAXILLOFACIAL WITHOUT CONTRAST CT CERVICAL SPINE WITHOUT CONTRAST TECHNIQUE: Multidetector CT imaging of the head, cervical spine, and maxillofacial structures were performed using the standard protocol without intravenous contrast. Multiplanar CT image reconstructions of the cervical spine and maxillofacial structures were also generated. COMPARISON:  CT of the  head and maxillofacial structures, and MRI of the brain, performed 08/31/2016, and CTA of the neck performed 06/24/2016 FINDINGS: CT HEAD FINDINGS Brain: No evidence of acute infarction, hemorrhage, hydrocephalus, extra-axial collection or mass lesion/mass effect. Prominence of the ventricles and sulci reflects moderate cortical volume loss. Mild cerebellar atrophy is noted. Diffuse periventricular and subcortical white matter change likely reflects small vessel ischemic microangiopathy. The brainstem and fourth ventricle are within normal limits. The basal ganglia are unremarkable in appearance. The cerebral hemispheres demonstrate grossly normal gray-white differentiation. No mass effect or midline shift is seen. Vascular: No hyperdense vessel or unexpected calcification. Skull: There is no evidence of fracture; visualized osseous structures are unremarkable in appearance. Other: Soft tissue swelling is noted overlying the left frontal calvarium and tracking about the bridge of the nose, with minimal soft tissue air at the bridge of the nose. CT MAXILLOFACIAL  FINDINGS Osseous: Blood is noted partially filling the maxillary sinuses bilaterally, suspicious for fractures to the medial walls of both maxillary sinuses. There is a mildly comminuted and depressed fracture involving both sides of the nasal bone, with overlying soft tissue swelling. The mandible appears intact, aside from chronic flattening of the mandibular condylar heads bilaterally. The visualized dentition demonstrates no acute abnormality. Orbits: The orbits are intact bilaterally. Sinuses: As described above, there is partial opacification of the maxillary sinuses with blood. There is minimal partial opacification of the sphenoid sinus with low attenuation fluid. The remaining visualized paranasal sinuses and mastoid air cells are well-aerated. Soft tissues: Soft tissue swelling is noted overlying the frontal calvarium and about the bridge of the nose. Mild soft tissue swelling is noted about the left orbit. The parapharyngeal fat planes are preserved. The nasopharynx, oropharynx and hypopharynx are unremarkable in appearance. The visualized portions of the valleculae and piriform sinuses are grossly unremarkable. The parotid and submandibular glands are within normal limits. No cervical lymphadenopathy is seen. CT CERVICAL SPINE FINDINGS Alignment: There is grade 1 anterolisthesis of C4 on C5, reflecting underlying facet disease. Skull base and vertebrae: No acute fracture. No primary bone lesion or focal pathologic process. Soft tissues and spinal canal: No prevertebral fluid or swelling. No visible canal hematoma. Disc levels: Multilevel disc space narrowing is noted along the cervical spine, with scattered anterior and posterior disc osteophyte complexes. Upper chest: The thyroid gland is unremarkable in appearance. The visualized lung apices are clear. Other: No additional soft tissue abnormalities are seen. IMPRESSION: 1. No evidence of traumatic intracranial injury. 2. Blood noted partially filling  the maxillary sinuses bilaterally, suspicious for fractures of the medial walls of both maxillary sinuses. 3. Mildly comminuted and depressed fracture involving both sides of the nasal bone, with overlying soft tissue swelling. 4. No evidence of acute fracture or subluxation along the cervical spine. 5. Moderate cortical volume loss and diffuse small vessel ischemic microangiopathy. 6. Soft tissue swelling overlying the left frontal calvarium and about the left orbit. 7. Chronic flattening of the mandibular condylar heads bilaterally, reflecting temporomandibular joint disease. 8. Mild degenerative change along the cervical spine. Electronically Signed   By: Roanna RaiderJeffery  Chang M.D.   On: 09/04/2016 05:50    Procedures Procedures (including critical care time)  Medications Ordered in ED Medications  Tdap (BOOSTRIX) injection 0.5 mL (0.5 mLs Intramuscular Given 09/04/16 0504)     Initial Impression / Assessment and Plan / ED Course  I have reviewed the triage vital signs and the nursing notes.  Pertinent labs & imaging results that were available during my care  of the patient were reviewed by me and considered in my medical decision making (see chart for details).     Patient presents to the emergency department for evaluation after a fall. Patient had an unwitnessed fall at assisted living. Patient has bruising, swelling of the face, bleeding from the nose. At arrival she was not answering questions appropriately. During the period of evaluation, however, she has now become communicative. She is able to follow commands. She does not have any specific complaints. Repeat evaluation does not reveal any concern for extremity injury including bilateral hips. CT head and cervical spine were negative. CT maxillofacial bones did show evidence of a nasal fracture, maxillary sinus fractures. Will empirically cover with doxycycline. Ocular examination does not reveal any evidence of globe injury. She does have a  mildly elevated lactic acid and white blood cell count. This is felt to be noninfectious. No infectious symptoms. Lab work otherwise unremarkable. Chest x-ray clear and no pneumonia. Urinalysis no signs of infection. This is all felt to be secondary to trauma.  Family was concerned about her recent falls. She has arthritis of both of her knees and they report that she has been having difficulty walking with her walker. She is in assisted living down there is concern that she might need skilled nursing facility. Will ask social work to review her case this morning.  Final Clinical Impressions(s) / ED Diagnoses   Final diagnoses:  Fall, initial encounter  Injury of head, initial encounter  Contusion of face, initial encounter  Closed fracture of nasal bone, initial encounter  Closed fracture of maxillary sinus, initial encounter (HCC)    New Prescriptions New Prescriptions   DOXYCYCLINE (VIBRAMYCIN) 100 MG CAPSULE    Take 1 capsule (100 mg total) by mouth 2 (two) times daily.     Gilda Crease, MD 09/04/16 [phone]

## 2016-09-04 NOTE — ED Notes (Signed)
Pt remains lethargic, will open her eyes and answer yes or no questions, then falls back to sleep. Pt has yet to become more alert. Family reports at baseline pt is a x 4, has full conversations. Dr. Ranae PalmsYelverton to bedside to assess patient.

## 2016-09-04 NOTE — ED Notes (Signed)
Family remains at bedside. Pt verbally response to RN, able to drink some sips of water.

## 2016-09-04 NOTE — ED Notes (Signed)
PT at bedside for evaluation.

## 2016-09-04 NOTE — ED Notes (Signed)
Pt transported to CT ?

## 2016-09-04 NOTE — ED Triage Notes (Signed)
BIB EMS from [facility] pt had unwitnessed fall, unknown LKW. Pt was covered in blood upon EMS arrival, Obvious deformity to bridge of nose, bruising to L eye and L chin. Bleeding from nose. Pt was initially A/OX4 but [facility] declined en route. Pt is nonverbal upon arrival. EDP at bedside.

## 2016-09-05 DIAGNOSIS — R296 Repeated falls: Secondary | ICD-10-CM

## 2016-09-05 DIAGNOSIS — W19XXXA Unspecified fall, initial encounter: Secondary | ICD-10-CM | POA: Diagnosis not present

## 2016-09-05 DIAGNOSIS — I4891 Unspecified atrial fibrillation: Secondary | ICD-10-CM | POA: Diagnosis not present

## 2016-09-05 DIAGNOSIS — R4701 Aphasia: Secondary | ICD-10-CM | POA: Diagnosis not present

## 2016-09-05 DIAGNOSIS — Z7982 Long term (current) use of aspirin: Secondary | ICD-10-CM | POA: Diagnosis not present

## 2016-09-05 DIAGNOSIS — I10 Essential (primary) hypertension: Secondary | ICD-10-CM | POA: Diagnosis not present

## 2016-09-05 DIAGNOSIS — S02401A Maxillary fracture, unspecified, initial encounter for closed fracture: Secondary | ICD-10-CM

## 2016-09-05 DIAGNOSIS — I471 Supraventricular tachycardia: Secondary | ICD-10-CM

## 2016-09-05 DIAGNOSIS — S022XXA Fracture of nasal bones, initial encounter for closed fracture: Secondary | ICD-10-CM | POA: Diagnosis not present

## 2016-09-05 DIAGNOSIS — R4182 Altered mental status, unspecified: Secondary | ICD-10-CM | POA: Diagnosis not present

## 2016-09-05 DIAGNOSIS — S0083XA Contusion of other part of head, initial encounter: Secondary | ICD-10-CM | POA: Diagnosis not present

## 2016-09-05 DIAGNOSIS — Z8673 Personal history of transient ischemic attack (TIA), and cerebral infarction without residual deficits: Secondary | ICD-10-CM | POA: Diagnosis not present

## 2016-09-05 LAB — CBC
HCT: 34.6 % — ABNORMAL LOW (ref 36.0–46.0)
Hemoglobin: 11.4 g/dL — ABNORMAL LOW (ref 12.0–15.0)
MCH: 28.9 pg (ref 26.0–34.0)
MCHC: 32.9 g/dL (ref 30.0–36.0)
MCV: 87.8 fL (ref 78.0–100.0)
PLATELETS: 279 10*3/uL (ref 150–400)
RBC: 3.94 MIL/uL (ref 3.87–5.11)
RDW: 14.6 % (ref 11.5–15.5)
WBC: 11.4 10*3/uL — AB (ref 4.0–10.5)

## 2016-09-05 LAB — BASIC METABOLIC PANEL
Anion gap: 11 (ref 5–15)
BUN: 8 mg/dL (ref 6–20)
CALCIUM: 8.8 mg/dL — AB (ref 8.9–10.3)
CO2: 26 mmol/L (ref 22–32)
CREATININE: 0.72 mg/dL (ref 0.44–1.00)
Chloride: 93 mmol/L — ABNORMAL LOW (ref 101–111)
Glucose, Bld: 123 mg/dL — ABNORMAL HIGH (ref 65–99)
Potassium: 3.5 mmol/L (ref 3.5–5.1)
SODIUM: 130 mmol/L — AB (ref 135–145)

## 2016-09-05 LAB — TROPONIN I
TROPONIN I: 0.14 ng/mL — AB (ref <0.03)
TROPONIN I: 0.16 ng/mL — AB (ref <0.03)
TROPONIN I: 0.11 ng/mL — AB (ref <0.03)

## 2016-09-05 MED ORDER — SODIUM CHLORIDE 0.9 % IV SOLN
INTRAVENOUS | Status: DC
  Administered 2016-09-05 – 2016-09-06 (×2): via INTRAVENOUS

## 2016-09-05 NOTE — Progress Notes (Signed)
MD called back and stated he will come to bedside to discuss with family   Kaylanie Capili Elige RadonBradley

## 2016-09-05 NOTE — Progress Notes (Signed)
Physical Therapy Treatment Patient Details Name: Ebony [name] MRN: [id number] DOB: 05-31-1931 Today's Date: 09/05/2016    History of Present Illness Pt is an 83 yo female seen in ED following a fall early this AM (09/04/16) resulting in a nasal fx (being tx conservatively). Pt has had 5 falls in the past week. Pt was in ED earlier this week following a fall out of her chair. Pt was admitted in April of this year with possible stroke. PMH significant for acute ischemia stroke 2/14, anxiety, OA, HLD, HTN, subdural hematoma with subarachnoid hemorrhage 3/13.      PT Comments    Pt was able to stand and pivot to the chair, but presents like a TBI Rancho level V.  She is slow to process, easily distracted by lines and her bed linen, and not very oriented.  She was unable to stand with RW, but once removed was able to stand and transfer to the chair with PT's physical assist.  Pt continues to be appropriate for SNF placement at discharge and would need two person assist, at this point, to safely attempt gait.    Follow Up Recommendations  SNF;Supervision/Assistance - 24 hour     Equipment Recommendations  3in1 (PT)    Recommendations for Other Services   NA     Precautions / Restrictions Precautions Precautions: Fall Precaution Comments: significant recent h/o multiple falls    Mobility  Bed Mobility Overal bed mobility: Needs Assistance Bed Mobility: Supine to Sit     Supine to sit: Max assist;HOB elevated     General bed mobility comments: Max assist to get pt EOB as PT had to initiate and complete movement of legs and trunk. Once EOB, pt repeatedly initiating movement to get back to supine in bed, lifting her left leg back up and pushing her trunk posteriorly and to the right towards her pillow.   Transfers Overall transfer level: Needs assistance Equipment used: Rolling walker (2 wheeled);None Transfers: Sit to/from [employer] Sit to Stand: Max assist;Mod  assist;From elevated surface Stand pivot transfers: Mod assist;From elevated surface       General transfer comment: Attepted to stand EOB multiple times with RW, PT needed to repeatedly place hands on RW and count to initiate stand.  Pt verbally reported she did not want to get up.  I reinforced that the MD wanted her to try to get up to the chair and removed the RW using bed pad for initiation at her hips she came up and moved over to the chair.  Mod assist for final stand and pivot to the chair.  Pt was max assist likely due to resistance upon initial attempts.   Ambulation/Gait             General Gait Details: unable to safely at this time with only one person assist.           Balance Overall balance assessment: Needs assistance Sitting-balance support: Feet supported;Bilateral upper extremity supported Sitting balance-Leahy Scale: Poor Sitting balance - Comments: posterior right lean, but I think this is more because pt is trying to get back in bed vs true seated balance deficit.    Standing balance support: Single extremity supported Standing balance-Leahy Scale: Poor Standing balance comment: mod assist to stand EOB.  Unable to stand with RW, but once removed and therapist positioned in front of pt, she was able to stand.  Cognition Arousal/Alertness: Awake/alert Behavior During Therapy: Flat affect Overall Cognitive Status: Impaired/Different from baseline Area of Impairment: Orientation;Attention;Memory;Following commands;Safety/judgement;Awareness;Problem solving;Rancho level               Rancho Levels of Cognitive Functioning Rancho BiographySeries.dkos Amigos Scales of Cognitive Functioning: Confused/inappropriate/non-agitated Orientation Level: Disoriented to;Time;Situation (was able to tell daughters' names, and Ebony [name]) Current Attention Level: Sustained (short periods of sustained, easily distracted) Memory: Decreased recall  of precautions;Decreased short-term memory Following Commands: Follows one step commands inconsistently;Follows one step commands with increased time Safety/Judgement: Decreased awareness of safety;Decreased awareness of deficits Awareness: Intellectual Problem Solving: Slow processing;Decreased initiation;Difficulty sequencing;Requires verbal cues;Requires tactile cues General Comments: Pt seems very slow to process, requires multi modal cues to initiate.  Able to tell me [name], but not why.  Picking at gown, bed sheets, wires.               Pertinent Vitals/Pain Pain Assessment: Faces Faces Pain Scale: Hurts even more Pain Location: head Pain Descriptors / Indicators: Aching Pain Intervention(s): Limited activity within patient's tolerance;Monitored during session;Repositioned;Patient requesting pain meds-RN notified           PT Goals (current goals can now be found in the care plan section) Acute Rehab PT Goals Patient Stated Goal: family wants her to return to normal Progress towards PT goals: Progressing toward goals    Frequency    Min 2X/week      PT Plan Current plan remains appropriate       AM-PAC PT "6 Clicks" Daily Activity  Outcome Measure  Difficulty turning over in bed (including adjusting bedclothes, sheets and blankets)?: Total Difficulty moving from lying on back to sitting on the side of the bed? : Total Difficulty sitting down on and standing up from a chair with arms (e.g., wheelchair, bedside commode, etc,.)?: Total Help needed moving to and from a bed to chair (including a wheelchair)?: A Lot Help needed walking in hospital room?: Total Help needed climbing 3-5 steps with a railing? : Total 6 Click Score: 7    End of Session Equipment Utilized During Treatment: Gait belt Activity Tolerance: Patient limited by pain;Other (comment) (limited by cognitive deficits) Patient left: in chair;with call bell/phone within reach;with  chair alarm set Nurse Communication: Mobility status PT Visit Diagnosis: Unsteadiness on feet (R26.81);Muscle weakness (generalized) (M62.81);History of falling (Z91.81);Repeated falls (R29.6);Difficulty in walking, not elsewhere classified (R26.2)     Time: 1130-1209 PT Time Calculation (min) (ACUTE ONLY): 39 min  Charges:  $Therapeutic Activity: 38-52 mins          Ebony [name], PT, DPT [phone]            09/05/2016, 12:21 PM

## 2016-09-05 NOTE — Progress Notes (Signed)
Pt family expressed concerns for pt not drinking. Explained that RN provided water and an orange juice during breakfast/medication pass. Pt is voiding, using a purewick catheter. Family requesting IV fluids. Informed MD, awaiting call back   Ebony [name]

## 2016-09-05 NOTE — H&P (Signed)
Internal Medicine Attending Admission Note Date: 09/05/2016  Patient name: Ebony [name] Medical record number: [id number] Date of birth: [birth year] Age: 83 y.o. Gender: female  I saw and evaluated the patient. I reviewed the resident's note and I agree with the resident's findings and plan as documented in the resident's note.  Chief Complaint(s): Fall with subsequent blunted affect  History - key components related to admission:  Ebony [name] is an 83 year old woman with a history of hypertension, hyperlipidemia, hypothyroidism, depression, prior cerebrovascular accident, history of subarachnoid hemorrhage and subdural hematoma and frequent falls currently residing in assisted living facility who presents after a fall resulting in facial trauma and a blunted affect. Apparently she's had an increase in her falls over the previous week. On the day of admission she apparently had another fall and was found to have significant facial trauma as a result. She was brought to the emergency department for further evaluation. While in the emergency department she was noted to have a change in her degree of interactiveness according to her daughters. Her affect was much more blunted and she was not responsive to questions but would stare blankly. She was therefore admitted to the internal medicine teaching service for further evaluation and care.  When seen on rounds the morning after admission she would answer yes and no questions but her responses were limited to one word. Although this is an improvement over yesterday's examination it apparently remains a change from her baseline. She does deny any significant facial pain at this time and is without other complaints. She has been evaluated by physical therapy, and not unsurprisingly given her recent spout of falls, recommends that she spend some time in a skilled nursing facility to work on rehabilitation prior to returning to her assisted living  facility.  Physical Exam - key components related to admission:  Vitals:   09/05/16 0304 09/05/16 0512 09/05/16 0806 09/05/16 1204  BP: (!) 141/87 137/87 (!) 147/80 (!) 146/86  Pulse: 78 (!) 51 94 83  Resp:  18 19   Temp:  99.5 F (37.5 C)    TempSrc:  Oral    SpO2:  94% 96% 95%  Weight:      Height:       Gen.: Well-developed, well-nourished, woman sitting comfortably in bed in no acute distress. She is able to answer with one words but her affect is otherwise blunted. HEENT: Ecchymoses around both eyes with a small hematoma on the bridge of her nose. Neuro: Alert, follows commands, but only answers in one or 2 word responses.  Lab results:  Basic Metabolic Panel:  Recent Labs  40/98/1105/08/17 0453 09/04/16 0458 09/05/16 0649  NA 131* 132* 130*  K 4.0 4.1 3.5  CL 97* 95* 93*  CO2 25  --  26  GLUCOSE 155* 154* 123*  BUN 11 13 8   CREATININE 0.85 0.70 0.72  CALCIUM 9.2  --  8.8*   Liver Function Tests:  Recent Labs  09/04/16 0453  AST 32  ALT 18  ALKPHOS 76  BILITOT 0.7  PROT 6.4*  ALBUMIN 3.6   CBC:  Recent Labs  09/04/16 0453 09/04/16 0458 09/05/16 0649  WBC 13.5*  --  11.4*  HGB 12.0 12.6 11.4*  HCT 37.2 37.0 34.6*  MCV 88.6  --  87.8  PLT 266  --  279   Cardiac Enzymes:  Recent Labs  09/05/16 0127 09/05/16 0649 09/05/16 1123  TROPONINI 0.14* 0.16* 0.11*   Coagulation:  Recent Labs  09/04/16  0453  INR 0.95   Alcohol Level:  Recent Labs  09/04/16 0453  ETH <5   Urinalysis:  Clear, yellow, specific gravity 1.010, pH 6.0, protein 100, nitrite negative, leukocytes negative, 0-5 red blood cells per high-power field, 0-5 white blood cells per high-power field.  Misc. Labs:  Lactic acid 2.89 -> 1.1  Imaging results:   Radiography not personally reviewed but the radiologist's interpretation was reviewed.  Other results:  EKG: Normal sinus rhythm at 85 bpm, normal axis, first-degree AV block, no significant Q waves, LVH by voltage  in aVL, good R wave progression, 1 mm ST depression in inferolateral leads, other than the LVH there is no significant change from the previous ECG on 08/31/2016.  Rhythm strips: SVT of unclear etiology for a few seconds at various points throughout the night.  Assessment & Plan by Problem:  Ms. Assefa is an 83 year old woman with a history of hypertension, hyperlipidemia, hypothyroidism, depression, prior cerebrovascular accident, history of subarachnoid hemorrhage and subdural hematoma and frequent falls currently residing in assisted living facility who presents after a fall resulting in facial trauma and a blunted affect. The circumstances of the trauma remains somewhat unclear and may have been related to a mechanical fall, apparently which she's had several of recently, or a syncopal event related to an arrhythmia. Given her history and telemetry strips a supraventricular tachycardia with decreased perfusion to the brain could potentially have explained this. Her mild troponin leak could also be a result of a supraventricular tachycardia with demand ischemia. The relatively flat and low level troponinemia makes an acute myocardial infarction unlikely, especially given the fact there are no changes on ECG from baseline. It is unlikely that she had a TIA or CVA resulting in global hypoperfusion and a syncopal event. Her blunted affect is most likely a result of a concussion from the trauma of hitting her head on the floor during the fall or syncopal event. It is encouraging her head imaging is not suggestive of acute brain injury and her interactiveness is improving over the last 18 hours. If this supraventricular tachycardia is in fact atrial fibrillation she is a very poor candidate for anticoagulation given her frequency of falls, history of subarachnoid hemorrhage, and history of subdural hematoma. Unquestionably, she would benefit from more physical therapy and intensive gait training to avoid  further falls and recurrent trauma.  1) Presumed fall: While in the hospital she will receive physical therapy. In addition, she will be discharged to a skilled nursing facility for further rehabilitation before considering returning to her assisted living facility situation. We will slowly wean her benzodiazepine given the risk of this medication in the elderly, especially those with falls. We will also reassess the necessity of her narcotic pain medication and see if safer analgesics may be a better alternative. We will continue to monitor on telemetry to assess for episodes of supraventricular tachycardia. She will remain on her beta blocker during the hospitalization and telemetric monitoring. If needed, we may slightly increase the dose of her nodal blocker. Finally, we will continue to follow serial neuro examinations looking for improvement in her interactiveness towards baseline as she recovers from her presumed concussion.  2) Disposition: We will begin working on placement at a skilled nursing facility. It is unlikely this will be arranged to over the weekend and I suspect her discharge will be delayed until at least Monday. That gives [place] more time to perform serial assessments as noted above.

## 2016-09-05 NOTE — Progress Notes (Signed)
   Subjective: Harriett Sineancy has improved over the interval. She is still very delayed to respond and does not always respond, but was able to verbalize and answer most yes/no questions. She denies any current plan. We discussed the plan for outpatient follow-up with plastic surgery regarding her maxillary sinus and nasal fracture.   Objective:  Vital signs in last 24 hours: Vitals:   09/05/16 0158 09/05/16 0304 09/05/16 0512 09/05/16 0806  BP: (!) 153/84 (!) 141/87 137/87 (!) 147/80  Pulse: 81 78 (!) 51 94  Resp:   18 19  Temp:   99.5 F (37.5 C)   TempSrc:   Oral   SpO2:   94% 96%  Weight:      Height:       Physical Exam  Constitutional: She is well-developed, well-nourished, and in no distress.  HENT:  Traumatic with extensive bruising around the eyes and across the nasal bridge, swelling decreased compared to yesterday on admission.   Cardiovascular: Intact distal pulses.   Abnormal pulse, irregular rhythm   Pulmonary/Chest: Effort normal and breath sounds normal. She has no wheezes. She has no rales.  Abdominal: Soft. Bowel sounds are normal. She exhibits no distension. There is no tenderness.  Musculoskeletal: She exhibits no edema.  Neurological: She is alert. No cranial nerve deficit.  Able to answer yes/no questions, and follow commands.  Skin: Skin is warm and dry.    Assessment/Plan:  Principal Problem:   Maxillary sinus fracture [facility](HCC) Active Problems:   Fall   Essential hypertension   Expressive aphasia   Recurrent falls   Closed fracture of nasal bone   Altered mental status  1. Nasal and Maxillary Sinus Fracture. Mrs. Mclamb is a 83 y.o. Female who presented to the ED after an unwitnessed traumatic fall at her nursing home this morning. CT head, Cervical Spine, and Maxillofacial in the ED illustrated no evidence of traumatic intracranial injury, fracture of the medial walls of both maxillary sinuses, and mildly comminuted and depressed fracture involving both  sides of the nasal bone. Plastic surgery was contacted. Dr. Ulice Boldillingham recommended no further intervention currently. Recommended outpatient follow-up in 1-2wks.   2. Recurrent Falls. Mrs. Howat has had multiple falls since April 2018. The presenting fall was unwitnessed, and the etiology unknown. Cardiac eitology could not be ruled out, initial troponin 0.04 which trended up to 0.16. CT head on 09/04/2016 revealed no acute intracranial trauma or ischemia. Her medication list did contain medications that could contribute to her increasing fall frequency, including Xanax and Norco. - Xanax being held - PT recommends SNF - ECG show sinus rhythm with frequent PACs. However, telemetry over the interval did reveal SVT. Difficult to assess if this is A-Fib or not. She is currently on Metoprolol and not a candidate for anticoagulation due to recurrent falls and history of SAH.   3. Altered Mental Status. Mrs. Keadle was minimally interactive on presentation, but has improved over night. CT of the head on admission illustrated no acute intracranial process. As she continues to improve and have a normal neurological exam with the exception of a left facial droop, her AMS is not likely to be due to an acute stroke. More likely to be due to a concussion considering the extent of the traumatic fall.    Diet:Thin liquids IVF:N/A DVT ZOX:[id number]:Lovenox   Dispo: Anticipated discharge in approximately 0 day(s).   Levora DredgeHelberg, Kambrie Eddleman, MD 09/05/2016, 10:56 AM Pager: My Pager: [phone]

## 2016-09-05 NOTE — Progress Notes (Signed)
Paged that family was at bedside with questions. Discussed next steps for nasal and maxillary sinus fractures, possible A-fib but not a candidate for anticoagulation due to history of falls, and placement to a SNF. Comfortable with the current plan. Asking to begin IV fluids due to the lack of oral intake. Agree that this is an appropriate request, started at 100 mL/hr.

## 2016-09-05 NOTE — Progress Notes (Signed)
Internal Medicine Attending  Date: 09/05/2016  Patient name: Ebony [name] Medical record number: [id number] Date of birth: [birth year] Age: 83 y.o. Gender: female  I saw and evaluated the patient. I reviewed the resident's note by Dr. Caron PresumeHelberg and I agree with the resident's findings and plans as documented in his progress note.  Please see my H&P dated 09/05/2016 for the specifics of my evaluation, assessment, plan from earlier in the day.

## 2016-09-05 NOTE — Progress Notes (Signed)
MD at bedside, pt answering yes no questions verbally. Pt states she is not in pain  Ebony [name] RadonBradley

## 2016-09-06 DIAGNOSIS — I471 Supraventricular tachycardia: Secondary | ICD-10-CM | POA: Diagnosis not present

## 2016-09-06 DIAGNOSIS — W19XXXA Unspecified fall, initial encounter: Secondary | ICD-10-CM | POA: Diagnosis not present

## 2016-09-06 DIAGNOSIS — I10 Essential (primary) hypertension: Secondary | ICD-10-CM | POA: Diagnosis not present

## 2016-09-06 DIAGNOSIS — R296 Repeated falls: Secondary | ICD-10-CM | POA: Diagnosis not present

## 2016-09-06 DIAGNOSIS — S02401A Maxillary fracture, unspecified, initial encounter for closed fracture: Secondary | ICD-10-CM | POA: Diagnosis not present

## 2016-09-06 LAB — MRSA PCR SCREENING: MRSA BY PCR: NEGATIVE

## 2016-09-06 MED ORDER — LISINOPRIL 10 MG PO TABS
10.0000 mg | ORAL_TABLET | Freq: Every day | ORAL | Status: DC
  Administered 2016-09-06: 10 mg via ORAL
  Filled 2016-09-06 (×2): qty 1

## 2016-09-06 MED ORDER — METOPROLOL SUCCINATE ER 50 MG PO TB24
150.0000 mg | ORAL_TABLET | ORAL | Status: DC
  Administered 2016-09-07: 150 mg via ORAL
  Filled 2016-09-06: qty 1

## 2016-09-06 NOTE — Care Management CC44 (Signed)
Condition Code 44 Documentation Completed  Patient Details  Name: Ebony [name] MRN: [id number] Date of Birth: 1931/07/22   Condition Code 44 given:  Yes Patient signature on Condition Code 44 notice:  Yes Documentation of 2 MD's agreement:  Yes Code 44 added to claim:  Yes    Cherylann ParrClaxton, Aneth Schlagel S, RN 09/06/2016, 12:12 PM

## 2016-09-06 NOTE — Progress Notes (Signed)
Physical Therapy Treatment Patient Details Name: Ebony [name] MRN: [id number] DOB: October 01, 1931 Today's Date: 09/06/2016    History of Present Illness Pt is an 83 yo female seen in ED following a fall early this AM (09/04/16) resulting in a nasal fx (being tx conservatively). Pt has had 5 falls in the past week. Pt was in ED earlier this week following a fall out of her chair. Pt was admitted in April of this year with possible stroke. PMH significant for acute ischemia stroke 2/14, anxiety, OA, HLD, HTN, subdural hematoma with subarachnoid hemorrhage 3/13.  Pt has been diagnosed with a possible concussion although no acute findings were visualized on CT or MRI.     PT Comments    Contacted by CSW to reevaluate pt for possibility to return home with HHPT. Pt met sitting up in recliner at the start of this session more alert and able to participate. Pt continues to have slow processing and requires redirection onto tasks with gait in hallway. Pt continues to demonstrate short term memory loss and slower processing. However, significant improvement in gait, balance and transfers noted this session with Min guard required for majority of her mobility. Pt may still require SNF due to current cognitive deficits, but mobility wise is improving. Pt will continue to require 24hr supervision if she does return to ALF to ensure safety and to reduce falls risks.    Follow Up Recommendations  SNF;Supervision/Assistance - 24 hour;Other (comment) (If not admitted to SNF, will require HHPT, OT, ST & RN)     Equipment Recommendations  3in1 (PT)    Recommendations for Other Services       Precautions / Restrictions Precautions Precautions: Fall Precaution Comments: significant recent h/o multiple falls Restrictions Weight Bearing Restrictions: No    Mobility  Bed Mobility Overal bed mobility: Needs Assistance Bed Mobility: Sit to Supine       Sit to supine: Min guard   General bed mobility  comments: Min guard, pt is able to lay back down and positiong self. Requires verbal cues and increased time to process  Transfers Overall transfer level: Needs assistance Equipment used: Rolling walker (2 wheeled) Transfers: Sit to/from Stand Sit to Stand: Min guard         General transfer comment: Min guard from recliner with verbal cue to stand up. Pt continues to take increased time to process cue and stand up  Ambulation/Gait Ambulation/Gait assistance: Min guard Ambulation Distance (Feet): 150 Feet Assistive device: Rolling walker (2 wheeled) Gait Pattern/deviations: Step-through pattern;Decreased step length - right;Decreased step length - left;Steppage;Wide base of support Gait velocity: decreased Gait velocity interpretation: Below normal speed for age/gender General Gait Details: Cues for positioning within RW and for staying on task with gait distance. Pt requires multiple cues to turn safety around and is easily distracted by others moving about her. May benefit form lower stimulous environment which will be available in her room at ALF.    Stairs            Wheelchair Mobility    Modified Rankin (Stroke Patients Only)       Balance Overall balance assessment: Needs assistance Sitting-balance support: Feet supported;No upper extremity supported Sitting balance-Leahy Scale: Good     Standing balance support: Bilateral upper extremity supported Standing balance-Leahy Scale: Poor Standing balance comment: reliant on Rw for stability in standing  Cognition Arousal/Alertness: Awake/alert Behavior During Therapy: Flat affect Overall Cognitive Status: Impaired/Different from baseline Area of Impairment: Orientation;Attention;Memory;Following commands;Safety/judgement;Awareness;Problem solving                 Orientation Level: Disoriented to;Time;Situation Current Attention Level: Sustained (continues to be  easily distracted) Memory: Decreased recall of precautions;Decreased short-term memory (unable to recall room number) Following Commands: Follows one step commands inconsistently;Follows one step commands with increased time Safety/Judgement: Decreased awareness of safety;Decreased awareness of deficits Awareness: Intellectual Problem Solving: Slow processing;Requires verbal cues General Comments: Pt continues to have slow processing. Responds to verbal cues, but requires increased time to initate. Decreased short term memory and very easily distracted when outside of room.       Exercises      General Comments General comments (skin integrity, edema, etc.): Pt assisted to bathroom, was min guard for positioning on commode and standing up. Once she left the bathroom, pt began urinating on the floor and required assistance cleaning up and changing gown and socks.       Pertinent Vitals/Pain Pain Assessment: Faces Faces Pain Scale: Hurts a little bit Pain Location: head Pain Descriptors / Indicators: Aching Pain Intervention(s): Monitored during session    Home Living                      Prior Function            PT Goals (current goals can now be found in the care plan section) Acute Rehab PT Goals Patient Stated Goal: family wants her to return to normal Progress towards PT goals: Progressing toward goals    Frequency    Min 2X/week      PT Plan Current plan remains appropriate;Other (comment) (If not admitted to SNF will need HHPT, OT, ST and RN. )    Co-evaluation              AM-PAC PT "6 Clicks" Daily Activity  Outcome Measure  Difficulty turning over in bed (including adjusting bedclothes, sheets and blankets)?: A Little Difficulty moving from lying on back to sitting on the side of the bed? : A Little Difficulty sitting down on and standing up from a chair with arms (e.g., wheelchair, bedside commode, etc,.)?: A Little Help needed moving to  and from a bed to chair (including a wheelchair)?: A Little Help needed walking in hospital room?: A Little Help needed climbing 3-5 steps with a railing? : A Lot 6 Click Score: 17    End of Session Equipment Utilized During Treatment: Gait belt Activity Tolerance: Patient tolerated treatment well;Patient limited by fatigue Patient left: in bed;with call bell/phone within reach;with family/visitor present Nurse Communication: Mobility status PT Visit Diagnosis: Unsteadiness on feet (R26.81);Muscle weakness (generalized) (M62.81);History of falling (Z91.81);Repeated falls (R29.6);Difficulty in walking, not elsewhere classified (R26.2)     Time: 0938-1829 PT Time Calculation (min) (ACUTE ONLY): 36 min  Charges:  $Gait Training: 8-22 mins $Therapeutic Activity: 8-22 mins                    G Codes:       Scheryl Marten PT, DPT  [phone]    Jacqulyn Liner Sloan Leiter 09/06/2016, 4:19 PM

## 2016-09-06 NOTE — Care Management Obs Status (Signed)
MEDICARE OBSERVATION STATUS NOTIFICATION   Patient Details  Name: Ebony [name] MRN: [id number] Date of Birth: 11-05-1931   Medicare Observation Status Notification Given:  Yes    Cherylann ParrClaxton, Brady Schiller S, RN 09/06/2016, 12:12 PM

## 2016-09-06 NOTE — [place] FL2 (Signed)
[place] MEDICAID FL2 LEVEL OF CARE SCREENING TOOL     IDENTIFICATION  Patient Name: Ebony [name] Birthdate: 1931-03-12 Sex: female Admission Date (Current Location): 09/04/2016  [facility] and [place] Number:  Producer, television/film/video and Address:  [facility] [place], [address] [place], [place] [address]      Provider Number: [id number]  Attending Physician Name and Address:  Doneen Poisson, MD  Relative Name and Phone Number:  Maye Hides, [phone]    Current Level of Care: Hospital Recommended Level of Care: Skilled Nursing Facility Prior Approval Number:    Date Approved/Denied:   PASRR Number: [id number]  Discharge Plan: SNF    Current Diagnoses: Patient Active Problem List   Diagnosis Date Noted  . Paroxysmal supraventricular tachycardia (HCC)   . Recurrent falls 09/04/2016  . Maxillary sinus fracture (HCC) 09/04/2016  . Closed fracture of nasal bone 09/04/2016  . Altered mental status 09/04/2016  . Acute encephalopathy   . Expressive aphasia   . Ischemic stroke (HCC) 06/24/2016  . Depression with anxiety 06/24/2016  . Hypothyroid 06/24/2016  . GERD (gastroesophageal reflux disease) 06/24/2016  . Allergic rhinitis 06/08/2014  . DDD (degenerative disc disease), lumbar 06/08/2014  . Insomnia 12/07/2013  . Depression   . OA (osteoarthritis) 01/06/2013  . Loss of weight 07/13/2012  . Vascular dementia without behavioral disturbance 06/20/2012  . Hypothyroidism due to acquired atrophy of thyroid 04/15/2012  . Atrial fibrillation with rapid ventricular response (HCC) 04/12/2012  . Confusion 04/10/2012  . Fall 05/16/2011  . Essential hypertension 05/16/2011  . Anxiety disorder 05/16/2011    Orientation RESPIRATION BLADDER Height & Weight     Time, Situation  Normal Incontinent Weight: 149 lb (67.6 kg) (bed) Height:  5\' 2"  (157.5 cm)  BEHAVIORAL SYMPTOMS/MOOD NEUROLOGICAL BOWEL NUTRITION STATUS      Continent Diet (Heart  Room)  AMBULATORY STATUS COMMUNICATION OF NEEDS Skin   Limited Assist Verbally Normal                       Personal Care Assistance Level of Assistance  Bathing, Feeding, Dressing Bathing Assistance: Limited assistance Feeding assistance: Independent Dressing Assistance: Limited assistance     Functional Limitations Info  Sight, Hearing, Speech Sight Info: Adequate Hearing Info: Adequate Speech Info: Adequate    SPECIAL CARE FACTORS FREQUENCY  PT (By licensed PT), OT (By licensed OT)     PT Frequency: 5x wk OT Frequency: 5x wk            Contractures Contractures Info: Not present    Additional Factors Info  Code Status, Allergies Code Status Info: DNR Allergies Info: SULFA ANTIBIOTICS, ACE INHIBITORS, BIAXIN CLARITHROMYCIN, PENICILLINS, KLOR-CON POTASSIUM CHLORIDE, COZAAR LOSARTAN POTASSIUM           Current Medications (09/06/2016):  This is the current hospital active medication list Current Facility-Administered Medications  Medication Dose Route Frequency Provider Last Rate Last Dose  . acetaminophen (TYLENOL) tablet 650 mg  650 mg Oral Q6H PRN Gwynn Burly, DO   650 mg at 09/06/16 8469   Or  . acetaminophen (TYLENOL) suppository 650 mg  650 mg Rectal Q6H PRN Gwynn Burly, DO      . aspirin chewable tablet 81 mg  81 mg Oral Daily Gwynn Burly, DO   81 mg at 09/06/16 0950  . DULoxetine (CYMBALTA) DR capsule 20 mg  20 mg Oral Once per day on Sun Tue Thu Sat Gwynn Burly, DO   20 mg at  09/06/16 0952   And  . [START ON 09/07/2016] DULoxetine (CYMBALTA) DR capsule 40 mg  40 mg Oral Once per day on Mon Wed Fri Wallace, Andrew, DO      . enoxaparin (LOVENOX) injection 40 mg  40 mg Subcutaneous Q24H Gwynn BurlyWallace, Andrew, DO   40 mg at 09/05/16 1716  . fluticasone (FLONASE) 50 MCG/ACT nasal spray 2 spray  2 spray Each Nare Daily Gwynn BurlyWallace, Andrew, DO   2 spray at 09/06/16 [phone]  . HYDROcodone-acetaminophen (NORCO/VICODIN) 5-325 MG per tablet 1 tablet  1 tablet  Oral Q6H PRN Gwynn BurlyWallace, Andrew, DO   1 tablet at 09/05/16 1213  . levothyroxine (SYNTHROID, LEVOTHROID) tablet 25 mcg  25 mcg Oral QAC breakfast Gwynn BurlyWallace, Andrew, DO   25 mcg at 09/06/16 [phone]  . lisinopril (PRINIVIL,ZESTRIL) tablet 10 mg  10 mg Oral Daily John Giovanniathore, Vasundhra, MD   10 mg at 09/06/16 0951  . [START ON 09/07/2016] metoprolol succinate (TOPROL-XL) 24 hr tablet 150 mg  150 mg Oral Etter [id number] Rathore, Vasundhra, MD      . MUSCLE RUB CREA 1 application  1 application Topical Q8H PRN Gwynn BurlyWallace, Andrew, DO      . pantoprazole (PROTONIX) EC tablet 20 mg  20 mg Oral QHS Gwynn BurlyWallace, Andrew, DO   20 mg at 09/05/16 2221  . polyethylene glycol (MIRALAX / GLYCOLAX) packet 17 g  17 g Oral Daily PRN Gwynn BurlyWallace, Andrew, DO      . polyvinyl alcohol (LIQUIFILM TEARS) 1.4 % ophthalmic solution 1 drop  1 drop Both Eyes q morning - 10a Gwynn BurlyWallace, Andrew, DO   1 drop at 09/06/16 0953  . sodium chloride flush (NS) 0.9 % injection 3 mL  3 mL Intravenous Q12H Gwynn BurlyWallace, Andrew, DO   3 mL at 09/05/16 1000     Discharge Medications: Please see discharge summary for a list of discharge medications.  Relevant Imaging Results:  Relevant Lab Results:   Additional Information SS#  Althea CharonAshley C Samya Siciliano, LCSW

## 2016-09-06 NOTE — Clinical Social Work Placement (Signed)
   CLINICAL SOCIAL WORK PLACEMENT  NOTE  Date:  09/06/2016  Patient Details  Name: Ebony [name] MRN: [id number] Date of Birth: [birth year]  Clinical Social Work is seeking post-discharge placement for this patient at the Skilled  Nursing Facility level of care (*CSW will initial, date and re-position this form in  chart as items are completed):  Yes   Patient/family provided with [facility] Clinical Social Work Department's list of facilities offering this level of care within the geographic area requested by the patient (or if unable, by the patient's family).  Yes   Patient/family informed of their freedom to choose among providers that offer the needed level of care, that participate in Medicare, Medicaid or managed care program needed by the patient, have an available bed and are willing to accept the patient.  Yes   Patient/family informed of [facility]'s ownership interest in [facility] and [facility], as well as of the fact that they are under no obligation to receive care at these facilities.  PASRR submitted to EDS on 09/06/16     PASRR number received on 09/06/16     Existing PASRR number confirmed on       FL2 transmitted to all facilities in geographic area requested by pt/family on 09/06/16     FL2 transmitted to all facilities within larger geographic area on       Patient informed that his/her managed care company has contracts with or will negotiate with certain facilities, including the following:            Patient/family informed of bed offers received.  Patient chooses bed at       Physician recommends and patient chooses bed at      Patient to be transferred to   on  .  Patient to be transferred to facility by       Patient family notified on   of transfer.  Name of family member notified:        PHYSICIAN       Additional Comment:    _______________________________________________ Dominic PeaJeneya G Amarea Macdowell, LCSW 09/06/2016, 5:16 PM

## 2016-09-06 NOTE — Progress Notes (Signed)
Subjective: Patient was resting comfortably in bed watching the television. She reported feeling well and was talking a lot more today. Denied having any complaints. Stated she was hungry and wanted to eat breakfast. AAO 3.  Telemetry: First-degree AV block and PACs seen.  Objective:  Vital signs in last 24 hours: Vitals:   09/05/16 1300 09/05/16 2048 09/06/16 0353 09/06/16 0511  BP: (!) 143/88 (!) 171/93 (!) 179/76 (!) 169/86  Pulse: 76 73 64 83  Resp: 18 16  17   Temp: 98.5 F (36.9 C) 98.8 F (37.1 C)  99.3 F (37.4 C)  TempSrc: Oral Oral  Oral  SpO2: 94% 97%  95%  Weight:    149 lb (67.6 kg)  Height:       Physical Exam  Constitutional: She is well-developed, well-nourished, and in no distress.  HENT:  Traumatic with extensive bruising around the eyes and across the nasal bridge, swelling decreased compared to date of admission.   Cardiovascular: Intact distal pulses.   Abnormal pulse, irregular rhythm   Pulmonary/Chest: Effort normal and breath sounds normal. She has no wheezes. She has no rales.  Abdominal: Soft. Bowel sounds are normal. She exhibits no distension. There is no tenderness.  Musculoskeletal: She exhibits no edema.  Neurological: She is alert. No cranial nerve deficit.  Able to answer yes/no questions, and follow commands.  Skin: Skin is warm and dry.    Assessment/Plan:  Principal Problem:   Maxillary sinus fracture (HCC) Active Problems:   Fall   Essential hypertension   Expressive aphasia   Recurrent falls   Closed fracture of nasal bone   Altered mental status   Paroxysmal supraventricular tachycardia (HCC)  Nasal and Maxillary Sinus Fracture. Ebony [name] is a 83 y.o. Female who presented to the ED after an unwitnessed traumatic fall at her nursing home this morning. CT head, Cervical Spine, and Maxillofacial in the ED illustrated no evidence of traumatic intracranial injury, fracture of the medial walls of both maxillary sinuses, and  mildly comminuted and depressed fracture involving both sides of the nasal bone. Plastic surgery was contacted. Dr. Ulice Boldillingham recommended no further intervention currently. Recommended outpatient follow-up in 1-2wks.   Recurrent Falls. Ebony [name] has had multiple falls since April 2018. The presenting fall was unwitnessed, and the etiology unknown. Cardiac etiology could not be ruled out, troponin peaked at 0.16 and then trended down. First-degree AV block seen on telemetry today. CT head on 09/04/2016 revealed no acute intracranial trauma or ischemia. Her medication list did contain other medications that could contribute to her increasing fall frequency, including Xanax and Norco. - Xanax being held - Continue home metoprolol  - PT recommends SNF. Bed will be available tomorrow.   Altered Mental Status (resolved). Ebony [name] was minimally interactive on presentation, but has improved now. CT of the head on admission illustrated no acute intracranial process. As she continues to improve and have a normal neurological exam with the exception of a very mild left facial droop, her AMS is not likely to be due to an acute stroke. More likely to be due to a concussion considering the extent of the traumatic fall. Today, she is doing much better (AAO 3) and much more interactive.  HTN: SBP in 160s-170s and DBP in the 70s-90s. - Continue home metoprolol XL 150 mg daily - Increase dose of home lisinopril from 7.5 mg daily to 10 mg daily   Diet:Thin liquids IVF:N/A DVT WGN:[id number]:Lovenox   Dispo: Anticipated discharge in approximately 1 day(s).  John Giovanni, MD 09/06/2016, 7:29 AM Pager: My Pager: [phone]

## 2016-09-06 NOTE — Progress Notes (Signed)
Pure wick catheter provided to the patient once patient worked with PT, pt is alert and oriented times 2 now, is in high low bed, multiple bruises noticed on face and skin tear in elbow, MRSA PCR collected, will continue to monitor

## 2016-09-06 NOTE — Clinical Social Work Note (Signed)
Clinical Social Work Assessment  Patient Details  Name: Ebony [name] MRN: [id number] Date of Birth: 1931-09-11  Date of referral:  09/06/16               Reason for consult:  Discharge Planning                Permission sought to share information with:  Family Supports Permission granted to share information::  Yes, Verbal Permission Granted  Name::     Gorden Harms  Agency::  SNFs  Relationship::  Daughter  Contact Information:  [phone]  Housing/Transportation Living arrangements for the past 2 months:  [place]) Source of Information:  Adult Children Patient Interpreter Needed:  None Criminal Activity/Legal Involvement Pertinent to Current Situation/Hospitalization:  No - Comment as needed Significant Relationships:  Adult Children Lives with:  Facility Resident Do you feel safe going back to the place where you live?  Yes Need for family participation in patient care:  Yes (Comment)  Care giving concerns:  No care giving concerns identified.    Social Worker assessment / plan:  CSW met with pt and dtr-Susan to address consult for new SNF. CSW introduced her and explained role of social work. Pt from [place]. P/T is recommending STR at SNF. CSW provided SNF listing for review. Pt agreeable to SNF for STR. Caren Macadam prefers pt to go back to ALF with home health, if possible, but agreeable for FL-2 fax out. CSW confirmed with RN Helaine Chess at [facility] that pt has fallen 6 times and has declined HHPT in the past. Helaine Chess agrees with P/T rec for STR at [facility] and does not feel [facility] can handle pt safely at this time.    CSW sent out FL-2 to SNFs and will follow up on potential bed offer. LOG approved by Dr. Reynaldo Minium, per [facility], for interim until [facility] auth approved. CSW will continue to follow.   Employment status:  Retired Forensic scientist:  Office manager) PT Recommendations:   [place] / Referral to community resources:  [place]  Patient/Family's Response to care:  Dtr appreciative of CSW support and guidance.   Patient/Family's Understanding of and Emotional Response to Diagnosis, Current Treatment, and Prognosis:  Pt oriented x2. Dtr aware of pt diagnosis, current treatment, and prognosis.   Emotional Assessment Appearance:  Appears stated age Attitude/Demeanor/Rapport:  Other (Appropriate) Affect (typically observed):  Pleasant, Calm (Quiet) Orientation:  Oriented to Self, Oriented to Place Alcohol / Substance use:  Other Psych involvement (Current and /or in the community):  No (Comment)  Discharge Needs  Concerns to be addressed:  Discharge Planning Concerns, Care Coordination Readmission within the last 30 days:  No Current discharge risk:  Dependent with Mobility Barriers to Discharge:  Continued Medical Work up, [place], [place] 09/06/2016, 6:13 PM

## 2016-09-06 NOTE — Progress Notes (Signed)
Internal Medicine Attending  Date: 09/06/2016  Patient name: Ebony [name] Medical record number: [id number] Date of birth: [birth year] Age: 83 y.o. Gender: female  I saw and evaluated the patient. I reviewed the resident's note by Dr. Loney Lohathore and I agree with the resident's findings and plans as documented in her progress note.  When seen on rounds this morning I found [name] to be much more interactive than yesterday morning. Her daughter was in the room and agrees she is much improved. We will continue to escalate her antihypertensive regimen in order to get better control of her blood pressure. We anticipate a skilled nursing facility bed will be available tomorrow for transfer.

## 2016-09-07 DIAGNOSIS — I10 Essential (primary) hypertension: Secondary | ICD-10-CM | POA: Diagnosis not present

## 2016-09-07 DIAGNOSIS — W19XXXA Unspecified fall, initial encounter: Secondary | ICD-10-CM | POA: Diagnosis not present

## 2016-09-07 DIAGNOSIS — I471 Supraventricular tachycardia: Secondary | ICD-10-CM | POA: Diagnosis not present

## 2016-09-07 DIAGNOSIS — R296 Repeated falls: Secondary | ICD-10-CM | POA: Diagnosis not present

## 2016-09-07 DIAGNOSIS — S02401A Maxillary fracture, unspecified, initial encounter for closed fracture: Secondary | ICD-10-CM | POA: Diagnosis not present

## 2016-09-07 MED ORDER — LISINOPRIL 10 MG PO TABS
10.0000 mg | ORAL_TABLET | Freq: Once | ORAL | Status: AC
  Administered 2016-09-07: 10 mg via ORAL

## 2016-09-07 MED ORDER — LISINOPRIL 20 MG PO TABS: 20.0000 mg | ORAL_TABLET | Freq: Every day | ORAL | 0 refills | Status: DC

## 2016-09-07 MED ORDER — LISINOPRIL 20 MG PO TABS: 20.0000 mg | ORAL_TABLET | Freq: Every day | ORAL | Status: DC

## 2016-09-07 NOTE — Progress Notes (Signed)
[facility].  Patient to be transported to the facility by daughter via private car. Elnita MaxwellSalome N Jasper Ruminski, RN

## 2016-09-07 NOTE — Clinical Social Work Note (Signed)
CSW facilitated patient discharge including contacting patient family and facility to confirm patient discharge plans. Clinical information faxed to facility and family agreeable with plan. Patient's daughter will transport [facility]. RN to call report prior to discharge [phone]).  CSW will sign off for now as social work intervention is no longer needed. Please consult us again if new needs arise.  Ebony [name], CSW [phone]

## 2016-09-07 NOTE — Discharge Instructions (Signed)
Concussion, Adult  A concussion is a brain injury from a direct hit (blow) to the head or body. This injury causes the brain to shake quickly back and forth inside the skull. It is caused by:   A hit to the head.   A quick and sudden movement (jolt) of the head or neck.    How fast you will get better from a concussion depends on many things like how bad your concussion was, what part of your brain was hurt, how old you are, and how healthy you were before the concussion. Recovery can take time. It is important to wait to return to activity until a doctor says it is safe and your symptoms are all gone.  Follow these instructions at home:  Activity   Limit activities that need a lot of thought or concentration. These include:  ? Homework or work for your job.  ? Watching TV.  ? Computer work.  ? Playing memory games and puzzles.   Rest. Rest helps the brain to heal. Make sure you:  ? Get plenty of sleep at night. Do not stay up late.  ? Go to bed at the same time every day.  ? Rest during the day. Take naps or rest breaks when you feel tired.   It can be dangerous if you get another concussion before the first one has healed Do not do activities that could cause a second concussion, such as riding a bike or playing sports.   Ask your doctor when you can return to your normal activities, like driving, riding a bike, or using machinery. Your ability to react may be slower. Do not do these activities if you are dizzy. Your doctor will likely give you a plan for slowly going back to activities.  General instructions   Take over-the-counter and prescription medicines only as told by your doctor.   Do not drink alcohol until your doctor says you can.   If it is harder than usual to remember things, write them down.   If you are easily distracted, try to do one thing at a time. For example, do not try to watch TV while making dinner.   Talk with family members or close friends when you need to make important  decisions.   Watch your symptoms and tell other people to do the same. Other problems (complications) can happen after a concussion. Older adults with a brain injury may have a higher risk of serious problems, such as a blood clot in the brain.   Tell your teachers, school nurse, school counselor, coach, athletic trainer, or work manager about your injury and symptoms. Tell them about what you can or cannot do. They should watch for:  ? More problems with attention or concentration.  ? More trouble remembering or learning new information.  ? More time needed to do tasks or assignments.  ? Being more annoyed (irritable) or having a harder time dealing with stress.  ? Any other symptoms that get worse.   Keep all follow-up visits as told by your health care provider. This is important.  Prevention   It is very important that you donot get another brain injury, especially before you have healed. In rare cases, another injury can cause permanent brain damage, brain swelling, or death. You have the most risk if you get another head injury in the first 7-10 days after you were hurt before. To avoid injuries:  ? Wear a seat belt when you ride in   a car.  ? Do not drink too much alcohol.  ? Avoid activities that could make you get a second concussion, like contact sports.  ? Wear a helmet when you do activities like:   Biking.   Skiing.   Skateboarding.   Skating.  ? Make your home safe by:   Removing things from the floor or stairs that could make you trip.   Using grab bars in bathrooms and handrails by stairs.   Placing non-slip mats on floors and in bathtubs.   Putting more light in dark areas.  Contact a doctor if:   Your symptoms get worse.   You have new symptoms.   You keep having symptoms for more than 2 weeks.  Get help right away if:   You have bad headaches, or your headaches get worse.   You have weakness in any part of your body.   You have loss of feeling (numbness).   You feel off  balance.   You keep throwing up (vomiting).   You feel more sleepy.   The black center of one eye (pupil) is bigger than the other one.   You twitch or shake violently (convulse) or have a seizure.   Your speech is not clear (is slurred).   You feel more tired, more confused, or more annoyed.   You do not recognize people or places.   You have neck pain.   It is hard to wake you up.   You have strange behavior changes.   You pass out (lose consciousness).  Summary   A concussion is a brain injury from a direct hit (blow) to the head or body.   This condition is treated with rest and careful watching of symptoms.   If you keep having symptoms for more than 2 weeks, call your doctor.  This information is not intended to replace advice given to you by your health care provider. Make sure you discuss any questions you have with your health care provider.  Document Released: 02/04/2009 Document Revised: 02/01/2016 Document Reviewed: 02/01/2016  Elsevier Interactive Patient Education  2017 [employer]vier Inc[employer]

## 2016-09-07 NOTE — NC FL2 (Addendum)
[place] MEDICAID FL2 LEVEL OF CARE SCREENING TOOL     IDENTIFICATION  Patient Name: Ebony [name] Birthdate: Feb 13, 1932 Sex: female Admission Date (Current Location): 09/04/2016  [facility] [place]   Facility and Address:  [facility], [address], [place], [place] [address]      Provider Number: [id number]  Attending Physician Name and Address:  Doneen PoissonKlima, Lawrence, MD  Relative Name and Phone Number:  Maye HidesSusan Coggins, [phone]    Current Level of Care: Hospital Recommended Level of Care: Assisted Living Facility  Prior Approval Number:    Date Approved/Denied:   PASRR Number: [id number]  Discharge Plan: Other (Comment) (ALF)    Current Diagnoses: Patient Active Problem List   Diagnosis Date Noted  . Paroxysmal supraventricular tachycardia (HCC)   . Recurrent falls 09/04/2016  . Maxillary sinus fracture (HCC) 09/04/2016  . Closed fracture of nasal bone 09/04/2016  . Altered mental status 09/04/2016  . Acute encephalopathy   . Expressive aphasia   . Ischemic stroke (HCC) 06/24/2016  . Depression with anxiety 06/24/2016  . Hypothyroid 06/24/2016  . GERD (gastroesophageal reflux disease) 06/24/2016  . Allergic rhinitis 06/08/2014  . DDD (degenerative disc disease), lumbar 06/08/2014  . Insomnia 12/07/2013  . Depression   . OA (osteoarthritis) 01/06/2013  . Loss of weight 07/13/2012  . Vascular dementia without behavioral disturbance 06/20/2012  . Hypothyroidism due to acquired atrophy of thyroid 04/15/2012  . SVT (supraventricular tachycardia) (HCC) 04/12/2012  . Confusion 04/10/2012  . Fall 05/16/2011  . Essential hypertension 05/16/2011  . Anxiety disorder 05/16/2011    Orientation RESPIRATION BLADDER Height & Weight     Self  Normal Incontinent Weight: 152 lb (68.9 kg) Height:  5\' 2"  (157.5 cm)  BEHAVIORAL SYMPTOMS/MOOD NEUROLOGICAL BOWEL NUTRITION STATUS  Other (Comment) (Flat affect, cooperative)   (Vascular dementia, ) Continent Diet (No added salt)  AMBULATORY STATUS COMMUNICATION OF NEEDS Skin   Limited Assist Verbally Skin abrasions, Bruising, Other (Comment) (Skin tear.)                       Personal Care Assistance Level of Assistance  Bathing, Feeding, Dressing Bathing Assistance: Limited assistance Feeding assistance: Independent Dressing Assistance: Limited assistance     Functional Limitations Info  Sight, Hearing, Speech Sight Info: Adequate Hearing Info: Adequate Speech Info: Adequate    SPECIAL CARE FACTORS FREQUENCY  PT (By licensed PT), OT (By licensed OT)     PT Frequency: 3 x week OT Frequency: 3 x week            Contractures Contractures Info: Not present    Additional Factors Info  Code Status, Allergies, Psychotropic Code Status Info: DNR Allergies Info: Sulfa Antibiotics, Ace Inhibitors, Biaxin (Clarithromycin), Penicillins, Klor-con (Potassium Chloride), Cozaar (Losartan Potassium) Psychotropic Info: Depression, Anxiety: Cymbalta DR 20 mg PO daily on Sunday, Tuesday, Thursday, Saturday, Cymbalta DR 40 mg PO daily on Monday, Wednesday, Friday.         Current Medications (09/07/2016):  This is the current hospital active medication list Current Facility-Administered Medications  Medication Dose Route Frequency Provider Last Rate Last Dose  . acetaminophen (TYLENOL) tablet 650 mg  650 mg Oral Q6H PRN Gwynn BurlyWallace, Andrew, DO   650 mg at 09/06/16 [id number]   Or  . acetaminophen (TYLENOL) suppository 650 mg  650 mg Rectal Q6H PRN Gwynn BurlyWallace, Andrew, DO      . aspirin chewable tablet 81 mg  81 mg Oral Daily Gwynn BurlyWallace, Andrew,  DO   81 mg at 09/07/16 1011  . DULoxetine (CYMBALTA) DR capsule 20 mg  20 mg Oral Once per day on Sun Tue Thu Sat Gwynn Burly, DO   20 mg at 09/06/16 9147   And  . DULoxetine (CYMBALTA) DR capsule 40 mg  40 mg Oral Once per day on Mon Wed Fri Gwynn Burly, DO   40 mg at 09/07/16 1011  . enoxaparin (LOVENOX) injection 40 mg   40 mg Subcutaneous Q24H Gwynn Burly, DO   40 mg at 09/06/16 1703  . fluticasone (FLONASE) 50 MCG/ACT nasal spray 2 spray  2 spray Each Nare Daily Gwynn Burly, DO   2 spray at 09/07/16 1013  . HYDROcodone-acetaminophen (NORCO/VICODIN) 5-325 MG per tablet 1 tablet  1 tablet Oral Q6H PRN Gwynn Burly, DO   1 tablet at 09/07/16 [phone]  . levothyroxine (SYNTHROID, LEVOTHROID) tablet 25 mcg  25 mcg Oral QAC breakfast Gwynn Burly, DO   25 mcg at 09/07/16 6213  . [START ON 09/08/2016] lisinopril (PRINIVIL,ZESTRIL) tablet 20 mg  20 mg Oral Daily Helberg, Justin, MD      . metoprolol succinate (TOPROL-XL) 24 hr tablet 150 mg  150 mg Oral Etter Sjogren, MD   150 mg at 09/07/16 0839  . MUSCLE RUB CREA 1 application  1 application Topical Q8H PRN Gwynn Burly, DO      . pantoprazole (PROTONIX) EC tablet 20 mg  20 mg Oral QHS Gwynn Burly, DO   20 mg at 09/06/16 2132  . polyethylene glycol (MIRALAX / GLYCOLAX) packet 17 g  17 g Oral Daily PRN Gwynn Burly, DO      . polyvinyl alcohol (LIQUIFILM TEARS) 1.4 % ophthalmic solution 1 drop  1 drop Both Eyes q morning - 10a Gwynn Burly, DO   1 drop at 09/07/16 1012  . sodium chloride flush (NS) 0.9 % injection 3 mL  3 mL Intravenous Q12H Gwynn Burly, DO   3 mL at 09/07/16 1013     Discharge Medications: STOP taking these medications   ALPRAZolam 0.25 MG tablet Commonly known as:  XANAX     TAKE these medications   acetaminophen 500 MG tablet Commonly known as:  TYLENOL Take 500 mg by mouth 2 (two) times daily.   ARTIFICIAL TEARS 0.1-0.3 % Soln Generic drug:  Dextran 70-Hypromellose Place 1 drop into both eyes every morning.   aspirin 81 MG chewable tablet Chew 81 mg by mouth daily.   BIOFREEZE 4 % Gel Generic drug:  Menthol (Topical Analgesic) Apply 1 application topically every 8 (eight) hours as needed (knee pain).   DAILY-VITE Tabs Take 1 tablet by mouth daily.   DULoxetine 20 MG capsule Commonly  known as:  CYMBALTA Take 20-40 mg by mouth See admin instructions. 20 mg in the morning on Sun/Tues/Thurs/Sat and 40 mg on Mon/Wed/Fri   fluticasone 50 MCG/ACT nasal spray Commonly known as:  FLONASE Place 2 sprays into both nostrils daily.   HYDROcodone-acetaminophen 5-325 MG tablet Commonly known as:  NORCO/VICODIN Take one tablet by mouth every 8 hours as needed for pain   ibuprofen 200 MG tablet Commonly known as:  ADVIL,MOTRIN Take 200 mg by mouth every 12 (twelve) hours as needed (for pain).   levothyroxine 25 MCG tablet Commonly known as:  SYNTHROID, LEVOTHROID Take 1 tablet (25 mcg total) by mouth daily before breakfast. What changed:  when to take this   lisinopril 20 MG tablet Commonly known as:  PRINIVIL,ZESTRIL Take 1 tablet (20 mg total) by  mouth daily. Start taking on:  09/08/2016 What changed:  medication strength  how much to take   Melatonin 3 MG Caps By mouth qhs for sleep   metoprolol succinate 100 MG 24 hr tablet Commonly known as:  TOPROL-XL Take 150 mg by mouth every morning. for HTN.   ondansetron 4 MG tablet Commonly known as:  ZOFRAN Take 4 mg by mouth every 6 (six) hours as needed for nausea or vomiting.   pantoprazole 20 MG tablet Commonly known as:  PROTONIX Take 20 mg by mouth at bedtime.   saccharomyces boulardii 250 MG capsule Commonly known as:  FLORASTOR Take 250 mg by mouth 2 (two) times daily.   THERA-M PO Take 1 tablet by mouth every morning.                                                  Relevant Imaging Results:  Relevant Lab Results:   Additional Information SS#: [id number]  Margarito Liner, LCSW

## 2016-09-07 NOTE — Progress Notes (Signed)
Internal Medicine Attending  Date: 09/07/2016  Patient name: Ebony [name] Medical record number: [id number] Date of birth: [birth year] Age: 83 y.o. Gender: female  I saw and evaluated the patient. I reviewed the resident's note by Dr. Caron PresumeHelberg and I agree with the resident's findings and plans as documented in his progress note.  When seen on rounds this morning [name] was even more interactive than yesterday. I believe she is likely at baseline. It looks like her daughter has changed her mind and would rather her return back to the assisted living facility rather than a skilled nursing facility. If this is the wishes of the patient and family and we will plan on transferring her back to her assisted living facility today.

## 2016-09-07 NOTE — Progress Notes (Signed)
Pt in pain and blood pressure is 186/84 PRN given for pain and paging MD for Blood pressure meds.

## 2016-09-07 NOTE — Clinical Social Work Note (Addendum)
CSW spoke with patient's daughter on the phone. She prefers for patient to return to ALF rather than SNF. ALF called and left voicemail this morning. CSW returned call and left voicemail. Awaiting call back. MD updated.  Charlynn CourtSarah Adelis Docter, CSW [phone]  11:41 am CSW left another voicemail for admissions at ALF.  Charlynn CourtSarah Deiontae Rabel, CSW [phone]  12:14 pm Patient's daughter called ALF and spoke with charge nurse, Arther DamesPriscilla Harris. Ms. Tiburcio PeaHarris gave patient's daughter direct phone number to give CSW. CSW called and she stated that they would have to review the H&P, FL2, therapy notes, and discharge summary before they determined if she could return or not. [facility] but stated she would review them when she returned. CSW faxed over H&P, FL2, and therapy notes. CSW paged MD to get discharge summary.  Charlynn CourtSarah Neftaly Inzunza, CSW [phone]

## 2016-09-07 NOTE — Evaluation (Signed)
Occupational Therapy Evaluation Patient Details Name: Ebony [name] MRN: [id number] DOB: April 11, 1931 Today's Date: 09/07/2016    History of Present Illness Pt is an 83 yo female seen in ED following a fall early this AM (09/04/16) resulting in a nasal fx (being tx conservatively). Pt has had 5 falls in the past week. Pt was in ED earlier this week following a fall out of her chair. Pt was admitted in April of this year with possible stroke. PMH significant for acute ischemia stroke 2/14, anxiety, OA, HLD, HTN, subdural hematoma with subarachnoid hemorrhage 3/13.  Pt has been diagnosed with a possible concussion although no acute findings were visualized on CT or MRI.    Clinical Impression   Pt admitted with the above diagnosis and demonstrates the below listed deficits.  She demonstrates significant cognitive impairment including delayed processing, decreased initiation, impaired problem solving, and decreased safety awareness, problem solving, and impaired attention.  She requires an excessive amount of time to complete simple tasks due to distractibility and initiation deficits as well as impaired balance, and generalized weakness .  She requires mod A, overall for ADLs, and min A for functional mobility as she has difficulty maneuvering in small spaces such as bathroom, and around obstacles in her room.  Due to the severity of her cognitive deficits coupled with impaired balance, she is at continued risk for falls and injury.  She will need 24 hour supervision, and direct supervision/assist when she is up ambulating and when performing all ADLs, as well as follow up OT, PT.  Optimally recommend SNF level rehab prior to returning to ALF.       Follow Up Recommendations  SNF; (if pt returning to ALF, recommend HHOT, PT)   Equipment Recommendations  None recommended by OT    Recommendations for Other Services       Precautions / Restrictions Precautions Precautions: Fall Precaution  Comments: significant recent h/o multiple falls      Mobility Bed Mobility Overal bed mobility: Needs Assistance Bed Mobility: Supine to Sit     Supine to sit: Min assist     General bed mobility comments: requires max cues and min A to assist pt with initiating task.  She required >10 mins to complete activity   Transfers Overall transfer level: Needs assistance Equipment used: Rolling walker (2 wheeled) Transfers: Sit to/from [employer] CorporationStand;Stand Pivot Transfers Sit to Stand: Min assist Stand pivot transfers: Min assist       General transfer comment: Pt distracted by pain at IV site, and unable to move into standing without physical pain as she begins to initiate movement, then stops to examine hand.  She does this multiple times.  Physical assist provided to change her hand position, as she was unable to problem solve a solution to the problem on her own.  She then required min A for initiation of task and to power up into standing     Balance Overall balance assessment: Needs assistance Sitting-balance support: Feet supported;No upper extremity supported Sitting balance-Leahy Scale: Good     Standing balance support: Bilateral upper extremity supported Standing balance-Leahy Scale: Poor Standing balance comment: reliant on UE support                            ADL either performed or assessed with clinical judgement   ADL Overall ADL's : Needs assistance/impaired Eating/Feeding: Set up;Sitting   Grooming: Wash/dry hands;Wash/dry face;Minimal assistance;Standing Grooming Details (indicate cue type  and reason): requires min A for attentional and cognitive deficits  Upper Body Bathing: Moderate assistance;Sitting   Lower Body Bathing: Maximal assistance;Sit to/from stand   Upper Body Dressing : Moderate assistance;Sitting   Lower Body Dressing: Moderate assistance;Sit to/from stand   Toilet Transfer: Minimal assistance;Ambulation;Comfort height toilet;RW;Grab  bars Toilet Transfer Details (indicate cue type and reason): Pt demonstrates difficulty maneuvering RW in small spaces.  She frequently bumps into obstacles, requiring cues and assist for safety          Functional mobility during ADLs: Minimal assistance;Rolling walker General ADL Comments: Pt requires assist due to impaired ability to sustain attention to tasks she is engaged in      Vision   Additional Comments: Pt unable to sustain attention to complete visual assessment      Perception     Praxis Praxis Praxis tested?: Deficits Deficits: Initiation    Pertinent Vitals/Pain Pain Assessment: Faces Faces Pain Scale: Hurts little more Pain Location: Lt hand at IV site      Hand Dominance Right   Extremity/Trunk Assessment Upper Extremity Assessment Upper Extremity Assessment: Generalized weakness   Lower Extremity Assessment Lower Extremity Assessment: Defer to PT evaluation   Cervical / Trunk Assessment Cervical / Trunk Assessment: Kyphotic   Communication Communication Communication: No difficulties   Cognition Arousal/Alertness: Awake/alert Behavior During Therapy: Flat affect Overall Cognitive Status: Impaired/Different from baseline Area of Impairment: Attention;Memory;Following commands;Safety/judgement;Problem solving               Rancho Levels of Cognitive Functioning Rancho Los Amigos Scales of Cognitive Functioning: Automatic/appropriate   Current Attention Level: Sustained (with max cues ) Memory: Decreased short-term memory Following Commands: Follows one step commands consistently;Follows one step commands inconsistently (and cues ) Safety/Judgement: Decreased awareness of safety;Decreased awareness of deficits   Problem Solving: Slow processing;Decreased initiation;Difficulty sequencing;Requires verbal cues;Requires tactile cues General Comments: Pt is oriented x 4.  She requires max cues to sustain attention to task.  She is highly  distracted internally, and by anything within her environment.  She requires max cues to initiate tasks - required > 10 mins to move to EOB, despite verbal and tactile cues.  She demonstrates poor problem solving    General Comments  daughter present at end of session     Exercises     Shoulder Instructions      Home Living Family/patient expects to be discharged to:: Assisted living                             Home Equipment: Dan Humphreys - 2 wheels;Walker - 4 wheels;Walker - standard;Bedside commode;Shower seat;Grab bars - toilet   Additional Comments: Pt lived at [facility]       Prior Functioning/Environment Level of Independence: Independent with assistive device(s)        Comments: h/o recent falls         OT Problem List: Decreased strength;Decreased activity tolerance;Impaired balance (sitting and/or standing);Decreased cognition;Decreased safety awareness;Decreased knowledge of use of DME or AE;Pain      OT Treatment/Interventions:      OT Goals(Current goals can be found in the care plan section) Acute Rehab OT Goals Patient Stated Goal: Family would like her to return to safest facility   OT Frequency:     Barriers to D/C:            Co-evaluation              AM-PAC  PT "6 Clicks" Daily Activity     Outcome Measure Help from another person eating meals?: A Little Help from another person taking care of personal grooming?: A Little Help from another person toileting, which includes using toliet, bedpan, or urinal?: A Little Help from another person bathing (including washing, rinsing, drying)?: A Lot Help from another person to put on and taking off regular upper body clothing?: A Lot Help from another person to put on and taking off regular lower body clothing?: A Lot 6 Click Score: 15   End of Session Equipment Utilized During Treatment: Engineer, water Communication: Mobility status  Activity Tolerance: Patient tolerated  treatment well Patient left: in chair;with call bell/phone within reach;with chair alarm set  OT Visit Diagnosis: Unsteadiness on feet (R26.81);Cognitive communication deficit (R41.841)                Time: 1610-9604 OT Time Calculation (min): 59 min Charges:  OT General Charges $OT Visit: 1 Procedure OT Evaluation $OT Eval Moderate Complexity: 1 Procedure OT Treatments $Self Care/Home Management : 38-52 mins G-Codes: OT G-codes **NOT FOR INPATIENT CLASS** Functional Assessment Tool Used: AM-PAC 6 Clicks Daily Activity Functional Limitation: Self care Self Care Current Status (V4098): At least 40 percent but less than 60 percent impaired, limited or restricted Self Care Goal Status (J1914): At least 40 percent but less than 60 percent impaired, limited or restricted Self Care Discharge Status [phone]): At least 40 percent but less than 60 percent impaired, limited or restricted   [employer], OTR/L [phone]   Jeani Hawking M 09/07/2016, 12:27 PM

## 2016-09-07 NOTE — Progress Notes (Signed)
   Subjective: Ebony [name] did well overnight. Pain is well controlled. Very pleasant to talk with. States that her fall was mechanical in nature. Tripped over walker and hit her face on the hand rales. Denies trouble breathing, soa, chest pain, N/V or pain with extraocular eye movements.  ROS unremarkable except what is mentioned above.    Objective:  Vital signs in last 24 hours: Vitals:   09/06/16 1800 09/06/16 2230 09/07/16 0600 09/07/16 0839  BP: (!) 150/60 140/62  (!) 143/82  Pulse:  75  89  Resp:  19    Temp:  98.3 F (36.8 C)    TempSrc:  Oral    SpO2:  95%    Weight:   152 lb (68.9 kg)   Height:       Physical Exam  Constitutional: She appears well-developed and well-nourished.  HENT:  Head: Normocephalic.  Extensive bruising over the maxillary sinus bilaterally, swell greatly reduced since admission.   Eyes: EOM are normal. Pupils are equal, round, and reactive to light.  Cardiovascular: Normal rate, normal heart sounds and intact distal pulses.   Irregularly irregular   Pulmonary/Chest: Effort normal and breath sounds normal. No respiratory distress. She has no wheezes.  Abdominal: Soft. Bowel sounds are normal. She exhibits no distension. There is no tenderness. There is no guarding.  Skin: Skin is warm and dry.  Psychiatric: She has a normal mood and affect. Thought content normal.    Assessment/Plan:  Principal Problem:   Maxillary sinus fracture [facility](HCC) Active Problems:   Fall   Essential hypertension   Expressive aphasia   Recurrent falls   Closed fracture of nasal bone   Altered mental status   Paroxysmal supraventricular tachycardia (HCC)  1. Nasal and Maxillary Sinus Fractures.  - Swelling greatly reduced over the interval, still with significant bruising  - Denies trouble breathing or pain with extraocular eye movements  - Outpatient follow-up with ENT scheduled.   2. Recurrent Falls. - States that they are mechanical in nature  - Does have paroxysmal  SVT - Continue to hold Xanax  - Likely discharge today to SNF   3. Altered Mental Status 2/2 concussion  - Improved over the interval  - Pleasant to converse with   4. Paroxysmal Supraventricular Tachycardia  - 1st degree heart block on telemetry with episodes of SVT  - On Metoprolol ER 150mg  PO QD - Not a candidate for anticoagulation due to recurrent falls   5. Hypertension  - Increased Lisinopril to 20mg  PO QD  Dispo: Anticipated discharge in approximately 0 day(s).   Levora DredgeHelberg, Ebony Goodlin, MD 09/07/2016, 10:11 AM Pager: My Pager: [phone]

## 2016-09-08 DIAGNOSIS — R69 Illness, unspecified: Secondary | ICD-10-CM | POA: Diagnosis not present

## 2016-09-08 DIAGNOSIS — J449 Chronic obstructive pulmonary disease, unspecified: Secondary | ICD-10-CM | POA: Diagnosis not present

## 2016-09-08 DIAGNOSIS — G894 Chronic pain syndrome: Secondary | ICD-10-CM | POA: Diagnosis not present

## 2016-09-08 DIAGNOSIS — E038 Other specified hypothyroidism: Secondary | ICD-10-CM | POA: Diagnosis not present

## 2016-09-08 DIAGNOSIS — R233 Spontaneous ecchymoses: Secondary | ICD-10-CM | POA: Diagnosis not present

## 2016-09-08 DIAGNOSIS — K5901 Slow transit constipation: Secondary | ICD-10-CM | POA: Diagnosis not present

## 2016-09-08 DIAGNOSIS — I10 Essential (primary) hypertension: Secondary | ICD-10-CM | POA: Diagnosis not present

## 2016-09-08 DIAGNOSIS — S0280XA Fracture of other specified skull and facial bones, unspecified side, initial encounter for closed fracture: Secondary | ICD-10-CM | POA: Diagnosis not present

## 2016-09-09 DIAGNOSIS — R296 Repeated falls: Secondary | ICD-10-CM | POA: Diagnosis not present

## 2016-09-09 DIAGNOSIS — R278 Other lack of coordination: Secondary | ICD-10-CM | POA: Diagnosis not present

## 2016-09-09 DIAGNOSIS — M6281 Muscle weakness (generalized): Secondary | ICD-10-CM | POA: Diagnosis not present

## 2016-09-10 DIAGNOSIS — I69320 Aphasia following cerebral infarction: Secondary | ICD-10-CM | POA: Diagnosis not present

## 2016-09-10 DIAGNOSIS — R69 Illness, unspecified: Secondary | ICD-10-CM | POA: Diagnosis not present

## 2016-09-10 DIAGNOSIS — S51812D Laceration without foreign body of left forearm, subsequent encounter: Secondary | ICD-10-CM | POA: Diagnosis not present

## 2016-09-10 DIAGNOSIS — I4891 Unspecified atrial fibrillation: Secondary | ICD-10-CM | POA: Diagnosis not present

## 2016-09-10 DIAGNOSIS — I10 Essential (primary) hypertension: Secondary | ICD-10-CM | POA: Diagnosis not present

## 2016-09-10 DIAGNOSIS — S02401D Maxillary fracture, unspecified, subsequent encounter for fracture with routine healing: Secondary | ICD-10-CM | POA: Diagnosis not present

## 2016-09-10 DIAGNOSIS — M1990 Unspecified osteoarthritis, unspecified site: Secondary | ICD-10-CM | POA: Diagnosis not present

## 2016-09-14 DIAGNOSIS — R69 Illness, unspecified: Secondary | ICD-10-CM | POA: Diagnosis not present

## 2016-09-14 DIAGNOSIS — I69320 Aphasia following cerebral infarction: Secondary | ICD-10-CM | POA: Diagnosis not present

## 2016-09-14 DIAGNOSIS — M1990 Unspecified osteoarthritis, unspecified site: Secondary | ICD-10-CM | POA: Diagnosis not present

## 2016-09-14 DIAGNOSIS — S51812D Laceration without foreign body of left forearm, subsequent encounter: Secondary | ICD-10-CM | POA: Diagnosis not present

## 2016-09-14 DIAGNOSIS — I10 Essential (primary) hypertension: Secondary | ICD-10-CM | POA: Diagnosis not present

## 2016-09-14 DIAGNOSIS — S02401D Maxillary fracture, unspecified, subsequent encounter for fracture with routine healing: Secondary | ICD-10-CM | POA: Diagnosis not present

## 2016-09-15 DIAGNOSIS — S51812D Laceration without foreign body of left forearm, subsequent encounter: Secondary | ICD-10-CM | POA: Diagnosis not present

## 2016-09-15 DIAGNOSIS — M1990 Unspecified osteoarthritis, unspecified site: Secondary | ICD-10-CM | POA: Diagnosis not present

## 2016-09-15 DIAGNOSIS — Z79899 Other long term (current) drug therapy: Secondary | ICD-10-CM | POA: Diagnosis not present

## 2016-09-15 DIAGNOSIS — I10 Essential (primary) hypertension: Secondary | ICD-10-CM | POA: Diagnosis not present

## 2016-09-15 DIAGNOSIS — R69 Illness, unspecified: Secondary | ICD-10-CM | POA: Diagnosis not present

## 2016-09-15 DIAGNOSIS — S02401D Maxillary fracture, unspecified, subsequent encounter for fracture with routine healing: Secondary | ICD-10-CM | POA: Diagnosis not present

## 2016-09-15 DIAGNOSIS — I69320 Aphasia following cerebral infarction: Secondary | ICD-10-CM | POA: Diagnosis not present

## 2016-09-16 DIAGNOSIS — I10 Essential (primary) hypertension: Secondary | ICD-10-CM | POA: Diagnosis not present

## 2016-09-16 DIAGNOSIS — S02401D Maxillary fracture, unspecified, subsequent encounter for fracture with routine healing: Secondary | ICD-10-CM | POA: Diagnosis not present

## 2016-09-16 DIAGNOSIS — S51812D Laceration without foreign body of left forearm, subsequent encounter: Secondary | ICD-10-CM | POA: Diagnosis not present

## 2016-09-16 DIAGNOSIS — R69 Illness, unspecified: Secondary | ICD-10-CM | POA: Diagnosis not present

## 2016-09-16 DIAGNOSIS — I69320 Aphasia following cerebral infarction: Secondary | ICD-10-CM | POA: Diagnosis not present

## 2016-09-16 DIAGNOSIS — M1990 Unspecified osteoarthritis, unspecified site: Secondary | ICD-10-CM | POA: Diagnosis not present

## 2016-09-17 DIAGNOSIS — I69320 Aphasia following cerebral infarction: Secondary | ICD-10-CM | POA: Diagnosis not present

## 2016-09-17 DIAGNOSIS — M1990 Unspecified osteoarthritis, unspecified site: Secondary | ICD-10-CM | POA: Diagnosis not present

## 2016-09-17 DIAGNOSIS — R69 Illness, unspecified: Secondary | ICD-10-CM | POA: Diagnosis not present

## 2016-09-17 DIAGNOSIS — I10 Essential (primary) hypertension: Secondary | ICD-10-CM | POA: Diagnosis not present

## 2016-09-17 DIAGNOSIS — S02401D Maxillary fracture, unspecified, subsequent encounter for fracture with routine healing: Secondary | ICD-10-CM | POA: Diagnosis not present

## 2016-09-17 DIAGNOSIS — S51812D Laceration without foreign body of left forearm, subsequent encounter: Secondary | ICD-10-CM | POA: Diagnosis not present

## 2016-09-18 DIAGNOSIS — I69320 Aphasia following cerebral infarction: Secondary | ICD-10-CM | POA: Diagnosis not present

## 2016-09-18 DIAGNOSIS — R69 Illness, unspecified: Secondary | ICD-10-CM | POA: Diagnosis not present

## 2016-09-18 DIAGNOSIS — S02401D Maxillary fracture, unspecified, subsequent encounter for fracture with routine healing: Secondary | ICD-10-CM | POA: Diagnosis not present

## 2016-09-18 DIAGNOSIS — I10 Essential (primary) hypertension: Secondary | ICD-10-CM | POA: Diagnosis not present

## 2016-09-18 DIAGNOSIS — M1990 Unspecified osteoarthritis, unspecified site: Secondary | ICD-10-CM | POA: Diagnosis not present

## 2016-09-18 DIAGNOSIS — S51812D Laceration without foreign body of left forearm, subsequent encounter: Secondary | ICD-10-CM | POA: Diagnosis not present

## 2016-09-19 DIAGNOSIS — I10 Essential (primary) hypertension: Secondary | ICD-10-CM | POA: Diagnosis not present

## 2016-09-19 DIAGNOSIS — R69 Illness, unspecified: Secondary | ICD-10-CM | POA: Diagnosis not present

## 2016-09-19 DIAGNOSIS — M1990 Unspecified osteoarthritis, unspecified site: Secondary | ICD-10-CM | POA: Diagnosis not present

## 2016-09-19 DIAGNOSIS — S02401D Maxillary fracture, unspecified, subsequent encounter for fracture with routine healing: Secondary | ICD-10-CM | POA: Diagnosis not present

## 2016-09-19 DIAGNOSIS — S51812D Laceration without foreign body of left forearm, subsequent encounter: Secondary | ICD-10-CM | POA: Diagnosis not present

## 2016-09-19 DIAGNOSIS — I69320 Aphasia following cerebral infarction: Secondary | ICD-10-CM | POA: Diagnosis not present

## 2016-09-20 DIAGNOSIS — S51812D Laceration without foreign body of left forearm, subsequent encounter: Secondary | ICD-10-CM | POA: Diagnosis not present

## 2016-09-20 DIAGNOSIS — M1990 Unspecified osteoarthritis, unspecified site: Secondary | ICD-10-CM | POA: Diagnosis not present

## 2016-09-20 DIAGNOSIS — S02401D Maxillary fracture, unspecified, subsequent encounter for fracture with routine healing: Secondary | ICD-10-CM | POA: Diagnosis not present

## 2016-09-20 DIAGNOSIS — R69 Illness, unspecified: Secondary | ICD-10-CM | POA: Diagnosis not present

## 2016-09-20 DIAGNOSIS — I10 Essential (primary) hypertension: Secondary | ICD-10-CM | POA: Diagnosis not present

## 2016-09-20 DIAGNOSIS — I69320 Aphasia following cerebral infarction: Secondary | ICD-10-CM | POA: Diagnosis not present

## 2016-09-21 DIAGNOSIS — S51812D Laceration without foreign body of left forearm, subsequent encounter: Secondary | ICD-10-CM | POA: Diagnosis not present

## 2016-09-21 DIAGNOSIS — I10 Essential (primary) hypertension: Secondary | ICD-10-CM | POA: Diagnosis not present

## 2016-09-21 DIAGNOSIS — S02401D Maxillary fracture, unspecified, subsequent encounter for fracture with routine healing: Secondary | ICD-10-CM | POA: Diagnosis not present

## 2016-09-21 DIAGNOSIS — R69 Illness, unspecified: Secondary | ICD-10-CM | POA: Diagnosis not present

## 2016-09-21 DIAGNOSIS — I69320 Aphasia following cerebral infarction: Secondary | ICD-10-CM | POA: Diagnosis not present

## 2016-09-21 DIAGNOSIS — M1990 Unspecified osteoarthritis, unspecified site: Secondary | ICD-10-CM | POA: Diagnosis not present

## 2016-09-22 DIAGNOSIS — I10 Essential (primary) hypertension: Secondary | ICD-10-CM | POA: Diagnosis not present

## 2016-09-22 DIAGNOSIS — S02401D Maxillary fracture, unspecified, subsequent encounter for fracture with routine healing: Secondary | ICD-10-CM | POA: Diagnosis not present

## 2016-09-22 DIAGNOSIS — S51812D Laceration without foreign body of left forearm, subsequent encounter: Secondary | ICD-10-CM | POA: Diagnosis not present

## 2016-09-22 DIAGNOSIS — R69 Illness, unspecified: Secondary | ICD-10-CM | POA: Diagnosis not present

## 2016-09-22 DIAGNOSIS — I69320 Aphasia following cerebral infarction: Secondary | ICD-10-CM | POA: Diagnosis not present

## 2016-09-22 DIAGNOSIS — M1990 Unspecified osteoarthritis, unspecified site: Secondary | ICD-10-CM | POA: Diagnosis not present

## 2016-09-23 DIAGNOSIS — I10 Essential (primary) hypertension: Secondary | ICD-10-CM | POA: Diagnosis not present

## 2016-09-23 DIAGNOSIS — R69 Illness, unspecified: Secondary | ICD-10-CM | POA: Diagnosis not present

## 2016-09-23 DIAGNOSIS — S02401D Maxillary fracture, unspecified, subsequent encounter for fracture with routine healing: Secondary | ICD-10-CM | POA: Diagnosis not present

## 2016-09-23 DIAGNOSIS — M1990 Unspecified osteoarthritis, unspecified site: Secondary | ICD-10-CM | POA: Diagnosis not present

## 2016-09-23 DIAGNOSIS — I69320 Aphasia following cerebral infarction: Secondary | ICD-10-CM | POA: Diagnosis not present

## 2016-09-23 DIAGNOSIS — S51812D Laceration without foreign body of left forearm, subsequent encounter: Secondary | ICD-10-CM | POA: Diagnosis not present

## 2016-09-24 DIAGNOSIS — I69320 Aphasia following cerebral infarction: Secondary | ICD-10-CM | POA: Diagnosis not present

## 2016-09-24 DIAGNOSIS — S51812D Laceration without foreign body of left forearm, subsequent encounter: Secondary | ICD-10-CM | POA: Diagnosis not present

## 2016-09-24 DIAGNOSIS — R69 Illness, unspecified: Secondary | ICD-10-CM | POA: Diagnosis not present

## 2016-09-24 DIAGNOSIS — M1990 Unspecified osteoarthritis, unspecified site: Secondary | ICD-10-CM | POA: Diagnosis not present

## 2016-09-24 DIAGNOSIS — I10 Essential (primary) hypertension: Secondary | ICD-10-CM | POA: Diagnosis not present

## 2016-09-24 DIAGNOSIS — S02401D Maxillary fracture, unspecified, subsequent encounter for fracture with routine healing: Secondary | ICD-10-CM | POA: Diagnosis not present

## 2016-09-28 DIAGNOSIS — S51812D Laceration without foreign body of left forearm, subsequent encounter: Secondary | ICD-10-CM | POA: Diagnosis not present

## 2016-09-28 DIAGNOSIS — I10 Essential (primary) hypertension: Secondary | ICD-10-CM | POA: Diagnosis not present

## 2016-09-28 DIAGNOSIS — R69 Illness, unspecified: Secondary | ICD-10-CM | POA: Diagnosis not present

## 2016-09-28 DIAGNOSIS — I69320 Aphasia following cerebral infarction: Secondary | ICD-10-CM | POA: Diagnosis not present

## 2016-09-28 DIAGNOSIS — M1990 Unspecified osteoarthritis, unspecified site: Secondary | ICD-10-CM | POA: Diagnosis not present

## 2016-09-28 DIAGNOSIS — S02401D Maxillary fracture, unspecified, subsequent encounter for fracture with routine healing: Secondary | ICD-10-CM | POA: Diagnosis not present

## 2016-09-30 DIAGNOSIS — R69 Illness, unspecified: Secondary | ICD-10-CM | POA: Diagnosis not present

## 2016-09-30 DIAGNOSIS — S51812D Laceration without foreign body of left forearm, subsequent encounter: Secondary | ICD-10-CM | POA: Diagnosis not present

## 2016-09-30 DIAGNOSIS — I10 Essential (primary) hypertension: Secondary | ICD-10-CM | POA: Diagnosis not present

## 2016-09-30 DIAGNOSIS — I69320 Aphasia following cerebral infarction: Secondary | ICD-10-CM | POA: Diagnosis not present

## 2016-09-30 DIAGNOSIS — M1990 Unspecified osteoarthritis, unspecified site: Secondary | ICD-10-CM | POA: Diagnosis not present

## 2016-09-30 DIAGNOSIS — S02401D Maxillary fracture, unspecified, subsequent encounter for fracture with routine healing: Secondary | ICD-10-CM | POA: Diagnosis not present

## 2016-10-01 DIAGNOSIS — I10 Essential (primary) hypertension: Secondary | ICD-10-CM | POA: Diagnosis not present

## 2016-10-01 DIAGNOSIS — R69 Illness, unspecified: Secondary | ICD-10-CM | POA: Diagnosis not present

## 2016-10-01 DIAGNOSIS — S51812D Laceration without foreign body of left forearm, subsequent encounter: Secondary | ICD-10-CM | POA: Diagnosis not present

## 2016-10-01 DIAGNOSIS — I69320 Aphasia following cerebral infarction: Secondary | ICD-10-CM | POA: Diagnosis not present

## 2016-10-01 DIAGNOSIS — S02401D Maxillary fracture, unspecified, subsequent encounter for fracture with routine healing: Secondary | ICD-10-CM | POA: Diagnosis not present

## 2016-10-01 DIAGNOSIS — M1990 Unspecified osteoarthritis, unspecified site: Secondary | ICD-10-CM | POA: Diagnosis not present

## 2016-10-02 DIAGNOSIS — M1990 Unspecified osteoarthritis, unspecified site: Secondary | ICD-10-CM | POA: Diagnosis not present

## 2016-10-02 DIAGNOSIS — S02401D Maxillary fracture, unspecified, subsequent encounter for fracture with routine healing: Secondary | ICD-10-CM | POA: Diagnosis not present

## 2016-10-02 DIAGNOSIS — I69320 Aphasia following cerebral infarction: Secondary | ICD-10-CM | POA: Diagnosis not present

## 2016-10-02 DIAGNOSIS — R69 Illness, unspecified: Secondary | ICD-10-CM | POA: Diagnosis not present

## 2016-10-02 DIAGNOSIS — S51812D Laceration without foreign body of left forearm, subsequent encounter: Secondary | ICD-10-CM | POA: Diagnosis not present

## 2016-10-02 DIAGNOSIS — I10 Essential (primary) hypertension: Secondary | ICD-10-CM | POA: Diagnosis not present

## 2016-10-06 DIAGNOSIS — S51812D Laceration without foreign body of left forearm, subsequent encounter: Secondary | ICD-10-CM | POA: Diagnosis not present

## 2016-10-06 DIAGNOSIS — S02401D Maxillary fracture, unspecified, subsequent encounter for fracture with routine healing: Secondary | ICD-10-CM | POA: Diagnosis not present

## 2016-10-06 DIAGNOSIS — I10 Essential (primary) hypertension: Secondary | ICD-10-CM | POA: Diagnosis not present

## 2016-10-06 DIAGNOSIS — R69 Illness, unspecified: Secondary | ICD-10-CM | POA: Diagnosis not present

## 2016-10-06 DIAGNOSIS — M1990 Unspecified osteoarthritis, unspecified site: Secondary | ICD-10-CM | POA: Diagnosis not present

## 2016-10-06 DIAGNOSIS — I69320 Aphasia following cerebral infarction: Secondary | ICD-10-CM | POA: Diagnosis not present

## 2016-10-07 ENCOUNTER — Encounter (HOSPITAL_COMMUNITY): Payer: Self-pay | Admitting: *Deleted

## 2016-10-07 ENCOUNTER — Emergency Department (HOSPITAL_COMMUNITY): Payer: MEDICARE

## 2016-10-07 ENCOUNTER — Inpatient Hospital Stay (HOSPITAL_COMMUNITY)
Admission: EM | Admit: 2016-10-07 | Discharge: 2016-10-09 | DRG: 092 | Payer: MEDICARE | Attending: Internal Medicine | Admitting: Internal Medicine

## 2016-10-07 DIAGNOSIS — Z79899 Other long term (current) drug therapy: Secondary | ICD-10-CM | POA: Diagnosis not present

## 2016-10-07 DIAGNOSIS — Z801 Family history of malignant neoplasm of trachea, bronchus and lung: Secondary | ICD-10-CM

## 2016-10-07 DIAGNOSIS — T402X5A Adverse effect of other opioids, initial encounter: Secondary | ICD-10-CM | POA: Diagnosis present

## 2016-10-07 DIAGNOSIS — R296 Repeated falls: Secondary | ICD-10-CM

## 2016-10-07 DIAGNOSIS — K219 Gastro-esophageal reflux disease without esophagitis: Secondary | ICD-10-CM | POA: Diagnosis present

## 2016-10-07 DIAGNOSIS — I69818 Other symptoms and signs involving cognitive functions following other cerebrovascular disease: Secondary | ICD-10-CM | POA: Diagnosis not present

## 2016-10-07 DIAGNOSIS — F0150 Vascular dementia without behavioral disturbance: Secondary | ICD-10-CM | POA: Diagnosis not present

## 2016-10-07 DIAGNOSIS — R4701 Aphasia: Secondary | ICD-10-CM | POA: Diagnosis present

## 2016-10-07 DIAGNOSIS — Z8679 Personal history of other diseases of the circulatory system: Secondary | ICD-10-CM | POA: Diagnosis not present

## 2016-10-07 DIAGNOSIS — I471 Supraventricular tachycardia, unspecified: Secondary | ICD-10-CM | POA: Diagnosis present

## 2016-10-07 DIAGNOSIS — Z880 Allergy status to penicillin: Secondary | ICD-10-CM | POA: Diagnosis not present

## 2016-10-07 DIAGNOSIS — I10 Essential (primary) hypertension: Secondary | ICD-10-CM | POA: Diagnosis present

## 2016-10-07 DIAGNOSIS — I4581 Long QT syndrome: Secondary | ICD-10-CM | POA: Diagnosis not present

## 2016-10-07 DIAGNOSIS — G92 Toxic encephalopathy: Principal | ICD-10-CM | POA: Diagnosis present

## 2016-10-07 DIAGNOSIS — T424X5A Adverse effect of benzodiazepines, initial encounter: Secondary | ICD-10-CM | POA: Diagnosis present

## 2016-10-07 DIAGNOSIS — Z7982 Long term (current) use of aspirin: Secondary | ICD-10-CM

## 2016-10-07 DIAGNOSIS — Z882 Allergy status to sulfonamides status: Secondary | ICD-10-CM | POA: Diagnosis not present

## 2016-10-07 DIAGNOSIS — I69320 Aphasia following cerebral infarction: Secondary | ICD-10-CM | POA: Diagnosis not present

## 2016-10-07 DIAGNOSIS — E871 Hypo-osmolality and hyponatremia: Secondary | ICD-10-CM | POA: Diagnosis present

## 2016-10-07 DIAGNOSIS — E039 Hypothyroidism, unspecified: Secondary | ICD-10-CM | POA: Diagnosis not present

## 2016-10-07 DIAGNOSIS — R4182 Altered mental status, unspecified: Secondary | ICD-10-CM | POA: Diagnosis not present

## 2016-10-07 DIAGNOSIS — E034 Atrophy of thyroid (acquired): Secondary | ICD-10-CM | POA: Diagnosis present

## 2016-10-07 DIAGNOSIS — D72829 Elevated white blood cell count, unspecified: Secondary | ICD-10-CM | POA: Diagnosis present

## 2016-10-07 DIAGNOSIS — G9340 Encephalopathy, unspecified: Secondary | ICD-10-CM | POA: Diagnosis not present

## 2016-10-07 DIAGNOSIS — Z8673 Personal history of transient ischemic attack (TIA), and cerebral infarction without residual deficits: Secondary | ICD-10-CM | POA: Diagnosis not present

## 2016-10-07 DIAGNOSIS — Z90710 Acquired absence of both cervix and uterus: Secondary | ICD-10-CM

## 2016-10-07 DIAGNOSIS — R29818 Other symptoms and signs involving the nervous system: Secondary | ICD-10-CM | POA: Diagnosis not present

## 2016-10-07 DIAGNOSIS — Z888 Allergy status to other drugs, medicaments and biological substances status: Secondary | ICD-10-CM | POA: Diagnosis not present

## 2016-10-07 DIAGNOSIS — E785 Hyperlipidemia, unspecified: Secondary | ICD-10-CM | POA: Diagnosis not present

## 2016-10-07 DIAGNOSIS — R404 Transient alteration of awareness: Secondary | ICD-10-CM | POA: Diagnosis not present

## 2016-10-07 DIAGNOSIS — M1990 Unspecified osteoarthritis, unspecified site: Secondary | ICD-10-CM | POA: Diagnosis present

## 2016-10-07 DIAGNOSIS — R030 Elevated blood-pressure reading, without diagnosis of hypertension: Secondary | ICD-10-CM | POA: Diagnosis not present

## 2016-10-07 DIAGNOSIS — R29810 Facial weakness: Secondary | ICD-10-CM

## 2016-10-07 DIAGNOSIS — Z9181 History of falling: Secondary | ICD-10-CM | POA: Diagnosis not present

## 2016-10-07 DIAGNOSIS — R69 Illness, unspecified: Secondary | ICD-10-CM | POA: Diagnosis not present

## 2016-10-07 DIAGNOSIS — I160 Hypertensive urgency: Secondary | ICD-10-CM | POA: Diagnosis present

## 2016-10-07 DIAGNOSIS — R079 Chest pain, unspecified: Secondary | ICD-10-CM | POA: Diagnosis not present

## 2016-10-07 LAB — I-STAT CHEM 8, ED
BUN: 10 mg/dL (ref 6–20)
CALCIUM ION: 1.02 mmol/L — AB (ref 1.15–1.40)
CHLORIDE: 94 mmol/L — AB (ref 101–111)
CREATININE: 0.7 mg/dL (ref 0.44–1.00)
GLUCOSE: 118 mg/dL — AB (ref 65–99)
HCT: 40 % (ref 36.0–46.0)
Hemoglobin: 13.6 g/dL (ref 12.0–15.0)
POTASSIUM: 3.7 mmol/L (ref 3.5–5.1)
Sodium: 131 mmol/L — ABNORMAL LOW (ref 135–145)
TCO2: 27 mmol/L (ref 0–100)

## 2016-10-07 LAB — COMPREHENSIVE METABOLIC PANEL
ALK PHOS: 81 U/L (ref 38–126)
ALT: 17 U/L (ref 14–54)
AST: 23 U/L (ref 15–41)
Albumin: 3.7 g/dL (ref 3.5–5.0)
Anion gap: 10 (ref 5–15)
BUN: 9 mg/dL (ref 6–20)
CALCIUM: 9.1 mg/dL (ref 8.9–10.3)
CO2: 27 mmol/L (ref 22–32)
CREATININE: 0.68 mg/dL (ref 0.44–1.00)
Chloride: 94 mmol/L — ABNORMAL LOW (ref 101–111)
GFR calc non Af Amer: >60 mL/min (ref >60)
Glucose, Bld: 119 mg/dL — ABNORMAL HIGH (ref 65–99)
Potassium: 3.8 mmol/L (ref 3.5–5.1)
Sodium: 131 mmol/L — ABNORMAL LOW (ref 135–145)
Total Bilirubin: 0.7 mg/dL (ref 0.3–1.2)
Total Protein: 6.6 g/dL (ref 6.5–8.1)

## 2016-10-07 LAB — DIFFERENTIAL
BASOS ABS: 0 10*3/uL (ref 0.0–0.1)
Basophils Relative: 0 %
Eosinophils Absolute: 0.2 10*3/uL (ref 0.0–0.7)
Eosinophils Relative: 1 %
LYMPHS PCT: 23 %
Lymphs Abs: 2.8 10*3/uL (ref 0.7–4.0)
MONO ABS: 0.9 10*3/uL (ref 0.1–1.0)
MONOS PCT: 7 %
NEUTROS ABS: 8.4 10*3/uL — AB (ref 1.7–7.7)
Neutrophils Relative %: 69 %

## 2016-10-07 LAB — I-STAT TROPONIN, ED: Troponin i, poc: 0.01 ng/mL (ref 0.00–0.08)

## 2016-10-07 LAB — CBC
HEMATOCRIT: 39.5 % (ref 36.0–46.0)
Hemoglobin: 12.6 g/dL (ref 12.0–15.0)
MCH: 28.4 pg (ref 26.0–34.0)
MCHC: 31.9 g/dL (ref 30.0–36.0)
MCV: 89.2 fL (ref 78.0–100.0)
PLATELETS: 269 10*3/uL (ref 150–400)
RBC: 4.43 MIL/uL (ref 3.87–5.11)
RDW: 14.8 % (ref 11.5–15.5)
WBC: 12.3 10*3/uL — AB (ref 4.0–10.5)

## 2016-10-07 LAB — PROTIME-INR
INR: 0.96
Prothrombin Time: 12.7 seconds (ref 11.4–15.2)

## 2016-10-07 LAB — APTT: aPTT: 27 seconds (ref 24–36)

## 2016-10-07 MED ORDER — METOPROLOL TARTRATE 5 MG/5ML IV SOLN
2.5000 mg | INTRAVENOUS | Status: DC | PRN
  Administered 2016-10-08: 2.5 mg via INTRAVENOUS
  Filled 2016-10-07: qty 5

## 2016-10-07 NOTE — ED Notes (Signed)
Neurologist back in to see the pt

## 2016-10-07 NOTE — ED Notes (Signed)
Pt non verbal  To me opens her eyes then closes them

## 2016-10-07 NOTE — ED Notes (Signed)
The pt makes eye contact does not answer any questions asked.  No rt arm drift   No effort to keep lt arm up  Yet a few seconds later she raised her arm and hand to scratch the lt side side of her face  No effort to keep either leg raised

## 2016-10-07 NOTE — H&P (Signed)
Date: 10/08/2016               Patient Name:  Ebony [name] MRN: [id number]  DOB: [birth year] Age / Sex: 83 y.o., female   PCP: Florentina Jenny, MD         Medical Service: Internal Medicine Teaching Service         Attending Physician: Dr. Doneen Poisson, MD    First Contact: Dr. Rozann Lesches Pager: [phone]  Second Contact: Dr. Valentino Nose Pager: [phone]       After Hours (After 5p/  First Contact Pager: [phone]  weekends / holidays): Second Contact Pager: [phone]   Chief Complaint: Acute Encephalopathy   History of Present Illness: 83 yo female with PMH of ischemic stroke, SAH, multiple falls,  Acute encephalopathy, Hypothyroidism, vascular dementia, expressive aphasia, depression, presents to ED with Altered mental status increased from baseline.  Pt was found slumped against the door of her room by Staff at her SNF, was not responding to commands, and was noted to have left-sided facial droop.   ED course: SBPs in 220s via EMS, Initial evaluation in emergency room bridge, pt was awake, alert, followed all commands had an abnormal affect but was not aphasic.  Stat Non contrast CT was unremarkable. CMP showed a miild hyponatremia at 131, CBC shows mild leukocytosis, troponin negative x1, chest x ray neg, MRI with no acute process, moderate to severe atrophy, chronic small vessel disease old left frontal lobe infarct.        Meds:  Current Meds  Medication Sig  . acetaminophen (TYLENOL) 500 MG tablet Take 500 mg by mouth 2 (two) times daily.  Marland Kitchen ALPRAZolam (XANAX) 0.25 MG tablet Take 0.25 mg by mouth 2 (two) times daily.  Marland Kitchen aspirin 81 MG chewable tablet Chew 81 mg by mouth daily.  Marland Kitchen Dextran 70-Hypromellose (ARTIFICIAL TEARS) 0.1-0.3 % SOLN Place 1 drop into both eyes every morning.   . DULoxetine (CYMBALTA) 20 MG capsule Take 20 mg by mouth See admin instructions.   . fluticasone (FLONASE) 50 MCG/ACT nasal spray Place 2 sprays into both nostrils daily.  Marland Kitchen  HYDROcodone-acetaminophen (NORCO/VICODIN) 5-325 MG tablet Take one tablet by mouth every 8 hours as needed for pain  . ibuprofen (ADVIL,MOTRIN) 200 MG tablet Take 200 mg by mouth every 12 (twelve) hours as needed (for pain).   Marland Kitchen levothyroxine (SYNTHROID, LEVOTHROID) 25 MCG tablet Take 1 tablet (25 mcg total) by mouth daily before breakfast. (Patient taking differently: Take 25 mcg by mouth at bedtime. )  . lisinopril (PRINIVIL,ZESTRIL) 20 MG tablet Take 1 tablet (20 mg total) by mouth daily.  . magnesium hydroxide (MILK OF MAGNESIA) 400 MG/5ML suspension Take 30 mLs by mouth daily as needed for mild constipation.  . Melatonin 3 MG CAPS By mouth qhs for sleep  . Menthol, Topical Analgesic, (BIOFREEZE) 4 % GEL Apply 1 application topically every 8 (eight) hours as needed (knee pain).  . metoprolol (TOPROL-XL) 100 MG 24 hr tablet Take 150 mg by mouth every morning. for HTN.  . Multiple Vitamins-Minerals (THERA-M PO) Take 1 tablet by mouth every morning.  . ondansetron (ZOFRAN) 4 MG tablet Take 4 mg by mouth every 6 (six) hours as needed for nausea or vomiting.  . pantoprazole (PROTONIX) 20 MG tablet Take 20 mg by mouth at bedtime.   . Psyllium 500 MG CAPS Take 500 mg by mouth at bedtime.  . saccharomyces boulardii (FLORASTOR) 250 MG capsule Take 250 mg by mouth 2 (two) times  daily.  . senna (SENOKOT) 8.6 MG TABS tablet Take 1 tablet by mouth See admin instructions. Mon. Wed. And Friday for constipation     Allergies: Allergies as of 10/07/2016 - Review Complete 10/07/2016  Allergen Reaction Noted  . Sulfa antibiotics Diarrhea 12/25/2010  . Ace inhibitors  08/24/2012  . Biaxin [clarithromycin] Diarrhea 08/24/2012  . Penicillins Diarrhea 12/25/2010  . Klor-con [potassium chloride] Other (See Comments) 08/31/2016  . Cozaar [losartan potassium] Rash 08/24/2012   Past Medical History:  Diagnosis Date  . Acute ischemic stroke (HCC) 04/11/2012  . Allergic rhinitis   . Anxiety   . Arthritis     Knees  . Depression   . Fibrocystic breast disease   . Hiatal hernia   . Hyperlipidemia   . Hypertension   . IFG (impaired fasting glucose)   . SDH (subdural hematoma) (HCC) 05/16/2011  . Subarachnoid hemorrhage following injury (HCC) 05/16/2011  . Subarachnoid hemorrhage following injury (HCC) 05/16/2011  . Unspecified hypothyroidism 04/15/2012    Family History:  Family History  Problem Relation Age of Onset  . Lung cancer Sister   . CVA Neg Hx   . Diabetes Neg Hx   . Hypertension Neg Hx     Social History:  Social History   Social History  . Marital status: Married    Spouse name: N/A  . Number of children: N/A  . Years of education: N/A   Occupational History  . Not on file.   Social History Main Topics  . Smoking status: Never Smoker  . Smokeless tobacco: Never Used  . Alcohol use No  . Drug use: No  . Sexual activity: No   Other Topics Concern  . Not on file   Social History Narrative  . No narrative on file    Review of Systems: Review of Systems  Unable to perform ROS: Mental status change    Physical Exam: Blood pressure (!) 204/95, pulse 66, temperature 98.1 F (36.7 C), resp. rate (!) 22, height 5' (1.524 m), weight 151 lb (68.5 kg), SpO2 94 %.  Physical Exam  Constitutional: She appears well-developed and well-nourished.  HENT:  Head: Normocephalic and atraumatic.  Eyes: Pupils are equal, round, and reactive to light. Right eye exhibits no discharge. Left eye exhibits no discharge. No scleral icterus.  Neck: No JVD present.  Cardiovascular: Normal rate, regular rhythm and normal heart sounds.   No murmur heard. Pulmonary/Chest: Effort normal and breath sounds normal. She has no wheezes. She has no rales.  Abdominal: Soft. She exhibits no distension. There is no tenderness. There is no rebound and no guarding.  Neurological: GCS eye subscore is 3. GCS verbal subscore is 1. GCS motor subscore is 6.  Pt responds poorly to commands, difficult to  perform neuro exam, pt squeezes my hand lightly with her left hand will not move her right.  Withdrawals from pain on lower extremities but will not move on command.      EKG: personally reviewed my interpretation is ecg ordered but not resulted  CXR: personally reviewed my interpretation is normal heart size, clear lung fields with no evidence of infiltrate, consolidation, no bone abnormalities  Assessment & Plan by Problem: Principal Problem:   Acute encephalopathy Active Problems:   Essential hypertension   Hypothyroidism due to acquired atrophy of thyroid   Vascular dementia without behavioral disturbance   GERD (gastroesophageal reflux disease)   Expressive aphasia   Recurrent falls   Paroxysmal supraventricular tachycardia (HCC)   Hyponatremia  Hypertensive urgency  83 yo female with PMH of ischemic stroke, SAH, multiple falls,  Acute encephalopathy, Hypothyroidism, vascular dementia, expressive aphasia, depression, presents to ED with Altered mental status increased from baseline and hypertensive urgency.     Acute Encephalopathy/expressive aphasia/vascular dementia: Pt has had multiple injuries to her brain, has diffuse cerebral atrophy, has been evaluated for similar presentations in past with normal brain imaging.  DDX with plan below    Vascular: CT and MRI negative for acute process  Infectious, Inflammatory: pt has mild leukocytosis no clinical source has presented itself thus far, urinalysis pending, white count could be a product of pt having a seizure, checking CK, EEG, hypertensive encephalopathy a possiblity will lower BP with goal of <180  degenerative: possibly pts degenerative changes to brain have progressed to waxing and waning functionality  iatrogenic, intoxication: polypharmacy a possibility due to pt being on many medications xanax, vicodin most concerning  congenital: N/A  autoimmune: less likely based on history and symptoms Traumatic: pt has history of  falls however CT and MRI negative for signs of traumatic brain injury or bleed  endocrine, metabolic: CMP grossly normal so far other than mild hyponatremia, TSH normal 3 months ago,   -NPO -Speech evaluation -Serial neuro exams/mental status exams  Hypertensive Urgency: no signs of end organ damage, difficult to assess whether contributing to decreased mentation but unlikely.  Will treat with a goal of around 180 systolic  -IV labetalol 20mg  q2315min PRN SBP <180 as goal   Hyponatremia: mild at 131, appears to be her baseline for the last month per chart.    -NS 1575mL/hr  Paroxysmal SVT: pt has been in sinus rhythm since arrival to ED  -Keep pt on telemetry -Keep pt on some form of beta blockade as she is on metoprolol 100mg  XL as home med  Hypothyroidism: last TSH 0.9,  3 months ago   -continue home synthroid 25mcg qdaily    Dispo: Admit patient to Inpatient with expected length of stay greater than 2 midnights.  Signed: Angelita InglesWinfrey, Gloriann Riede B, MD 10/08/2016, 12:52 AM  Thornell MuleBrandon Essynce Munsch MD PGY-1 Internal Medicine Pager # [phone]

## 2016-10-07 NOTE — Consult Note (Addendum)
. Neurology Consultation  Reason for Consult: Acute code stroke Referring Physician: Dr. Stefanie Libel  CC: Unresponsive, left facial droop  History is obtained from: EMS  HPI: Ebony [name] is a 83 y.o. female who has a past medical history of stroke in 2014, subdural and subarachnoid hemorrhages in the past, anxiety depression, hypertension hyperlipidemia and hypothyroidism who is a resident of nursing home, was last seen normal at around 6 PM and brought to the hospital for the concern of strokelike symptoms. She was noted to have left-sided facial droop and was unresponsive to voice and commands. The staff had last seen her normal 6 PM per the EMS report. When they noted the patient with the droop as well as unresponsiveness, they called EMS, who brought the patient into the hospital as an acute code stroke. Upon initial evaluation at the emergency room bridge, the patient was awake, alert, followed all commands. Her affect was definitely abnormal but she was not aphasic. There was probable left nasolabial fold flattening compared to the right. She was taken for a stat noncontrast CT of the head, which was unremarkable. Patient has been seen in the past, most recently in April 2018, for evaluation of word finding difficulty. She has a history of subarachnoid along with a right subdural hemorrhage secondary to fall in the past.  Unclear about her baseline speech. SBP per EMS 220s. Seen in July with left sided complaints.  LKW: 1800 hrs. tpa given?: no, low NIH, unclear if this is a stroke or not Premorbid modified Rankin scale (mRS):  3-Moderate disability-requires help but walks WITHOUT assistance   ROS: A 14 point ROS was performed and is negative except as noted in the HPI.     Past Medical History:  Diagnosis Date  . Acute ischemic stroke (HCC) 04/11/2012  . Allergic rhinitis   . Anxiety   . Arthritis    Knees  . Depression   . Fibrocystic breast disease   . Hiatal hernia   .  Hyperlipidemia   . Hypertension   . IFG (impaired fasting glucose)   . SDH (subdural hematoma) (HCC) 05/16/2011  . Subarachnoid hemorrhage following injury (HCC) 05/16/2011  . Subarachnoid hemorrhage following injury (HCC) 05/16/2011  . Unspecified hypothyroidism 04/15/2012     Family History  Problem Relation Age of Onset  . Lung cancer Sister   . CVA Neg Hx   . Diabetes Neg Hx   . Hypertension Neg Hx      Social History:   reports that she has never smoked. She has never used smokeless tobacco. She reports that she does not drink alcohol or use drugs.  Medications No current facility-administered medications for this encounter.   Current Outpatient Prescriptions:  .  acetaminophen (TYLENOL) 500 MG tablet, Take 500 mg by mouth 2 (two) times daily., Disp: , Rfl:  .  ALPRAZolam (XANAX) 0.25 MG tablet, Take 0.25 mg by mouth 2 (two) times daily., Disp: , Rfl:  .  aspirin 81 MG chewable tablet, Chew 81 mg by mouth daily., Disp: , Rfl:  .  Dextran 70-Hypromellose (ARTIFICIAL TEARS) 0.1-0.3 % SOLN, Place 1 drop into both eyes every morning. , Disp: , Rfl:  .  DULoxetine (CYMBALTA) 20 MG capsule, Take 20 mg by mouth See admin instructions. , Disp: , Rfl:  .  fluticasone (FLONASE) 50 MCG/ACT nasal spray, Place 2 sprays into both nostrils daily., Disp: 16 g, Rfl: 6 .  HYDROcodone-acetaminophen (NORCO/VICODIN) 5-325 MG tablet, Take one tablet by mouth every 8 hours  as needed for pain, Disp: 7 tablet, Rfl: 0 .  ibuprofen (ADVIL,MOTRIN) 200 MG tablet, Take 200 mg by mouth every 12 (twelve) hours as needed (for pain). , Disp: , Rfl:  .  levothyroxine (SYNTHROID, LEVOTHROID) 25 MCG tablet, Take 1 tablet (25 mcg total) by mouth daily before breakfast. (Patient taking differently: Take 25 mcg by mouth at bedtime. ), Disp: , Rfl:  .  lisinopril (PRINIVIL,ZESTRIL) 20 MG tablet, Take 1 tablet (20 mg total) by mouth daily., Disp: 30 tablet, Rfl: 0 .  magnesium hydroxide (MILK OF MAGNESIA) 400 MG/5ML  suspension, Take 30 mLs by mouth daily as needed for mild constipation., Disp: , Rfl:  .  Melatonin 3 MG CAPS, By mouth qhs for sleep, Disp: , Rfl: 0 .  Menthol, Topical Analgesic, (BIOFREEZE) 4 % GEL, Apply 1 application topically every 8 (eight) hours as needed (knee pain)., Disp: , Rfl:  .  metoprolol (TOPROL-XL) 100 MG 24 hr tablet, Take 150 mg by mouth every morning. for HTN., Disp: , Rfl:  .  Multiple Vitamins-Minerals (THERA-M PO), Take 1 tablet by mouth every morning., Disp: , Rfl:  .  ondansetron (ZOFRAN) 4 MG tablet, Take 4 mg by mouth every 6 (six) hours as needed for nausea or vomiting., Disp: , Rfl:  .  pantoprazole (PROTONIX) 20 MG tablet, Take 20 mg by mouth at bedtime. , Disp: , Rfl:  .  Psyllium 500 MG CAPS, Take 500 mg by mouth at bedtime., Disp: , Rfl:  .  saccharomyces boulardii (FLORASTOR) 250 MG capsule, Take 250 mg by mouth 2 (two) times daily., Disp: , Rfl:  .  senna (SENOKOT) 8.6 MG TABS tablet, Take 1 tablet by mouth See admin instructions. Mon. Wed. And Friday for constipation, Disp: , Rfl:   Exam: Current vital signs: BP (!) 196/107   Pulse 66   Temp 98.1 F (36.7 C)   Resp 20   Ht 5' (1.524 m)   Wt 68.5 kg (151 lb)   SpO2 93%   BMI 29.49 kg/m  Vital signs in last 24 hours: Temp:  [98.1 F (36.7 C)] 98.1 F (36.7 C) (08/08 2125) Pulse Rate:  [66-68] 66 (08/08 2215) Resp:  [17-23] 20 (08/08 2215) BP: (182-196)/(102-107) 196/107 (08/08 2215) SpO2:  [93 %-96 %] 93 % (08/08 2215) Weight:  [68.5 kg (151 lb)-68.5 kg (151 lb 0.2 oz)] 68.5 kg (151 lb) (08/08 2116)  GENERAL: Awake, alert in NAD HEENT: - Normocephalic and atraumatic, dry mm, no LN++, no Thyromegally LUNGS - Clear to auscultation bilaterally with no wheezes CV - S1S2 RRR, no m/r/g, equal pulses bilaterally. ABDOMEN - Soft, nontender, nondistended with normoactive BS Ext: warm, well perfused, intact peripheral pulses, no edema  NEURO:  Mental Status: Awake alert and oriented to self. Not  oriented to place or time. Language: speech is slow but clear-not dysarthric.  Naming, repetition, fluency, and comprehension intact. Cranial Nerves: PERRL 30mm/brisk. EOMI, visual fields full, mild facial asymmetry with possible left nasolabial fold flattening., facial sensation intact, hearing intact, tongue/uvula/soft palate midline, normal sternocleidomastoid and trapezius muscle strength. No evidence of tongue atrophy or fibrillations Motor: Symmetric 4+/5 all over Tone: is normal and bulk is normal Sensation- Intact to light touch bilaterally Coordination: FTN intact bilaterally, no ataxia in BLE. Gait- deferred  NIHSS -- 3 (2 for LOC questions, 1 for facial)   Labs I have reviewed labs in epic and the results pertinent to this consultation are:   CBC    Component Value Date/Time  WBC 12.3 (H) 10/07/2016 2057   RBC 4.43 10/07/2016 2057   HGB 13.6 10/07/2016 2124   HCT 40.0 10/07/2016 2124   PLT 269 10/07/2016 2057   MCV 89.2 10/07/2016 2057   MCH 28.4 10/07/2016 2057   MCHC 31.9 10/07/2016 2057   RDW 14.8 10/07/2016 2057   LYMPHSABS 2.8 10/07/2016 2057   MONOABS 0.9 10/07/2016 2057   EOSABS 0.2 10/07/2016 2057   BASOSABS 0.0 10/07/2016 2057    CMP     Component Value Date/Time   NA 131 (L) 10/07/2016 2124   NA 141 07/18/2014   K 3.7 10/07/2016 2124   CL 94 (L) 10/07/2016 2124   CO2 27 10/07/2016 2057   GLUCOSE 118 (H) 10/07/2016 2124   BUN 10 10/07/2016 2124   BUN 13 07/18/2014   CREATININE 0.70 10/07/2016 2124   CALCIUM 9.1 10/07/2016 2057   PROT 6.6 10/07/2016 2057   ALBUMIN 3.7 10/07/2016 2057   AST 23 10/07/2016 2057   ALT 17 10/07/2016 2057   ALKPHOS 81 10/07/2016 2057   BILITOT 0.7 10/07/2016 2057   GFRNONAA >60 10/07/2016 2057   GFRAA >60 10/07/2016 2057    Lipid Panel     Component Value Date/Time   CHOL 189 06/25/2016 0805   TRIG 189 (H) 06/25/2016 0805   HDL 46 06/25/2016 0805   CHOLHDL 4.1 06/25/2016 0805   VLDL 38 06/25/2016 0805    LDLCALC 105 (H) 06/25/2016 0805     Imaging I have reviewed the images obtained:Noncontrast CT of the head does not show any acute changes. Shows small chronic left anterior frontal infarct, microvascular ischemic changes and generalized volume loss.  Assessment:  This is a 83 year old woman with a past medical history ofStroke in 2014, subdural and subarachnoid hemorrhages in the past probably from trauma, anxiety, depression, hypertension, hyperlipidemia, hypothyroidism who lives in a nursing home as was noted to have left facial droop and unresponsiveness to voice and commands. Her last seen normal was 6 PM and she was brought into the hospital around 8:46 PM. Initial evaluation was nonfocal and consistent with encephalopathy. Noncontrast CT of the head did not show any acute changes of evolving ischemic stroke or any evidence of bleed. She has been evaluated in the past for some nonspecific neurological symptoms with negative brain imaging. It was deemed that she has a component of delirium possibly related to her declining cognitive function. It is my suspicion that these episodes are either behavioral or related to the delirium. Differentials to also consider are seizure and stroke. Also has some toxic metabolic derangements including hyponatremia and leukocytosis.  Impression: Evaluate for stroke Evaluate for seizure Toxic metabolic encephalopathy Delirium HTN urgency  Recommendations: MRI brain without contrast No indication for antiepileptic medication at this time. Obtain an EEG. If there is abnormality on EEG and her neurological symptoms persist or she exhibits any overt seizure activity, can consider using antiepileptic therapy. Antiplatelets and statin for stroke prevention. Minimize sedating medications Gradual SBP reduction per ER/IM Correction of toxic metabolic derangements per primary team.  -- Milon DikesAshish Tandrea Kommer, MD [facility] [phone]  If 7pm to  7am, please call on call as listed on AMION.   2324 hrs - Addendum Called to reassess patient. Less responsive to voice per nursing. She would open eyes to voice, follow some commands. More lethargic. Ordered stat MRI brain. Called MRI, they will be taking her on next. Will follow after MRI.  0123 hrs - Addendum MRI completed. No evidence of acute stroke. As  mentioned above, clinical picture might be toxic/metabolic encephalopathy vs. HTN urgency. Can also consider seizures from the old Rt. Sided SDH, causing left sided seizures and residual Todd's paralysis that can be responsible for the observed left facial weakness. Rec: -Correct hyponatremia -Check UA -await EEG -Blood pressure management as above. -She is on multiple sedating meds (Xanax, Cymbalta, Norco) - minimize sedating medications. Neurology will follow with you in the AM. Further recs pending clinical course and test results.  Milon Dikes, MD [facility] [phone]  If 7pm to 7am, please call on call as listed on AMION.

## 2016-10-07 NOTE — ED Provider Notes (Signed)
[facility]-EMERGENCY DEPT Provider Note   CSN: [id number] Arrival date & time: 10/07/16  2114   An emergency department physician performed an initial assessment on this suspected stroke patient at 2055.  History   Chief Complaint Chief Complaint  Patient presents with  . Code Stroke    HPI Ebony [name] is a 83 y.o. female.  HPI   Patient is a 83 year old female past medical history significant for advanced dementia, occasional expressive dysphagia, frequent falls, old subarachnoid hemorrhage, hypertension. She is presenting today with increased altered mental status. Patient found slumped against the door of her room by staff. She is having trouble communicating more than usual. Sent here for further evaluation.  Past Medical History:  Diagnosis Date  . Acute ischemic stroke (HCC) 04/11/2012  . Allergic rhinitis   . Anxiety   . Arthritis    Knees  . Depression   . Fibrocystic breast disease   . Hiatal hernia   . Hyperlipidemia   . Hypertension   . IFG (impaired fasting glucose)   . SDH (subdural hematoma) (HCC) 05/16/2011  . Subarachnoid hemorrhage following injury (HCC) 05/16/2011  . Subarachnoid hemorrhage following injury (HCC) 05/16/2011  . Unspecified hypothyroidism 04/15/2012    Patient Active Problem List   Diagnosis Date Noted  . Paroxysmal supraventricular tachycardia (HCC)   . Recurrent falls 09/04/2016  . Maxillary sinus fracture (HCC) 09/04/2016  . Closed fracture of nasal bone 09/04/2016  . Altered mental status 09/04/2016  . Acute encephalopathy   . Expressive aphasia   . Ischemic stroke (HCC) 06/24/2016  . Depression with anxiety 06/24/2016  . Hypothyroid 06/24/2016  . GERD (gastroesophageal reflux disease) 06/24/2016  . Allergic rhinitis 06/08/2014  . DDD (degenerative disc disease), lumbar 06/08/2014  . Insomnia 12/07/2013  . Depression   . OA (osteoarthritis) 01/06/2013  . Loss of weight 07/13/2012  . Vascular dementia without behavioral  disturbance 06/20/2012  . Hypothyroidism due to acquired atrophy of thyroid 04/15/2012  . SVT (supraventricular tachycardia) (HCC) 04/12/2012  . Confusion 04/10/2012  . Fall 05/16/2011  . Essential hypertension 05/16/2011  . Anxiety disorder 05/16/2011    Past Surgical History:  Procedure Laterality Date  . ABDOMINAL HYSTERECTOMY    . BREAST SURGERY    . EYE SURGERY    . TONSILLECTOMY      OB History    No data available       Home Medications    Prior to Admission medications   Medication Sig Start Date End Date Taking? Authorizing Provider  acetaminophen (TYLENOL) 500 MG tablet Take 500 mg by mouth 2 (two) times daily.   Yes [provider]  ALPRAZolam (XANAX) 0.25 MG tablet Take 0.25 mg by mouth 2 (two) times daily. 09/16/16  Yes [provider]  aspirin 81 MG chewable tablet Chew 81 mg by mouth daily.   Yes [provider]  Dextran 70-Hypromellose (ARTIFICIAL TEARS) 0.1-0.3 % SOLN Place 1 drop into both eyes every morning.    Yes [provider]  DULoxetine (CYMBALTA) 20 MG capsule Take 20 mg by mouth See admin instructions.    Yes [provider]  fluticasone (FLONASE) 50 MCG/ACT nasal spray Place 2 sprays into both nostrils daily. 06/08/14  Yes Sharon Seller, NP  HYDROcodone-acetaminophen (NORCO/VICODIN) 5-325 MG tablet Take one tablet by mouth every 8 hours as needed for pain 06/26/16  Yes Maxie Barb, MD  ibuprofen (ADVIL,MOTRIN) 200 MG tablet Take 200 mg by mouth every 12 (twelve) hours as needed (for pain).  Yes [provider]  levothyroxine (SYNTHROID, LEVOTHROID) 25 MCG tablet Take 1 tablet (25 mcg total) by mouth daily before breakfast. Patient taking differently: Take 25 mcg by mouth at bedtime.  04/15/12  Yes Christiane Ha, MD  lisinopril (PRINIVIL,ZESTRIL) 20 MG tablet Take 1 tablet (20 mg total) by mouth daily. 09/08/16  Yes Helberg, Jill Alexanders, MD  magnesium hydroxide (MILK OF MAGNESIA) 400  MG/5ML suspension Take 30 mLs by mouth daily as needed for mild constipation.   Yes [provider]  Melatonin 3 MG CAPS By mouth qhs for sleep 11/17/13  Yes Sharon Seller, NP  Menthol, Topical Analgesic, (BIOFREEZE) 4 % GEL Apply 1 application topically every 8 (eight) hours as needed (knee pain).   Yes [provider]  metoprolol (TOPROL-XL) 100 MG 24 hr tablet Take 150 mg by mouth every morning. for HTN.   Yes [provider]  Multiple Vitamins-Minerals (THERA-M PO) Take 1 tablet by mouth every morning.   Yes [provider]  ondansetron (ZOFRAN) 4 MG tablet Take 4 mg by mouth every 6 (six) hours as needed for nausea or vomiting.   Yes [provider]  pantoprazole (PROTONIX) 20 MG tablet Take 20 mg by mouth at bedtime.    Yes [provider]  Psyllium 500 MG CAPS Take 500 mg by mouth at bedtime.   Yes [provider]  saccharomyces boulardii (FLORASTOR) 250 MG capsule Take 250 mg by mouth 2 (two) times daily.   Yes [provider]  senna (SENOKOT) 8.6 MG TABS tablet Take 1 tablet by mouth See admin instructions. Mon. Wed. And Friday for constipation   Yes [provider]    Family History Family History  Problem Relation Age of Onset  . Lung cancer Sister   . CVA Neg Hx   . Diabetes Neg Hx   . Hypertension Neg Hx     Social History Social History  Substance Use Topics  . Smoking status: Never Smoker  . Smokeless tobacco: Never Used  . Alcohol use No     Allergies   Sulfa antibiotics; Ace inhibitors; Biaxin [clarithromycin]; Penicillins; Klor-con [potassium chloride]; and Cozaar [losartan potassium]   Review of Systems Review of Systems  Constitutional: Negative for activity change.  Respiratory: Negative for shortness of breath.   Cardiovascular: Negative for chest pain.  Gastrointestinal: Negative for abdominal pain.  Psychiatric/Behavioral: Positive for behavioral problems and confusion.      Physical Exam Updated Vital Signs BP (!) 196/107   Pulse 66   Temp 98.1 F (36.7 C)   Resp 20   Ht 5' (1.524 m)   Wt 68.5 kg (151 lb)   SpO2 93%   BMI 29.49 kg/m   Physical Exam  Constitutional: She appears well-developed and well-nourished.  HENT:  Head: Normocephalic and atraumatic.  Eyes: Right eye exhibits no discharge. Left eye exhibits no discharge.  Cardiovascular: Normal rate, regular rhythm and normal heart sounds.   No murmur heard. Pulmonary/Chest: Effort normal and breath sounds normal. She has no wheezes. She has no rales.  Abdominal: Soft. She exhibits no distension. There is no tenderness.  Neurological: A cranial nerve deficit is present.  Patient appears altered. Able to follow commands. Facial droop on the left  Skin: Skin is warm and dry. She is not diaphoretic.  Psychiatric: She has a normal mood and affect.  Nursing note and vitals reviewed.    ED Treatments / Results  Labs (all labs ordered are listed, but only abnormal results  are displayed) Labs Reviewed  CBC - Abnormal; Notable for the following:       Result Value   WBC 12.3 (*)    All other components within normal limits  DIFFERENTIAL - Abnormal; Notable for the following:    Neutro Abs 8.4 (*)    All other components within normal limits  COMPREHENSIVE METABOLIC PANEL - Abnormal; Notable for the following:    Sodium 131 (*)    Chloride 94 (*)    Glucose, Bld 119 (*)    All other components within normal limits  I-STAT CHEM 8, ED - Abnormal; Notable for the following:    Sodium 131 (*)    Chloride 94 (*)    Glucose, Bld 118 (*)    Calcium, Ion 1.02 (*)    All other components within normal limits  PROTIME-INR  APTT  URINALYSIS, ROUTINE W REFLEX MICROSCOPIC  I-STAT TROPONIN, ED  CBG MONITORING, ED    EKG  EKG Interpretation None       Radiology Ct Head Code Stroke Wo Contrast`  Result Date: 10/07/2016 CLINICAL DATA:  Code stroke. 83 y/o F; left-sided facial droop  and history of old CVA. EXAM: CT HEAD WITHOUT CONTRAST TECHNIQUE: Contiguous axial images were obtained from the base of the skull through the vertex without intravenous contrast. COMPARISON:  09/04/2016 CT head FINDINGS: Brain: Stable small anterior left frontal cortical chronic infarction. Moderate chronic microvascular ischemic changes of white matter and parenchymal volume loss of the brain for age. No evidence for large acute infarct, intracranial hemorrhage, or focal mass effect. Ventricle size is commensurate to the degree of volume loss and stable. No midline shift, herniation, or extra-axial collection. Vascular: No hyperdense vessel identified. Calcific atherosclerosis of carotid siphons. Skull: Normal. Negative for fracture or focal lesion. Sinuses/Orbits: No acute finding. Other: Bilateral intra-ocular lens replacement. ASPECTS [facility]([place] Stroke Program Early CT Score) - Ganglionic level infarction (caudate, lentiform nuclei, internal capsule, insula, M1-M3 cortex): 7 - Supraganglionic infarction (M4-M6 cortex): 3 Total score (0-10 with 10 being normal): 10 IMPRESSION: 1. No acute intracranial abnormality identified. 2. ASPECTS is 10. 3. Small chronic left anterior frontal cortical infarction, moderate chronic microvascular ischemic changes of the brain, and parenchymal volume loss. 4. Interval diminution in size of left frontal scalp contusion. These results were called by telephone at the time of interpretation on 10/07/2016 at 9:15 pm to Dr. Laurence SlateAroor, who verbally acknowledged these results. Electronically Signed   By: Mitzi HansenLance  Furusawa-Stratton M.D.   On: 10/07/2016 21:16    Procedures Procedures (including critical care time)  Medications Ordered in ED Medications - No data to display   Initial Impression / Assessment and Plan / ED Course  I have reviewed the triage vital signs and the nursing notes.  Pertinent labs & imaging results that were available during my care of the patient were  reviewed by me and considered in my medical decision making (see chart for details).    Patient is an 83 year old is a code stroke. Patient not felt to be a good TPA candidate. Seen by neurology. We'll continue his metabolic workup and then plan to admit for MRI, altered mental status workup. Discussed with neurology. They will get a stat MRI. It is unclear because patient's neurologic exam is very inconsistent. She cannot lift her arm against gravity and command. However she itches her face or spine. Unclear whether she is having seizure-like activity.  11:27 PM She noted be hypertensive we will give intermittent Lopressor. Patient's current blood pressures 176/115.  Patient's initial blood pressure 210. We do not want to lower blood pressure too much. Goal of 180 systolic.     Final Clinical Impressions(s) / ED Diagnoses   Final diagnoses:  Facial droop    New Prescriptions New Prescriptions   No medications on file     Abelino Derrick, MD 10/07/16 2333

## 2016-10-07 NOTE — ED Triage Notes (Signed)
Per EMS: Pt was LSW at 1800. Found by staff leaning against her door.  Pt was responsive to name.  Pt shows signs of aphasia and left sided facial droop. No c/o HA or CP.  BP on scene 202/130.

## 2016-10-08 ENCOUNTER — Emergency Department (HOSPITAL_COMMUNITY): Payer: MEDICARE

## 2016-10-08 ENCOUNTER — Other Ambulatory Visit: Payer: Self-pay

## 2016-10-08 ENCOUNTER — Encounter (HOSPITAL_COMMUNITY): Payer: Self-pay | Admitting: Internal Medicine

## 2016-10-08 DIAGNOSIS — Z801 Family history of malignant neoplasm of trachea, bronchus and lung: Secondary | ICD-10-CM

## 2016-10-08 DIAGNOSIS — M1990 Unspecified osteoarthritis, unspecified site: Secondary | ICD-10-CM | POA: Diagnosis present

## 2016-10-08 DIAGNOSIS — Z9181 History of falling: Secondary | ICD-10-CM | POA: Diagnosis not present

## 2016-10-08 DIAGNOSIS — I160 Hypertensive urgency: Secondary | ICD-10-CM | POA: Diagnosis not present

## 2016-10-08 DIAGNOSIS — I69818 Other symptoms and signs involving cognitive functions following other cerebrovascular disease: Secondary | ICD-10-CM | POA: Diagnosis not present

## 2016-10-08 DIAGNOSIS — I69320 Aphasia following cerebral infarction: Secondary | ICD-10-CM

## 2016-10-08 DIAGNOSIS — E039 Hypothyroidism, unspecified: Secondary | ICD-10-CM

## 2016-10-08 DIAGNOSIS — E871 Hypo-osmolality and hyponatremia: Secondary | ICD-10-CM | POA: Diagnosis not present

## 2016-10-08 DIAGNOSIS — F0150 Vascular dementia without behavioral disturbance: Secondary | ICD-10-CM | POA: Diagnosis present

## 2016-10-08 DIAGNOSIS — Z881 Allergy status to other antibiotic agents status: Secondary | ICD-10-CM

## 2016-10-08 DIAGNOSIS — Z823 Family history of stroke: Secondary | ICD-10-CM

## 2016-10-08 DIAGNOSIS — R4701 Aphasia: Secondary | ICD-10-CM | POA: Diagnosis present

## 2016-10-08 DIAGNOSIS — Z8673 Personal history of transient ischemic attack (TIA), and cerebral infarction without residual deficits: Secondary | ICD-10-CM | POA: Diagnosis not present

## 2016-10-08 DIAGNOSIS — Z888 Allergy status to other drugs, medicaments and biological substances status: Secondary | ICD-10-CM | POA: Diagnosis not present

## 2016-10-08 DIAGNOSIS — I471 Supraventricular tachycardia: Secondary | ICD-10-CM

## 2016-10-08 DIAGNOSIS — F329 Major depressive disorder, single episode, unspecified: Secondary | ICD-10-CM | POA: Diagnosis not present

## 2016-10-08 DIAGNOSIS — I10 Essential (primary) hypertension: Secondary | ICD-10-CM

## 2016-10-08 DIAGNOSIS — R4182 Altered mental status, unspecified: Secondary | ICD-10-CM

## 2016-10-08 DIAGNOSIS — Z8679 Personal history of other diseases of the circulatory system: Secondary | ICD-10-CM | POA: Diagnosis not present

## 2016-10-08 DIAGNOSIS — E034 Atrophy of thyroid (acquired): Secondary | ICD-10-CM | POA: Diagnosis present

## 2016-10-08 DIAGNOSIS — E785 Hyperlipidemia, unspecified: Secondary | ICD-10-CM | POA: Diagnosis not present

## 2016-10-08 DIAGNOSIS — I4581 Long QT syndrome: Secondary | ICD-10-CM | POA: Diagnosis not present

## 2016-10-08 DIAGNOSIS — R296 Repeated falls: Secondary | ICD-10-CM | POA: Diagnosis present

## 2016-10-08 DIAGNOSIS — Z880 Allergy status to penicillin: Secondary | ICD-10-CM

## 2016-10-08 DIAGNOSIS — Z7982 Long term (current) use of aspirin: Secondary | ICD-10-CM | POA: Diagnosis not present

## 2016-10-08 DIAGNOSIS — K219 Gastro-esophageal reflux disease without esophagitis: Secondary | ICD-10-CM | POA: Diagnosis present

## 2016-10-08 DIAGNOSIS — G9340 Encephalopathy, unspecified: Secondary | ICD-10-CM | POA: Diagnosis not present

## 2016-10-08 DIAGNOSIS — Z8249 Family history of ischemic heart disease and other diseases of the circulatory system: Secondary | ICD-10-CM

## 2016-10-08 DIAGNOSIS — Z833 Family history of diabetes mellitus: Secondary | ICD-10-CM

## 2016-10-08 DIAGNOSIS — G92 Toxic encephalopathy: Secondary | ICD-10-CM | POA: Diagnosis present

## 2016-10-08 DIAGNOSIS — R29810 Facial weakness: Secondary | ICD-10-CM | POA: Diagnosis present

## 2016-10-08 DIAGNOSIS — Z882 Allergy status to sulfonamides status: Secondary | ICD-10-CM

## 2016-10-08 DIAGNOSIS — Z79899 Other long term (current) drug therapy: Secondary | ICD-10-CM | POA: Diagnosis not present

## 2016-10-08 DIAGNOSIS — R69 Illness, unspecified: Secondary | ICD-10-CM | POA: Diagnosis not present

## 2016-10-08 LAB — TSH: TSH: 1.183 u[IU]/mL (ref 0.350–4.500)

## 2016-10-08 LAB — CK: Total CK: 81 U/L (ref 38–234)

## 2016-10-08 LAB — CBG MONITORING, ED: Glucose-Capillary: 113 mg/dL — ABNORMAL HIGH (ref 65–99)

## 2016-10-08 LAB — BASIC METABOLIC PANEL
ANION GAP: 10 (ref 5–15)
BUN: 5 mg/dL — ABNORMAL LOW (ref 6–20)
CHLORIDE: 98 mmol/L — AB (ref 101–111)
CO2: 26 mmol/L (ref 22–32)
CREATININE: 0.59 mg/dL (ref 0.44–1.00)
Calcium: 8.8 mg/dL — ABNORMAL LOW (ref 8.9–10.3)
GFR calc non Af Amer: >60 mL/min (ref >60)
Glucose, Bld: 115 mg/dL — ABNORMAL HIGH (ref 65–99)
Potassium: 3.4 mmol/L — ABNORMAL LOW (ref 3.5–5.1)
Sodium: 134 mmol/L — ABNORMAL LOW (ref 135–145)

## 2016-10-08 LAB — CBC
HCT: 37.6 % (ref 36.0–46.0)
HEMOGLOBIN: 12.2 g/dL (ref 12.0–15.0)
MCH: 28.4 pg (ref 26.0–34.0)
MCHC: 32.4 g/dL (ref 30.0–36.0)
MCV: 87.6 fL (ref 78.0–100.0)
Platelets: 254 10*3/uL (ref 150–400)
RBC: 4.29 MIL/uL (ref 3.87–5.11)
RDW: 14.4 % (ref 11.5–15.5)
WBC: 9.6 10*3/uL (ref 4.0–10.5)

## 2016-10-08 LAB — URINALYSIS, ROUTINE W REFLEX MICROSCOPIC
Bilirubin Urine: NEGATIVE
GLUCOSE, UA: NEGATIVE mg/dL
HGB URINE DIPSTICK: NEGATIVE
KETONES UR: 5 mg/dL — AB
Leukocytes, UA: NEGATIVE
Nitrite: NEGATIVE
Protein, ur: NEGATIVE mg/dL
Specific Gravity, Urine: 1.006 (ref 1.005–1.030)
pH: 7 (ref 5.0–8.0)

## 2016-10-08 LAB — LACTIC ACID, PLASMA: Lactic Acid, Venous: 0.9 mmol/L (ref 0.5–1.9)

## 2016-10-08 LAB — AMMONIA: Ammonia: 10 umol/L (ref 9–35)

## 2016-10-08 MED ORDER — ENOXAPARIN SODIUM 40 MG/0.4ML ~~LOC~~ SOLN
40.0000 mg | Freq: Every day | SUBCUTANEOUS | Status: DC
  Administered 2016-10-09: 40 mg via SUBCUTANEOUS
  Filled 2016-10-08 (×2): qty 0.4

## 2016-10-08 MED ORDER — SODIUM CHLORIDE 0.9 % IV SOLN
INTRAVENOUS | Status: DC
  Administered 2016-10-08 – 2016-10-09 (×2): via INTRAVENOUS

## 2016-10-08 MED ORDER — ASPIRIN 300 MG RE SUPP
300.0000 mg | Freq: Every day | RECTAL | Status: DC
  Administered 2016-10-08: 300 mg via RECTAL

## 2016-10-08 MED ORDER — ACETAMINOPHEN 650 MG RE SUPP
650.0000 mg | RECTAL | Status: DC | PRN
  Filled 2016-10-08: qty 1

## 2016-10-08 MED ORDER — ACETAMINOPHEN 325 MG PO TABS: 650.0000 mg | ORAL_TABLET | ORAL | Status: DC | PRN

## 2016-10-08 MED ORDER — ACETAMINOPHEN 160 MG/5ML PO SOLN: 650.0000 mg | ORAL | Status: DC | PRN

## 2016-10-08 MED ORDER — ASPIRIN 325 MG PO TABS
325.0000 mg | ORAL_TABLET | Freq: Every day | ORAL | Status: DC
  Filled 2016-10-08: qty 1

## 2016-10-08 MED ORDER — LABETALOL HCL 5 MG/ML IV SOLN
20.0000 mg | INTRAVENOUS | Status: DC | PRN
  Administered 2016-10-08 (×2): 20 mg via INTRAVENOUS
  Filled 2016-10-08 (×2): qty 4

## 2016-10-08 MED ORDER — LABETALOL HCL 5 MG/ML IV SOLN
10.0000 mg | INTRAVENOUS | Status: DC | PRN
  Administered 2016-10-08 (×5): 10 mg via INTRAVENOUS
  Filled 2016-10-08 (×6): qty 4

## 2016-10-08 MED ORDER — ALPRAZOLAM 0.25 MG PO TABS: 0.2500 mg | ORAL_TABLET | Freq: Every day | ORAL | 0 refills | Status: DC

## 2016-10-08 NOTE — Discharge Summary (Signed)
Name: Ebony [name] MRN: [id number] DOB: 1932/01/08 83 y.o. PCP: Florentina Jenny, MD  Date of Admission: 10/07/2016  9:14 PM Date of Discharge: 10/09/2016 Attending Physician: Doneen Poisson, MD  Discharge Diagnosis:  Principal Problem:   Acute encephalopathy Active Problems:   Essential hypertension   Hypothyroidism due to acquired atrophy of thyroid   Vascular dementia without behavioral disturbance   GERD (gastroesophageal reflux disease)   Expressive aphasia   Recurrent falls   Paroxysmal supraventricular tachycardia (HCC)   Hyponatremia   Hypertensive urgency  Discharge Medications: Allergies as of 10/09/2016      Reactions   Sulfa Antibiotics Diarrhea   Ace Inhibitors    Allergy not noted on MAR   Biaxin [clarithromycin] Diarrhea   Penicillins Diarrhea   Has patient had a PCN reaction causing immediate rash, facial/tongue/throat swelling, SOB or lightheadedness with hypotension: Unknown Has patient had a PCN reaction causing severe rash involving mucus membranes or skin necrosis: Unknown Has patient had a PCN reaction that required hospitalization: Unknown Has patient had a PCN reaction occurring within the last 10 years: Unknown If all of the above answers are "NO", then may proceed with Cephalosporin use.   Klor-con [potassium Chloride] Other (See Comments)   Noted to be an allergy on the [facility]   Cozaar [losartan Potassium] Rash      Medication List    STOP taking these medications   HYDROcodone-acetaminophen 5-325 MG tablet Commonly known as:  NORCO/VICODIN   ondansetron 4 MG tablet Commonly known as:  ZOFRAN     TAKE these medications   acetaminophen 500 MG tablet Commonly known as:  TYLENOL Take 500 mg by mouth 2 (two) times daily.   ALPRAZolam 0.25 MG tablet Commonly known as:  XANAX Take 1 tablet (0.25 mg total) by mouth every other day. You will do this for a total of 4 doses (8 days), then you will stop taking this medication completely. What  changed:  when to take this  additional instructions   ARTIFICIAL TEARS 0.1-0.3 % Soln Generic drug:  Dextran 70-Hypromellose Place 1 drop into both eyes every morning.   aspirin 81 MG chewable tablet Chew 81 mg by mouth daily.   BIOFREEZE 4 % Gel Generic drug:  Menthol (Topical Analgesic) Apply 1 application topically every 8 (eight) hours as needed (knee pain).   DULoxetine 20 MG capsule Commonly known as:  CYMBALTA Take 1 capsule (20 mg total) by mouth every other day. You will do this for 4 doses and then stop this medication completely. What changed:  when to take this  additional instructions   fluticasone 50 MCG/ACT nasal spray Commonly known as:  FLONASE Place 2 sprays into both nostrils daily.   ibuprofen 200 MG tablet Commonly known as:  ADVIL,MOTRIN Take 200 mg by mouth every 12 (twelve) hours as needed (for pain).   levothyroxine 25 MCG tablet Commonly known as:  SYNTHROID, LEVOTHROID Take 1 tablet (25 mcg total) by mouth daily before breakfast. What changed:  when to take this   lisinopril 40 MG tablet Commonly known as:  PRINIVIL,ZESTRIL Take 1 tablet (40 mg total) by mouth daily. What changed:  medication strength  how much to take   Melatonin 3 MG Caps By mouth qhs for sleep   metoprolol succinate 100 MG 24 hr tablet Commonly known as:  TOPROL-XL Take 150 mg by mouth every morning. for HTN.   MILK OF MAGNESIA 400 MG/5ML suspension Generic drug:  magnesium hydroxide Take 30 mLs by mouth daily  as needed for mild constipation.   pantoprazole 20 MG tablet Commonly known as:  PROTONIX Take 20 mg by mouth at bedtime.   Psyllium 500 MG Caps Take 500 mg by mouth at bedtime.   saccharomyces boulardii 250 MG capsule Commonly known as:  FLORASTOR Take 250 mg by mouth 2 (two) times daily.   senna 8.6 MG Tabs tablet Commonly known as:  SENOKOT Take 1 tablet by mouth See admin instructions. Mon. Wed. And Friday for constipation   THERA-M  PO Take 1 tablet by mouth every morning.       Disposition and follow-up:   Ms.Ajayla C Sudol was [facility] in Stable condition.  At the hospital follow up visit please address:  1.  Altered mental status 2/2 polypharmacy. The patient's initial presentation was concerning for stroke, but workup was negative. Further workup did not reveal any laboratory abnormalities that could cause altered mental status. Her AMS resolved with holding of home medications.  For reference a MOCA assessment was given once she returned to baseline. Her score was 9, indicating severe cognitive dysfunction at baseline.   Please review changes to the medication regimen. These changes include a 1-week taper of duloxetine and xanax followed by discontinuation of these medicines and an immediate discontinuation of ondansetron and Norco.     2.  Labs / imaging needed at time of follow-up: None  3.  Pending labs/ test needing follow-up: None  Follow-up Appointments: Contact information for after-discharge care    Destination    [facility] .   Specialty:  Assisted Living Facility Contact information: [address] [place] [id number] [phone]              Hospital Course by problem list: Principal Problem:   Acute encephalopathy Active Problems:   Essential hypertension   Hypothyroidism due to acquired atrophy of thyroid   Vascular dementia without behavioral disturbance   GERD (gastroesophageal reflux disease)   Expressive aphasia   Recurrent falls   Paroxysmal supraventricular tachycardia (HCC)   Hyponatremia   Hypertensive urgency   Ebony [name] is a 83 year old woman with a PMH of ischemic stroke, SAH, hospitalization for multiple falls, vascular dementia, and expressive aphasia who presented to the ED after the staff at her A:F thought she looked altered relative and noticed a facial droop. The patient was also  found down, suggesting that she may have fallen. She was admitted to the internal medicine teaching team and the following specific problems were addressed on admission:   Altered Mental Status: The patient was hypertensive on admission and the left facial droop was concerning for acute stroke. The patient's stroke workup in the ED, including CT head and MR brain, were negative for signs of ischemic or hemorrhagic stroke. Her workup, including urinalysis, CK, CBC, TSH, lactic acid, B12 and ammonia levels were all within normal limits. Her EEG did not reveal any changes from baseline indicative of seizures. Her presentation was consistent with AMS 2/2 polypharmacy, as the patient's MAR from her ALF showed some sedating medications, including duloxetine, Xanax, and Norco. For reference a MOCA assessment was given once she returned to baseline. Her score was 9, indicating severe cognitive dysfunction at baseline. The following changes were made.  1) We will taper and stop her Xanax completely. She will take one 0.25mg  tablet every other night for a total of 4 doses after leaving the hospital. She will then completely stop this medication.  2) We also decreased duloxetine to 20 mg during hospitalization. She will take 20 mg tablet every other night for a total of 4 doses after leaving the hospital. She will then completely stop this medication altogether.   3) Stop Norco prescription altogether, as patient has not needed this medication recently per [facility] review  HTN: There was initial concern for hypertensive urgency given the patient's >200 SBP on admission and altered mental status. Her MRI and CT Head were not concerning for acute stroke and no signs of PRES 2/2 increased BP on Brain MRI. There were no other signs of end organ damage 2/2 hypertension. We increased her lisinopril to 40 mg from home 20 mg as she remained hypertensive during her hospital stay. Please follow up.   Hx of paroxysmal SVT and  QT prolongation upon admission: The patient's QTc was 511 msec on admission. She was kept on telemetry and monitored while inpatient. We held ondansetron during hospitalization because this prolongs QT. Will discontinue this medication altogether on discharge. Please follow up regarding rate and rhythm control of potential arrhythmias on discharge.  Discharge Vitals:   BP (!) 174/92   Pulse 69   Temp 98.5 F (36.9 C) (Oral)   Resp 19   Ht 5' (1.524 m)   Wt 161 lb 2.5 oz (73.1 kg)   SpO2 95%   BMI 31.47 kg/m   Pertinent Labs, Studies, and Procedures:   BMP BMP Latest Ref Rng & Units 10/08/2016 10/07/2016 10/07/2016  Glucose 65 - 99 mg/dL 161(W) 960(A) 540(J)  BUN 6 - 20 mg/dL 5(L) 10 9  Creatinine 8.11 - 1.00 mg/dL 9.14 7.82 9.56  Sodium 135 - 145 mmol/L 134(L) 131(L) 131(L)  Potassium 3.5 - 5.1 mmol/L 3.4(L) 3.7 3.8  Chloride 101 - 111 mmol/L 98(L) 94(L) 94(L)  CO2 22 - 32 mmol/L 26 - 27  Calcium 8.9 - 10.3 mg/dL 2.1(H) - 9.1   CBC CBC Latest Ref Rng & Units 10/08/2016 10/07/2016 10/07/2016  WBC 4.0 - 10.5 K/uL 9.6 - 12.3(H)  Hemoglobin 12.0 - 15.0 g/dL 08.6 57.8 46.9  Hematocrit 36.0 - 46.0 % 37.6 40.0 39.5  Platelets 150 - 400 K/uL 254 - 269   Urinalysis    Component Value Date/Time   COLORURINE STRAW (A) 10/08/2016 0127   APPEARANCEUR CLEAR 10/08/2016 0127   LABSPEC 1.006 10/08/2016 0127   PHURINE 7.0 10/08/2016 0127   GLUCOSEU NEGATIVE 10/08/2016 0127   HGBUR NEGATIVE 10/08/2016 0127   BILIRUBINUR NEGATIVE 10/08/2016 0127   KETONESUR 5 (A) 10/08/2016 0127   PROTEINUR NEGATIVE 10/08/2016 0127   UROBILINOGEN 1.0 04/12/2012 1451   NITRITE NEGATIVE 10/08/2016 0127   LEUKOCYTESUR NEGATIVE 10/08/2016 0127   Ammonia = 10 TSH = 1.183 Lactic Acid = 0.9 CK = 81 B12 = 258  Impression of EEG on 10/09/16 This awake and drowsy EEG is abnormal due to the presence of: 1. Moderate diffuse slowing of the waking background with triphasic-like waves seen 2. Additional occasional focal  slowing over the left frontotemporal region  Clinical Correlation of the above findings indicates diffuse cerebral dysfunction that is non-specific in etiology and can be seen with hypoxic/ischemic injury, toxic/metabolic encephalopathies, neurodegenerative disorders, or medication effect. Triphasic-like waves are typically seen with hepatic encephalopathy, but can be seen with other metabolic encephalopathies as well. Focal slowing over the left frontotemporal region indicates focal cerebral dysfunction in this region suggestive of underlying structural or physiologic abnormality.  The absence of epileptiform discharges does not rule out a clinical diagnosis of  epilepsy.  Clinical correlation is advised.  Discharge Instructions: Discharge Instructions    Activity as tolerated - No restrictions    Complete by:  As directed    Call MD for:  difficulty breathing, headache or visual disturbances    Complete by:  As directed    Call MD for:  difficulty breathing, headache or visual disturbances    Complete by:  As directed    Call MD for:  difficulty breathing, headache or visual disturbances    Complete by:  As directed    Call MD for:  extreme fatigue    Complete by:  As directed    Call MD for:  persistant dizziness or light-headedness    Complete by:  As directed    Call MD for:  persistant dizziness or light-headedness    Complete by:  As directed    Call MD for:  redness, tenderness, or signs of infection (pain, swelling, redness, odor or green/yellow discharge around incision site)    Complete by:  As directed    Call MD for:  temperature >100.4    Complete by:  As directed    Call MD for:  temperature >100.4    Complete by:  As directed    Call MD for:  temperature >100.4    Complete by:  As directed    Diet - low sodium heart healthy    Complete by:  As directed    Diet general    Complete by:  As directed    Diet general    Complete by:  As directed    Discharge instructions     Complete by:  As directed    Please follow up with your PCP within 1 week regarding changes to your medications and BP control outside of the hospital on your normal home medications.   Increase activity slowly    Complete by:  As directed    Increase activity slowly    Complete by:  As directed    Increase activity slowly    Complete by:  As directed       Signed: Rozann Lesches, MD 10/09/2016, 12:12 PM   Pager: [phone]

## 2016-10-08 NOTE — ED Notes (Signed)
Dinner tray ordered; mechanical soft, heart healthy

## 2016-10-08 NOTE — ED Notes (Signed)
Pt continues to just look  She does not attempt to follow instructions  No verbal response

## 2016-10-08 NOTE — Clinical Social Work Placement (Signed)
   CLINICAL SOCIAL WORK PLACEMENT  NOTE  Date:  10/08/2016  Patient Details  Name: Joslyn Devonancy C Trier MRN: [id number] Date of Birth: [birth year]  Clinical Social Work is seeking post-discharge placement for this patient at the Skilled  Nursing Facility level of care (*CSW will initial, date and re-position this form in  chart as items are completed):  Yes   Patient/family provided with [facility] Department's list of facilities offering this level of care within the geographic area requested by the patient (or if unable, by the patient's family).  Yes   Patient/family informed of their freedom to choose among providers that offer the needed level of care, that participate in Medicare, Medicaid or managed care program needed by the patient, have an available bed and are willing to accept the patient.  Yes   Patient/family informed of [facility]'s ownership interest in [facility] and [facility], as well as of the fact that they are under no obligation to receive care at these facilities.  PASRR submitted to EDS on       PASRR number received on       Existing PASRR number confirmed on 10/08/16     FL2 transmitted to all facilities in geographic area requested by pt/family on 10/08/16     FL2 transmitted to all facilities within larger geographic area on       Patient informed that his/her managed care company has contracts with or will negotiate with certain facilities, including the following:            Patient/family informed of bed offers received.  Patient chooses bed at       Physician recommends and patient chooses bed at      Patient to be transferred to   on  .  Patient to be transferred to facility by       Patient family notified on   of transfer.  Name of family member notified:        PHYSICIAN Please sign DNR, Please sign FL2     Additional Comment:    _______________________________________________ Raye Sorrowoble, Indica Marcott N,  LCSW 10/08/2016, 8:37 PM

## 2016-10-08 NOTE — Evaluation (Signed)
Clinical/Bedside Swallow Evaluation Patient Details  Name: Ebony [name] MRN: [id number] Date of Birth: [birth year]  Today's Date: 10/08/2016 Time: SLP Start Time (ACUTE ONLY): 1550 SLP Stop Time (ACUTE ONLY): 1620 SLP Time Calculation (min) (ACUTE ONLY): 30 min  Past Medical History:  Past Medical History:  Diagnosis Date  . Acute ischemic stroke (HCC) 04/11/2012  . Allergic rhinitis   . Anxiety   . Arthritis    Knees  . Depression   . Fibrocystic breast disease   . Hiatal hernia   . Hyperlipidemia   . Hypertension   . IFG (impaired fasting glucose)   . SDH (subdural hematoma) (HCC) 05/16/2011  . Subarachnoid hemorrhage following injury (HCC) 05/16/2011  . Subarachnoid hemorrhage following injury (HCC) 05/16/2011  . Unspecified hypothyroidism 04/15/2012   Past Surgical History:  Past Surgical History:  Procedure Laterality Date  . ABDOMINAL HYSTERECTOMY    . BREAST SURGERY    . EYE SURGERY    . TONSILLECTOMY     HPI:  Ptis an 83 year old woman with a PMH of ischemic stroke, SAH, hospitalization for multiple falls, vascular dementia, and expressive aphasia who presented to the ED after the staff at her SNF thought she looked altered relative to her baseline and noticed a facial droop. MRI was negative for acute infarct. She had a speech/language evaluation in April 2018 wtih signs of an expressive aphasia and concern for PPA.   Assessment / Plan / Recommendation Clinical Impression  Pt has mild lingual residue in need of liquid wash and cough x1 with dry cracker, suspect related to premature spillage. Recommend Dys 3 diet and thin liquids. She was ona  Praxairregualr diet and thin liquids PTA. SLP f/u indicated for safe advancement. SLP Visit Diagnosis: Dysphagia, oropharyngeal phase (R13.12)    Aspiration Risk  Mild aspiration risk    Diet Recommendation Dysphagia 3 (Mech soft);Thin liquid   Liquid Administration via: Cup;Straw Medication Administration: Whole meds with  puree Supervision: Patient able to self feed;Full supervision/cueing for compensatory strategies Compensations: Small sips/bites;Minimize environmental distractions;Slow rate;Follow solids with liquid Postural Changes: Seated upright at 90 degrees;Remain upright for at least 30 minutes after po intake    Other  Recommendations Oral Care Recommendations: Oral care BID   Follow up Recommendations Skilled Nursing facility      Frequency and Duration min 2x/week  2 weeks       Prognosis Prognosis for Safe Diet Advancement: Good Barriers to Reach Goals: Cognitive deficits;Language deficits      Swallow Study   General HPI: Ptis an 83 year old woman with a PMH of ischemic stroke, SAH, hospitalization for multiple falls, vascular dementia, and expressive aphasia who presented to the ED after the staff at her SNF thought she looked altered relative to her baseline and noticed a facial droop. MRI was negative for acute infarct. She had a speech/language evaluation in April 2018 wtih signs of an expressive aphasia and concern for PPA. Type of Study: Bedside Swallow Evaluation Previous Swallow Assessment: BSE in April with normal function Diet Prior to this Study: NPO Temperature Spikes Noted: No Respiratory Status: Room air History of Recent Intubation: No Behavior/Cognition: Alert;Cooperative;Requires cueing Oral Cavity Assessment: Within Functional Limits Oral Care Completed by SLP: No Oral Cavity - Dentition: Adequate natural dentition Vision: Functional for self-feeding Self-Feeding Abilities: Able to feed self Patient Positioning: Upright in bed Baseline Vocal Quality: Normal Volitional Cough: Cognitively unable to elicit Volitional Swallow: Unable to elicit    Oral/Motor/Sensory Function Overall Oral Motor/Sensory Function:  Mild impairment Facial ROM: Reduced left Facial Symmetry: Abnormal symmetry left Facial Strength: Reduced left   Ice Chips Ice chips: Not tested   Thin  Liquid Thin Liquid: Within functional limits Presentation: Cup;Self Fed;Straw    Nectar Thick Nectar Thick Liquid: Not tested   Honey Thick Honey Thick Liquid: Not tested   Puree Puree: Within functional limits Presentation: Self Fed;Spoon   Solid   GO   Solid: Impaired Presentation: Self Fed Oral Phase Functional Implications: Other (comment) (lingual residue)        Ebony [name] 10/08/2016,4:31 PM  Ebony [name], M.A. CCC-SLP [phone]

## 2016-10-08 NOTE — Progress Notes (Signed)
Subjective: Patient is non vocal as compared to yesterday's exam to which apparently she was talking and will not talk but will follow some commands with aid.   Exam: Vitals:   10/08/16 1030 10/08/16 1049  BP: (!) 160/83 (!) 160/83  Pulse: 76 76  Resp: (!) 21 16  Temp:    SpO2: 93% 95%    HEENT-  Normocephalic, no lesions, without obvious abnormality.  Normal external eye and conjunctiva.  Normal TM's bilaterally.  Normal auditory canals and external ears. Normal external nose, mucus membranes and septum.  Normal pharynx.    Neuro:  CN: Pupils are equal and round. They are symmetrically reactive from 3-->2 mm. EOMI without nystagmus. Winces to facial PP bilaterally. Face is symmetric at rest with normal strength and mobility. Hearing-opens eyes to voice. Would not open mouth.  Motor: Normal bulk, tone, and strength. 5/5 throughout. No drift. Able to hold both arms up with no drift and both legs antigravity with no drift Sensation:  To pain bilaterally DTRs:  1+ throughout with no ankle jerks downgoing toes Coordination: Would not take part in exam    Pertinent Labs/Diagnostics: MRI shows no acute intracranial process however it is highly motion are graded. There is an old left frontal lobe infarct  EEG pending Sodium is 134, potassium 3.4, chloride 98, calcium 8.8 UA is negative for nitrites or leukocytes  Impression: 83 year old female brought in as code stroke who is now nonfocal on exam and at this time nonvocal as compared to yesterday. Patient is able to follow verbal commands minimally if desires. She does say "ouch"when pain is administered. There is question of possible toxic metabolic encephalopathy versus behavioral component.   Recommendations: 1) awaiting EEG 2) TSH, ammonia  Felicie MornDavid Smith PA-C [facility] [phone] 10/08/2016, 11:35 AM   I have seen the patient and reviewed the above note. She does speak some to me, indicating that it is her  daughter who is present, does not answer me when I ask her other orientation questions. Daughter indicates that frequently with hospitalization, she becomes confused. I suspect that this is a multifactorial delirium including polypharmacy, hospitalization, underlying dementia. We can rule out other causes as indicated above.  Ritta SlotMcNeill Ravenna Legore, MD [facility] [phone]  If 7pm- 7am, please page neurology on call as listed in AMION.\

## 2016-10-08 NOTE — ED Notes (Signed)
Patient is resting on stretcher when ask a question patient will nod her head approp. No verbal response.

## 2016-10-08 NOTE — Care Management Note (Signed)
Case Management Note  Patient Details  Name: Ebony [name] MRN: [id number] Date of Birth: 10-13-1931  Subjective/Objective:                   83 year old [facility]; PMHx advanced dementia, occasional expressive dysphagia, frequent falls, old subarachnoid hemorrhage, hypertension. She is presenting today with increased altered mental status. Patient found slumped against the door of her room by staff. She is having trouble communicating more than usual. Sent here for further evaluation.  Action/Plan: Admit status INPATIENT (CODE STROKE); anticipate discharge SNF VS ALF WITH HH.   Expected Discharge Date:   (unknown)               Expected Discharge Plan:  Skilled Nursing Facility  In-House Referral:  Clinical Social Work  Discharge planning Services  CM Consult  Post Acute Care Choice:    Choice offered to:     DME Arranged:    DME Agency:     HH Arranged:    HH Agency:     Status of Service:  In process, will continue to follow  If discussed at Long Length of Stay Meetings, dates discussed:    Additional Comments:  Oletta CohnWood, Brown Dunlap, RN 10/08/2016, 11:02 AM

## 2016-10-08 NOTE — Evaluation (Signed)
Physical Therapy Evaluation Patient Details Name: Ebony [name] MRN: [id number] DOB: [birth year] Today's Date: 10/08/2016   History of Present Illness  83 year old woman with a PMH of ischemic stroke, SAH, hospitalization for multiple falls, vascular dementia, and expressive aphasia who presented to the ED after the staff at her SNF thought she looked altered relative to her baseline and noticed a facial droop. The patient was also found down, suggesting that she may have fallen prior to this change in altered mental   Clinical Impression  Orders received for PT evaluation. Patient demonstrates deficits in functional mobility as indicated below. Will benefit from continued skilled PT to address deficits and maximize function. Will see as indicated and progress as tolerated. Recommend ST SNF upon acute discharge for further rehabilitation and improvements in functional mobility.    OF NOTE: Patient with Afib during session HR elevation to 120s and elevated BP 214/86 hr 78 at rest after activity  Left facial droop, poor positional awareness, and expressive difficulties appreciated during session    Follow Up Recommendations SNF;Supervision/Assistance - 24 hour    Equipment Recommendations  Other (comment) (tbd)    Recommendations for Other Services       Precautions / Restrictions Precautions Precautions: Fall      Mobility  Bed Mobility Overal bed mobility: Needs Assistance Bed Mobility: Supine to Sit;Sit to Supine     Supine to sit: Max assist;+2 for physical assistance Sit to supine: +2 for physical assistance;Mod assist   General bed mobility comments: increased time and effort, required assist for initiation of movement and task. Once initiated, less physical assist required but multi modal cues throughout performance. Patient with one incident of self initiation to lay back down and showed ability to take trunk to sidelying and elevate LEs upward but task halted by  therapist during performance as the desire was to further mobilize.   Transfers Overall transfer level: Needs assistance Equipment used: 2 person hand held assist Transfers: Sit to/from Stand Sit to Stand: Mod assist;+2 physical assistance         General transfer comment: +2 moderate assist to power up to stand and provide stability in standing. Manual initiation of activity required. patient noted with posterior and left lateral bias. Worsened with fatigue  Ambulation/Gait Ambulation/Gait assistance: +2 physical assistance;Max assist Ambulation Distance (Feet): 12 Feet Assistive device: 2 person hand held assist Gait Pattern/deviations: Step-to pattern;Decreased stride length;Narrow base of support;Scissoring;Decreased weight shift to right Gait velocity: decreased Gait velocity interpretation: <1.8 ft/sec, indicative of risk for recurrent falls General Gait Details: Patient iwth significant left lateral trunk lean during mobility and poor ability to initiate step/stride. Manual facilitation of weight shift required for initiation and increased lateral support as well as pacing. +2 physical assist at all times, worsened with fatigue  Stairs            Wheelchair Mobility    Modified Rankin (Stroke Patients Only)       Balance Overall balance assessment: Needs assistance;History of Falls   Sitting balance-Leahy Scale: Poor Sitting balance - Comments: min guard to min assist, posterior bias in sitting initially improved with time at EOB Postural control: Posterior lean Standing balance support: During functional activity Standing balance-Leahy Scale: Zero Standing balance comment: patient with significant left lateral lean, would fall over without significant physical assist                             Pertinent  Vitals/Pain Pain Assessment: Faces Faces Pain Scale: No hurt    Home Living Family/patient expects to be discharged to:: Assisted  living Living Arrangements: Alone (daughters plan on staying with her at first)   Type of Home: Assisted living Home Access: Ramped entrance     Home Layout: One level Home Equipment: Walker - 2 wheels;Walker - 4 wheels;Walker - standard;Bedside commode;Shower seat;Grab bars - toilet Additional Comments: Pt lived at [facility]     Prior Function Level of Independence: Independent with assistive device(s)         Comments: h/o recent falls      Hand Dominance   Dominant Hand: Right    Extremity/Trunk Assessment        Lower Extremity Assessment Lower Extremity Assessment: Generalized weakness;Difficult to assess due to impaired cognition       Communication   Communication: Expressive difficulties  Cognition Arousal/Alertness: Awake/alert Behavior During Therapy: Flat affect Overall Cognitive Status: Impaired/Different from baseline Area of Impairment: Orientation;Attention;Following commands;Safety/judgement;Awareness;Problem solving                 Orientation Level: Disoriented to;Place;Time;Situation Current Attention Level: Focused   Following Commands: Follows one step commands inconsistently;Follows one step commands with increased time     Problem Solving: Slow processing;Decreased initiation;Difficulty sequencing;Requires verbal cues;Requires tactile cues        General Comments      Exercises     Assessment/Plan    PT Assessment Patient needs continued PT services  PT Problem List Decreased strength;Decreased range of motion;Decreased activity tolerance;Decreased balance;Decreased mobility;Decreased coordination;Decreased cognition;Decreased safety awareness       PT Treatment Interventions DME instruction;Gait training;Functional mobility training;Therapeutic activities;Therapeutic exercise;Balance training;Neuromuscular re-education;Cognitive remediation;Patient/family education    PT Goals (Current goals can be found in the  Care Plan section)  Acute Rehab PT Goals Patient Stated Goal: to improve mobility PT Goal Formulation: With family Time For Goal Achievement: 10/22/16 Potential to Achieve Goals: Good    Frequency Min 3X/week   Barriers to discharge Decreased caregiver support      Co-evaluation PT/OT/SLP Co-Evaluation/Treatment: Yes Reason for Co-Treatment: Complexity of the patient's impairments (multi-system involvement);Necessary to address cognition/behavior during functional activity;For patient/therapist safety;To address functional/ADL transfers PT goals addressed during session: Mobility/safety with mobility;Balance OT goals addressed during session: ADL's and self-care SLP goals addressed during session: Swallowing;Cognition;Communication     AM-PAC PT "6 Clicks" Daily Activity  Outcome Measure Difficulty turning over in bed (including adjusting bedclothes, sheets and blankets)?: Total Difficulty moving from lying on back to sitting on the side of the bed? : Total Difficulty sitting down on and standing up from a chair with arms (e.g., wheelchair, bedside commode, etc,.)?: Total Help needed moving to and from a bed to chair (including a wheelchair)?: A Lot Help needed walking in hospital room?: A Lot Help needed climbing 3-5 steps with a railing? : A Lot 6 Click Score: 9    End of Session   Activity Tolerance: Patient tolerated treatment well;Patient limited by fatigue Patient left: in bed;with call bell/phone within reach;with family/visitor present Nurse Communication: Mobility status;Other (comment) (elevated BP and Afib) PT Visit Diagnosis: Unsteadiness on feet (R26.81);History of falling (Z91.81);Difficulty in walking, not elsewhere classified (R26.2);Other symptoms and signs involving the nervous system (R29.898)    Time: 1610-9604 PT Time Calculation (min) (ACUTE ONLY): 25 min   Charges:   PT Evaluation $PT Eval Moderate Complexity: 1 Mod     PT G Codes:        [employer]  Norval MortonWerner, PT DPT  Board Certified Neurologic Specialist [phone]   Fabio AsaDevon J Yostin Malacara 10/08/2016, 5:29 PM

## 2016-10-08 NOTE — ED Notes (Signed)
The [facility]

## 2016-10-08 NOTE — ED Notes (Signed)
The pt will say oh for pain otherwise she makes eye contact but will not answer questions and will not follow commands.  When we were changing her bed she voluntarily turned onto her rt side

## 2016-10-08 NOTE — Clinical Social Work Note (Signed)
Clinical Social Work Assessment  Patient Details  Name: Ebony [name] MRN: [id number] Date of Birth: [birth year]  Date of referral:  10/08/16               Reason for consult:  Facility Placement                Permission sought to share information with:  Family Supports Permission granted to share information::     Name::     Theador HawthorneSusuan Coggins (daughter.)  Agency::     Relationship::     Contact Information:     Housing/Transportation Living arrangements for the past 2 months:  Assisted Living Facility ([facility]) Source of Information:  Adult Children Chartered certified accountant(duaghter Susan. ) Patient Interpreter Needed:  None Criminal Activity/Legal Involvement Pertinent to Current Situation/Hospitalization:  No - Comment as needed Significant Relationships:  Adult Children Lives with:  Facility Resident Do you feel safe going back to the place where you live?  Yes (Family is looking for SNF for placement due to increased number of falls. ) Need for family participation in patient care:  Yes (Comment)  Care giving concerns:  Pt was unable to speak with pt and was informed to contact daughter Darl Pikes(Susan) instead for information. No current concerns are present at this time per daughter report.    Social Worker assessment / plan: CSW spoke with Darl PikesSusan via phone to gather information on pt. CSW was informed by daughter that [facility] however due to pt's increased amount of falls, daughter is seeking SNF for placement if available, if not pts daughter is agreeable to have pt go back [facility] until new placement has been found. CSW was also informed that [facility] but [facility] due to needs. Daughter informed CSW that she and sister Waynetta SandyBeth are pt's primary supports when pt needs something.  Employment status:  Retired Database administratornsurance information:  Managed Medicare PT Recommendations:  Not assessed at this time Information / Referral to community  resources:  Skilled Nursing Facility  Patient/Family's Response to care:  Daughter is agreeable and understanding of pt needs at this time. Daughter is also for SNF placement options for pt due to pt's falling.   Patient/Family's Understanding of and Emotional Response to Diagnosis, Current Treatment, and Prognosis:  Pt's daughter was understanding and agreeable to plan of care at this time for pt. NO further comments or concerns at this time.  Emotional Assessment Appearance:    Attitude/Demeanor/Rapport:  Unable to Assess Affect (typically observed):  Unable to Assess Orientation:    Alcohol / Substance use:  Not Applicable Psych involvement (Current and /or in the community):  No (Comment)  Discharge Needs  Concerns to be addressed:  Denies Needs/Concerns at this time Readmission within the last 30 days:  No Current discharge risk:  None Barriers to Discharge:  No Barriers Identified   Robb MatarKierra S Camilo Mander, LCSWA 10/08/2016, 11:30 AM

## 2016-10-08 NOTE — Progress Notes (Signed)
Subjective: The patient is smiling, laying in bed in no acute distress. She nods when I ask her questions regarding orientation, including where she is and what year it is. She does not speak. She has limited participation with exam.   Objective:  Vital signs in last 24 hours: Vitals:   10/08/16 1130 10/08/16 1200 10/08/16 1230 10/08/16 1300  BP: (!) 187/93 (!) 203/92 (!) 161/68 (!) 175/83  Pulse: 68 72 69 71  Resp: 20 20 20  (!) 23  Temp:      TempSrc:      SpO2: 94% 95% 91% 96%  Weight:      Height:       Physical Exam  Constitutional:  Patient laying comfortably in bed in no acute distress.   Cardiovascular: Normal rate, regular rhythm, normal heart sounds and intact distal pulses.   No murmur heard. Pulmonary/Chest: Breath sounds normal. No respiratory distress. She has no wheezes.  Abdominal: Soft. She exhibits no distension. There is no tenderness.  Musculoskeletal: She exhibits no edema (of bilateral lower extremity) or tenderness (of bilateral lower extremities).  Neurological:  Patient nods but does not speak during exam. Difficult to assess gross motor strength 2/2 patient participation. Pupils equally round. Face and smile symmetric.   Skin: Skin is warm. No rash noted. She is diaphoretic (on forehead). No erythema. No pallor.   Assessment/Plan:  Principal Problem:   Acute encephalopathy Active Problems:   Essential hypertension   Hypothyroidism due to acquired atrophy of thyroid   Vascular dementia without behavioral disturbance   GERD (gastroesophageal reflux disease)   Expressive aphasia   Recurrent falls   Paroxysmal supraventricular tachycardia (HCC)   Hyponatremia   Hypertensive urgency  Ebony [name] is a 83 year old woman with a PMH of ischemic stroke, SAH, hospitalization for multiple falls, vascular dementia, and expressive aphasia who presented to the ED after the staff at her SNF thought she looked altered relative to her baseline and noticed a  facial droop. The patient was also found down, suggesting that she may have fallen prior to this change in altered mental status. She was admitted to the internal medicine teaching team and the following specific problems were addressed on admission:   Altered Mental Status: The patient was hypertensive on admission and the left facial droop was concerning for acute stroke. The patient's stroke workup in the ED, including CT head and MR brain, were negative for signs of ischemic or hemorrhagic stroke. Since the patient may have fallen, she had a CT head and c-spine which was negative for acute fracture. Her workup, including urinalysis, CK,  CBC, TSH, lactic acid and ammonia levels were all within normal limits. A neurology consult suggested EEG, however the patient's presentation was not consistent with seizure, so this avenue was not further discussed.The patient's BMP showed slight hyponatremia, which is unchanged from her baseline which suggests no acute change that could explain altered mental status. Right now her presentation is most consistent with polypharmacy, as the patient's MAR from her SNF show some sedating medications, including Norco and Xanax.  -Wean home xanax dose to once per day, with the goal of stopping completely after 7 days. -Discontinue Norco completely on discharge -Consider stopping anti-depressant as this may contribute to confusion as well.   HTN: There was initial concern for hypertensive urgency given the patient's >200 SBP on admission and altered mental status. Her MRI and CT Head were not concerning for acute stroke and there were no other signs of  end organ damage 2/2 hypertension. The patient intermittently has elevated BP, which has been managed in the ED with PRN labetalol. Per MAR reports from SNF, the patient's BP has been <140/90 on lisinopril 20 mg daily at home. Plan to discharge the patient with this medication.   Hx of paroxysmal SVT: Patient was kept on  telemetry and monitored while in ED. Given labetalol for rate control in addition to BP management as stated above.   Hx of hypothyroidism: TSH = 1.183 on arrival. Continue home synthroid, 25 mcg daily.  VTE prophylaxis: Lovenox 40 mg  Diet: NPO in ED pending ED swallow evaluation  Dispo: Anticipated discharge in approximately 1 day.   Rozann Lesches, MD 10/08/2016, 1:49 PM Pager: [phone]

## 2016-10-08 NOTE — Evaluation (Signed)
Speech Language Pathology Evaluation Patient Details Name: Ebony [name] MRN: [id number] DOB: 07-Apr-1931 Today's Date: 10/08/2016 Time: [id number] SLP Time Calculation (min) (ACUTE ONLY): 30 min  Problem List:  Patient Active Problem List   Diagnosis Date Noted  . Hyponatremia 10/08/2016  . Hypertensive urgency 10/08/2016  . Paroxysmal supraventricular tachycardia (HCC)   . Recurrent falls 09/04/2016  . Maxillary sinus fracture (HCC) 09/04/2016  . Closed fracture of nasal bone 09/04/2016  . Acute encephalopathy   . Expressive aphasia   . History of TIA (transient ischemic attack) and stroke 06/24/2016  . Depression with anxiety 06/24/2016  . Hypothyroid 06/24/2016  . GERD (gastroesophageal reflux disease) 06/24/2016  . Allergic rhinitis 06/08/2014  . DDD (degenerative disc disease), lumbar 06/08/2014  . Insomnia 12/07/2013  . Depression   . OA (osteoarthritis) 01/06/2013  . Loss of weight 07/13/2012  . Vascular dementia without behavioral disturbance 06/20/2012  . Hypothyroidism due to acquired atrophy of thyroid 04/15/2012  . Essential hypertension 05/16/2011  . Anxiety disorder 05/16/2011   Past Medical History:  Past Medical History:  Diagnosis Date  . Acute ischemic stroke (HCC) 04/11/2012  . Allergic rhinitis   . Anxiety   . Arthritis    Knees  . Depression   . Fibrocystic breast disease   . Hiatal hernia   . Hyperlipidemia   . Hypertension   . IFG (impaired fasting glucose)   . SDH (subdural hematoma) (HCC) 05/16/2011  . Subarachnoid hemorrhage following injury (HCC) 05/16/2011  . Subarachnoid hemorrhage following injury (HCC) 05/16/2011  . Unspecified hypothyroidism 04/15/2012   Past Surgical History:  Past Surgical History:  Procedure Laterality Date  . ABDOMINAL HYSTERECTOMY    . BREAST SURGERY    . EYE SURGERY    . TONSILLECTOMY     HPI:  Ptis an 83 year old woman with a PMH of ischemic stroke, SAH, hospitalization for multiple falls, vascular  dementia, and expressive aphasia who presented to the ED after the staff at her SNF thought she looked altered relative to her baseline and noticed a facial droop. MRI was negative for acute infarct. She had a speech/language evaluation in April 2018 wtih signs of an expressive aphasia and concern for PPA.   Assessment / Plan / Recommendation Clinical Impression  Pt needs cues and extra time for initiation of tasks and verbal output. At baseline she has an expressive aphasia but is very communicative per her daughters, and today she offers minimal spontaneous output. She is very easily distracted by her environment. She will need SLP f/u to maximize functional communication and cognition.    SLP Assessment  SLP Recommendation/Assessment: Patient needs continued Speech Lanaguage Pathology Services SLP Visit Diagnosis: Cognitive communication deficit (R41.841)    Follow Up Recommendations  Skilled Nursing facility    Frequency and Duration min 2x/week  2 weeks      SLP Evaluation Cognition  Overall Cognitive Status: Impaired/Different from baseline Arousal/Alertness: Awake/alert Orientation Level: Oriented to person;Disoriented to place Attention: Sustained Sustained Attention: Impaired Sustained Attention Impairment: Functional basic Awareness: Impaired Awareness Impairment: Emergent impairment Safety/Judgment: Impaired Comments: slow initiation       Comprehension  Auditory Comprehension Overall Auditory Comprehension: Impaired Commands: Impaired One Step Basic Commands: 75-100% accurate    Expression Expression Primary Mode of Expression: Verbal Verbal Expression Overall Verbal Expression: Impaired Initiation: Impaired Automatic Speech: Name Level of Generative/Spontaneous Verbalization: Word Non-Verbal Means of Communication: Not applicable Written Expression Dominant Hand: Right   Oral / Motor  Oral Motor/Sensory Function Overall Oral Motor/Sensory  Function: Mild  impairment Facial ROM: Reduced left Facial Symmetry: Abnormal symmetry left Facial Strength: Reduced left Motor Speech Overall Motor Speech:  (very limited assessment)   GO                    [name] 10/08/2016, 4:35 PM  [name], M.A. CCC-SLP [phone]

## 2016-10-08 NOTE — ED Notes (Signed)
The pt went to mri and just returned 

## 2016-10-08 NOTE — ED Notes (Signed)
Pt incontinent of urine for the second time  Bed and gown changed.  Pur-0wick pl;aced for the urine incontinence

## 2016-10-08 NOTE — Evaluation (Signed)
Occupational Therapy Evaluation Patient Details Name: Ebony [name] MRN: [id number] DOB: 06-10-1931 Today's Date: 10/08/2016    History of Present Illness 83 year old woman with a PMH of ischemic stroke, SAH, hospitalization for multiple falls, vascular dementia, and expressive aphasia who presented to the ED after the staff at her SNF thought she looked altered relative to her baseline and noticed a facial droop. The patient was also found down, suggesting that she may have fallen prior to this change in altered mental    Clinical Impression   Pt admitted with above. She demonstrates the below listed deficits and will benefit from continued OT to maximize safety and independence with BADLs.  Pt presents to OT with impaired cognition, generalized weakness, impaired balance, decreased activity tolerance.   She requires mod - total A for ADLs and mod A +2 with functional mobility.   Pt with heavy Lt lean with ambulation.   Pt was residing at ALF, and will now require SNF.  Will follow.       Follow Up Recommendations  SNF    Equipment Recommendations  None recommended by OT    Recommendations for Other Services       Precautions / Restrictions Precautions Precautions: Fall      Mobility Bed Mobility Overal bed mobility: Needs Assistance Bed Mobility: Supine to Sit;Sit to Supine     Supine to sit: Max assist;+2 for physical assistance Sit to supine: +2 for physical assistance;Mod assist   General bed mobility comments: increased time and effort, required assist for initiation of movement and task. Once initiated, less physical assist required but multi modal cues throughout performance. Patient with one incident of self initiation to lay back down and showed ability to take trunk to sidelying and elevate LEs upward but task halted by therapist during performance as the desire was to further mobilize.   Transfers Overall transfer level: Needs assistance Equipment used: 2  person hand held assist Transfers: Sit to/from Stand Sit to Stand: Mod assist;+2 physical assistance         General transfer comment: +2 moderate assist to power up to stand and provide stability in standing. Manual initiation of activity required. patient noted with posterior and left lateral bias. Worsened with fatigue    Balance Overall balance assessment: Needs assistance;History of Falls   Sitting balance-Leahy Scale: Poor Sitting balance - Comments: min guard to min assist, posterior bias in sitting initially improved with time at EOB Postural control: Posterior lean Standing balance support: During functional activity Standing balance-Leahy Scale: Zero Standing balance comment: patient with significant left lateral lean, would fall over without significant physical assist                           ADL either performed or assessed with clinical judgement   ADL Overall ADL's : Needs assistance/impaired Eating/Feeding: Sitting;Supervision/ safety;Set up   Grooming: Wash/dry hands;Wash/dry face;Oral care;Brushing hair;Minimal assistance;Sitting   Upper Body Bathing: Maximal assistance;Sitting   Lower Body Bathing: Maximal assistance;Sit to/from stand   Upper Body Dressing : Maximal assistance;Sitting   Lower Body Dressing: Total assistance;Sit to/from stand   Toilet Transfer: Moderate assistance;+2 for physical assistance;Ambulation;Comfort height toilet;BSC   Toileting- Clothing Manipulation and Hygiene: Total assistance;Sit to/from stand       Functional mobility during ADLs: Moderate assistance;+2 for physical assistance       Vision         Perception     Praxis  Pertinent Vitals/Pain Pain Assessment: Faces Faces Pain Scale: No hurt     Hand Dominance Right   Extremity/Trunk Assessment Upper Extremity Assessment Upper Extremity Assessment: Generalized weakness   Lower Extremity Assessment Lower Extremity Assessment: Defer to PT  evaluation   Cervical / Trunk Assessment Cervical / Trunk Assessment: Normal   Communication Communication Communication: Expressive difficulties   Cognition Arousal/Alertness: Awake/alert Behavior During Therapy: Flat affect Overall Cognitive Status: History of cognitive impairments - at baseline Area of Impairment: Orientation;Attention;Following commands;Safety/judgement;Awareness;Problem solving                 Orientation Level: Disoriented to;Place;Time;Situation Current Attention Level: Focused   Following Commands: Follows one step commands inconsistently;Follows one step commands with increased time     Problem Solving: Slow processing;Decreased initiation;Difficulty sequencing;Requires verbal cues;Requires tactile cues     General Comments  BP 214/86 - RN notified.  HR 78    Exercises     Shoulder Instructions      Home Living Family/patient expects to be discharged to:: Assisted living Living Arrangements: Alone (daughters plan on staying with her at first)   Type of Home: Assisted living [facility]) Home Access: Ramped entrance     Home Layout: One level     Bathroom Shower/Tub: Arts development officer Toilet: Handicapped height Bathroom Accessibility: Yes   Home Equipment: Environmental consultant - 2 wheels;Walker - 4 wheels;Walker - standard;Bedside commode;Shower seat;Grab bars - toilet   Additional Comments: Pt lived at [facility]F       Prior Functioning/Environment Level of Independence: Independent with assistive device(s)        Comments: h/o recent falls.  Per daughter, pt was mod I at w/c level and was receiving PT/OT         OT Problem List: Decreased strength;Decreased activity tolerance;Impaired balance (sitting and/or standing);Decreased cognition;Decreased safety awareness;Decreased knowledge of use of DME or AE;Cardiopulmonary status limiting activity      OT Treatment/Interventions: Self-care/ADL training;DME and/or AE  instruction;Therapeutic activities;Cognitive remediation/compensation;Patient/family education;Balance training    OT Goals(Current goals can be found in the care plan section) Acute Rehab OT Goals Patient Stated Goal: pt unable.  Family hopes she can increase safety and independence with mobility and ADLs  OT Goal Formulation: With family Time For Goal Achievement: 10/22/16 Potential to Achieve Goals: Good ADL Goals Pt Will Perform Grooming: with min assist;standing Pt Will Transfer to Toilet: with min assist;ambulating;regular height toilet;bedside commode;grab bars Pt Will Perform Toileting - Clothing Manipulation and hygiene: with min assist;sit to/from stand  OT Frequency: Min 2X/week   Barriers to D/C: Decreased caregiver support          Co-evaluation PT/OT/SLP Co-Evaluation/Treatment: Yes Reason for Co-Treatment: Complexity of the patient's impairments (multi-system involvement);Necessary to address cognition/behavior during functional activity;For patient/therapist safety;To address functional/ADL transfers   OT goals addressed during session: ADL's and self-care SLP goals addressed during session: Swallowing;Cognition;Communication    AM-PAC PT "6 Clicks" Daily Activity     Outcome Measure Help from another person eating meals?: A Little Help from another person taking care of personal grooming?: A Lot Help from another person toileting, which includes using toliet, bedpan, or urinal?: A Lot Help from another person bathing (including washing, rinsing, drying)?: A Lot Help from another person to put on and taking off regular upper body clothing?: A Lot Help from another person to put on and taking off regular lower body clothing?: Total 6 Click Score: 12   End of Session Equipment Utilized During Treatment: Gait belt  Nurse Communication: Mobility status  Activity Tolerance: Patient tolerated treatment well Patient left: in bed;with call bell/phone within reach;with  family/visitor present  OT Visit Diagnosis: Unsteadiness on feet (R26.81);Cognitive communication deficit (R41.841) Symptoms and signs involving cognitive functions: Cerebral infarction                Time: 1755-1820 OT Time Calculation (min): 25 min Charges:  OT General Charges $OT Visit: 1 Procedure OT Evaluation $OT Eval Moderate Complexity: 1 Procedure G-Codes:     Jeani HawkingWendi Courtenay Hirth, OTR/L [phone]   Elveria Lauderbaugh M 10/08/2016, 7:13 PM

## 2016-10-08 NOTE — ED Notes (Signed)
Family states that pt wheels herself in a w/c on a good day at baseline , Pt got pt up but stated that it was 2 person max assist

## 2016-10-08 NOTE — Progress Notes (Signed)
Internal Medicine Attending  Date: 10/08/2016  Patient name: Ebony [name] Medical record number: [id number] Date of birth: 11/27/1931 Age: 83 y.o. Gender: female  I saw and evaluated the patient. I reviewed the resident's note by Dr. Saunders RevelNedrud and I agree with the resident's findings and plans as documented in her progress note.  Please see my H&P dated 10/08/2016 and attached to Dr. Starr SinclairWinfrey's H&P dated 10/07/2016 for the specifics of my evaluation, assessment, and plan from earlier in the day.

## 2016-10-08 NOTE — NC FL2 (Addendum)
[place] MEDICAID FL2 LEVEL OF CARE SCREENING TOOL     IDENTIFICATION  Patient Name: Ebony [name] Birthdate: 10/29/1931 Sex: female Admission Date (Current Location): 10/07/2016  [facility] and [place] Number:  Producer, television/film/video and Address:  [facility], [address], [place], [place] [address]      Provider Number: [id number]  Attending Physician Name and Address:  Doneen Poisson, MD  Relative Name and Phone Number:       Current Level of Care: Hospital Recommended Level of Care: ALF Prior Approval Number:    Date Approved/Denied:   PASRR Number: [id number]  Discharge Plan: ALF    Current Diagnoses: Patient Active Problem List   Diagnosis Date Noted  . Hyponatremia 10/08/2016  . Hypertensive urgency 10/08/2016  . Polypharmacy   . Paroxysmal supraventricular tachycardia (HCC)   . Recurrent falls 09/04/2016  . Maxillary sinus fracture (HCC) 09/04/2016  . Closed fracture of nasal bone 09/04/2016  . Acute encephalopathy   . Expressive aphasia   . History of TIA (transient ischemic attack) and stroke 06/24/2016  . Depression with anxiety 06/24/2016  . Hypothyroid 06/24/2016  . GERD (gastroesophageal reflux disease) 06/24/2016  . Allergic rhinitis 06/08/2014  . DDD (degenerative disc disease), lumbar 06/08/2014  . Insomnia 12/07/2013  . Depression   . OA (osteoarthritis) 01/06/2013  . Loss of weight 07/13/2012  . Vascular dementia without behavioral disturbance 06/20/2012  . Hypothyroidism due to acquired atrophy of thyroid 04/15/2012  . Essential hypertension 05/16/2011  . Anxiety disorder 05/16/2011    Orientation RESPIRATION BLADDER Height & Weight     Self, Place  Normal Continent Weight: 151 lb (68.5 kg) Height:  5' (152.4 cm)  BEHAVIORAL SYMPTOMS/MOOD NEUROLOGICAL BOWEL NUTRITION STATUS   Calm and Cooperative   Continent Diet (Dysphagia 3 (Mech soft);Thin liquid )  Liquid Administration via: Cup;Straw Medication  Administration: Whole meds with puree Supervision: Patient able to self feed;Full supervision/cueing for compensatory strategies Compensations: Small sips/bites;Minimize environmental distractions;Slow rate;Follow solids with liquid Postural Changes: Seated upright at 90 degrees;Remain upright for at least 30 minutes after po intake   AMBULATORY STATUS COMMUNICATION OF NEEDS Skin   Extensive Assist   pt wheels herself in a w/c on a good day at baseline , Pt got pt up but stated that it was 2 person max assist  Verbally Normal                       Personal Care Assistance Level of Assistance  Bathing, Feeding, Dressing Bathing Assistance: Limited assistance Feeding assistance: Limited assistance Dressing Assistance: Limited assistance     Functional Limitations Info  Sight, Hearing, Speech Sight Info: Adequate Hearing Info: Impaired Speech Info: Adequate    SPECIAL CARE FACTORS FREQUENCY  PT (By licensed PT), OT (By licensed OT), Speech therapy     PT Frequency:3/wk with home health OT Frequency: 3/wk with home health     Speech Therapy: 2/wk with home health      Contractures Contractures Info: Not present    Additional Factors Info  Code Status, Allergies, Psychotropic Code Status Info: DNR Allergies Info: Sulfa Antibiotics, Ace Inhibitors, Biaxin Clarithromycin, Penicillins, Klor-con Potassium Chloride, Cozaar Losartan Potassium Psychotropic Info: Cymbalta  (anxiety, chronic)  Xanax           Discharge Medications: Medication List     STOP taking these medications   HYDROcodone-acetaminophen 5-325 MG tablet Commonly known as:  NORCO/VICODIN   ondansetron 4 MG tablet Commonly known  as:  ZOFRAN     TAKE these medications   acetaminophen 500 MG tablet Commonly known as:  TYLENOL Take 500 mg by mouth 2 (two) times daily.   ALPRAZolam 0.25 MG tablet Commonly known as:  XANAX Take 1 tablet (0.25 mg total) by mouth every other day. You will do this  for a total of 4 doses (8 days), then you will stop taking this medication completely. What changed:  when to take this  additional instructions   ARTIFICIAL TEARS 0.1-0.3 % Soln Generic drug:  Dextran 70-Hypromellose Place 1 drop into both eyes every morning.   aspirin 81 MG chewable tablet Chew 81 mg by mouth daily.   BIOFREEZE 4 % Gel Generic drug:  Menthol (Topical Analgesic) Apply 1 application topically every 8 (eight) hours as needed (knee pain).   DULoxetine 20 MG capsule Commonly known as:  CYMBALTA Take 1 capsule (20 mg total) by mouth every other day. You will do this for 4 doses and then stop this medication completely. What changed:  when to take this  additional instructions   fluticasone 50 MCG/ACT nasal spray Commonly known as:  FLONASE Place 2 sprays into both nostrils daily.   ibuprofen 200 MG tablet Commonly known as:  ADVIL,MOTRIN Take 200 mg by mouth every 12 (twelve) hours as needed (for pain).   levothyroxine 25 MCG tablet Commonly known as:  SYNTHROID, LEVOTHROID Take 1 tablet (25 mcg total) by mouth daily before breakfast. What changed:  when to take this   lisinopril 40 MG tablet Commonly known as:  PRINIVIL,ZESTRIL Take 1 tablet (40 mg total) by mouth daily. What changed:  medication strength  how much to take   Melatonin 3 MG Caps By mouth qhs for sleep   metoprolol succinate 100 MG 24 hr tablet Commonly known as:  TOPROL-XL Take 150 mg by mouth every morning. for HTN.   MILK OF MAGNESIA 400 MG/5ML suspension Generic drug:  magnesium hydroxide Take 30 mLs by mouth daily as needed for mild constipation.   pantoprazole 20 MG tablet Commonly known as:  PROTONIX Take 20 mg by mouth at bedtime.   Psyllium 500 MG Caps Take 500 mg by mouth at bedtime.   saccharomyces boulardii 250 MG capsule Commonly known as:  FLORASTOR Take 250 mg by mouth 2 (two) times daily.   senna 8.6 MG Tabs tablet Commonly known as:   SENOKOT Take 1 tablet by mouth See admin instructions. Mon. Wed. And Friday for constipation   THERA-M PO Take 1 tablet by mouth every morning.     Relevant Imaging Results:  Relevant Lab Results:   Additional Information SS#: [id number]  Rosetta PosnerUris, Samul Mcinroy LCSW

## 2016-10-08 NOTE — ED Notes (Signed)
Pt more awake , but still only talking at times , PT/OT ? And speech therapy in to see pt

## 2016-10-09 ENCOUNTER — Inpatient Hospital Stay (HOSPITAL_COMMUNITY): Payer: MEDICARE

## 2016-10-09 DIAGNOSIS — I69320 Aphasia following cerebral infarction: Secondary | ICD-10-CM | POA: Diagnosis not present

## 2016-10-09 DIAGNOSIS — Z8679 Personal history of other diseases of the circulatory system: Secondary | ICD-10-CM | POA: Diagnosis not present

## 2016-10-09 DIAGNOSIS — G9340 Encephalopathy, unspecified: Secondary | ICD-10-CM | POA: Diagnosis not present

## 2016-10-09 DIAGNOSIS — I69818 Other symptoms and signs involving cognitive functions following other cerebrovascular disease: Secondary | ICD-10-CM | POA: Diagnosis not present

## 2016-10-09 DIAGNOSIS — Z9181 History of falling: Secondary | ICD-10-CM | POA: Diagnosis not present

## 2016-10-09 DIAGNOSIS — R69 Illness, unspecified: Secondary | ICD-10-CM | POA: Diagnosis not present

## 2016-10-09 DIAGNOSIS — R4182 Altered mental status, unspecified: Secondary | ICD-10-CM | POA: Diagnosis not present

## 2016-10-09 DIAGNOSIS — I4581 Long QT syndrome: Secondary | ICD-10-CM | POA: Diagnosis not present

## 2016-10-09 DIAGNOSIS — E039 Hypothyroidism, unspecified: Secondary | ICD-10-CM | POA: Diagnosis not present

## 2016-10-09 DIAGNOSIS — I10 Essential (primary) hypertension: Secondary | ICD-10-CM | POA: Diagnosis not present

## 2016-10-09 DIAGNOSIS — I471 Supraventricular tachycardia: Secondary | ICD-10-CM | POA: Diagnosis not present

## 2016-10-09 LAB — VITAMIN B12: VITAMIN B 12: 258 pg/mL (ref 180–914)

## 2016-10-09 LAB — MRSA PCR SCREENING: MRSA BY PCR: NEGATIVE

## 2016-10-09 MED ORDER — METOPROLOL SUCCINATE ER 50 MG PO TB24: 150.0000 mg | ORAL_TABLET | Freq: Every morning | ORAL | Status: DC

## 2016-10-09 MED ORDER — DULOXETINE HCL 20 MG PO CPEP
20.0000 mg | ORAL_CAPSULE | Freq: Every day | ORAL | Status: DC
  Administered 2016-10-09: 20 mg via ORAL
  Filled 2016-10-09: qty 1

## 2016-10-09 MED ORDER — POLYVINYL ALCOHOL 1.4 % OP SOLN
1.0000 [drp] | Freq: Every morning | OPHTHALMIC | Status: DC
  Administered 2016-10-09: 1 [drp] via OPHTHALMIC
  Filled 2016-10-09: qty 15

## 2016-10-09 MED ORDER — ASPIRIN 81 MG PO CHEW
81.0000 mg | CHEWABLE_TABLET | Freq: Every day | ORAL | Status: DC
  Administered 2016-10-09: 81 mg via ORAL
  Filled 2016-10-09: qty 1

## 2016-10-09 MED ORDER — ALPRAZOLAM 0.25 MG PO TABS: 0.2500 mg | ORAL_TABLET | ORAL | 0 refills | Status: AC

## 2016-10-09 MED ORDER — DULOXETINE HCL 20 MG PO CPEP: 20.0000 mg | ORAL_CAPSULE | ORAL | 0 refills | Status: DC

## 2016-10-09 MED ORDER — LISINOPRIL 20 MG PO TABS
40.0000 mg | ORAL_TABLET | Freq: Every day | ORAL | Status: DC
  Administered 2016-10-09: 40 mg via ORAL
  Filled 2016-10-09: qty 2

## 2016-10-09 MED ORDER — LISINOPRIL 40 MG PO TABS: 40.0000 mg | ORAL_TABLET | Freq: Every day | ORAL | 0 refills | Status: DC

## 2016-10-09 MED ORDER — DEXTRAN 70-HYPROMELLOSE 0.1-0.3 % OP SOLN: 1.0000 [drp] | Freq: Every morning | OPHTHALMIC | Status: DC

## 2016-10-09 MED ORDER — METOPROLOL SUCCINATE ER 50 MG PO TB24
150.0000 mg | ORAL_TABLET | ORAL | Status: DC
  Administered 2016-10-09: 150 mg via ORAL
  Filled 2016-10-09: qty 1

## 2016-10-09 MED ORDER — LEVOTHYROXINE SODIUM 25 MCG PO TABS
25.0000 ug | ORAL_TABLET | Freq: Every day | ORAL | Status: DC
  Administered 2016-10-09: 25 ug via ORAL
  Filled 2016-10-09: qty 1

## 2016-10-09 MED ORDER — ALPRAZOLAM 0.25 MG PO TABS: 0.2500 mg | ORAL_TABLET | Freq: Every day | ORAL | Status: DC

## 2016-10-09 MED ORDER — LISINOPRIL 20 MG PO TABS: 20.0000 mg | ORAL_TABLET | Freq: Every day | ORAL | Status: DC

## 2016-10-09 NOTE — Procedures (Signed)
ELECTROENCEPHALOGRAM REPORT  Date of Study: 10/09/2016  Patient's Name: Joslyn Devonancy C Crumrine MRN: [id number] Date of Birth: 1931/11/14  Referring Provider: Dr. Doneen PoissonLawrence Klima  Clinical History: This is an 83 year old woman with altered mental status  Medications: acetaminophen (TYLENOL) tablet 650 mg  ALPRAZolam (XANAX) tablet 0.25 mg  aspirin chewable tablet 81 mg  DULoxetine (CYMBALTA) DR capsule 20 mg  enoxaparin (LOVENOX) injection 40 mg  labetalol (NORMODYNE,TRANDATE) injection 10 mg  levothyroxine (SYNTHROID, LEVOTHROID) tablet 25 mcg  lisinopril (PRINIVIL,ZESTRIL) tablet 40 mg  metoprolol succinate (TOPROL-XL) 24 hr tablet 150 mg  polyvinyl alcohol (LIQUIFILM TEARS) 1.4 % ophthalmic solution 1 drop   Technical Summary: A multichannel digital EEG recording measured by the international 10-20 system with electrodes applied with paste and impedances below 5000 ohms performed as portable with EKG monitoring in an awake and drowsy patient.  Hyperventilation and photic stimulation were not performed.  The digital EEG was referentially recorded, reformatted, and digitally filtered in a variety of bipolar and referential montages for optimal display.   Description: The patient is awake and drowsy during the recording.  During maximal wakefulness, there is a symmetric, medium voltage 7 Hz posterior dominant rhythm that attenuates with eye opening. This is admixed with a moderate amount of diffuse 4-5 Hz theta and 2-3 Hz delta slowing of the waking background, at times with triphasic-like appearance. There is occasional focal delta slowing over the left frontotemporal region.  During drowsiness, there is an increase in theta and delta slowing of the background. Deeper stages of sleep were not seen. Hyperventilation and photic stimulation were not performed.  There were no epileptiform discharges or electrographic seizures seen.    EKG lead showed irregular rhythm.  Impression: This awake and  drowsy EEG is abnormal due to the presence of: 1. Moderate diffuse slowing of the waking background with triphasic-like waves seen 2. Additional occasional focal slowing over the left frontotemporal region  Clinical Correlation of the above findings indicates diffuse cerebral dysfunction that is non-specific in etiology and can be seen with hypoxic/ischemic injury, toxic/metabolic encephalopathies, neurodegenerative disorders, or medication effect. Triphasic-like waves are typically seen with hepatic encephalopathy, but can be seen with other metabolic encephalopathies as well. Focal slowing over the left frontotemporal region indicates focal cerebral dysfunction in this region suggestive of underlying structural or physiologic abnormality.  The absence of epileptiform discharges does not rule out a clinical diagnosis of epilepsy.  Clinical correlation is advised.   Patrcia DollyKaren Aquino, M.D.

## 2016-10-09 NOTE — Progress Notes (Signed)
[facility] Anticipated discharge date: 8/10 Family notified: pt dtr, Darl PikesSusan Transportation by dtr Report #: [id number]  CSW signing off.  Burna SisJenna H. Wilene Pharo, LCSW Clinical Social Worker [phone]

## 2016-10-09 NOTE — Care Management Note (Signed)
Case Management Note  Patient Details  Name: Ebony [name] MRN: [id number] Date of Birth: [birth year]  Subjective/Objective: [facility] to [facility]F today. CSW facilitating.                   Action/Plan:   Expected Discharge Date:  10/09/16               Expected Discharge Plan:  Assisted Living / Rest Home  In-House Referral:  Clinical Social Work  Discharge planning Services  CM Consult  Post Acute Care Choice:    Choice offered to:     DME Arranged:    DME Agency:     HH Arranged:    HH Agency:     Status of Service:  Completed, signed off  If discussed at [employer] Meetings, dates discussed:    Additional Comments:  Leone Havenaylor, Khyra Viscuso Clinton, RN 10/09/2016, 2:51 PM

## 2016-10-09 NOTE — Progress Notes (Signed)
EEG Completed; Results Pending  

## 2016-10-09 NOTE — Progress Notes (Addendum)
Subjective: Ebony [name] was laying comfortably in bed on rounds this morning. She more talkative and responsive, however she remains easily distracted by her environment and intermittently forgets to answer a question that is asked. She states that she "feels good" and that she had no acute complaints. She denied chest pain, shortness of breath, abdominal pain, and headaches.  Objective:  Vital signs in last 24 hours: Vitals:   10/09/16 0300 10/09/16 0400 10/09/16 0730 10/09/16 0840  BP: (!) 166/75 (!) 157/91 (!) 190/88 (!) 184/93  Pulse:   87 78  Resp: (!) 22  20   Temp: 98.9 F (37.2 C)  98.5 F (36.9 C)   TempSrc: Axillary  Oral   SpO2: 94%  95%   Weight:      Height:       Physical Exam  Constitutional:  Patient laying comfortably in bed this morning in no acute distress without a [place] in place.  Cardiovascular: Normal rate, regular rhythm and intact distal pulses.  Exam reveals no friction rub.   No murmur heard. Pulmonary/Chest: Effort normal. No respiratory distress. She has no wheezes.  Abdominal: Soft. She exhibits no distension. There is no tenderness. There is no guarding.  Musculoskeletal: She exhibits no edema (of lower extremities bilaterally) or tenderness (of lower extremities bilaterally).  Neurological:  Patient oriented to self, setting, and date of birth. Unable to respond to orientation questions regarding time/month/year. Mild asymmetry in smile, with left side side showing a subtle difference compared to right. Tongue midline. Patient did not cooperate with sensation testing or further motor testing.   Skin: Skin is warm and dry. Capillary refill takes less than 2 seconds. No rash noted. She is not diaphoretic. No erythema.   Assessment/Plan:  Principal Problem:   Acute encephalopathy Active Problems:   Essential hypertension   Hypothyroidism due to acquired atrophy of thyroid   Vascular dementia without behavioral disturbance   GERD (gastroesophageal  reflux disease)   Expressive aphasia   Recurrent falls   Paroxysmal supraventricular tachycardia (HCC)   Hyponatremia   Hypertensive urgency  Ebony [name] is a 83 year old woman with a PMH of ischemic stroke, SAH, hospitalization for multiple falls, vascular dementia, and expressive aphasia who presented to the ED after the staff at her SNF thought she looked altered relative to her baseline and noticed a facial droop. The patient was also found down, suggesting that she may have fallen. She was admitted to the internal medicine teaching team and the following specific problems were addressed on admission:   Altered Mental Status: The patient was hypertensive on admission and the left facial droop was concerning for acute stroke. The patient's stroke workup in the ED, including CT head and MR brain, were negative for signs of ischemic or hemorrhagic stroke. Her workup, including urinalysis, CK,  CBC, TSH, lactic acid, B12 and ammonia levels were all within normal limits. Right now her presentation is most consistent with polypharmacy, as the patient's MAR from her SNF show administration of concerning medications, including Xanax -Wean home xanax dose to once per day, with the goal of stopping completely about 1 week after discharge by taking it 4 doses q2days. -We will decrease her duloxetine to 20 mg on discharge, with the goal of stopping completely about 1 week after discharge by taking it 4 doses q2days. -MOCA = 9 with points for orientation, abstract thought, naming, and attention.  HTN: There was initial concern for hypertensive urgency given the patient's >200 SBP on admission  and altered mental status. Her MRI and CT Head were not concerning for acute stroke and no signs of PRES 2/2 increased BP on Brain MRI. There were no other signs of end organ damage 2/2 hypertension.  -Restart lisinopril at increased dose of 40 mg (previously 20 mg at home)  Hx of paroxysmal SVT with prolonged QT  interval on admission: Patient was kept on telemetry and monitored while in ED. No concern for SVT or torsades. The patient's EKG on admission showed QTc of 511 msec and we plan to stop all meds, including home ondansetron, which could prolong the QT interval on discharge.  Hx of hypothyroidism: TSH = 1.183 on arrival. Continue home synthroid, 25 mcg daily.  VTE prophylaxis: Lovenox 40 mg  Diet: Dysphagia 3 on speech evaluation, can swallow thin liquids and take oral medications  Dispo: Anticipated discharge in approximately 1 day to ALF.  Rozann LeschesNedrud, Ebony Fidalgo, MD 10/09/2016, 10:37 AM Pager: [phone]

## 2016-10-09 NOTE — Progress Notes (Signed)
SLP Cancellation Note  Patient Details Name: Ebony [name] MRN: [id number] DOB: 07-23-1931   Cancelled treatment:       Reason Eval/Treat Not Completed: Other (comment). Pt just completed lunch, tolerating soft foods well, mild cognitive deficits persist per family.  About to d/c.  Recommend f/u with SLP at next level of care for communication and cognitive function  [facility] CampusBonnie Meshach Perry, MA CCC-SLP [phone]  Claudine MoutonDeBlois, Ramona Ruark Caroline 10/09/2016, 2:21 PM

## 2016-10-09 NOTE — Progress Notes (Signed)
All discharge instructions reviewed in detail with pt and pt's daughter with understanding verbalized by daughter. No questions or concerns prior to discharge. [facility], transported by daughter. Attempted to call report to receiving RN x2; left voicemail and also left call back number with secretary @ facility.

## 2016-10-09 NOTE — Progress Notes (Signed)
Internal Medicine Attending  Date: 10/09/2016  Patient name: Ebony [name] Medical record number: [id number] Date of birth: [birth year] Age: 83 y.o. Gender: female  I saw and evaluated the patient. I reviewed the resident's note by Dr. Saunders RevelNedrud and I agree with the resident's findings and plans as documented in her progress note.  Ebony [name] was more interactive when seen on rounds this morning then yesterday. This follows the pattern of recovery seen during the July admission. We have made some adjustments in her medication regimen with the plan to fully wean off those medications that cannot be safely stopped abruptly. Placement is now the issue and I agree with physical therapy and occupational therapy that a skilled nursing facility would be in Ebony [name]'s best interest at this point. Ultimately, the final decision lies with her family. I believe she is stable for discharge today if placement can be arranged with the plan that I lined in my admission H&P.

## 2016-10-10 ENCOUNTER — Emergency Department (HOSPITAL_COMMUNITY): Payer: Medicare HMO

## 2016-10-10 ENCOUNTER — Encounter (HOSPITAL_COMMUNITY): Payer: Self-pay | Admitting: Emergency Medicine

## 2016-10-10 ENCOUNTER — Emergency Department (HOSPITAL_COMMUNITY)
Admission: EM | Admit: 2016-10-10 | Discharge: 2016-10-10 | Disposition: A | Discharge status: Skilled Nursing Facility | Payer: Medicare HMO | Attending: Emergency Medicine | Admitting: Emergency Medicine

## 2016-10-10 DIAGNOSIS — Y92129 Unspecified place in nursing home as the place of occurrence of the external cause: Secondary | ICD-10-CM

## 2016-10-10 DIAGNOSIS — I10 Essential (primary) hypertension: Secondary | ICD-10-CM | POA: Insufficient documentation

## 2016-10-10 DIAGNOSIS — S199XXA Unspecified injury of neck, initial encounter: Secondary | ICD-10-CM | POA: Diagnosis not present

## 2016-10-10 DIAGNOSIS — Z79899 Other long term (current) drug therapy: Secondary | ICD-10-CM | POA: Insufficient documentation

## 2016-10-10 DIAGNOSIS — R51 Headache: Secondary | ICD-10-CM | POA: Insufficient documentation

## 2016-10-10 DIAGNOSIS — M545 Low back pain: Secondary | ICD-10-CM | POA: Diagnosis not present

## 2016-10-10 DIAGNOSIS — E039 Hypothyroidism, unspecified: Secondary | ICD-10-CM | POA: Insufficient documentation

## 2016-10-10 DIAGNOSIS — Z7982 Long term (current) use of aspirin: Secondary | ICD-10-CM | POA: Diagnosis not present

## 2016-10-10 DIAGNOSIS — S098XXA Other specified injuries of head, initial encounter: Secondary | ICD-10-CM | POA: Diagnosis not present

## 2016-10-10 DIAGNOSIS — M549 Dorsalgia, unspecified: Secondary | ICD-10-CM | POA: Diagnosis not present

## 2016-10-10 DIAGNOSIS — S0181XA Laceration without foreign body of other part of head, initial encounter: Secondary | ICD-10-CM | POA: Diagnosis not present

## 2016-10-10 DIAGNOSIS — R69 Illness, unspecified: Secondary | ICD-10-CM | POA: Diagnosis not present

## 2016-10-10 DIAGNOSIS — S0003XA Contusion of scalp, initial encounter: Secondary | ICD-10-CM | POA: Diagnosis not present

## 2016-10-10 DIAGNOSIS — W19XXXA Unspecified fall, initial encounter: Secondary | ICD-10-CM

## 2016-10-10 HISTORY — DX: Unspecified dementia, unspecified severity, without behavioral disturbance, psychotic disturbance, mood disturbance, and anxiety: F03.90

## 2016-10-10 NOTE — ED Triage Notes (Signed)
[place] [facility] [address] [place] [facility] post [place]. Staff reports that they found pt sitting on the floor. Pt has no complaints or sign of injury. Staff sending pt for eval per policy. Pt has hx of alzheimer's and is a poor historian. Pt is at her normal baseline per staff.

## 2016-10-10 NOTE — ED Notes (Signed)
Pt reports that she was getting out of bed this am. She sts her feet slipped on the floor and she slid onto her bottom. Pt adds that she bumped the back of her head on her chest of drawers. Pt sts staff heard her and came in immediately. Pt denies injury or pain. She is most concerned with missing breakfast

## 2016-10-10 NOTE — ED Notes (Signed)
Bed: WA18 Expected date:  Expected time:  Means of arrival:  Comments: fall 

## 2016-10-10 NOTE — ED Notes (Signed)
[facility] called to transport pt back to facility 

## 2016-10-10 NOTE — ED Provider Notes (Signed)
[facility]-EMERGENCY DEPT Provider Note   CSN: [id number] Arrival date & time: 10/10/16  4098     History   Chief Complaint Chief Complaint  Patient presents with  . Fall    HPI Ebony [name] is a 83 y.o. female.  The history is provided by the patient, the EMS personnel and the nursing home. The history is limited by the condition of the patient (Hx dementia).  Fall   Pt was seen at 0845. Per EMS, NH report and pt: Pt states she was getting out of bed and her foot slipped on the floor. States she fell to the floor, landing on her bottom. States she hit the back of her head against a chest of drawers. No reported LOC, no AMS from baseline, no CP/SOB, no abd pain, no N/V/D, no focal motor weakness, no tingling/numbness in extremities.  Past Medical History:  Diagnosis Date  . Acute ischemic stroke (HCC) 04/11/2012  . Allergic rhinitis   . Anxiety   . Arthritis    Knees  . Dementia   . Depression   . Fibrocystic breast disease   . Hiatal hernia   . Hyperlipidemia   . Hypertension   . IFG (impaired fasting glucose)   . SDH (subdural hematoma) (HCC) 05/16/2011  . Subarachnoid hemorrhage following injury (HCC) 05/16/2011  . Subarachnoid hemorrhage following injury (HCC) 05/16/2011  . Unspecified hypothyroidism 04/15/2012    Patient Active Problem List   Diagnosis Date Noted  . Hyponatremia 10/08/2016  . Hypertensive urgency 10/08/2016  . Polypharmacy   . Paroxysmal supraventricular tachycardia (HCC)   . Recurrent falls 09/04/2016  . Maxillary sinus fracture (HCC) 09/04/2016  . Closed fracture of nasal bone 09/04/2016  . Acute encephalopathy   . Expressive aphasia   . History of TIA (transient ischemic attack) and stroke 06/24/2016  . Depression with anxiety 06/24/2016  . Hypothyroid 06/24/2016  . GERD (gastroesophageal reflux disease) 06/24/2016  . Allergic rhinitis 06/08/2014  . DDD (degenerative disc disease), lumbar 06/08/2014  . Insomnia 12/07/2013  . Depression     . OA (osteoarthritis) 01/06/2013  . Loss of weight 07/13/2012  . Vascular dementia without behavioral disturbance 06/20/2012  . Hypothyroidism due to acquired atrophy of thyroid 04/15/2012  . Essential hypertension 05/16/2011  . Anxiety disorder 05/16/2011    Past Surgical History:  Procedure Laterality Date  . ABDOMINAL HYSTERECTOMY    . BREAST SURGERY    . EYE SURGERY    . TONSILLECTOMY      OB History    No data available       Home Medications    Prior to Admission medications   Medication Sig Start Date End Date Taking? Authorizing Provider  acetaminophen (TYLENOL) 500 MG tablet Take 500 mg by mouth 2 (two) times daily.    [provider]  ALPRAZolam Prudy Feeler) 0.25 MG tablet Take 1 tablet (0.25 mg total) by mouth every other day. You will do this for a total of 4 doses (8 days), then you will stop taking this medication completely. 10/09/16 10/16/16  Rozann Lesches, MD  aspirin 81 MG chewable tablet Chew 81 mg by mouth daily.    [provider]  Dextran 70-Hypromellose (ARTIFICIAL TEARS) 0.1-0.3 % SOLN Place 1 drop into both eyes every morning.     [provider]  DULoxetine (CYMBALTA) 20 MG capsule Take 1 capsule (20 mg total) by mouth every other day. You will do this for 4 doses and then stop this medication completely. 10/09/16 10/16/16  Nedrud,  Jeanella Flattery, MD  fluticasone (FLONASE) 50 MCG/ACT nasal spray Place 2 sprays into both nostrils daily. 06/08/14   Sharon Seller, NP  HYDROcodone-acetaminophen (NORCO/VICODIN) 5-325 MG tablet Take one tablet by mouth every 8 hours as needed for pain 06/26/16   Maxie Barb, MD  ibuprofen (ADVIL,MOTRIN) 200 MG tablet Take 200 mg by mouth every 12 (twelve) hours as needed (for pain).     [provider]  levothyroxine (SYNTHROID, LEVOTHROID) 25 MCG tablet Take 1 tablet (25 mcg total) by mouth daily before breakfast. Patient taking differently: Take 25 mcg by mouth at bedtime.  04/15/12    Christiane Ha, MD  lisinopril (PRINIVIL,ZESTRIL) 40 MG tablet Take 1 tablet (40 mg total) by mouth daily. 10/09/16   Rozann Lesches, MD  magnesium hydroxide (MILK OF MAGNESIA) 400 MG/5ML suspension Take 30 mLs by mouth daily as needed for mild constipation.    [provider]  Melatonin 3 MG CAPS By mouth qhs for sleep 11/17/13   Sharon Seller, NP  Menthol, Topical Analgesic, (BIOFREEZE) 4 % GEL Apply 1 application topically every 8 (eight) hours as needed (knee pain).    [provider]  metoprolol (TOPROL-XL) 100 MG 24 hr tablet Take 150 mg by mouth every morning. for HTN.    [provider]  Multiple Vitamins-Minerals (THERA-M PO) Take 1 tablet by mouth every morning.    [provider]  pantoprazole (PROTONIX) 20 MG tablet Take 20 mg by mouth at bedtime.     [provider]  Psyllium 500 MG CAPS Take 500 mg by mouth at bedtime.    [provider]  saccharomyces boulardii (FLORASTOR) 250 MG capsule Take 250 mg by mouth 2 (two) times daily.    [provider]  senna (SENOKOT) 8.6 MG TABS tablet Take 1 tablet by mouth See admin instructions. Mon. Wed. And Friday for constipation    [provider]    Family History Family History  Problem Relation Age of Onset  . Lung cancer Sister   . CVA Neg Hx   . Diabetes Neg Hx   . Hypertension Neg Hx     Social History Social History  Substance Use Topics  . Smoking status: Never Smoker  . Smokeless tobacco: Never Used  . Alcohol use No     Allergies   Sulfa antibiotics; Ace inhibitors; Biaxin [clarithromycin]; Penicillins; Klor-con [potassium chloride]; and Cozaar [losartan potassium]   Review of Systems Review of Systems  Unable to perform ROS: Dementia     Physical Exam Updated Vital Signs BP (!) 196/102 (BP Location: Left Arm)   Pulse 73   Temp 98.1 F (36.7 C) (Oral)   SpO2 97%   Physical Exam 0850: Physical examination: Vital signs and O2  SAT: Reviewed; Constitutional: Well developed, Well nourished, Well hydrated, In no acute distress; Head and Face: Normocephalic, small contusion left scalp, no open wounds; Eyes: EOMI, PERRL, No scleral icterus; ENMT: Mouth and pharynx normal, Left TM normal, Right TM normal, Mucous membranes moist; Neck: Immobilized in C-collar, Trachea midline; Spine: No midline CS, TS, LS tenderness.; Cardiovascular: Regular rate and rhythm, No gallop; Respiratory: Breath sounds clear & equal bilaterally, No wheezes, Normal respiratory effort/excursion; Chest: Nontender, No deformity, Movement normal, No crepitus, No abrasions or ecchymosis.; Abdomen: Soft, Nontender, Nondistended, Normal bowel sounds, No abrasions or ecchymosis.; Genitourinary: No CVA tenderness;; Extremities: No deformity, Full range of motion major/large joints of bilat UE's and LE's without pain or tenderness to palp, Neurovascularly intact, Pulses normal,  No tenderness, No edema, Pelvis stable; Neuro: Awake, alert, mildly confused re: time.  Major CN grossly intact. Speech clear. Moves all extremities on stretcher and to command without apparent gross focal motor deficits.; Skin: Color normal, Warm, Dry   ED Treatments / Results  Labs (all labs ordered are listed, but only abnormal results are displayed)   EKG  EKG Interpretation None       Radiology   Procedures Procedures (including critical care time)  Medications Ordered in ED Medications - No data to display   Initial Impression / Assessment and Plan / ED Course  I have reviewed the triage vital signs and the nursing notes.  Pertinent labs & imaging results that were available during my care of the patient were reviewed by me and considered in my medical decision making (see chart for details).  MDM Reviewed: previous chart, nursing note and vitals Interpretation: x-ray and CT scan   Dg Lumbar Spine Complete Result Date: 10/10/2016 CLINICAL DATA:  Slip and false  morning with low back pain, initial encounter EXAM: LUMBAR SPINE - COMPLETE 4+ VIEW COMPARISON:  None. FINDINGS: Five lumbar type vertebral bodies are well visualized. Mild scoliosis concave to the left is noted. Vertebral body height is well maintained. Multilevel osteophytic changes and disc space narrowing are seen. Retrolisthesis of L2 on L3 and L3 on L4 is noted of a degenerative nature. No acute soft tissue abnormality is seen. IMPRESSION: Multilevel degenerative changes without acute abnormality. Electronically Signed   By: Alcide CleverMark  Lukens M.D.   On: 10/10/2016 09:35   Ct Head Wo Contrast Result Date: 10/10/2016 CLINICAL DATA:  Fall while getting out of bed this morning with injury to back of head. EXAM: CT HEAD WITHOUT CONTRAST CT CERVICAL SPINE WITHOUT CONTRAST TECHNIQUE: Multidetector CT imaging of the head and cervical spine was performed following the standard protocol without intravenous contrast. Multiplanar CT image reconstructions of the cervical spine were also generated. COMPARISON:  09/04/2016 and 03/31/2016 FINDINGS: CT HEAD FINDINGS Brain: Ventricles, cisterns and other CSF spaces are within normal relative to patient's age. There is moderate chronic ischemic microvascular disease present. There is no mass, mass effect, shift of midline structures or acute hemorrhage. No evidence of acute infarction. Vascular: No hyperdense vessel or unexpected calcification. Skull: Minimal soft tissue swelling over the inferior left frontal scalp just left of midline at the level of the orbit. No fracture. Sinuses/Orbits: No acute finding. Other: Degenerate change of the temporomandibular joints bilaterally. CT CERVICAL SPINE FINDINGS Alignment: Reversal the normal cervical lordosis with subtle anterior subluxation of C4 on C5 unchanged and likely degenerative. Skull base and vertebrae: Mild to moderate spondylosis throughout the cervical spine. Moderate uncovertebral joint spurring and facet arthropathy. No  compression fracture or acute injury identified. Mild bilateral neural foraminal narrowing at multiple levels due to adjacent bony spurring. Soft tissues and spinal canal: No prevertebral fluid or swelling. No visible canal hematoma. Disc levels: Mild disc space narrowing at the C3-4, C5-6 and C6-7 levels unchanged. Upper chest: Within normal. Other: None. IMPRESSION: No acute intracranial findings. Small left paramidline inferior frontal scalp contusion. No fracture. Chronic ischemic microvascular disease. No acute cervical spine injury. Mild to moderate spondylosis throughout the cervical spinal multilevel disc disease and multilevel neural foraminal narrowing. Stable chronic degenerate subluxation of C4 on C5. Stable reversal of the normal cervical lordosis. Electronically Signed   By: Elberta Fortisaniel  Boyle M.D.   On: 10/10/2016 09:30   Ct Cervical Spine Wo Contrast Result Date: 10/10/2016 CLINICAL DATA:  Fall while getting out of bed this morning with injury to back of head. EXAM: CT HEAD WITHOUT CONTRAST CT CERVICAL SPINE WITHOUT CONTRAST TECHNIQUE: Multidetector CT imaging of the head and cervical spine was performed following the standard protocol without intravenous contrast. Multiplanar CT image reconstructions of the cervical spine were also generated. COMPARISON:  09/04/2016 and 03/31/2016 FINDINGS: CT HEAD FINDINGS Brain: Ventricles, cisterns and other CSF spaces are within normal relative to patient's age. There is moderate chronic ischemic microvascular disease present. There is no mass, mass effect, shift of midline structures or acute hemorrhage. No evidence of acute infarction. Vascular: No hyperdense vessel or unexpected calcification. Skull: Minimal soft tissue swelling over the inferior left frontal scalp just left of midline at the level of the orbit. No fracture. Sinuses/Orbits: No acute finding. Other: Degenerate change of the temporomandibular joints bilaterally. CT CERVICAL SPINE FINDINGS  Alignment: Reversal the normal cervical lordosis with subtle anterior subluxation of C4 on C5 unchanged and likely degenerative. Skull base and vertebrae: Mild to moderate spondylosis throughout the cervical spine. Moderate uncovertebral joint spurring and facet arthropathy. No compression fracture or acute injury identified. Mild bilateral neural foraminal narrowing at multiple levels due to adjacent bony spurring. Soft tissues and spinal canal: No prevertebral fluid or swelling. No visible canal hematoma. Disc levels: Mild disc space narrowing at the C3-4, C5-6 and C6-7 levels unchanged. Upper chest: Within normal. Other: None. IMPRESSION: No acute intracranial findings. Small left paramidline inferior frontal scalp contusion. No fracture. Chronic ischemic microvascular disease. No acute cervical spine injury. Mild to moderate spondylosis throughout the cervical spinal multilevel disc disease and multilevel neural foraminal narrowing. Stable chronic degenerate subluxation of C4 on C5. Stable reversal of the normal cervical lordosis. Electronically Signed   By: Elberta Fortis M.D.   On: 10/10/2016 09:30    1000:  CT/XR reassuring. Will d/c back to NH.   Final Clinical Impressions(s) / ED Diagnoses   Final diagnoses:  None    New Prescriptions New Prescriptions   No medications on file      Samuel Jester, DO 10/14/16 1529

## 2016-10-10 NOTE — Discharge Instructions (Signed)
Take your usual prescriptions as previously directed.  Call your regular medical doctor Monday to schedule a follow up appointment within the next 2 days.  Return to the Emergency Department immediately sooner if worsening.

## 2016-10-10 NOTE — ED Notes (Signed)
[facility] arrived to transport pt to [facility]. Attempted to call report, but there was only an answering machine. Notified [facility] to advise staff.

## 2016-10-12 DIAGNOSIS — M1990 Unspecified osteoarthritis, unspecified site: Secondary | ICD-10-CM | POA: Diagnosis not present

## 2016-10-12 DIAGNOSIS — S51812D Laceration without foreign body of left forearm, subsequent encounter: Secondary | ICD-10-CM | POA: Diagnosis not present

## 2016-10-12 DIAGNOSIS — I69320 Aphasia following cerebral infarction: Secondary | ICD-10-CM | POA: Diagnosis not present

## 2016-10-12 DIAGNOSIS — R69 Illness, unspecified: Secondary | ICD-10-CM | POA: Diagnosis not present

## 2016-10-12 DIAGNOSIS — S02401D Maxillary fracture, unspecified, subsequent encounter for fracture with routine healing: Secondary | ICD-10-CM | POA: Diagnosis not present

## 2016-10-12 DIAGNOSIS — I10 Essential (primary) hypertension: Secondary | ICD-10-CM | POA: Diagnosis not present

## 2016-10-13 DIAGNOSIS — K5901 Slow transit constipation: Secondary | ICD-10-CM | POA: Diagnosis not present

## 2016-10-13 DIAGNOSIS — G894 Chronic pain syndrome: Secondary | ICD-10-CM | POA: Diagnosis not present

## 2016-10-13 DIAGNOSIS — I10 Essential (primary) hypertension: Secondary | ICD-10-CM | POA: Diagnosis not present

## 2016-10-13 DIAGNOSIS — R69 Illness, unspecified: Secondary | ICD-10-CM | POA: Diagnosis not present

## 2016-10-13 DIAGNOSIS — E038 Other specified hypothyroidism: Secondary | ICD-10-CM | POA: Diagnosis not present

## 2016-10-13 DIAGNOSIS — R269 Unspecified abnormalities of gait and mobility: Secondary | ICD-10-CM | POA: Diagnosis not present

## 2016-10-14 DIAGNOSIS — S51812D Laceration without foreign body of left forearm, subsequent encounter: Secondary | ICD-10-CM | POA: Diagnosis not present

## 2016-10-14 DIAGNOSIS — I10 Essential (primary) hypertension: Secondary | ICD-10-CM | POA: Diagnosis not present

## 2016-10-14 DIAGNOSIS — R69 Illness, unspecified: Secondary | ICD-10-CM | POA: Diagnosis not present

## 2016-10-14 DIAGNOSIS — I69320 Aphasia following cerebral infarction: Secondary | ICD-10-CM | POA: Diagnosis not present

## 2016-10-14 DIAGNOSIS — M1990 Unspecified osteoarthritis, unspecified site: Secondary | ICD-10-CM | POA: Diagnosis not present

## 2016-10-14 DIAGNOSIS — S02401D Maxillary fracture, unspecified, subsequent encounter for fracture with routine healing: Secondary | ICD-10-CM | POA: Diagnosis not present

## 2016-10-15 DIAGNOSIS — S02401D Maxillary fracture, unspecified, subsequent encounter for fracture with routine healing: Secondary | ICD-10-CM | POA: Diagnosis not present

## 2016-10-15 DIAGNOSIS — I69320 Aphasia following cerebral infarction: Secondary | ICD-10-CM | POA: Diagnosis not present

## 2016-10-15 DIAGNOSIS — R69 Illness, unspecified: Secondary | ICD-10-CM | POA: Diagnosis not present

## 2016-10-15 DIAGNOSIS — S51812D Laceration without foreign body of left forearm, subsequent encounter: Secondary | ICD-10-CM | POA: Diagnosis not present

## 2016-10-15 DIAGNOSIS — I10 Essential (primary) hypertension: Secondary | ICD-10-CM | POA: Diagnosis not present

## 2016-10-15 DIAGNOSIS — M1990 Unspecified osteoarthritis, unspecified site: Secondary | ICD-10-CM | POA: Diagnosis not present

## 2016-10-16 DIAGNOSIS — R69 Illness, unspecified: Secondary | ICD-10-CM | POA: Diagnosis not present

## 2016-10-16 DIAGNOSIS — I10 Essential (primary) hypertension: Secondary | ICD-10-CM | POA: Diagnosis not present

## 2016-10-16 DIAGNOSIS — I69320 Aphasia following cerebral infarction: Secondary | ICD-10-CM | POA: Diagnosis not present

## 2016-10-16 DIAGNOSIS — M1990 Unspecified osteoarthritis, unspecified site: Secondary | ICD-10-CM | POA: Diagnosis not present

## 2016-10-16 DIAGNOSIS — S02401D Maxillary fracture, unspecified, subsequent encounter for fracture with routine healing: Secondary | ICD-10-CM | POA: Diagnosis not present

## 2016-10-16 DIAGNOSIS — S51812D Laceration without foreign body of left forearm, subsequent encounter: Secondary | ICD-10-CM | POA: Diagnosis not present

## 2016-10-17 ENCOUNTER — Encounter (HOSPITAL_COMMUNITY): Payer: Self-pay | Admitting: Emergency Medicine

## 2016-10-17 ENCOUNTER — Emergency Department (HOSPITAL_COMMUNITY)
Admission: EM | Admit: 2016-10-17 | Discharge: 2016-10-18 | Disposition: A | Discharge status: Skilled Nursing Facility | Payer: Medicare HMO | Attending: Emergency Medicine | Admitting: Emergency Medicine

## 2016-10-17 DIAGNOSIS — W01190A Fall on same level from slipping, tripping and stumbling with subsequent striking against furniture, initial encounter: Secondary | ICD-10-CM | POA: Diagnosis not present

## 2016-10-17 DIAGNOSIS — S098XXA Other specified injuries of head, initial encounter: Secondary | ICD-10-CM | POA: Diagnosis not present

## 2016-10-17 DIAGNOSIS — E039 Hypothyroidism, unspecified: Secondary | ICD-10-CM | POA: Diagnosis not present

## 2016-10-17 DIAGNOSIS — Y9301 Activity, walking, marching and hiking: Secondary | ICD-10-CM | POA: Diagnosis not present

## 2016-10-17 DIAGNOSIS — Y999 Unspecified external cause status: Secondary | ICD-10-CM | POA: Insufficient documentation

## 2016-10-17 DIAGNOSIS — Z79899 Other long term (current) drug therapy: Secondary | ICD-10-CM | POA: Insufficient documentation

## 2016-10-17 DIAGNOSIS — S0181XA Laceration without foreign body of other part of head, initial encounter: Secondary | ICD-10-CM | POA: Diagnosis not present

## 2016-10-17 DIAGNOSIS — Z8673 Personal history of transient ischemic attack (TIA), and cerebral infarction without residual deficits: Secondary | ICD-10-CM | POA: Insufficient documentation

## 2016-10-17 DIAGNOSIS — S0101XA Laceration without foreign body of scalp, initial encounter: Secondary | ICD-10-CM | POA: Diagnosis not present

## 2016-10-17 DIAGNOSIS — W19XXXA Unspecified fall, initial encounter: Secondary | ICD-10-CM

## 2016-10-17 DIAGNOSIS — Z7982 Long term (current) use of aspirin: Secondary | ICD-10-CM | POA: Insufficient documentation

## 2016-10-17 DIAGNOSIS — I10 Essential (primary) hypertension: Secondary | ICD-10-CM | POA: Diagnosis not present

## 2016-10-17 DIAGNOSIS — S0990XA Unspecified injury of head, initial encounter: Secondary | ICD-10-CM | POA: Diagnosis not present

## 2016-10-17 DIAGNOSIS — Y92129 Unspecified place in nursing home as the place of occurrence of the external cause: Secondary | ICD-10-CM | POA: Insufficient documentation

## 2016-10-17 NOTE — ED Provider Notes (Signed)
[facility]-EMERGENCY DEPT Provider Note   CSN: [id number] Arrival date & time: 10/17/16  2225    History   Chief Complaint Chief Complaint  Patient presents with  . Fall  . Head Laceration    HPI Ebony [name] is a 83 y.o. female.  83 year old female with a history of subdural hematoma and subarachnoid hemorrhage, hypertension, depression, dementia, and CVA presents to the emergency department for [facility] after a fall which occurred at 2130 this evening. Patient states that she got up from her chair to water her poinsettia when she lost her balance falling backward into a table and chairs. She denies any loss of consciousness. She notes that she is supposed to be bound to a wheelchair, but does not use this compliantly. She denies any complaints of pain. Specifically, she denies headache, neck pain, vision changes, nausea, vomiting, extremity numbness or weakness. The patient is not on blood thinners. She was last evaluated for a fall 3 days ago.   The history is provided by the patient. No language interpreter was used.  Fall   Head Laceration     Past Medical History:  Diagnosis Date  . Acute ischemic stroke (HCC) 04/11/2012  . Allergic rhinitis   . Anxiety   . Arthritis    Knees  . Dementia   . Depression   . Fibrocystic breast disease   . Hiatal hernia   . Hyperlipidemia   . Hypertension   . IFG (impaired fasting glucose)   . SDH (subdural hematoma) (HCC) 05/16/2011  . Subarachnoid hemorrhage following injury (HCC) 05/16/2011  . Subarachnoid hemorrhage following injury (HCC) 05/16/2011  . Unspecified hypothyroidism 04/15/2012    Patient Active Problem List   Diagnosis Date Noted  . Hyponatremia 10/08/2016  . Hypertensive urgency 10/08/2016  . Polypharmacy   . Paroxysmal supraventricular tachycardia (HCC)   . Recurrent falls 09/04/2016  . Maxillary sinus fracture (HCC) 09/04/2016  . Closed fracture of nasal bone 09/04/2016  . Acute encephalopathy   . Expressive  aphasia   . History of TIA (transient ischemic attack) and stroke 06/24/2016  . Depression with anxiety 06/24/2016  . Hypothyroid 06/24/2016  . GERD (gastroesophageal reflux disease) 06/24/2016  . Allergic rhinitis 06/08/2014  . DDD (degenerative disc disease), lumbar 06/08/2014  . Insomnia 12/07/2013  . Depression   . OA (osteoarthritis) 01/06/2013  . Loss of weight 07/13/2012  . Vascular dementia without behavioral disturbance 06/20/2012  . Hypothyroidism due to acquired atrophy of thyroid 04/15/2012  . Essential hypertension 05/16/2011  . Anxiety disorder 05/16/2011    Past Surgical History:  Procedure Laterality Date  . ABDOMINAL HYSTERECTOMY    . BREAST SURGERY    . EYE SURGERY    . TONSILLECTOMY      OB History    No data available       Home Medications    Prior to Admission medications   Medication Sig Start Date End Date Taking? Authorizing Provider  acetaminophen (TYLENOL) 500 MG tablet Take 500 mg by mouth 2 (two) times daily.   Yes [provider]  ALPRAZolam (XANAX) 0.25 MG tablet Take 0.25 mg by mouth every other day.   Yes [provider]  aspirin 81 MG chewable tablet Chew 81 mg by mouth daily.   Yes [provider]  Dextran 70-Hypromellose (ARTIFICIAL TEARS) 0.1-0.3 % SOLN Place 1 drop into both eyes every morning.    Yes [provider]  fluticasone (FLONASE) 50 MCG/ACT nasal spray Place 2 sprays into both nostrils daily.  06/08/14  Yes Sharon Seller, NP  levothyroxine (SYNTHROID, LEVOTHROID) 25 MCG tablet Take 1 tablet (25 mcg total) by mouth daily before breakfast. Patient taking differently: Take 25 mcg by mouth at bedtime.  04/15/12  Yes Christiane Ha, MD  lisinopril (PRINIVIL,ZESTRIL) 20 MG tablet Take 40 mg by mouth daily.  10/01/16  Yes [provider]  magnesium hydroxide (MILK OF MAGNESIA) 400 MG/5ML suspension Take 30 mLs by mouth daily as needed for mild constipation.   Yes [provider]  Melatonin 3 MG CAPS By mouth qhs for sleep Patient taking differently: Take 3 mg by mouth at bedtime.  11/17/13  Yes Sharon Seller, NP  Menthol, Topical Analgesic, (BIOFREEZE) 4 % GEL Apply 1 application topically every 8 (eight) hours as needed (knee pain).   Yes [provider]  metoprolol (TOPROL-XL) 100 MG 24 hr tablet Take 150 mg by mouth every morning. for HTN.   Yes [provider]  Multiple Vitamins-Minerals (THERA-M PO) Take 1 tablet by mouth every morning.   Yes [provider]  ondansetron (ZOFRAN) 4 MG tablet Take 4 mg by mouth every 8 (eight) hours as needed for nausea or vomiting.  10/04/16  Yes [provider]  pantoprazole (PROTONIX) 20 MG tablet Take 20 mg by mouth at bedtime.    Yes [provider]  Psyllium 500 MG CAPS Take 500 mg by mouth at bedtime.   Yes [provider]  saccharomyces boulardii (FLORASTOR) 250 MG capsule Take 250 mg by mouth 2 (two) times daily.   Yes [provider]  senna (SENOKOT) 8.6 MG TABS tablet Take 1 tablet by mouth 3 (three) times a week. Mon. Wed. And Friday for constipation    Yes [provider]  DULoxetine (CYMBALTA) 20 MG capsule Take 1 capsule (20 mg total) by mouth every other day. You will do this for 4 doses and then stop this medication completely. Patient not taking: Reported on 10/18/2016 10/09/16 10/18/16  Rozann Lesches, MD  HYDROcodone-acetaminophen (NORCO/VICODIN) 5-325 MG tablet Take one tablet by mouth every 8 hours as needed for pain Patient not taking: Reported on 10/10/2016 06/26/16   Maxie Barb, MD    Family History Family History  Problem Relation Age of Onset  . Lung cancer Sister   . CVA Neg Hx   . Diabetes Neg Hx   . Hypertension Neg Hx     Social History Social History  Substance Use Topics  . Smoking status: Never Smoker  . Smokeless tobacco: Never Used  . Alcohol use No     Allergies   Sulfa antibiotics; Ace inhibitors;  Biaxin [clarithromycin]; Penicillins; Klor-con [potassium chloride]; and Cozaar [losartan potassium]   Review of Systems Review of Systems Ten systems reviewed and are negative for acute change, except as noted in the HPI.    Physical Exam Updated Vital Signs BP 110/87   Pulse 63   Temp 98 F (36.7 C) (Oral)   Resp 16   Ht 5\' 4"  (1.626 m)   Wt 73 kg (161 lb)   SpO2 98%   BMI 27.64 kg/m   Physical Exam  Constitutional: She is oriented to person, place, and time. She appears well-developed and well-nourished. No distress.  Nontoxic and in NAD  HENT:  Head: Normocephalic.  2cm laceration to occipital scalp. No battle's sign or raccoon's eyes. No skull instability.  Eyes: Conjunctivae and EOM are normal. No scleral icterus.  Neck: Normal range of motion.  Normal ROM  Cardiovascular: Normal rate, regular rhythm and intact distal pulses.   Pulmonary/Chest: Effort normal. No respiratory distress.  Respirations even and unlabored  Musculoskeletal: Normal range of motion.  Neurological: She is alert and oriented to person, place, and time. She exhibits normal muscle tone. Coordination normal.  GCS 15. Speech is goal oriented. No focal deficits noted. Patient moving all extremities.  Skin: Skin is warm and dry. No rash noted. She is not diaphoretic. No erythema. No pallor.  Psychiatric: She has a normal mood and affect. Her behavior is normal.  Nursing note and vitals reviewed.    ED Treatments / Results  Labs (all labs ordered are listed, but only abnormal results are displayed) Labs Reviewed - No data to display  EKG  EKG Interpretation None       Radiology Ct Head Wo Contrast  Result Date: 10/18/2016 CLINICAL DATA:  Fall, head trauma EXAM: CT HEAD WITHOUT CONTRAST TECHNIQUE: Contiguous axial images were obtained from the base of the skull through the vertex without intravenous contrast. COMPARISON:  10/10/2016, 10/07/2016 FINDINGS: Brain: No acute territorial  infarction or hemorrhage is seen. Moderate to marked small vessel ischemic changes of the white matter. Old small left inferior frontal lobe infarct. Moderate atrophy. Stable ventricle size. Vascular: No hyperdense vessels.  Carotid artery calcifications. Skull: No depressed skull fracture. Sinuses/Orbits: No acute finding.  Bilateral lens extraction Other: Small left inferior forehead hematoma. IMPRESSION: No CT evidence for acute intracranial abnormality. Atrophy and small vessel ischemic changes of the white matter Electronically Signed   By: Jasmine Pang M.D.   On: 10/18/2016 00:20    Procedures Procedures (including critical care time)  Medications Ordered in ED Medications - No data to display   LACERATION REPAIR Performed by: Antony Madura Authorized by: Antony Madura Consent: Verbal consent obtained. Risks and benefits: risks, benefits and alternatives were discussed Consent given by: patient Patient identity confirmed: provided demographic data Prepped and Draped in normal sterile fashion Wound explored  Laceration Location: occipital scalp  Laceration Length: 2cm  No Foreign Bodies seen or palpated  Irrigation method: syringe Amount of cleaning: standard  Skin closure: staples  Number of sutures: 2  Technique: simple  Patient tolerance: Patient tolerated the procedure well with no immediate complications.   Initial Impression / Assessment and Plan / ED Course  I have reviewed the triage vital signs and the nursing notes.  Pertinent labs & imaging results that were available during my care of the patient were reviewed by me and considered in my medical decision making (see chart for details).     83 year old female presents to the emergency department after mechanical fall. She sustained a laceration to her occipital scalp. Patient denies LOC, N/V. No c/o headache. She states her fall was due to loss of balance which is an issue for her at baseline. She notes that  she is supposed to use a wheelchair, but neglected to use this tonight. Laceration closed with staples without complications. Patient has no focal neurologic deficits. Her head CT is negative for acute traumatic injury. I do not believe further emergent workup is indicated. Patient with no unaddressed concerns. Will transport back home via [facility].   Final Clinical Impressions(s) / ED Diagnoses   Final diagnoses:  Fall, initial encounter  Laceration of scalp, initial encounter    New Prescriptions New Prescriptions   No medications on file     Antony Madura, Cordelia Poche 10/18/16 1610

## 2016-10-17 NOTE — ED Triage Notes (Signed)
Per EMS, pt. From [facility] with complaint of fall at 0930 this evening, pt. Got up from chair  to water her plants inside her room and lost balance, fell backward and hit her head on the leg table ,laceration with bleeding at the back of head reported, bleeding controlled, pt. Denied LOC ,admitted that she had frequent falls in the last 3 months due to lost balance, denied dizziness nor headache. Alert and oriented x3.

## 2016-10-17 NOTE — ED Notes (Signed)
Bed: WA09 Expected date:  Expected time:  Means of arrival:  Comments: 79f fall

## 2016-10-18 ENCOUNTER — Emergency Department (HOSPITAL_COMMUNITY): Payer: Medicare HMO

## 2016-10-18 DIAGNOSIS — S0990XA Unspecified injury of head, initial encounter: Secondary | ICD-10-CM | POA: Diagnosis not present

## 2016-10-18 NOTE — Discharge Instructions (Signed)
Follow-up with your primary care doctor in 1 week for removal of your staples. Use your wheelchair as instructed to prevent falls. You may take Tylenol as needed for headache. You may return to the ED for new or concerning symptoms.

## 2016-10-18 NOTE — ED Provider Notes (Addendum)
S/p mechanical fall.  No signs of serious injury.  Laceration repaired by PA [facility]  Medical screening examination/treatment/procedure(s) were conducted as a shared visit with non-physician practitioner(s) and myself.  I personally evaluated the patient during the encounter.      Linwood Dibbles, MD 10/18/16 [phone]

## 2016-10-18 NOTE — ED Notes (Signed)
MD made aware of pt.'s latest BP, pt. Stated that she will take her BP meds once home, missed a dose this evening while here in ED , MD ok for pt. To go home.

## 2016-10-19 DIAGNOSIS — S02401D Maxillary fracture, unspecified, subsequent encounter for fracture with routine healing: Secondary | ICD-10-CM | POA: Diagnosis not present

## 2016-10-19 DIAGNOSIS — M1990 Unspecified osteoarthritis, unspecified site: Secondary | ICD-10-CM | POA: Diagnosis not present

## 2016-10-19 DIAGNOSIS — I69320 Aphasia following cerebral infarction: Secondary | ICD-10-CM | POA: Diagnosis not present

## 2016-10-19 DIAGNOSIS — S51812D Laceration without foreign body of left forearm, subsequent encounter: Secondary | ICD-10-CM | POA: Diagnosis not present

## 2016-10-19 DIAGNOSIS — R69 Illness, unspecified: Secondary | ICD-10-CM | POA: Diagnosis not present

## 2016-10-19 DIAGNOSIS — I10 Essential (primary) hypertension: Secondary | ICD-10-CM | POA: Diagnosis not present

## 2016-10-20 ENCOUNTER — Inpatient Hospital Stay (HOSPITAL_COMMUNITY)
Admission: EM | Admit: 2016-10-20 | Discharge: 2016-10-24 | DRG: 689 | Disposition: A | Discharge status: Skilled Nursing Facility | Payer: Medicare HMO | Attending: Student in an Organized Health Care Education/Training Program | Admitting: Student in an Organized Health Care Education/Training Program

## 2016-10-20 ENCOUNTER — Emergency Department (HOSPITAL_COMMUNITY): Payer: Medicare HMO

## 2016-10-20 ENCOUNTER — Encounter (HOSPITAL_COMMUNITY): Payer: Self-pay | Admitting: Emergency Medicine

## 2016-10-20 DIAGNOSIS — R4701 Aphasia: Secondary | ICD-10-CM | POA: Diagnosis present

## 2016-10-20 DIAGNOSIS — I615 Nontraumatic intracerebral hemorrhage, intraventricular: Secondary | ICD-10-CM

## 2016-10-20 DIAGNOSIS — I480 Paroxysmal atrial fibrillation: Secondary | ICD-10-CM | POA: Diagnosis present

## 2016-10-20 DIAGNOSIS — J90 Pleural effusion, not elsewhere classified: Secondary | ICD-10-CM | POA: Diagnosis not present

## 2016-10-20 DIAGNOSIS — R2681 Unsteadiness on feet: Secondary | ICD-10-CM | POA: Diagnosis not present

## 2016-10-20 DIAGNOSIS — Z79899 Other long term (current) drug therapy: Secondary | ICD-10-CM | POA: Diagnosis not present

## 2016-10-20 DIAGNOSIS — F329 Major depressive disorder, single episode, unspecified: Secondary | ICD-10-CM | POA: Diagnosis present

## 2016-10-20 DIAGNOSIS — R079 Chest pain, unspecified: Secondary | ICD-10-CM | POA: Diagnosis not present

## 2016-10-20 DIAGNOSIS — I160 Hypertensive urgency: Secondary | ICD-10-CM | POA: Diagnosis present

## 2016-10-20 DIAGNOSIS — G629 Polyneuropathy, unspecified: Secondary | ICD-10-CM | POA: Diagnosis present

## 2016-10-20 DIAGNOSIS — R404 Transient alteration of awareness: Secondary | ICD-10-CM | POA: Diagnosis not present

## 2016-10-20 DIAGNOSIS — Z882 Allergy status to sulfonamides status: Secondary | ICD-10-CM

## 2016-10-20 DIAGNOSIS — R69 Illness, unspecified: Secondary | ICD-10-CM | POA: Diagnosis not present

## 2016-10-20 DIAGNOSIS — I10 Essential (primary) hypertension: Secondary | ICD-10-CM | POA: Diagnosis not present

## 2016-10-20 DIAGNOSIS — R296 Repeated falls: Secondary | ICD-10-CM | POA: Diagnosis present

## 2016-10-20 DIAGNOSIS — I69311 Memory deficit following cerebral infarction: Secondary | ICD-10-CM | POA: Diagnosis not present

## 2016-10-20 DIAGNOSIS — E039 Hypothyroidism, unspecified: Secondary | ICD-10-CM | POA: Diagnosis present

## 2016-10-20 DIAGNOSIS — Z7982 Long term (current) use of aspirin: Secondary | ICD-10-CM | POA: Diagnosis not present

## 2016-10-20 DIAGNOSIS — E785 Hyperlipidemia, unspecified: Secondary | ICD-10-CM | POA: Diagnosis present

## 2016-10-20 DIAGNOSIS — G9341 Metabolic encephalopathy: Secondary | ICD-10-CM | POA: Diagnosis present

## 2016-10-20 DIAGNOSIS — N3010 Interstitial cystitis (chronic) without hematuria: Secondary | ICD-10-CM | POA: Diagnosis not present

## 2016-10-20 DIAGNOSIS — R269 Unspecified abnormalities of gait and mobility: Secondary | ICD-10-CM | POA: Diagnosis not present

## 2016-10-20 DIAGNOSIS — Z66 Do not resuscitate: Secondary | ICD-10-CM | POA: Diagnosis present

## 2016-10-20 DIAGNOSIS — F419 Anxiety disorder, unspecified: Secondary | ICD-10-CM | POA: Diagnosis present

## 2016-10-20 DIAGNOSIS — B952 Enterococcus as the cause of diseases classified elsewhere: Secondary | ICD-10-CM | POA: Diagnosis present

## 2016-10-20 DIAGNOSIS — R2689 Other abnormalities of gait and mobility: Secondary | ICD-10-CM | POA: Diagnosis not present

## 2016-10-20 DIAGNOSIS — N390 Urinary tract infection, site not specified: Secondary | ICD-10-CM | POA: Diagnosis not present

## 2016-10-20 DIAGNOSIS — Z9181 History of falling: Secondary | ICD-10-CM | POA: Diagnosis not present

## 2016-10-20 DIAGNOSIS — M6281 Muscle weakness (generalized): Secondary | ICD-10-CM | POA: Diagnosis not present

## 2016-10-20 DIAGNOSIS — I629 Nontraumatic intracranial hemorrhage, unspecified: Secondary | ICD-10-CM

## 2016-10-20 DIAGNOSIS — E538 Deficiency of other specified B group vitamins: Secondary | ICD-10-CM | POA: Diagnosis present

## 2016-10-20 DIAGNOSIS — K219 Gastro-esophageal reflux disease without esophagitis: Secondary | ICD-10-CM | POA: Diagnosis present

## 2016-10-20 DIAGNOSIS — N3000 Acute cystitis without hematuria: Secondary | ICD-10-CM | POA: Diagnosis present

## 2016-10-20 DIAGNOSIS — Z5189 Encounter for other specified aftercare: Secondary | ICD-10-CM | POA: Diagnosis not present

## 2016-10-20 DIAGNOSIS — Z993 Dependence on wheelchair: Secondary | ICD-10-CM

## 2016-10-20 DIAGNOSIS — F0150 Vascular dementia without behavioral disturbance: Secondary | ICD-10-CM | POA: Diagnosis present

## 2016-10-20 DIAGNOSIS — Z888 Allergy status to other drugs, medicaments and biological substances status: Secondary | ICD-10-CM | POA: Diagnosis not present

## 2016-10-20 DIAGNOSIS — R531 Weakness: Secondary | ICD-10-CM

## 2016-10-20 DIAGNOSIS — Z880 Allergy status to penicillin: Secondary | ICD-10-CM | POA: Diagnosis not present

## 2016-10-20 DIAGNOSIS — R4182 Altered mental status, unspecified: Secondary | ICD-10-CM | POA: Diagnosis not present

## 2016-10-20 DIAGNOSIS — I638 Other cerebral infarction: Secondary | ICD-10-CM | POA: Diagnosis not present

## 2016-10-20 DIAGNOSIS — G464 Cerebellar stroke syndrome: Secondary | ICD-10-CM | POA: Diagnosis not present

## 2016-10-20 LAB — CBC WITH DIFFERENTIAL/PLATELET
BASOS PCT: 0 %
Basophils Absolute: 0 10*3/uL (ref 0.0–0.1)
EOS ABS: 0.1 10*3/uL (ref 0.0–0.7)
Eosinophils Relative: 1 %
HEMATOCRIT: 38.8 % (ref 36.0–46.0)
Hemoglobin: 12.7 g/dL (ref 12.0–15.0)
LYMPHS ABS: 2.4 10*3/uL (ref 0.7–4.0)
Lymphocytes Relative: 22 %
MCH: 28.5 pg (ref 26.0–34.0)
MCHC: 32.7 g/dL (ref 30.0–36.0)
MCV: 87.2 fL (ref 78.0–100.0)
MONOS PCT: 7 %
Monocytes Absolute: 0.8 10*3/uL (ref 0.1–1.0)
NEUTROS ABS: 7.7 10*3/uL (ref 1.7–7.7)
Neutrophils Relative %: 70 %
Platelets: 300 10*3/uL (ref 150–400)
RBC: 4.45 MIL/uL (ref 3.87–5.11)
RDW: 13.9 % (ref 11.5–15.5)
WBC: 10.9 10*3/uL — AB (ref 4.0–10.5)

## 2016-10-20 LAB — COMPREHENSIVE METABOLIC PANEL
ALT: 16 U/L (ref 14–54)
ANION GAP: 11 (ref 5–15)
AST: 26 U/L (ref 15–41)
Albumin: 3.8 g/dL (ref 3.5–5.0)
Alkaline Phosphatase: 94 U/L (ref 38–126)
BUN: 8 mg/dL (ref 6–20)
CHLORIDE: 94 mmol/L — AB (ref 101–111)
CO2: 27 mmol/L (ref 22–32)
CREATININE: 0.75 mg/dL (ref 0.44–1.00)
Calcium: 9.4 mg/dL (ref 8.9–10.3)
Glucose, Bld: 114 mg/dL — ABNORMAL HIGH (ref 65–99)
Potassium: 3.9 mmol/L (ref 3.5–5.1)
SODIUM: 132 mmol/L — AB (ref 135–145)
Total Bilirubin: 1 mg/dL (ref 0.3–1.2)
Total Protein: 7.1 g/dL (ref 6.5–8.1)

## 2016-10-20 LAB — CBG MONITORING, ED: GLUCOSE-CAPILLARY: 102 mg/dL — AB (ref 65–99)

## 2016-10-20 LAB — PROTIME-INR
INR: 0.99
PROTHROMBIN TIME: 13 s (ref 11.4–15.2)

## 2016-10-20 LAB — URINALYSIS, ROUTINE W REFLEX MICROSCOPIC
BILIRUBIN URINE: NEGATIVE
Glucose, UA: NEGATIVE mg/dL
HGB URINE DIPSTICK: NEGATIVE
KETONES UR: 20 mg/dL — AB
Nitrite: NEGATIVE
PROTEIN: 100 mg/dL — AB
Specific Gravity, Urine: 1.018 (ref 1.005–1.030)
pH: 6 (ref 5.0–8.0)

## 2016-10-20 LAB — RAPID URINE DRUG SCREEN, HOSP PERFORMED
Amphetamines: NOT DETECTED
BENZODIAZEPINES: NOT DETECTED
Barbiturates: NOT DETECTED
COCAINE: NOT DETECTED
OPIATES: NOT DETECTED
Tetrahydrocannabinol: NOT DETECTED

## 2016-10-20 LAB — APTT: aPTT: 31 seconds (ref 24–36)

## 2016-10-20 MED ORDER — DEXTROSE 5 % IV SOLN
1.0000 g | Freq: Once | INTRAVENOUS | Status: AC
  Administered 2016-10-20: 1 g via INTRAVENOUS
  Filled 2016-10-20: qty 10

## 2016-10-20 MED ORDER — LABETALOL HCL 5 MG/ML IV SOLN
10.0000 mg | Freq: Once | INTRAVENOUS | Status: AC
  Administered 2016-10-20: 10 mg via INTRAVENOUS
  Filled 2016-10-20: qty 4

## 2016-10-20 MED ORDER — SODIUM CHLORIDE 0.9 % IV SOLN
INTRAVENOUS | Status: AC
  Administered 2016-10-20 – 2016-10-21 (×2): via INTRAVENOUS

## 2016-10-20 MED ORDER — LABETALOL HCL 5 MG/ML IV SOLN
5.0000 mg | INTRAVENOUS | Status: DC | PRN
  Administered 2016-10-21 (×2): 5 mg via INTRAVENOUS
  Filled 2016-10-20 (×2): qty 4

## 2016-10-20 NOTE — ED Provider Notes (Signed)
[facility] DEPT Provider Note   CSN: [id number] Arrival date & time: 10/20/16  1034     History   Chief Complaint Chief Complaint  Patient presents with  . Altered Mental Status    HPI Ebony [name] is a 83 y.o. female.  Pt presents to the ED today with right sided weakness.  The pt is from snf and apparently this was noticed sometime during the night.  Pt is unable to give any hx.  She either will not or cannot speak when she is asked questions.      Past Medical History:  Diagnosis Date  . Acute ischemic stroke (HCC) 04/11/2012  . Allergic rhinitis   . Anxiety   . Arthritis    Knees  . Dementia   . Depression   . Fibrocystic breast disease   . Hiatal hernia   . Hyperlipidemia   . Hypertension   . IFG (impaired fasting glucose)   . SDH (subdural hematoma) (HCC) 05/16/2011  . Subarachnoid hemorrhage following injury (HCC) 05/16/2011  . Subarachnoid hemorrhage following injury (HCC) 05/16/2011  . Unspecified hypothyroidism 04/15/2012    Patient Active Problem List   Diagnosis Date Noted  . Intraventricular hemorrhage (HCC)   . History of recent fall   . Vascular dementia, uncomplicated   . Acute cystitis without hematuria   . Intracranial hemorrhage (HCC) 10/20/2016  . Hyponatremia 10/08/2016  . Hypertensive urgency 10/08/2016  . Polypharmacy   . Paroxysmal supraventricular tachycardia (HCC)   . Recurrent falls 09/04/2016  . Maxillary sinus fracture (HCC) 09/04/2016  . Closed fracture of nasal bone 09/04/2016  . Acute encephalopathy   . Expressive aphasia   . History of TIA (transient ischemic attack) and stroke 06/24/2016  . Depression with anxiety 06/24/2016  . Hypothyroid 06/24/2016  . GERD (gastroesophageal reflux disease) 06/24/2016  . Allergic rhinitis 06/08/2014  . DDD (degenerative disc disease), lumbar 06/08/2014  . Insomnia 12/07/2013  . Depression   . OA (osteoarthritis) 01/06/2013  . Loss of weight 07/13/2012  . Vascular dementia  without behavioral disturbance 06/20/2012  . Hypothyroidism due to acquired atrophy of thyroid 04/15/2012  . Essential hypertension 05/16/2011  . Anxiety disorder 05/16/2011    Past Surgical History:  Procedure Laterality Date  . ABDOMINAL HYSTERECTOMY    . BREAST SURGERY    . EYE SURGERY    . TONSILLECTOMY      OB History    No data available       Home Medications    Prior to Admission medications   Medication Sig Start Date End Date Taking? Authorizing Provider  acetaminophen (TYLENOL) 500 MG tablet Take 500 mg by mouth 2 (two) times daily.   Yes [provider]  aspirin 81 MG chewable tablet Chew 81 mg by mouth daily.   Yes [provider]  Dextran 70-Hypromellose (ARTIFICIAL TEARS) 0.1-0.3 % SOLN Place 1 drop into both eyes every morning.    Yes [provider]  fluticasone (FLONASE) 50 MCG/ACT nasal spray Place 2 sprays into both nostrils daily. 06/08/14  Yes Sharon Seller, NP  ibuprofen (ADVIL,MOTRIN) 200 MG tablet Take 200 mg by mouth every 12 (twelve) hours as needed for moderate pain.   Yes [provider]  levothyroxine (SYNTHROID, LEVOTHROID) 25 MCG tablet Take 1 tablet (25 mcg total) by mouth daily before breakfast. Patient taking differently: Take 25 mcg by mouth at bedtime.  04/15/12  Yes Christiane Ha, MD  lisinopril (PRINIVIL,ZESTRIL) 20 MG tablet Take 40 mg by mouth daily.  10/01/16  Yes [provider]  magnesium hydroxide (MILK OF MAGNESIA) 400 MG/5ML suspension Take 30 mLs by mouth daily as needed for mild constipation.   Yes [provider]  Melatonin 3 MG CAPS By mouth qhs for sleep Patient taking differently: Take 3 mg by mouth at bedtime.  11/17/13  Yes Sharon Seller, NP  Menthol, Topical Analgesic, (BIOFREEZE) 4 % GEL Apply 1 application topically every 8 (eight) hours as needed (knee pain).   Yes [provider]  metoprolol (TOPROL-XL) 100 MG 24 hr tablet Take 150 mg by mouth every  morning. for HTN.   Yes [provider]  Multiple Vitamins-Minerals (THERA-M PO) Take 1 tablet by mouth every morning.   Yes [provider]  pantoprazole (PROTONIX) 20 MG tablet Take 20 mg by mouth at bedtime.    Yes [provider]  Psyllium 500 MG CAPS Take 500 mg by mouth at bedtime.   Yes [provider]  saccharomyces boulardii (FLORASTOR) 250 MG capsule Take 250 mg by mouth 2 (two) times daily.   Yes [provider]  senna (SENOKOT) 8.6 MG TABS tablet Take 1 tablet by mouth 3 (three) times a week. Mon. Wed. And Friday for constipation    Yes [provider]    Family History Family History  Problem Relation Age of Onset  . Lung cancer Sister   . CVA Neg Hx   . Diabetes Neg Hx   . Hypertension Neg Hx     Social History Social History  Substance Use Topics  . Smoking status: Never Smoker  . Smokeless tobacco: Never Used  . Alcohol use No     Allergies   Sulfa antibiotics; Ace inhibitors; Biaxin [clarithromycin]; Penicillins; Klor-con [potassium chloride]; and Cozaar [losartan potassium]   Review of Systems Review of Systems  Unable to perform ROS: Patient nonverbal     Physical Exam Updated Vital Signs BP (!) 181/93 (BP Location: Right Arm)   Pulse 83   Temp 99.7 F (37.6 C) (Axillary)   Resp 18   SpO2 94%   Physical Exam  Constitutional: She appears well-developed and well-nourished.  HENT:  Head: Normocephalic and atraumatic.  Right Ear: External ear normal.  Left Ear: External ear normal.  Nose: Nose normal.  Mouth/Throat: Oropharynx is clear and moist.  Eyes: Pupils are equal, round, and reactive to light. Conjunctivae and EOM are normal.  Neck: Normal range of motion. Neck supple.  Cardiovascular: Normal rate, regular rhythm, normal heart sounds and intact distal pulses.   Pulmonary/Chest: Effort normal and breath sounds normal.  Abdominal: Soft. Bowel sounds are normal.  Musculoskeletal: Normal  range of motion.  Neurological: She is alert.  Pt will intermittently follow commands on her left, but there is no movement on her right.  Skin: Skin is warm. Capillary refill takes less than 2 seconds.  Nursing note and vitals reviewed.    ED Treatments / Results  Labs (all labs ordered are listed, but only abnormal results are displayed) Labs Reviewed  COMPREHENSIVE METABOLIC PANEL - Abnormal; Notable for the following:       Result Value   Sodium 132 (*)    Chloride 94 (*)    Glucose, Bld 114 (*)    All other components within normal limits  CBC WITH DIFFERENTIAL/PLATELET - Abnormal; Notable for the following:    WBC 10.9 (*)    All other components within normal limits  URINALYSIS, ROUTINE W REFLEX MICROSCOPIC - Abnormal; Notable for the following:  Color, Urine AMBER (*)    APPearance CLOUDY (*)    Ketones, ur 20 (*)    Protein, ur 100 (*)    Leukocytes, UA LARGE (*)    Bacteria, UA FEW (*)    Squamous Epithelial / LPF 0-5 (*)    All other components within normal limits  BASIC METABOLIC PANEL - Abnormal; Notable for the following:    Sodium 133 (*)    Chloride 97 (*)    Glucose, Bld 101 (*)    All other components within normal limits  CBG MONITORING, ED - Abnormal; Notable for the following:    Glucose-Capillary 102 (*)    All other components within normal limits  URINE CULTURE  PROTIME-INR  APTT  RAPID URINE DRUG SCREEN, HOSP PERFORMED    EKG  EKG Interpretation  Date/Time:  Tuesday October 20 2016 13:03:28 EDT Ventricular Rate:  76 PR Interval:  208 QRS Duration: 82 QT Interval:  414 QTC Calculation: 465 R Axis:   44 Text Interpretation:  Sinus rhythm with Premature atrial complexes ST & T wave abnormality, consider inferior ischemia Abnormal ECG Confirmed by Jacalyn Lefevre [phone]) on 10/20/2016 1:06:52 PM       Radiology Procedures Procedures (including critical care time)  Medications Ordered in ED Medications  ibuprofen (ADVIL,MOTRIN)  tablet 200 mg (not administered)  lisinopril (PRINIVIL,ZESTRIL) tablet 40 mg (40 mg Oral Not Given 10/21/16 1000)  psyllium (HYDROCIL/METAMUCIL) packet 1 packet (not administered)  senna (SENOKOT) tablet 8.6 mg (8.6 mg Oral Not Given 10/21/16 0900)  polyvinyl alcohol (LIQUIFILM TEARS) 1.4 % ophthalmic solution 1 drop (1 drop Both Eyes Given 10/21/16 1023)  pantoprazole (PROTONIX) EC tablet 20 mg (20 mg Oral Not Given 10/21/16 0115)  fluticasone (FLONASE) 50 MCG/ACT nasal spray 2 spray (2 sprays Each Nare Not Given 10/21/16 1023)  aspirin chewable tablet 81 mg (81 mg Oral Not Given 10/21/16 1000)  metoprolol succinate (TOPROL-XL) 24 hr tablet 150 mg (150 mg Oral Not Given 10/21/16 1000)  0.9 %  sodium chloride infusion ( Intravenous Stopped 10/21/16 0919)  acetaminophen (TYLENOL) tablet 650 mg (not administered)    Or  acetaminophen (TYLENOL) suppository 650 mg (not administered)  polyethylene glycol (MIRALAX / GLYCOLAX) packet 17 g (not administered)  labetalol (NORMODYNE,TRANDATE) injection 10 mg (10 mg Intravenous Given 10/21/16 1441)  cefTRIAXone (ROCEPHIN) 1 g in dextrose 5 % 50 mL IVPB (not administered)  0.9 %  sodium chloride infusion ( Intravenous New Bag/Given 10/21/16 1436)  levothyroxine (SYNTHROID, LEVOTHROID) injection 12.5 mcg (not administered)  cefTRIAXone (ROCEPHIN) 1 g in dextrose 5 % 50 mL IVPB (0 g Intravenous Stopped 10/20/16 2045)  labetalol (NORMODYNE,TRANDATE) injection 10 mg (10 mg Intravenous Given 10/20/16 2133)     Initial Impression / Assessment and Plan / ED Course  I have reviewed the triage vital signs and the nursing notes.  Pertinent labs & imaging results that were available during my care of the patient were reviewed by me and considered in my medical decision making (see chart for details).    MRi done which showed a possible dural vein thrombosis.  Radiology recommended mrv.  I spoke with neurology.  They felt that as long as mri did not show a stroke, they did  not need to be involved.  Pt signed out to Dr. Clarene Duke at shift change.  UA and MRV pending at shift change.  Pt d/w her daughters.  MS is improving.  Pt will need admission.  Final Clinical Impressions(s) / ED Diagnoses   Final  diagnoses:  Intraventricular hemorrhage (HCC)  History of recent fall  Right sided weakness  Hypertension, unspecified type    New Prescriptions Current Discharge Medication List       Jacalyn Lefevre, MD 10/21/16 [phone]

## 2016-10-20 NOTE — ED Notes (Signed)
Admitting @ bedside

## 2016-10-20 NOTE — ED Notes (Signed)
RN notified of high BP 

## 2016-10-20 NOTE — ED Notes (Signed)
Pt returned from MRI °

## 2016-10-20 NOTE — ED Notes (Signed)
Pt being taken to MRI. Approval of MD with BP and VS

## 2016-10-20 NOTE — ED Notes (Signed)
Pt taken to MRI  

## 2016-10-20 NOTE — ED Triage Notes (Signed)
Pt here from nursing with c/o new right side weakness , lsn was sometime on  Third shift at the nursing home , cbg 119,

## 2016-10-20 NOTE — ED Provider Notes (Signed)
I received pt in signout from Dr. Particia Nearing. She had presented with right-sided weakness, unclear time of onset. We were awaiting MRI results and UA. UA shows large leukocytes and WBCs. Added urine culture and gave the patient ceftriaxone to cover for urinary tract infection.gave the patient a dose of IV labetalol for hypertension.  MRI showed intraventricular hemorrhage, possible poor flow signal suggestive of sinus thrombosis. Obtained MRV. MRV was negative for venous sinus thrombosis. I contacted neurologist, Dr. Otelia Limes, who reviewed these images as well as CT images from a few days ago when the patient presented after a fall. He feels that the CT from a few days ago actually show the same area of hemorrhage. He suspects that they are posttraumatic hemorrhae rather than spontaneous. He recommended contacting neurosurgery. I discussed with neurosurgeon Dr. Franky Macho. He stated there was no acute neurosurgical intervention required and to manage the patient as we normally would for blood pressure control. I discussed with internal medicine teaching service and patient admitted for further treatment.  CRITICAL CARE Performed by: Ambrose Finland Little   Total critical care time: 45 minutes  Critical care time was exclusive of separately billable procedures and treating other patients.  Critical care was necessary to treat or prevent imminent or life-threatening deterioration.  Critical care was time spent personally by me on the following activities: development of treatment plan with patient and/or surrogate as well as nursing, discussions with consultants, evaluation of patient's response to treatment, examination of patient, obtaining history from patient or surrogate, ordering and performing treatments and interventions, ordering and review of laboratory studies, ordering and review of radiographic studies, pulse oximetry and re-evaluation of patient's condition.    Little, Ambrose Finland,  MD 10/20/16 2211

## 2016-10-20 NOTE — H&P (Signed)
Date: 10/21/2016               [name] Name:  Ebony [name] MRN: [id number]  DOB: [birth year] Age / Sex: 83 y.o., female   PCP: Florentina Jenny, MD         Medical Service: Internal Medicine Teaching Service         Attending Physician: Dr. Doneen Poisson, MD    First Contact: Dr. Saunders Revel Pager: [phone]  Second Contact: Dr. Karma Greaser Pager: [phone]       After Hours (After 5p/  First Contact Pager: [phone]  weekends / holidays): Second Contact Pager: [phone]   Chief Complaint: Altered Mental Status, Right sided weakness  History of Present Illness: Ebony [name] is an [name] with history of TIA/CVA, Vascular dementia, Hypertension, Paroxysmal Afib (not on blood thinners), GERD, Hypothyroidism and Recurrent falls presenting with Altered mental status and apparent R sided weakness. [name] is a poor historian, history obtained from family and chart review. [name] was first noticed to be not acting like her normal self by staff at her facility [facility] evening prior to admission. Ebony [name] was noted to be staring off into space for periods of time. Ebony [name] was made comfortable and reevaluated in [facility] morning at which point Ebony [name] was less responsive. [facility] facility physician saw her today and advised Ebony [name] be sent to [facility] ED for further evaluation. Ebony [name] is talkative at her basline and able to participate in activities although, primarily wheelchair bound. On interview Ebony [name] has slurred speech, only occasionally provides one or two word answers, and is not cooperative with examination.  Of note, [name] was recently discharged on 10/09/16 following admission for altered mental status 2/2 polypharmacy. Workup during that admission was negative for stroke and Ebony [name] was discharged with medication changes that have been implemented (upon review of facility [facility] [name] has also been to [facility] ED twice since discharge for falls.   In [facility] ED, Ebony [name] was hypertensive to [facility] 200s/100s, CMP was without significant  abnormality, PT-INR and PTT were WNL, UDS negative, and CBC showed WBC 10.9. Urinalysis was cloudy with TNTC WBC, few bacteria, and no nitrites. Ur Cx was obtained. EKG showed sinus rhythm with PACs and nonspecific ST changes (consistent with prior). CXR showed mild L basilar infiltrate and small L pleural effusion. CT Head showed stable chronic changes with low-attenuation of L insular ribbon of questionable significance. MRI Brain showed Small Volume of hemorrhage in occipital horns of lateral ventricles, and stable chronic changes, and MRV recommended to evaluate for possible sinus thrombosis. MRV HEAD showed no sinus thrombosis. Neurology was consulted and suspected that [facility] small bleed on imaging was posttraumatic hemorrhae rather than spontaneous and recommended neurosurgery consult who stated that there was no acute neurosurgical intervention required and to manage Ebony [name] as one normally would for blood pressure control. Ebony [name] received labetalol 10mg  IV and Ceftriaxone 1g IV. Her blood pressure improved to 160s/90s with [facility] labetalol, but came back up to 180s/90s by [facility] time of interview. [name] to be admitted for further workup and treatment.   Meds:  Current Meds  Medication Sig  . acetaminophen (TYLENOL) 500 MG tablet Take 500 mg by mouth 2 (two) times daily.  Marland Kitchen aspirin 81 MG chewable tablet Chew 81 mg by mouth daily.  Marland Kitchen Dextran 70-Hypromellose (ARTIFICIAL TEARS) 0.1-0.3 % SOLN Place 1 drop into both eyes every morning.   . fluticasone (FLONASE) 50 MCG/ACT nasal spray Place 2 sprays into both nostrils daily.  Marland Kitchen  ibuprofen (ADVIL,MOTRIN) 200 MG tablet Take 200 mg by mouth every 12 (twelve) hours as needed for moderate pain.  Marland Kitchen levothyroxine (SYNTHROID, LEVOTHROID) 25 MCG tablet Take 1 tablet (25 mcg total) by mouth daily before breakfast. ([name] taking differently: Take 25 mcg by mouth at bedtime. )  . lisinopril (PRINIVIL,ZESTRIL) 20 MG tablet Take 40 mg by mouth daily.   . magnesium  hydroxide (MILK OF MAGNESIA) 400 MG/5ML suspension Take 30 mLs by mouth daily as needed for mild constipation.  . Melatonin 3 MG CAPS By mouth qhs for sleep ([name] taking differently: Take 3 mg by mouth at bedtime. )  . Menthol, Topical Analgesic, (BIOFREEZE) 4 % GEL Apply 1 application topically every 8 (eight) hours as needed (knee pain).  . metoprolol (TOPROL-XL) 100 MG 24 hr tablet Take 150 mg by mouth every morning. for HTN.  . Multiple Vitamins-Minerals (THERA-M PO) Take 1 tablet by mouth every morning.  . pantoprazole (PROTONIX) 20 MG tablet Take 20 mg by mouth at bedtime.   . Psyllium 500 MG CAPS Take 500 mg by mouth at bedtime.  . saccharomyces boulardii (FLORASTOR) 250 MG capsule Take 250 mg by mouth 2 (two) times daily.  Marland Kitchen senna (SENOKOT) 8.6 MG TABS tablet Take 1 tablet by mouth 3 (three) times a week. Mon. Wed. And Friday for constipation      Allergies: Allergies as of 10/20/2016 - Review Complete 10/20/2016  Allergen Reaction Noted  . Sulfa antibiotics Diarrhea 12/25/2010  . Ace inhibitors  08/24/2012  . Biaxin [clarithromycin] Diarrhea 08/24/2012  . Penicillins Diarrhea 12/25/2010  . Klor-con [potassium chloride] Other (See Comments) 08/31/2016  . Cozaar [losartan potassium] Rash 08/24/2012   Past Medical History:  Diagnosis Date  . Acute ischemic stroke (HCC) 04/11/2012  . Allergic rhinitis   . Anxiety   . Arthritis    Knees  . Dementia   . Depression   . Fibrocystic breast disease   . Hiatal hernia   . Hyperlipidemia   . Hypertension   . IFG (impaired fasting glucose)   . SDH (subdural hematoma) (HCC) 05/16/2011  . Subarachnoid hemorrhage following injury (HCC) 05/16/2011  . Subarachnoid hemorrhage following injury (HCC) 05/16/2011  . Unspecified hypothyroidism 04/15/2012    Family History:  Family History  Problem Relation Age of Onset  . Lung cancer Sister   . CVA Neg Hx   . Diabetes Neg Hx   . Hypertension Neg Hx    Social History:  Social History    Substance Use Topics  . Smoking status: Never Smoker  . Smokeless tobacco: Never Used  . Alcohol use No   Review of Systems: A complete ROS was unable to be perform due to [name] altered mental status  Physical Exam: Blood pressure (!) 188/96, pulse 85, temperature 98.6 F (37 C), temperature source Axillary, resp. rate (!) 24, SpO2 94 %. Physical Exam  Constitutional: [facility] [name] appears well-developed. No distress.  Thin elderly female  HENT:  Head: Normocephalic.  Nose: Nose normal.  Mouth/Throat: Oropharynx is clear and moist.  Eyes: EOM are normal. Right eye exhibits no discharge. Left eye exhibits no discharge.  Cardiovascular: Normal rate, regular rhythm and normal heart sounds.   Pulmonary/Chest: Effort normal and breath sounds normal. No respiratory distress.  Abdominal: Soft. Bowel sounds are normal. Ebony [name] exhibits no distension. There is no tenderness.  Musculoskeletal: Ebony [name] exhibits no edema or deformity.  Neurological: Ebony [name] is alert.  Disoriented Unable to perform neurological exam due to AMS  Skin: Skin is warm and dry.  EKG: personally reviewed my interpretation is Sinus rhythm with PACs and non specific ST & T changes, similar to previous.  CXR: personally reviewed my interpretation is L basilar infiltrate, small left pleural effusion.  CT Head: IMPRESSION: 1. Stable diffuse atrophy and moderate small vessel ischemic change. No acute intracranial abnormality. 2. Vague low-attenuation within [facility] insular ribbon on [facility] left seen on only 1 image and therefore of questionable significance. Consider MR if further assessment is warranted.  MR Brain: IMPRESSION: 1. Small volume of hemorrhage within Ebony occipital horns of lateral ventricles. 2. Poor flow related signal within left transverse and sigmoid sinus may represent sinus thrombosis. Consider CTV or MRV of [facility]ony head for further characterization. 3. Stable chronic small left anterior frontal cortical infarct. 4. Stable  chronic microvascular ischemic changes and parenchymal volume loss of Ebony brain.  MRV Head: IMPRESSION: - Abnormal signal within Ebony left sigmoid sinus and upper internal jugular vein is likely to represent slow flow with a prominent arachnoid granulation at Ebony transverse sigmoid junction. - No dural venous sinus thrombosis is identified.   Assessment & Plan by Problem:  Ebony [name] is an [name] with history of TIA/CVA, Vascular dementia, Hypertension, Paroxysmal Afib (not on blood thinners), GERD, Hypothyroidism and Recurrent falls present with Altered mental status and apparent R sided weakness.  Altered Mental Status: [name] with 1-2 days of increasing altered mental status in [facility] setting of recent falls with subacute changes on head imaging; hypertensive urgency; hx of CVA; bacteruria without ability to assess for symptoms of infection; and recent admission for AMS 2/2 polypharmacy. Neurology and Neurosurgery consulted in [facility] ED, CT/MRI findings deemed subacute, stable, and consistent with recent prior exams. Ceftriaxone 1g IV given in [facility] ED. - Continue ASA 81mg  Daily - Neuro Checks q4h - SLP Swallow study - Repeat EKG in AM - Repeat BMP and CBC in AM - Urine Culture pending  Hypertensive Urgency: BP 200s/100s on presentation improved with labetalol 10mg  IV in [facility] ED to 160s/90s, but came back up to 180s/90s.   - Labetalol 5mg  IV q2h PRN (SBP > 150) - Continue Lisinopril 40mg  Daily - Continue Metoprolol succinate 150mg  Daily  GERD: - Continue Protonix 20mg  Daily  Hypothyroidism: - Continue Levothyroxine Daily   FEN: NS @ 43mL/hr, NPO DVT Prophylaxis: SCDs Code Status: DNR, not confirmed on admission   Dispo: Admit [name] to Observation with expected length of stay less than 2 midnights.  Signed: Beola Cord, MD 10/21/2016, 12:05 AM  Pager: [phone]

## 2016-10-20 NOTE — ED Notes (Signed)
Pt's Daughter Maye Hides, would like someone to call her at [phone] as soon as an eval is done. State's she is pt's POA.

## 2016-10-20 NOTE — ED Notes (Signed)
Note put in at 1030 by this NS was charted in error.

## 2016-10-20 NOTE — ED Notes (Signed)
Pt able to communicate w/ RN stating she was cold and wanted "that thing off"(blood pressure cuff).

## 2016-10-21 DIAGNOSIS — F419 Anxiety disorder, unspecified: Secondary | ICD-10-CM | POA: Diagnosis present

## 2016-10-21 DIAGNOSIS — R4701 Aphasia: Secondary | ICD-10-CM | POA: Diagnosis not present

## 2016-10-21 DIAGNOSIS — N3010 Interstitial cystitis (chronic) without hematuria: Secondary | ICD-10-CM | POA: Diagnosis not present

## 2016-10-21 DIAGNOSIS — I69311 Memory deficit following cerebral infarction: Secondary | ICD-10-CM | POA: Diagnosis not present

## 2016-10-21 DIAGNOSIS — R296 Repeated falls: Secondary | ICD-10-CM | POA: Diagnosis not present

## 2016-10-21 DIAGNOSIS — Z880 Allergy status to penicillin: Secondary | ICD-10-CM | POA: Diagnosis not present

## 2016-10-21 DIAGNOSIS — E785 Hyperlipidemia, unspecified: Secondary | ICD-10-CM | POA: Diagnosis present

## 2016-10-21 DIAGNOSIS — R69 Illness, unspecified: Secondary | ICD-10-CM | POA: Diagnosis not present

## 2016-10-21 DIAGNOSIS — I160 Hypertensive urgency: Secondary | ICD-10-CM | POA: Diagnosis present

## 2016-10-21 DIAGNOSIS — Z9181 History of falling: Secondary | ICD-10-CM | POA: Diagnosis not present

## 2016-10-21 DIAGNOSIS — Z888 Allergy status to other drugs, medicaments and biological substances status: Secondary | ICD-10-CM | POA: Diagnosis not present

## 2016-10-21 DIAGNOSIS — G464 Cerebellar stroke syndrome: Secondary | ICD-10-CM | POA: Diagnosis not present

## 2016-10-21 DIAGNOSIS — R2681 Unsteadiness on feet: Secondary | ICD-10-CM | POA: Diagnosis not present

## 2016-10-21 DIAGNOSIS — F0150 Vascular dementia without behavioral disturbance: Secondary | ICD-10-CM | POA: Diagnosis present

## 2016-10-21 DIAGNOSIS — E039 Hypothyroidism, unspecified: Secondary | ICD-10-CM | POA: Diagnosis present

## 2016-10-21 DIAGNOSIS — G629 Polyneuropathy, unspecified: Secondary | ICD-10-CM | POA: Diagnosis present

## 2016-10-21 DIAGNOSIS — I10 Essential (primary) hypertension: Secondary | ICD-10-CM

## 2016-10-21 DIAGNOSIS — N3000 Acute cystitis without hematuria: Secondary | ICD-10-CM | POA: Diagnosis not present

## 2016-10-21 DIAGNOSIS — I615 Nontraumatic intracerebral hemorrhage, intraventricular: Secondary | ICD-10-CM | POA: Diagnosis not present

## 2016-10-21 DIAGNOSIS — R4182 Altered mental status, unspecified: Secondary | ICD-10-CM | POA: Diagnosis not present

## 2016-10-21 DIAGNOSIS — E538 Deficiency of other specified B group vitamins: Secondary | ICD-10-CM | POA: Diagnosis present

## 2016-10-21 DIAGNOSIS — K219 Gastro-esophageal reflux disease without esophagitis: Secondary | ICD-10-CM | POA: Diagnosis present

## 2016-10-21 DIAGNOSIS — F329 Major depressive disorder, single episode, unspecified: Secondary | ICD-10-CM | POA: Diagnosis present

## 2016-10-21 DIAGNOSIS — Z79899 Other long term (current) drug therapy: Secondary | ICD-10-CM | POA: Diagnosis not present

## 2016-10-21 DIAGNOSIS — Z5189 Encounter for other specified aftercare: Secondary | ICD-10-CM | POA: Diagnosis not present

## 2016-10-21 DIAGNOSIS — M6281 Muscle weakness (generalized): Secondary | ICD-10-CM | POA: Diagnosis not present

## 2016-10-21 DIAGNOSIS — Z7982 Long term (current) use of aspirin: Secondary | ICD-10-CM | POA: Diagnosis not present

## 2016-10-21 DIAGNOSIS — Z66 Do not resuscitate: Secondary | ICD-10-CM | POA: Diagnosis present

## 2016-10-21 DIAGNOSIS — Z993 Dependence on wheelchair: Secondary | ICD-10-CM | POA: Diagnosis not present

## 2016-10-21 DIAGNOSIS — R531 Weakness: Secondary | ICD-10-CM | POA: Diagnosis present

## 2016-10-21 DIAGNOSIS — G9341 Metabolic encephalopathy: Secondary | ICD-10-CM | POA: Diagnosis not present

## 2016-10-21 DIAGNOSIS — I480 Paroxysmal atrial fibrillation: Secondary | ICD-10-CM | POA: Diagnosis present

## 2016-10-21 DIAGNOSIS — R2689 Other abnormalities of gait and mobility: Secondary | ICD-10-CM | POA: Diagnosis not present

## 2016-10-21 DIAGNOSIS — Z882 Allergy status to sulfonamides status: Secondary | ICD-10-CM | POA: Diagnosis not present

## 2016-10-21 DIAGNOSIS — N390 Urinary tract infection, site not specified: Secondary | ICD-10-CM | POA: Diagnosis not present

## 2016-10-21 DIAGNOSIS — B952 Enterococcus as the cause of diseases classified elsewhere: Secondary | ICD-10-CM | POA: Diagnosis not present

## 2016-10-21 LAB — BASIC METABOLIC PANEL
ANION GAP: 12 (ref 5–15)
BUN: 10 mg/dL (ref 6–20)
CHLORIDE: 97 mmol/L — AB (ref 101–111)
CO2: 24 mmol/L (ref 22–32)
Calcium: 8.9 mg/dL (ref 8.9–10.3)
Creatinine, Ser: 0.75 mg/dL (ref 0.44–1.00)
GFR calc Af Amer: >60 mL/min (ref >60)
GFR calc non Af Amer: >60 mL/min (ref >60)
GLUCOSE: 101 mg/dL — AB (ref 65–99)
POTASSIUM: 3.5 mmol/L (ref 3.5–5.1)
Sodium: 133 mmol/L — ABNORMAL LOW (ref 135–145)

## 2016-10-21 MED ORDER — ACETAMINOPHEN 650 MG RE SUPP: 650.0000 mg | Freq: Four times a day (QID) | RECTAL | Status: DC | PRN

## 2016-10-21 MED ORDER — LEVOTHYROXINE SODIUM 25 MCG PO TABS: 25.0000 ug | ORAL_TABLET | Freq: Every day | ORAL | Status: DC

## 2016-10-21 MED ORDER — POLYVINYL ALCOHOL 1.4 % OP SOLN
1.0000 [drp] | Freq: Every morning | OPHTHALMIC | Status: DC
  Administered 2016-10-21 – 2016-10-24 (×4): 1 [drp] via OPHTHALMIC
  Filled 2016-10-21: qty 15

## 2016-10-21 MED ORDER — METOPROLOL SUCCINATE ER 25 MG PO TB24: 150.0000 mg | ORAL_TABLET | Freq: Every day | ORAL | Status: DC

## 2016-10-21 MED ORDER — ACETAMINOPHEN 325 MG PO TABS: 650.0000 mg | ORAL_TABLET | Freq: Four times a day (QID) | ORAL | Status: DC | PRN

## 2016-10-21 MED ORDER — HYDRALAZINE HCL 20 MG/ML IJ SOLN
5.0000 mg | Freq: Once | INTRAMUSCULAR | Status: AC
  Administered 2016-10-21: 5 mg via INTRAVENOUS
  Filled 2016-10-21: qty 1

## 2016-10-21 MED ORDER — POLYETHYLENE GLYCOL 3350 17 G PO PACK: 17.0000 g | PACK | Freq: Every day | ORAL | Status: DC | PRN

## 2016-10-21 MED ORDER — LISINOPRIL 20 MG PO TABS: 40.0000 mg | ORAL_TABLET | Freq: Every day | ORAL | Status: DC

## 2016-10-21 MED ORDER — FLUTICASONE PROPIONATE 50 MCG/ACT NA SUSP
2.0000 | Freq: Every day | NASAL | Status: DC
  Administered 2016-10-22 – 2016-10-24 (×3): 2 via NASAL
  Filled 2016-10-21: qty 16

## 2016-10-21 MED ORDER — ASPIRIN 81 MG PO CHEW
81.0000 mg | CHEWABLE_TABLET | Freq: Every day | ORAL | Status: DC
  Administered 2016-10-22 – 2016-10-24 (×3): 81 mg via ORAL
  Filled 2016-10-21 (×3): qty 1

## 2016-10-21 MED ORDER — SENNA 8.6 MG PO TABS
1.0000 | ORAL_TABLET | ORAL | Status: DC
  Administered 2016-10-23: 8.6 mg via ORAL
  Filled 2016-10-21: qty 1

## 2016-10-21 MED ORDER — PSYLLIUM 95 % PO PACK
1.0000 | PACK | Freq: Every day | ORAL | Status: DC
  Administered 2016-10-22: 1 via ORAL
  Filled 2016-10-21 (×3): qty 1

## 2016-10-21 MED ORDER — IBUPROFEN 200 MG PO TABS: 200.0000 mg | ORAL_TABLET | Freq: Two times a day (BID) | ORAL | Status: DC | PRN

## 2016-10-21 MED ORDER — SODIUM CHLORIDE 0.9 % IV SOLN
INTRAVENOUS | Status: AC
Start: 2016-10-21 — End: 2016-10-22
  Administered 2016-10-21: 15:00:00 via INTRAVENOUS

## 2016-10-21 MED ORDER — LABETALOL HCL 5 MG/ML IV SOLN
10.0000 mg | INTRAVENOUS | Status: DC | PRN
  Administered 2016-10-21 (×3): 10 mg via INTRAVENOUS
  Administered 2016-10-21: 5 mg via INTRAVENOUS
  Administered 2016-10-22 (×3): 10 mg via INTRAVENOUS
  Filled 2016-10-21 (×7): qty 4

## 2016-10-21 MED ORDER — PANTOPRAZOLE SODIUM 20 MG PO TBEC
20.0000 mg | DELAYED_RELEASE_TABLET | Freq: Every day | ORAL | Status: DC
  Administered 2016-10-22 – 2016-10-23 (×2): 20 mg via ORAL
  Filled 2016-10-21 (×4): qty 1

## 2016-10-21 MED ORDER — LEVOTHYROXINE SODIUM 100 MCG IV SOLR
12.5000 ug | Freq: Every day | INTRAVENOUS | Status: DC
  Administered 2016-10-21 – 2016-10-22 (×2): 12.5 ug via INTRAVENOUS
  Filled 2016-10-21 (×2): qty 5

## 2016-10-21 MED ORDER — CLONIDINE HCL 0.1 MG/24HR TD PTWK
0.1000 mg | MEDICATED_PATCH | TRANSDERMAL | Status: DC
Start: 2016-10-21 — End: 2016-10-22
  Administered 2016-10-21: 0.1 mg via TRANSDERMAL
  Filled 2016-10-21: qty 1

## 2016-10-21 MED ORDER — DEXTROSE 5 % IV SOLN
1.0000 g | INTRAVENOUS | Status: DC
  Administered 2016-10-21: 1 g via INTRAVENOUS
  Filled 2016-10-21 (×2): qty 10

## 2016-10-21 NOTE — Progress Notes (Signed)
Internal Medicine Attending  Date: 10/21/2016  Patient name: Ebony [name] Medical record number: [id number] Date of birth: November 17, 1931 Age: 83 y.o. Gender: female  I saw and evaluated the patient. I reviewed the resident's note by Dr. Saunders Revel and I agree with the resident's findings and plans as documented in her progress note.  Please see my H&P dated 10/21/2016 and attached to Dr. Vesta Mixer H&P dated 10/20/2016 for the specifics of my evaluation, assessment, and plan from earlier in the day.

## 2016-10-21 NOTE — Progress Notes (Signed)
Patient arrived to 5M03 from ED. Patient alert but will not answer questions. Patient did nod her head yes when asked if her name was [place]. Will follow some commands. Vitals taken and BP 158/95. Labetalol given per order.  Patient placed on telemetry. RN will continue to monitor.

## 2016-10-21 NOTE — Progress Notes (Signed)
Subjective:  Ebony [name] was seen laying comfortably in her bed this morning. She awakes to voice and smiles pleasantly during exam. She answers questions with one word answers. She does not indicate any current pain or appear to be in acute distress.  Objective:  Vital signs in last 24 hours: Vitals:   10/21/16 0451 10/21/16 0600 10/21/16 0750 10/21/16 0918  BP: (!) 167/90 (!) 171/79 (!) 184/97 (!) 177/90  Pulse: 78   84  Resp: 20     Temp: 98.4 F (36.9 C)     TempSrc: Axillary     SpO2: 93%      Physical Exam  Constitutional:  Elderly, thin female laying in bed. Easily aroused and in no acute distress.  Cardiovascular: Normal rate, regular rhythm, normal heart sounds and intact distal pulses.   No murmur heard. Pulmonary/Chest: Breath sounds normal. No respiratory distress. She has no wheezes.  Abdominal: Soft. She exhibits no distension. There is no tenderness. There is no guarding.  Musculoskeletal: She exhibits no edema (of bilateral lower extremities) or tenderness (of bilateral lower extremities).  RLE oddly positioned in bed with out turning of the foot, in almost a frog like position. Patient denies pain with palpation. No overt deformities observed. Patient does not follow commands to move leg.  Neurological:  Patient alert but unable to answer questions regarding orientation. She only gives one word yes or no answers during exam. Gross motor and sensation of upper and lower extremities intact.   Assessment/Plan:  Active Problems:   Intracranial hemorrhage (HCC)  Ebony [name] is an 83 yo with a PMH of CVA, vascular dementia, HTN, and expressive aphasia who presented to the ED after the patient was found to be have decreased responsiveness and possible right sided weakness. The patient's head imaging on admission revealed chronic, stable changes that were consistent with previous imaging. Neurology and neurosurgery were consulted and did not suggest any  intervention needed for chronic findings. She was admitted to the internal medicine teaching service for management. The specific problems addressed during admission were as follows:  Altered Mental Status: Patient has had multiple episodes of AMS and falls in the recent months. Per chart review she has been to the ED or admitted on at least 7 occasions since 03/2016 for complications with falls or altered mental status. During each admission the patient's mental status improves, but it is unclear whether or not she returns to baseline. Her MOCA during last admission was 9 2/2 hx of CVA and vascular dementia, so mental status is a difficult parameter to assess whether or not the patient has "returned to baseline". She has, however, exhibited a significant decrease in her physical functioning throughout these hospitalizations, as evidenced by the fact that she has been seen in the ED for falls on at least 5 separate occasions since 08/2016 and her head CT/MRI show chronic changes consistent with falling. These facts suggest the patient never really returns to baseline during her hospitalizations but rather has had a stepwise decline in her physical functioning. It is possible that her current presentation is a result of a new, unwitnessed fall. This is less likely given no evidence of fall on physical exam and no new current findings on head imaging. I think her current presentation is most consistent with a UTI, as her urinalysis on admission showed large LE, too numerous to count WBCs, and few bacteria (wheras most recent UA from 7/2, 7/6, and 8/9 did not show WBC's/LE/nitrites consistent with infection). Her  Cr. Is at baseline of 0.75, not suggesting AKI or dehydration. She was given a dose of IV ceftriaxone in the ED on 10/20/16. Plan to treat this uncomplicated UTI with 3 days antibiotic therapy and monitor mental status. Will also obtain PT/OT evaluation to assess strength and determine if she can make  improvements making her less likely to fall outside of the hospital.  -Continue ASA 81mg  daily for hx of CVA -Continue IV ceftriaxone x3 doses (end date of 8/24) -Neuro Checks q4h -PT/OT evaluation and treatment, appreciate recommendations  Hypertensive Urgency: BP on arrival >210/100 and her SBP has been elevated >150 since arrival to the floor. Will restart home medications today, which include lisinopril and metoprolol, as her BP is managed <140/80 on this regimen per [facility]. The patient will continue PRN labetalol use until BP normalizes.  -Continue Labetalol 5mg  IV q2h PRN (SBP > 150) -Start home lisinopril 40 mg and metoprolol 150 mg qD  History of paroxysmal A-Fib: Patient rate controlled with metoprolol but not currently on anti-coagulation 2/2 fall risk.   GERD: Home protonix 20mg  Daily  Hypothyroidism: Home levothyroxine Daily  FEN: NS @ 75mL/hr, NPO pending SLP evaluation DVT Prophylaxis: SCDs Code Status: DNR, not confirmed on admission  Dispo: Anticipated discharge in approximately 1-2 day(s) pending improvement in mental status and PT/OT evaluation/recommendations for further strength condition/falls risk reduction.   Rozann Lesches, MD 10/21/2016, 9:19 AM Pager: [phone]

## 2016-10-21 NOTE — Evaluation (Signed)
Physical Therapy Evaluation Patient Details Name: Ebony [name] MRN: [id number] DOB: 1931-10-30 Today's Date: 10/21/2016   History of Present Illness  83 y.o. female admitted on 10/20/16 for decreased responsiveness.  Pt with AMS with frequent falls, HTN Urgency, and stable chronic ICH (neurology and neurosurgery consults complete).  Pt with significant PMH of SAH, SDH (05/2011), HTN, dementia, and CVA.  Clinical Impression  Pt is not saying much, one word, and out of context to the situation.  She tolerated LE and UE ROM and assessment at bed level without signs of pain (no facial grimacing).  She was able to sit EOB with supervision once positioned there, but did not help with transitions to and from sitting.  She was also able to tolerate 1/2 standing from elevated bed, but was unable to walk at this time.  SNF for rehab would be her most appropriate d/c at this point.  PT to attempt to walk with second person next session.  During her last admission on 10/07/16 she walked 53' with max assist and two person hand held assist.   PT to follow acutely for deficits listed below.     Follow Up Recommendations SNF    Equipment Recommendations  Wheelchair (measurements PT);Wheelchair cushion (measurements PT);Hospital bed;Other (comment) (hoyer lift)    Recommendations for Other Services   NA    Precautions / Restrictions Precautions Precautions: Fall Precaution Comments: h/o multiple falls in the past 6 months.      Mobility  Bed Mobility Overal bed mobility: Needs Assistance Bed Mobility: Supine to Sit;Sit to Supine;Rolling Rolling: Total assist   Supine to sit: Total assist Sit to supine: Total assist   General bed mobility comments: Total assist to initiate movement to EOB and back into bed.  Therapist assisted legs over, then used elevated HOB to to help support trunk and bed pad to scoot hips to EOB. Assist at both trunk and legs to return to supine.   Transfers Overall transfer  level: Needs assistance Equipment used: None Transfers: Sit to/from Stand Sit to Stand: Max assist;From elevated surface         General transfer comment: Max assist from elevated bed with bed pad at hips to control hip extension.  Pt did not crumble over her legs, but provided very litle help for partial transition to stand.   Ambulation/Gait             General Gait Details: unable at this time and unsafe without a second person to assist.          Balance Overall balance assessment: Needs assistance Sitting-balance support: Feet supported;No upper extremity supported Sitting balance-Leahy Scale: Fair Sitting balance - Comments: supervision once positioned EOB with both feet supported.    Standing balance support: No upper extremity supported Standing balance-Leahy Scale: Zero Standing balance comment: max assist for partial stand EOB.                              Pertinent Vitals/Pain Pain Assessment: Faces Faces Pain Scale: No hurt    Home Living Family/patient expects to be discharged to:: Skilled nursing facility                      Prior Function           Comments: No one present to report most recent mobility level at 10/07/16 admission was two person max hand held assist to walk 12'  Extremity/Trunk Assessment   Upper Extremity Assessment Upper Extremity Assessment: Defer to OT evaluation (right arm seems to be her weaker side)    Lower Extremity Assessment Lower Extremity Assessment: RLE deficits/detail;LLE deficits/detail RLE Deficits / Details: Pt would not actively move her right or left leg for me to command.  Multimodal cues and extra time given to try to complete tasks, but unsuccessful.  She has some strength, though, as I was able to get her to partial standing from EOB and she did not collapse over her legs and was able to sit EOB with supervision once positioned there.  LLE Deficits / Details: Pt would not  actively move her right or left leg for me to command.  Multimodal cues and extra time given to try to complete tasks, but unsuccessful.  She has some strength, though, as I was able to get her to partial standing from EOB and she did not collapse over her legs and was able to sit EOB with supervision once positioned there.     Cervical / Trunk Assessment Cervical / Trunk Assessment: Normal  Communication   Communication: Expressive difficulties  Cognition Arousal/Alertness: Awake/alert Behavior During Therapy: Flat affect Overall Cognitive Status: History of cognitive impairments - at baseline Area of Impairment: Orientation;Attention;Memory;Following commands;Safety/judgement;Awareness;Problem solving                 Orientation Level: Disoriented to;Place;Time;Situation Current Attention Level: Focused Memory: Decreased short-term memory Following Commands: Follows one step commands with increased time;Follows one step commands inconsistently Safety/Judgement: Decreased awareness of safety;Decreased awareness of deficits Awareness: Intellectual Problem Solving: Slow processing;Decreased initiation;Difficulty sequencing;Requires verbal cues;Requires tactile cues General Comments: Pt able to follow one command for me to squeeze my hand on her left side, however, then she was unable to release her grasp on my hand.  No commands to move legs in the bed or in sitting.  No initiation of mocement.             Assessment/Plan    PT Assessment Patient needs continued PT services  PT Problem List Decreased strength;Decreased activity tolerance;Decreased balance;Decreased mobility;Decreased coordination;Decreased cognition;Decreased knowledge of use of DME;Decreased safety awareness;Decreased knowledge of precautions       PT Treatment Interventions DME instruction;Gait training;Functional mobility training;Therapeutic activities;Therapeutic exercise;Balance training;Neuromuscular  re-education;Cognitive remediation;Patient/family education    PT Goals (Current goals can be found in the Care Plan section)  Acute Rehab PT Goals Patient Stated Goal: unable to state PT Goal Formulation: Patient unable to participate in goal setting Time For Goal Achievement: 11/04/16 Potential to Achieve Goals: Fair    Frequency Min 2X/week           AM-PAC PT "6 Clicks" Daily Activity  Outcome Measure Difficulty turning over in bed (including adjusting bedclothes, sheets and blankets)?: Unable Difficulty moving from lying on back to sitting on the side of the bed? : Unable Difficulty sitting down on and standing up from a chair with arms (e.g., wheelchair, bedside commode, etc,.)?: Unable Help needed moving to and from a bed to chair (including a wheelchair)?: Total Help needed walking in hospital room?: Total Help needed climbing 3-5 steps with a railing? : Total 6 Click Score: 6    End of Session   Activity Tolerance: Patient tolerated treatment well Patient left: in bed;with call bell/phone within reach;with bed alarm set   PT Visit Diagnosis: Repeated falls (R29.6);Difficulty in walking, not elsewhere classified (R26.2);Other symptoms and signs involving the nervous system (A06.015)    Time: 6153-7943 PT Time Calculation (  min) (ACUTE ONLY): 15 min   Charges:   PT Evaluation $PT Eval Moderate Complexity: 1 Mod         Tamicka Shimon B. Armaan Pond, PT, DPT [phone]   10/21/2016, 11:28 PM

## 2016-10-21 NOTE — Clinical Social Work Note (Signed)
Clinical Social Work Assessment  Patient Details  Name: Ebony [name] MRN: [id number] Date of Birth: 1931/09/17  Date of referral:  10/21/16               Reason for consult:  Facility Placement, Discharge Planning                Permission sought to share information with:  Facility Medical sales representative, Family Supports Permission granted to share information::  Yes, Verbal Permission Granted  Name::     Training and development officer::  ALF, SNF  Relationship::  Daughter/POA  Contact Information:     Housing/Transportation Living arrangements for the past 2 months:  Assisted Living Facility Source of Information:  Adult Children Patient Interpreter Needed:  None Criminal Activity/Legal Involvement Pertinent to Current Situation/Hospitalization:  No - Comment as needed Significant Relationships:  Adult Children, Other Family Members Lives with:  Self, Facility Resident Do you feel safe going back to the place where you live?  Yes Need for family participation in patient care:  Yes (Comment) (patient is confused; daughter is POA)  Care giving concerns:  Patient has been residing at [facility] for the past two and a half years, and family is satisfied with care received there.    Social Worker assessment / plan:  CSW introduced self to patient's daughter, Darl Pikes, over phone to discuss discharge planning. Patient's daughter confirmed that patient has been living at [facility]. Patient's daughters are in the process of getting her moved to [facility] for more skilled care, but would like her to return to [facility] at this time until next week when they can finalize the patient's move. CSW to follow to facilitate discharge to ALF when medically ready.   Employment status:  Retired Database administrator PT Recommendations:  Not assessed at this time Information / Referral to community resources:     Patient/Family's Response to care:  Patient's daughter agreeable  to return to ALF as they finalize transition to SNF long-term.  Patient/Family's Understanding of and Emotional Response to Diagnosis, Current Treatment, and Prognosis:  Patient's daughter is aware that the patient is requiring a higher level of care at this time than what the ALF can provide, and acknowledged that they have been in the process of getting the patient moved to SNF long-term. Patient's daughter explained being hesitant to move her directly from the hospital over to SNF due to a previously planned vacation where the patient's daughters will be out of town the rest of the week, they would prefer to be home and available to assist the patient with moving and settling into her new home after they return from vacation. Patient's daughter is appreciative of the assistance in aiding the patient with transitioning back home when ready to discharge from the hospital.  Emotional Assessment Appearance:  Appears stated age Attitude/Demeanor/Rapport:  Unable to Assess Affect (typically observed):  Unable to Assess Orientation:  Oriented to Self Alcohol / Substance use:  Not Applicable Psych involvement (Current and /or in the community):  No (Comment)  Discharge Needs  Concerns to be addressed:  Care Coordination, Discharge Planning Concerns Readmission within the last 30 days:  Yes Current discharge risk:  Physical Impairment, Cognitively Impaired Barriers to Discharge:  Continued Medical Work up, Insurance Authorization   Baldemar Lenis, LCSW 10/21/2016, 10:50 AM

## 2016-10-21 NOTE — Progress Notes (Signed)
   10/21/16 1100  SLP Visit Information  SLP Received On 10/21/16  General Information  HPI Ms. Royle is an 83 year old woman with a history of hypertension, hyperlipidemia, hypothyroidism, depression, prior cerebrovascular accident, history of subarachnoid hemorrhage and subdural hematoma, and frequent falls currently residing in an assisted living facility who presents with decreased responsiveness from baseline 1 day. Prior episodes of decreased responsiveness were due to polypharmacy, this admit MD suspects UTI.   Type of Study Bedside Swallow Evaluation  Previous Swallow Assessment BSE in April with normal function  Diet Prior to this Study NPO  Temperature Spikes Noted No  Respiratory Status Room air  History of Recent Intubation No  Behavior/Cognition Uncooperative;Lethargic/Drowsy;Doesn't follow directions;Confused  Oral Cavity Assessment Dry  Oral Care Completed by SLP No  Oral Cavity - Dentition Adequate natural dentition  Vision Impaired for self-feeding  Self-Feeding Abilities Total assist  Patient Positioning Upright in bed  Baseline Vocal Quality Not observed  Volitional Cough Cognitively unable to elicit  Volitional Swallow Unable to elicit  Oral Motor/Sensory Function  Overall Oral Motor/Sensory Function Generalized oral weakness  Ice Chips  Ice chips Impaired  Presentation Spoon  Oral Phase Impairments Reduced labial seal;Reduced lingual movement/coordination;Poor awareness of bolus  Oral Phase Functional Implications Oral holding  Thin Liquid  Thin Liquid Impaired  Presentation Cup  Oral Phase Impairments Poor awareness of bolus  Oral Phase Functional Implications Right anterior spillage  Nectar Thick Liquid  Nectar Thick Liquid NT  Honey Thick Liquid  Honey Thick Liquid NT  Puree  Puree Impaired  Presentation Spoon  Oral Phase Impairments Poor awareness of bolus  Oral Phase Functional Implications Prolonged oral transit;Oral holding  Solid  Solid NT   SLP Assessment  Clinical Impression Statement (ACUTE ONLY) Pt is awake but unresponsive to PO trials or SLP interventions during assessment. Total assist needed for feeding. Despite max vebal, tactile and hand over hand assist pt initiated no movement to actively consume PO, seal lips to cup or straw. When ice or puree is placed in her mouth she briefly manipulates then holds the bolus. Pt is not ready for PO intake today, but expect pt to improve to baseline normal swallow function during acute admission. Will follow for trials.   SLP Visit Diagnosis Dysphagia, unspecified (R13.10)  Impact on safety and function Moderate aspiration risk  Other Related Risk Factors Cognitive impairment  Swallow Evaluation Recommendations  SLP Diet Recommendations NPO  Treatment Plan  Oral Care Recommendations Oral care QID  Treatment Recommendations Therapy as outlined in treatment plan below  Follow up Recommendations Skilled Nursing facility  Speech Therapy Frequency (ACUTE ONLY) min 2x/week  Treatment Duration 2 weeks  Interventions Aspiration precaution training;Compensatory techniques;Patient/family education;Trials of upgraded texture/liquids;Diet toleration management by SLP  Prognosis  Prognosis for Safe Diet Advancement Good  Individuals Consulted  Consulted and Agree with Results and Recommendations Patient unable/family or caregiver not available  SLP Time Calculation  SLP Start Time (ACUTE ONLY) 1130  SLP Stop Time (ACUTE ONLY) 1145  SLP Time Calculation (min) (ACUTE ONLY) 15 min  SLP Evaluations  $ SLP Speech Visit 1 Procedure  SLP Evaluations  $BSS Swallow 1 Procedure

## 2016-10-22 MED ORDER — AMOXICILLIN 250 MG/5ML PO SUSR
500.0000 mg | Freq: Two times a day (BID) | ORAL | Status: DC
  Administered 2016-10-22 – 2016-10-24 (×4): 500 mg via ORAL
  Filled 2016-10-22 (×4): qty 10

## 2016-10-22 MED ORDER — LABETALOL HCL 5 MG/ML IV SOLN
5.0000 mg | INTRAVENOUS | Status: AC
  Administered 2016-10-22: 5 mg via INTRAVENOUS
  Filled 2016-10-22: qty 4

## 2016-10-22 MED ORDER — HYDRALAZINE HCL 20 MG/ML IJ SOLN: 5.0000 mg | INTRAMUSCULAR | Status: DC | PRN

## 2016-10-22 MED ORDER — METOPROLOL SUCCINATE ER 25 MG PO TB24
150.0000 mg | ORAL_TABLET | Freq: Every day | ORAL | Status: DC
  Administered 2016-10-22 – 2016-10-24 (×3): 150 mg via ORAL
  Filled 2016-10-22 (×3): qty 2

## 2016-10-22 MED ORDER — LEVOTHYROXINE SODIUM 25 MCG PO TABS
25.0000 ug | ORAL_TABLET | Freq: Every day | ORAL | Status: DC
  Administered 2016-10-23 – 2016-10-24 (×2): 25 ug via ORAL
  Filled 2016-10-22 (×2): qty 1

## 2016-10-22 MED ORDER — SODIUM CHLORIDE 0.9 % IV SOLN
INTRAVENOUS | Status: AC
  Administered 2016-10-22: 14:00:00 via INTRAVENOUS

## 2016-10-22 MED ORDER — LISINOPRIL 20 MG PO TABS
40.0000 mg | ORAL_TABLET | Freq: Every day | ORAL | Status: DC
  Administered 2016-10-22 – 2016-10-24 (×3): 40 mg via ORAL
  Filled 2016-10-22 (×3): qty 2

## 2016-10-22 NOTE — Progress Notes (Signed)
Internal Medicine Attending  Date: 10/22/2016  Patient name: Ebony [name] Medical record number: [id number] Date of birth: [birth year] Age: 83 y.o. Gender: female  I saw and evaluated the patient. I reviewed the resident's note by Dr. Saunders Revel and I agree with the resident's findings and plans as documented in her progress note.  Ebony [name] was more alert and interactive today than yesterday. She's not quite at her best which we observed at the end of her admissions in July and earlier this month. Apparently the assisted living facility has refused to take her back is they believe she requires a higher level of care and we are fully behind this assessment. Her family therefore is now willing to consider transfer to a skilled nursing facility which would be much more appropriate for Ebony [name] moving forward. I suspect after the final dose of antibiotics tonight we will continue to see improvement towards baseline and fully expect she will be stable for discharge to a skilled nursing facility as early as tomorrow if accepted.

## 2016-10-22 NOTE — Progress Notes (Signed)
  Speech Language Pathology Treatment: Dysphagia  Patient Details Name: Ebony [name] MRN: [id number] DOB: [birth year] Today's Date: 10/22/2016 Time: 4825-0037 SLP Time Calculation (min) (ACUTE ONLY): 30 min  Assessment / Plan / Recommendation Clinical Impression  Pt demonstrates improved arousal and recognition of PO trials. Initially pt tolerates sips of water and bites of puree with difficulty. One instance of overlarge sip given by SLP resulted in a minor sensed aspiration event with prolonged coughing. Pt refused further trials. Given pts improved arousal to baseline and ability to tolerate PO in prior visits without difficulty, will advance diet with staff supervision and f/u for tolerance.  Pills to be given whole in puree.   HPI HPI: Ebony [name] is an 83 year old woman with a history of hypertension, hyperlipidemia, hypothyroidism, depression, prior cerebrovascular accident, history of subarachnoid hemorrhage and subdural hematoma, and frequent falls currently residing in an assisted living facility who presents with decreased responsiveness from baseline 1 day. Prior episodes of decreased responsiveness were due to polypharmacy, this admit MD suspects UTI.       SLP Plan  Continue with current plan of care       Recommendations  Diet recommendations: Dysphagia 3 (mechanical soft);Thin liquid Liquids provided via: Cup;Straw Medication Administration: Whole meds with puree Supervision: Full supervision/cueing for compensatory strategies Compensations: Slow rate;Small sips/bites Postural Changes and/or Swallow Maneuvers: Seated upright 90 degrees                Oral Care Recommendations: Oral care BID Follow up Recommendations: Skilled Nursing facility SLP Visit Diagnosis: Dysphagia, unspecified (R13.10) Plan: Continue with current plan of care       GO               [facility], [place] CCC-SLP [phone]  Claudine Mouton 10/22/2016, 2:48 PM

## 2016-10-22 NOTE — Progress Notes (Signed)
CSW has faxed over updated clinicals to [facility] for facility to continue with insurance authorization. CSW will update with information when received.  Blenda Nicely, [place] Clinical Social Worker [phone]

## 2016-10-22 NOTE — Progress Notes (Signed)
Subjective:  Ebony [name] was seen laying comfortably in bed this morning in no acute distress. She was easily arousable from sleep and more interactive with examiners and answered questions throughout examination. Denies chest pain, abdominal pain, and headaches.   Objective:  Vital signs in last 24 hours: Vitals:   10/22/16 0102 10/22/16 0411 10/22/16 0610 10/22/16 0912  BP: (!) 167/71 (!) 176/72 (!) 183/74 (!) 159/86  Pulse:  95 84 81  Resp:  16  20  Temp:  98.7 F (37.1 C)  98.2 F (36.8 C)  TempSrc:  Axillary  Oral  SpO2:  95% 95% 96%   Physical Exam  Constitutional:  Thin, elderly woman laying comfortably in bed in no acute distress  Cardiovascular: Normal rate, regular rhythm and intact distal pulses.  Exam reveals no friction rub.   No murmur heard. Pulmonary/Chest: Effort normal. No respiratory distress. She has no wheezes.  Abdominal: Soft. She exhibits no distension. There is no tenderness. There is no rebound and no guarding.  Musculoskeletal: She exhibits no edema (of bilateral lower extremities) or tenderness (of bilateral lower extremities).  Neurological:  She interacts pleasantly with interviewer. Patient was able to respond to yes/no questions during examination. She is still confused and orientation questions were difficult for her to answer. She can follow simple commands.  Skin: Skin is warm and dry. No rash noted. No erythema.   Assessment/Plan:  Active Problems:   Intracranial hemorrhage (HCC)   Intraventricular hemorrhage (HCC)   History of recent fall   Vascular dementia, uncomplicated   Acute cystitis without hematuria  Ebony [name] is an 83 yo with a PMH of CVA, vascular dementia, HTN, and expressive aphasia who presented to the ED after the patient was found to be have decreased responsiveness and possible right sided weakness. The patient's head imaging on admission revealed chronic, stable changes that were consistent with previous imaging.  Neurology and neurosurgery were consulted and did not suggest any intervention needed for chronic findings. She was admitted to the internal medicine teaching service for management. The specific problems addressed during admission were as follows:  Altered Mental Status: The patient's mental status has improved today, possibly with treatment of her UTI. She has received 2 of 3 doses of IV ceftriaxone, with final dose today. Will continue to monitor her mental status with treatment of this infection. Continue speech, occupational, and physical therapy consults. Recommendations appreciated. -Continue ASA 81mg  daily for hx of CVA -Continue IV ceftriaxone x3 doses (end date of 8/24) -Neuro Checks q4h -PT/OT evaluation and treatment, recommending SNF placement  Hypertensive Urgency: BP on arrival >210/100 and her SBP has been elevated >150 since arrival to the floor. Patient given PRN hydralazine and clonidine patch overnight. She is still NPO so cannot restart home medications right now. Plan to continue current BP regimen, but restart home medications once she can pass swallow study. Per records from [facility], her BP is managed <140/80 metoprolol and lisinopril. -Continue Labetalol 5mg  IV q2h PRN (SBP > 150), clonidine patch -Start home lisinopril 40 mg and metoprolol 150 mg qD when she can safely swallow  History of paroxysmal A-Fib: Patient rate controlled with metoprolol but not currently on anti-coagulation 2/2 fall risk.   GERD:  -Hold home protonix 20mg  daily while NPO  Hypothyroidism:  -Holding home levothyroxine daily while NPO -IV levothyroxine 12.28mcg daily while NPO  Diet: -Speech pathology still recommending NPO, plan to restart home medications when she can tolerate oral medications.   Dispo: Anticipated  discharge in approximately 1-2 day(s).   Rozann Lesches, MD 10/22/2016, 10:26 AM Pager: [phone]

## 2016-10-22 NOTE — Progress Notes (Addendum)
CSW alerted by RN that patient's daughter may have changed plans in terms of discharge. CSW contacted patient's daughter and confirmed that patient's daughter would like to transition patient to [facility] at discharge; [facility] does not think that the patient is safe to return to ALF level of care.   CSW to contact [facility] and work to facilitate discharge to SNF when medically ready.  Blenda Nicely, [place] Clinical Social Worker [phone]  UPDATE 10:45 AM:  CSW confirmed with [facility] that facility can accept patient. Facility has initiated English as a second language teacher, and will need insurance approval before they can accept patient. CSW will follow to update with additional information when received.  Blenda Nicely, [place] Clinical Social Worker [phone]

## 2016-10-22 NOTE — [place] FL2 (Signed)
[place] MEDICAID FL2 LEVEL OF CARE SCREENING TOOL     IDENTIFICATION  Patient Name: Ebony [name] Birthdate: Jun 25, 1931 Sex: female Admission Date (Current Location): 10/20/2016  [facility] and [place] Number:  Producer, television/film/video and Address:  [facility] [place] [facility], [address] [place], [place] [address]      Provider Number: [id number]  Attending Physician Name and Address:  Doneen Poisson, MD  Relative Name and Phone Number:       Current Level of Care: Hospital Recommended Level of Care: Skilled Nursing Facility Prior Approval Number:    Date Approved/Denied:   PASRR Number: [id number]  Discharge Plan: SNF    Current Diagnoses: Patient Active Problem List   Diagnosis Date Noted  . Intraventricular hemorrhage (HCC)   . History of recent fall   . Vascular dementia, uncomplicated   . Acute cystitis without hematuria   . Intracranial hemorrhage (HCC) 10/20/2016  . Hyponatremia 10/08/2016  . Hypertensive urgency 10/08/2016  . Polypharmacy   . Paroxysmal supraventricular tachycardia (HCC)   . Recurrent falls 09/04/2016  . Maxillary sinus fracture (HCC) 09/04/2016  . Closed fracture of nasal bone 09/04/2016  . Acute encephalopathy   . Expressive aphasia   . History of TIA (transient ischemic attack) and stroke 06/24/2016  . Depression with anxiety 06/24/2016  . Hypothyroid 06/24/2016  . GERD (gastroesophageal reflux disease) 06/24/2016  . Allergic rhinitis 06/08/2014  . DDD (degenerative disc disease), lumbar 06/08/2014  . Insomnia 12/07/2013  . Depression   . OA (osteoarthritis) 01/06/2013  . Loss of weight 07/13/2012  . Vascular dementia without behavioral disturbance 06/20/2012  . Hypothyroidism due to acquired atrophy of thyroid 04/15/2012  . Essential hypertension 05/16/2011  . Anxiety disorder 05/16/2011    Orientation RESPIRATION BLADDER Height & Weight     Self, Place  Normal Incontinent Weight:   Height:     BEHAVIORAL  SYMPTOMS/MOOD NEUROLOGICAL BOWEL NUTRITION STATUS      Incontinent Diet (dysphagia 3)  AMBULATORY STATUS COMMUNICATION OF NEEDS Skin   Extensive Assist Verbally Skin abrasions                       Personal Care Assistance Level of Assistance  Bathing, Feeding, Dressing Bathing Assistance: Maximum assistance Feeding assistance: Limited assistance Dressing Assistance: Maximum assistance     Functional Limitations Info             SPECIAL CARE FACTORS FREQUENCY  PT (By licensed PT), OT (By licensed OT), Speech therapy     PT Frequency: 5x/wk OT Frequency: 5x/wk     Speech Therapy Frequency: 3x/wk      Contractures      Additional Factors Info  Code Status, Allergies Code Status Info: DNR Allergies Info: Sulfa Antibiotics, Ace Inhibitors, Biaxin Clarithromycin, Penicillins, Klor-con Potassium Chloride, Cozaar Losartan Potassium           Current Medications (10/22/2016):  This is the current hospital active medication list Current Facility-Administered Medications  Medication Dose Route Frequency Provider Last Rate Last Dose  . 0.9 %  sodium chloride infusion   Intravenous Continuous Nedrud, Marybeth, MD      . acetaminophen (TYLENOL) tablet 650 mg  650 mg Oral Q6H PRN Reymundo Poll, MD       Or  . acetaminophen (TYLENOL) suppository 650 mg  650 mg Rectal Q6H PRN Reymundo Poll, MD      . aspirin chewable tablet 81 mg  81 mg Oral Daily Reymundo Poll, MD      .  cefTRIAXone (ROCEPHIN) 1 g in dextrose 5 % 50 mL IVPB  1 g Intravenous Q24H Rozann Lesches, MD   Stopped at 10/21/16 1852  . cloNIDine (CATAPRES - Dosed in mg/24 hr) patch 0.1 mg  0.1 mg Transdermal Weekly Darreld Mclean, MD   0.1 mg at 10/21/16 2146  . fluticasone (FLONASE) 50 MCG/ACT nasal spray 2 spray  2 spray Each Nare Daily Reymundo Poll, MD   2 spray at 10/22/16 1034  . ibuprofen (ADVIL,MOTRIN) tablet 200 mg  200 mg Oral Q12H PRN Reymundo Poll, MD      . labetalol  (NORMODYNE,TRANDATE) injection 10 mg  10 mg Intravenous Q2H PRN Reymundo Poll, MD   10 mg at 10/22/16 1033  . levothyroxine (SYNTHROID, LEVOTHROID) injection 12.5 mcg  12.5 mcg Intravenous Daily Nedrud, Jeanella Flattery, MD   12.5 mcg at 10/22/16 1033  . lisinopril (PRINIVIL,ZESTRIL) tablet 40 mg  40 mg Oral Daily Reymundo Poll, MD      . metoprolol succinate (TOPROL-XL) 24 hr tablet 150 mg  150 mg Oral Daily Reymundo Poll, MD      . pantoprazole (PROTONIX) EC tablet 20 mg  20 mg Oral QHS Guilloud, Eber Jones, MD      . polyethylene glycol (MIRALAX / GLYCOLAX) packet 17 g  17 g Oral Daily PRN Reymundo Poll, MD      . polyvinyl alcohol (LIQUIFILM TEARS) 1.4 % ophthalmic solution 1 drop  1 drop Both Eyes q morning - 10a Reymundo Poll, MD   1 drop at 10/22/16 1034  . psyllium (HYDROCIL/METAMUCIL) packet 1 packet  1 packet Oral QHS Reymundo Poll, MD      . senna [facility]) tablet 8.6 mg  1 tablet Oral Once per day on Mon Wed Fri Guilloud, Carolyn, MD         Discharge Medications: Please see discharge summary for a list of discharge medications.  Relevant Imaging Results:  Relevant Lab Results:   Additional Information SS#: [id number]  Baldemar Lenis, LCSW

## 2016-10-22 NOTE — Evaluation (Signed)
Occupational Therapy Evaluation Patient Details Name: Ebony [name] MRN: [id number] DOB: 1931/06/14 Today's Date: 10/22/2016    History of Present Illness 83 y.o. female admitted on 10/20/16 for decreased responsiveness.  Pt with AMS with frequent falls, HTN Urgency, and stable chronic ICH (neurology and neurosurgery consults complete).  Pt with significant PMH of SAH, SDH (05/2011), HTN, dementia, and CVA.   Clinical Impression   Unsure of independence PTA as no family or caregiver is present at the time of eval. Pt is currently max A for all ADL and max A for sit <>stand from EOB. Pt more vocal and using one and two words to communicate with OT today during session. Pt is also more responsive and initiating more this session with OT from PT notes from eval yesterday. Pt seemed to enjoy theatre music being played during session as she enjoyed the theatre. Pt will continue to require skilled OT in the acute setting and afterwards at the SNF level to maximize safety and independence in ADL.     Follow Up Recommendations  SNF;Supervision/Assistance - 24 hour    Equipment Recommendations  None recommended by OT    Recommendations for Other Services       Precautions / Restrictions Precautions Precautions: Fall Precaution Comments: h/o multiple falls in the past 6 months. Restrictions Weight Bearing Restrictions: No      Mobility Bed Mobility Overal bed mobility: Needs Assistance Bed Mobility: Supine to Sit;Sit to Supine;Rolling Rolling: Min assist   Supine to sit: Mod assist Sit to supine: Max assist   General bed mobility comments: Pt walking legs over to the side of the bed, initiating rolling, and using BUE to assist with sidelying to sit, Pt required max A to return BLE back into bed, bed scoot, Pt benefitted from verbal and tactile cues  Transfers Overall transfer level: Needs assistance Equipment used: None Transfers: Sit to/from Stand Sit to Stand: Min assist;Mod  assist         General transfer comment: min A for first sit <>stand, mod A for second    Balance Overall balance assessment: Needs assistance Sitting-balance support: Feet supported;No upper extremity supported Sitting balance-Leahy Scale: Fair Sitting balance - Comments: supervision once positioned EOB with both feet supported.    Standing balance support: No upper extremity supported Standing balance-Leahy Scale: Poor Standing balance comment: Pt able to come to full upright position with assist for balance and power up from OT                           ADL either performed or assessed with clinical judgement   ADL Overall ADL's : Needs assistance/impaired Eating/Feeding: NPO   Grooming: Maximal assistance   Upper Body Bathing: Maximal assistance   Lower Body Bathing: Maximal assistance   Upper Body Dressing : Maximal assistance   Lower Body Dressing: Total assistance   Toilet Transfer: Moderate assistance;+2 for physical assistance;Stand-pivot Toilet Transfer Details (indicate cue type and reason): Pt currently incontinent and using an external catheter. Pt with incontinence when OT assisted Pt with sit<>stand EOB Toileting- Clothing Manipulation and Hygiene: Total assistance       Functional mobility during ADLs:  (not safe to attempt at this time without more assitance) General ADL Comments: Pt is generally more responsive and following commands better today than from PT session yesterday     Vision   Additional Comments: not assessed this session due to cognition     Perception  Praxis      Pertinent Vitals/Pain Pain Assessment: No/denies pain     Hand Dominance Right   Extremity/Trunk Assessment Upper Extremity Assessment Upper Extremity Assessment: Generalized weakness;Difficult to assess due to impaired cognition   Lower Extremity Assessment Lower Extremity Assessment: Defer to PT evaluation   Cervical / Trunk  Assessment Cervical / Trunk Assessment: Normal   Communication Communication Communication: Expressive difficulties   Cognition Arousal/Alertness: Awake/alert Behavior During Therapy: Flat affect Overall Cognitive Status: History of cognitive impairments - at baseline Area of Impairment: Attention;Memory;Following commands;Safety/judgement;Awareness;Problem solving                 Orientation Level:  (not asked orientation questions) Current Attention Level: Focused Memory: Decreased short-term memory Following Commands: Follows one step commands with increased time;Follows one step commands inconsistently Safety/Judgement: Decreased awareness of safety;Decreased awareness of deficits Awareness: Intellectual Problem Solving: Slow processing;Decreased initiation;Difficulty sequencing;Requires verbal cues;Requires tactile cues General Comments: Pt able to do more initiating today than from previous PT note/eval. Pt initiated leg movement to sit EOB. Pt also more verbal giving one and two word responses to questions and expressing likes/dislikes   General Comments  Pt really enjoyed listening to theatre music during session    Exercises     Shoulder Instructions      Home Living Family/patient expects to be discharged to:: Skilled nursing facility                                        Prior Functioning/Environment          Comments: No one present to report assist level needed        OT Problem List: Decreased strength;Decreased activity tolerance;Impaired balance (sitting and/or standing);Decreased cognition;Decreased safety awareness;Decreased knowledge of use of DME or AE      OT Treatment/Interventions: Self-care/ADL training;DME and/or AE instruction;Therapeutic activities;Cognitive remediation/compensation;Patient/family education;Balance training    OT Goals(Current goals can be found in the care plan section) Acute Rehab OT Goals Patient  Stated Goal: did not state OT Goal Formulation: Patient unable to participate in goal setting Time For Goal Achievement: 11/05/16 Potential to Achieve Goals: Good ADL Goals Pt Will Perform Grooming: with min guard assist;sitting;with caregiver independent in assisting Pt Will Perform Upper Body Bathing: with min guard assist;sitting;with caregiver independent in assisting Pt Will Transfer to Toilet: with mod assist;stand pivot transfer;bedside commode Additional ADL Goal #1: Pt will perform bed mobility at min guard assist as precursor to performing ADL Additional ADL Goal #2: Pt will perform sit to stand at min guard assist level for ADL like peri care and dressing  OT Frequency: Min 2X/week   Barriers to D/C:            Co-evaluation              AM-PAC PT "6 Clicks" Daily Activity     Outcome Measure Help from another person eating meals?: Total Help from another person taking care of personal grooming?: A Lot Help from another person toileting, which includes using toliet, bedpan, or urinal?: A Lot Help from another person bathing (including washing, rinsing, drying)?: A Lot Help from another person to put on and taking off regular upper body clothing?: A Lot Help from another person to put on and taking off regular lower body clothing?: Total 6 Click Score: 10   End of Session Equipment Utilized During Treatment: Gait belt Nurse Communication: Mobility  status  Activity Tolerance: Patient tolerated treatment well Patient left: in bed;with call bell/phone within reach;with bed alarm set;with nursing/sitter in room;with SCD's reapplied (external catheter back in place by RN)  OT Visit Diagnosis: Unsteadiness on feet (R26.81);History of falling (Z91.81);Other symptoms and signs involving cognitive function Symptoms and signs involving cognitive functions: Nontraumatic intracerebral hemorrhage                Time: 1354-1421 OT Time Calculation (min): 27 min Charges:  OT  General Charges $OT Visit: 1 Procedure OT Evaluation $OT Eval Moderate Complexity: 1 Procedure OT Treatments $Self Care/Home Management : 8-22 mins G-Codes:     Sherryl Manges OTR/L [phone] Evern Bio Lannie Heaps 10/22/2016, 2:38 PM

## 2016-10-23 DIAGNOSIS — N390 Urinary tract infection, site not specified: Secondary | ICD-10-CM

## 2016-10-23 DIAGNOSIS — B952 Enterococcus as the cause of diseases classified elsewhere: Secondary | ICD-10-CM

## 2016-10-23 LAB — URINE CULTURE: Special Requests: NORMAL

## 2016-10-23 NOTE — Discharge Summary (Addendum)
Name: Ebony [name] MRN: [id number] DOB: Jul 10, 1931 83 y.o. PCP: Florentina Jenny, MD  Date of Admission: 10/20/2016 10:34 AM Date of Discharge: 10/24/2016 Attending Physician: Doneen Poisson, MD  Discharge Diagnosis:  Principal Problem:   Acute cystitis without hematuria Active Problems:   Intracranial hemorrhage (HCC)   Intraventricular hemorrhage (HCC)   History of recent fall   Vascular dementia, uncomplicated   Urinary tract infection due to Enterococcus  Discharge Medications: Allergies as of 10/23/2016      Reactions   Sulfa Antibiotics Diarrhea   Ace Inhibitors    Allergy not noted on MAR   Biaxin [clarithromycin] Diarrhea   Penicillins Diarrhea   Has patient had a PCN reaction causing immediate rash, facial/tongue/throat swelling, SOB or lightheadedness with hypotension: Unknown Has patient had a PCN reaction causing severe rash involving mucus membranes or skin necrosis: Unknown Has patient had a PCN reaction that required hospitalization: Unknown Has patient had a PCN reaction occurring within the last 10 years: Unknown If all of the above answers are "NO", then may proceed with Cephalosporin use.   Klor-con [potassium Chloride] Other (See Comments)   Noted to be an allergy on the [facility]   Cozaar [losartan Potassium] Rash      Medication List    TAKE these medications   acetaminophen 500 MG tablet Commonly known as:  TYLENOL Take 500 mg by mouth 2 (two) times daily.   ARTIFICIAL TEARS 0.1-0.3 % Soln Generic drug:  Dextran 70-Hypromellose Place 1 drop into both eyes every morning.   aspirin 81 MG chewable tablet Chew 81 mg by mouth daily.   BIOFREEZE 4 % Gel Generic drug:  Menthol (Topical Analgesic) Apply 1 application topically every 8 (eight) hours as needed (knee pain).   fluticasone 50 MCG/ACT nasal spray Commonly known as:  FLONASE Place 2 sprays into both nostrils daily.   ibuprofen 200 MG tablet Commonly known as:  ADVIL,MOTRIN Take 200 mg  by mouth every 12 (twelve) hours as needed for moderate pain.   levothyroxine 25 MCG tablet Commonly known as:  SYNTHROID, LEVOTHROID Take 1 tablet (25 mcg total) by mouth daily before breakfast. What changed:  when to take this   lisinopril 20 MG tablet Commonly known as:  PRINIVIL,ZESTRIL Take 40 mg by mouth daily.   Melatonin 3 MG Caps By mouth qhs for sleep What changed:  how much to take  how to take this  when to take this  additional instructions   metoprolol succinate 100 MG 24 hr tablet Commonly known as:  TOPROL-XL Take 150 mg by mouth every morning. for HTN.   MILK OF MAGNESIA 400 MG/5ML suspension Generic drug:  magnesium hydroxide Take 30 mLs by mouth daily as needed for mild constipation.   pantoprazole 20 MG tablet Commonly known as:  PROTONIX Take 20 mg by mouth at bedtime.   Psyllium 500 MG Caps Take 500 mg by mouth at bedtime.   saccharomyces boulardii 250 MG capsule Commonly known as:  FLORASTOR Take 250 mg by mouth 2 (two) times daily.   senna 8.6 MG Tabs tablet Commonly known as:  SENOKOT Take 1 tablet by mouth 3 (three) times a week. Mon. Wed. And Friday for constipation   THERA-M PO Take 1 tablet by mouth every morning.            Discharge Care Instructions        Start     Ordered   10/23/16 0000  Increase activity slowly     10/23/16  1302   10/23/16 0000  Diet - low sodium heart healthy     10/23/16 1302   10/23/16 0000  Discharge instructions    Comments:  Please follow up with PCP regarding your recent hospitalization and treatment of UTI.   10/23/16 1302   10/23/16 0000  Call MD for:  temperature >100.4     10/23/16 1302   10/23/16 0000  Call MD for:  redness, tenderness, or signs of infection (pain, swelling, redness, odor or green/yellow discharge around incision site)     10/23/16 1302   10/23/16 0000  Call MD for:  difficulty breathing, headache or visual disturbances     10/23/16 1302   10/23/16 0000  Call MD  for:  persistant nausea and vomiting     10/23/16 1302      Disposition and follow-up:   [name] was discharged from [facility] [place] condition.  At the hospital follow up visit please address:  1.  [name] arrived to the ED with decreased responsiveness, new onset right sided weakness first noticed at her assisted living facility, and concern for new stroke. The patient's imaging on admission was not consistent with an acute stroke but showed chronic changes consistent with history of falls, remote ischemic CVA, and vascular dementia. The patient was found to have a urinary tract infection, which was thought to be the source of her AMS as her clinical status improved with treatment of this infection. She was found to have a pan-sensitive enterococcus UTI which was treated with 4days of antibiotics (ceftriaxonex2 days, amoxicillinx2 days). If her mental status declines in the future, would recommend getting UA to look for infection.   2.  Labs / imaging needed at time of follow-up: None  3.  Pending labs/ test needing follow-up: Serum MMA  Follow-up Appointments: Contact information for after-discharge care    Destination    [facility] Follow up.   Specialty:  Skilled Nursing Facility Contact information: [address] [place] [address] [phone]              Hospital Course by problem list: Principal Problem:   Acute cystitis without hematuria Active Problems:   Intracranial hemorrhage (HCC)   Intraventricular hemorrhage (HCC)   History of recent fall   Vascular dementia, uncomplicated   Urinary tract infection due to Enterococcus   Ebony [name] is an 83 yo with a PMH of CVA, vascular dementia, HTN, and expressive aphasia who presented to the ED after the patient was found to be have decreased responsiveness and possible right sided weakness. The patient's head imaging on admission revealed  chronic, stable changes that were consistent with previous imaging and no new stroke. Neurology and neurosurgery were consulted and no intervention was recommended. She was admitted to the internal medicine teaching service for management. The specific problems addressed during admission were as follows:  Altered Mental Status: Patient has had multiple episodes of AMS and falls in the recent months. Per chart review she has been to the ED or admitted on at least 7 occasions since 03/2016 for complications with falls or altered mental status. During each admission the patient's mental status improves, but it is unclear whether or not she returns to baseline. Her MOCA during last admission was 9, suggestive of severe cognitive impairment. She has, however, exhibited a significant decrease in her physical functioning throughout all hospitalizations, as evidenced by the fact that she has been seen in the ED for  falls on at least 5 separate occasions since 08/2016 and her head CT/MRI show chronic changes consistent with falling. Her current presentation was most consistent with a UTI, as her urinalysis on admission identified a pan-sensitive enterococcus fecalis. Her creatinine was at baseline of 0.75, not suggesting AKI or dehydration. She was given 4 days of antibiotic therapy, which included ceftriaxone x 2 days, amoxicillin x 2 days. She showed improvement in her mental status. She had no complaints of dysuria or abdominal pain during admission. She was given short course of therapy since she had good clinical response and a longer course could lead to complications such as diarrhea. PT and OT evaluations obtained suggested 24 hours supervision, since the patient has chronic falls and was unable to move in bed/ambulate without assistance. The patient was previously living at [facility] and was discharged to SNF of family's choice per recommendations from PT/OT.   In the future if patient's mental  status changes, would recommend obtaining UA and treating with amoxicillin for hx of pan-sensitive enterococcus infection. The patient's B12 was low-normal on a previous admission and MMA was ordered prior to discharge to ensure her falls did not result from peripheral neuropathy 2/2 B12 deficiency.   Hypertensive Urgency: The patient's BP on arrival >210/100 and records obtained from [facility], show her BP is well managed below goal of 140/80 on metoprolol and lisinopril daily. The patient's AMS on admission prevented the administration of home oral regimen and her BP was managed with PRN labetolol and hydralazine until home regimen could be restarted.   She was discharged with instructions to continue home regimen with the recommendation to add a third agent, such as amlodipine, for BP management if needed as an outpatient. Would avoid diuretic given recent UTI, patient's inability to ambulate safely on her own, and increased likelihood of dehydration with diuretic regimen.  History of paroxysmal A-Fib:Patient rate controlled with metoprolol but not currently on anti-coagulation 2/2 fall risk.   GERD:Home protonix 20mg  daily   Hypothyroidism:Home levothyroxine daily   Discharge Vitals:   BP (!) 180/85 (BP Location: Right Arm)   Pulse 74   Temp 98.2 F (36.8 C) (Oral)   Resp 20   SpO2 97%   Pertinent Labs, Studies, and Procedures:   BMP BMP Latest Ref Rng & Units 10/21/2016 10/20/2016 10/08/2016  Glucose 65 - 99 mg/dL 161(W) 960(A) 540(J)  BUN 6 - 20 mg/dL 10 8 5(L)  Creatinine 8.11 - 1.00 mg/dL 9.14 7.82 9.56  Sodium 135 - 145 mmol/L 133(L) 132(L) 134(L)  Potassium 3.5 - 5.1 mmol/L 3.5 3.9 3.4(L)  Chloride 101 - 111 mmol/L 97(L) 94(L) 98(L)  CO2 22 - 32 mmol/L 24 27 26   Calcium 8.9 - 10.3 mg/dL 8.9 9.4 2.1(H)   CBC CBC Latest Ref Rng & Units 10/20/2016 10/08/2016 10/07/2016  WBC 4.0 - 10.5 K/uL 10.9(H) 9.6 -  Hemoglobin 12.0 - 15.0 g/dL 08.6 57.8 46.9  Hematocrit  36.0 - 46.0 % 38.8 37.6 40.0  Platelets 150 - 400 K/uL 300 254 -   Urinalysis    Component Value Date/Time   COLORURINE AMBER (A) 10/20/2016 1815   APPEARANCEUR CLOUDY (A) 10/20/2016 1815   LABSPEC 1.018 10/20/2016 1815   PHURINE 6.0 10/20/2016 1815   GLUCOSEU NEGATIVE 10/20/2016 1815   HGBUR NEGATIVE 10/20/2016 1815   BILIRUBINUR NEGATIVE 10/20/2016 1815   KETONESUR 20 (A) 10/20/2016 1815   PROTEINUR 100 (A) 10/20/2016 1815   UROBILINOGEN 1.0 04/12/2012 1451   NITRITE NEGATIVE  10/20/2016 1815   LEUKOCYTESUR LARGE (A) 10/20/2016 1815   Microbiology Results   Procedure Component Value Units Date/Time  Urine culture [[id number]] (Abnormal)  Collected: 10/20/16 1815  Order Status: Completed Specimen: Urine from Urine, Catheterized Updated: 10/23/16 0850   Specimen Description URINE, CATHETERIZED   Special Requests Normal   Culture >=100,000 COLONIES/mL ENTEROCOCCUS FAECALIS (A)   Report Status 10/23/2016 FINAL   Organism ID, Bacteria ENTEROCOCCUS FAECALIS (A)  Susceptibility   ENTEROCOCCUS FAECALIS   Antibiotic Sensitivity Microscan Status  AMPICILLIN Sensitive <=2 SENSITIVE Final  Method: MIC  LEVOFLOXACIN Sensitive 1 SENSITIVE Final  Method: MIC  NITROFURANTOIN Sensitive <=16 SENSITIVE Final  Method: MIC  VANCOMYCIN Sensitive 2 SENSITIVE Final  Method: MIC  Comments ENTEROCOCCUS FAECALIS (MIC)   >=100,000 COLONIES/mL ENTEROCOCCUS FAECALIS           CT Head: IMPRESSION: 1. Stable diffuse atrophy and moderate small vessel ischemic change. No acute intracranial abnormality. 2. Vague low-attenuation within the insular ribbon on the left seen on only 1 image and therefore of questionable significance. Consider MR if further assessment is warranted.  MR Brain: IMPRESSION: 1. Small volume of hemorrhage within the occipital horns of lateral ventricles. 2. Poor flow related signal within left transverse and sigmoid sinus may represent sinus thrombosis. Consider CTV  or MRV of the head for further characterization. 3. Stable chronic small left anterior frontal cortical infarct. 4. Stable chronic microvascular ischemic changes and parenchymal volume loss of the brain.  MRV Brain: IMPRESSION: Abnormal signal within the left sigmoid sinus and upper internal jugular vein is likely to represent slow flow with a prominent arachnoid granulation at the transverse sigmoid junction. No dural venous sinus thrombosis is identified.  Discharge Instructions: Discharge Instructions    Call MD for:  difficulty breathing, headache or visual disturbances    Complete by:  As directed    Call MD for:  persistant nausea and vomiting    Complete by:  As directed    Call MD for:  redness, tenderness, or signs of infection (pain, swelling, redness, odor or green/yellow discharge around incision site)    Complete by:  As directed    Call MD for:  temperature >100.4    Complete by:  As directed    Diet - low sodium heart healthy    Complete by:  As directed    Discharge instructions    Complete by:  As directed    Please follow up with PCP regarding your recent hospitalization and treatment of UTI.   Increase activity slowly    Complete by:  As directed       Signed: Rozann Lesches, MD 10/23/2016, 1:03 PM   Pager: [phone]

## 2016-10-23 NOTE — Progress Notes (Signed)
Internal Medicine Attending  Date: 10/23/2016  Patient name: Ebony [name] Medical record number: [id number] Date of birth: May 03, 1931 Age: 83 y.o. Gender: female  I saw and evaluated the patient. I reviewed the resident's note by Dr. Saunders Revel and I agree with the resident's findings and plans as documented in her progress note.  When seen on rounds this morning [name] was even more engaged and interactive than yesterday. I would say she is very close to the baseline that I have seen during the previous 2 admissions. Her urine culture was notable for Enterococcus faecalis and she was immediately switched to Augmentin. I believe that despite being on the ceftriaxone, her clinical response to the antibiotics for the enterococcus has been good and she can be considered to have been given an appropriate course of antibiotics for her symptomatic UTI, especially given her response and always the concerns associated with side effects from antibiotics which we would like to avoid. We will get a blood methylmalonic acid level to follow-up on the borderline vitamin B12 level that was obtained earlier. We are waiting placement in the skilled nursing facility.

## 2016-10-23 NOTE — Progress Notes (Signed)
  Speech Language Pathology Treatment: Dysphagia  Patient Details Name: Ebony [name] MRN: [id number] DOB: [birth year] Today's Date: 10/23/2016 Time: 1030-1314 SLP Time Calculation (min) (ACUTE ONLY): 8 min  Assessment / Plan / Recommendation Clinical Impression  Pt demonstrates further improvement in attention and initiation with PO intake today. Able to self feed and more willing to participate. No signs of aspiration with independent straw sips, able to masticate solids. In prior visits daughters have reported a preference of dys 3 (mech soft) solids, so will continue this diet though pt able to masticate well. Will sign off.   HPI HPI: Ms. Roulhac is an 83 year old woman with a history of hypertension, hyperlipidemia, hypothyroidism, depression, prior cerebrovascular accident, history of subarachnoid hemorrhage and subdural hematoma, and frequent falls currently residing in an assisted living facility who presents with decreased responsiveness from baseline 1 day. Prior episodes of decreased responsiveness were due to polypharmacy, this admit MD suspects UTI.       SLP Plan  All goals met;Discharge SLP treatment due to (comment)       Recommendations  Diet recommendations: Dysphagia 3 (mechanical soft);Thin liquid Liquids provided via: Cup;Straw Medication Administration: Whole meds with puree Supervision: Full supervision/cueing for compensatory strategies Compensations: Slow rate;Small sips/bites Postural Changes and/or Swallow Maneuvers: Seated upright 90 degrees                Oral Care Recommendations: Oral care BID Follow up Recommendations: Skilled Nursing facility SLP Visit Diagnosis: Dysphagia, unspecified (R13.10) Plan: All goals met;Discharge SLP treatment due to (comment)       GO               Herbie Baltimore, MA CCC-SLP [phone]  Lynann Beaver 10/23/2016, 10:17 AM

## 2016-10-23 NOTE — Progress Notes (Signed)
Subjective:  Ebony [name] was seen laying comfortably in bed this morning in no acute distress. She was responsive and interactive during rounds this morning. She states that she feels well and denies abdominal pain, headaches, pain with urination, and chest pain.  Objective:  Vital signs in last 24 hours: Vitals:   10/23/16 0000 10/23/16 0056 10/23/16 0531 10/23/16 0901  BP: (!) 155/69 (!) 155/69 (!) 157/79 (!) 180/85  Pulse:  (!) 59 80 74  Resp:  18 18 20   Temp:  97.7 F (36.5 C) 97.9 F (36.6 C) 98.2 F (36.8 C)  TempSrc:  Oral Oral Oral  SpO2:  94% 95% 97%   Physical Exam  Constitutional:  Thin elderly woman laying comfortably in bed in no acute distress.  Cardiovascular: Normal rate, regular rhythm and intact distal pulses.  Exam reveals no friction rub.   No murmur heard. Pulmonary/Chest: Effort normal and breath sounds normal. No respiratory distress. She has no wheezes.  Abdominal: Soft. She exhibits no distension. There is no tenderness. There is no guarding.  Musculoskeletal: She exhibits tenderness (Slight tenderness with palpation of R Knee). She exhibits no edema (of R or L knees or thighs).  SCD's in place  Neurological:  Patient awake and interactive during exam. Not oriented to interviewer, but pleasantly answers yes or no questions during exam. EOM intact. Smile symmetric. Speech not slurred. Gross movement of upper and lower extremities intact bilaterally.  Skin: Skin is warm and dry. Capillary refill takes less than 2 seconds. No rash noted. No erythema.   Assessment/Plan:  Active Problems:   Intracranial hemorrhage (HCC)   Intraventricular hemorrhage (HCC)   History of recent fall   Vascular dementia, uncomplicated   Acute cystitis without hematuria  Ebony [name] is an 83 yo with a PMH of CVA, vascular dementia, HTN, and expressive aphasia who presented to the ED after the patient was found to be have decreased responsiveness and possible right  sided weakness. The patient's head imaging on admission revealed chronic, stable changes that were consistent with previous imaging and no new stroke. Neurology and neurosurgery were consulted and no intervention was recommended. She was admitted to the internal medicine teaching service for management. The specific problems addressed during admission were as follows:  Altered Mental Status: The patient's mental status continues to improve with treatment of her UTI. She received 2 of 3 planned doses of IV ceftriaxone, but was switched to amoxicillin yesterday as urine culture showed >100,000 pan sensitive enterococcus faecalis. She will still only require 3 days of antibiotic therapy for uncomplicated UTI. Since her mental status improved with treatment of UTI, she is medically stable for discharge to SNF as PT/OT have recommended.  -Continue ASA 81mg  daily for hx of CVA -Continue amoxicillin, 500mg  BID with last dose today. -F/u MMA ordered on 8/22 -Neuro Checks q4h -Medically cleared for discharge pending SNF placement  Hypertensive Urgency: BP on arrival >210/100 and her SBP has been elevated >150 since arrival to the floor. Patient given OK to take oral medications by speech therapy yesterday and home medications restarted on 8/23. Per records from [facility]F [facility], her BP is managed below goal of 140/80 on metoprolol and lisinopril. -Continue hydralazine 5 mg q4 hours PRN for SBP >180 -Continue home lisinopril 40 mg and metoprolol 150 mg qD   History of paroxysmal A-Fib:Patient rate controlled with metoprolol but not currently on anti-coagulation 2/2 fall risk.   GERD:Home protonix 20mg  daily   Hypothyroidism:Home levothyroxine daily  Diet: Follow SLP recommendations for diet, which include medications in a puree  VTE Prophylaxis: SCDs while in bed; no Lovenox 2/2 fall risk  Dispo: Anticipated discharge in approximately 1 day pending SNF placement.   Rozann Lesches, MD 10/23/2016, 9:43 AM Pager: [phone]

## 2016-10-24 DIAGNOSIS — F419 Anxiety disorder, unspecified: Secondary | ICD-10-CM | POA: Diagnosis not present

## 2016-10-24 DIAGNOSIS — R2689 Other abnormalities of gait and mobility: Secondary | ICD-10-CM | POA: Diagnosis not present

## 2016-10-24 DIAGNOSIS — R300 Dysuria: Secondary | ICD-10-CM | POA: Diagnosis not present

## 2016-10-24 DIAGNOSIS — R079 Chest pain, unspecified: Secondary | ICD-10-CM | POA: Diagnosis not present

## 2016-10-24 DIAGNOSIS — R296 Repeated falls: Secondary | ICD-10-CM | POA: Diagnosis not present

## 2016-10-24 DIAGNOSIS — R69 Illness, unspecified: Secondary | ICD-10-CM | POA: Diagnosis not present

## 2016-10-24 DIAGNOSIS — M6281 Muscle weakness (generalized): Secondary | ICD-10-CM | POA: Diagnosis not present

## 2016-10-24 DIAGNOSIS — I615 Nontraumatic intracerebral hemorrhage, intraventricular: Secondary | ICD-10-CM | POA: Diagnosis not present

## 2016-10-24 DIAGNOSIS — E039 Hypothyroidism, unspecified: Secondary | ICD-10-CM | POA: Diagnosis not present

## 2016-10-24 DIAGNOSIS — R3981 Functional urinary incontinence: Secondary | ICD-10-CM | POA: Diagnosis not present

## 2016-10-24 DIAGNOSIS — Z9181 History of falling: Secondary | ICD-10-CM | POA: Diagnosis not present

## 2016-10-24 DIAGNOSIS — R2681 Unsteadiness on feet: Secondary | ICD-10-CM | POA: Diagnosis not present

## 2016-10-24 DIAGNOSIS — I10 Essential (primary) hypertension: Secondary | ICD-10-CM | POA: Diagnosis not present

## 2016-10-24 DIAGNOSIS — R4182 Altered mental status, unspecified: Secondary | ICD-10-CM | POA: Diagnosis not present

## 2016-10-24 DIAGNOSIS — Z5189 Encounter for other specified aftercare: Secondary | ICD-10-CM | POA: Diagnosis not present

## 2016-10-24 DIAGNOSIS — F339 Major depressive disorder, recurrent, unspecified: Secondary | ICD-10-CM | POA: Diagnosis not present

## 2016-10-24 DIAGNOSIS — G464 Cerebellar stroke syndrome: Secondary | ICD-10-CM | POA: Diagnosis not present

## 2016-10-24 DIAGNOSIS — Z7409 Other reduced mobility: Secondary | ICD-10-CM | POA: Diagnosis not present

## 2016-10-24 DIAGNOSIS — I480 Paroxysmal atrial fibrillation: Secondary | ICD-10-CM | POA: Diagnosis not present

## 2016-10-24 DIAGNOSIS — N3010 Interstitial cystitis (chronic) without hematuria: Secondary | ICD-10-CM | POA: Diagnosis not present

## 2016-10-24 NOTE — Care Management Note (Signed)
Case Management Note  Patient Details  Name: Ebony [name] MRN: [id number] Date of Birth: [birth year]  Subjective/Objective:                Patient with order to DC to SNF. Please refer to CSW for dispo planning.     Action/Plan:  DC to SNF.   Expected Discharge Date:  10/26/16               Expected Discharge Plan:  Skilled Nursing Facility  In-House Referral:  Clinical Social Work  Discharge planning Services  CM Consult  Post Acute Care Choice:    Choice offered to:     DME Arranged:    DME Agency:     HH Arranged:    HH Agency:     Status of Service:  In process, will continue to follow  If discussed at Long Length of Stay Meetings, dates discussed:    Additional Comments:  Lawerance Sabal, RN 10/24/2016, 7:43 AM

## 2016-10-24 NOTE — Progress Notes (Signed)
Write off left by weekday CSW informed CSW that pt would be going to [facility]. CSW reached out to [place] in admissions at [facility], however no response. CSW left VM for [place] to contact CSW back regarding pt.   [name], MSW, LCSW-A Emergency Department Clinical Social Worker [phone]

## 2016-10-24 NOTE — Progress Notes (Addendum)
CSW called and scheduled PTAR for 1:30. RN made aware. RN to call report to [phone].   Claude Manges Briseida Gittings, MSW, LCSW-A Emergency Department Clinical Social Worker [phone]

## 2016-10-24 NOTE — Progress Notes (Addendum)
Patient ready for discharge to SNF; report called to receiving RN; patient transported via ambulance; all belongings returned. Discharge information reviewed with patient; no family present at time of discharge.

## 2016-10-24 NOTE — Progress Notes (Addendum)
Subjective:  Ebony [name] was seen laying comfortably in bed this morning in no acute distress. She was feeding herself applesauce and looking at the newspaper during the interview. She complains of pain in her neck after laying in her bed for the past few days and states she would like to move to the chair today. She has no other complaints and states that she has felt well.   Objective:  Vital signs in last 24 hours: Vitals:   10/24/16 0058 10/24/16 0100 10/24/16 0540 10/24/16 1014  BP: (!) 174/78  (!) 180/79 (!) 162/69  Pulse: 71  69 64  Resp: 18  20 18   Temp: 99 F (37.2 C)  99 F (37.2 C) 98.2 F (36.8 C)  TempSrc: Oral  Oral Oral  SpO2: 93% 95% 95% 95%  Weight:      Height:       Physical Exam  Constitutional:  Thin elderly appearing woman sitting comfortably in bed in no acute distress  Cardiovascular: Normal rate, regular rhythm, normal heart sounds and intact distal pulses.   Pulmonary/Chest: Effort normal. No respiratory distress. She has no wheezes.  Abdominal: Soft. Bowel sounds are normal. She exhibits no distension. There is no tenderness. There is no guarding.  Musculoskeletal: She exhibits no edema (of bilateral lower extremities, SCD's in place) or tenderness (of bilateral lower extremities, SCDs in place).  Neurological:  Patient oriented to self, DOB, setting, and city. Not oriented to date or time of year. Patient continues to participate more with examiner and has become increasingly more talkative. She follows commands appropriately. EOM intact. Face strength and sensation intact bilaterally. Gross movement and sensation of upper and lower extremities intact bilaterally.  Skin: Skin is warm and dry. Capillary refill takes less than 2 seconds. No rash noted. No erythema.   Assessment/Plan:  Principal Problem:   Acute cystitis without hematuria Active Problems:   Intracranial hemorrhage (HCC)   Intraventricular hemorrhage (HCC)   History of recent  fall   Vascular dementia, uncomplicated   Urinary tract infection due to Enterococcus  Ebony [name] is an 83 yo with a PMH of CVA, vascular dementia, HTN, and expressive aphasia who presented to the ED after the patient was found to be have decreased responsiveness and possible right sided weakness. She was admitted to the internal medicine teaching service for management. The specific problems addressed during admission were as follows:  Altered Mental Status: The patient's mental status continues to improve with treatment of her UTI. Her urine culture showed >100,000 pan sensitive enterococcus faecalis and she received 3 days of antibiotic therapy. The patient was stable for discharge yesterday but unable to go to SNF overnight, which appears to be because the SNF didn't respond to CSW contacts yesterday. While inpatient she has continued to receive antibiotic therapy for treatment of her UTI, as had one episode of fever to 38.2 overnight that returned to baseline without intervention. She remains asymptomatic from a UTI standpoint and her mental status has improved. Given this, she will likely not require additional treatment after discharge to facility. -Continue ASA 81mg  daily for hx of CVA -Continue amoxicillin, 500mg  BID while inpatient (total duration of antibiotics = 4 days, 2 days of amoxicillin).  -F/u MMA ordered on 8/22 -Neuro Checks q4h -Medically cleared for discharge pending SNF placement  Hypertensive Urgency: BP on arrival >210/100 and her SBP has been elevated >150 since arrival to the floor. Patient has been taking home BP regimen of metoprolol and lisinopril. She is  written for PRN hydralazine to be given for SBP >180 but she has not been given this medication yet per [facility]. Will talk to nursing regarding this issue. -Continue hydralazine 5 mg q4 hours PRN for SBP >180 -Continue home lisinopril 40 mg and metoprolol 150 mg qD   History of paroxysmal A-Fib:Patient rate  controlled with metoprolol but not currently on anti-coagulation 2/2 fall risk.   GERD:Home protonix 20mg  daily   Hypothyroidism:Home levothyroxine daily   Diet: per SLP recommendations  VTE Prophylaxis: SCDs while in bed; no Lovenox 2/2 fall risk  Dispo: Anticipated discharge in approximately today pending transfer to SNF.   Rozann Lesches, MD 10/24/2016, 11:24 AM Pager: [phone]

## 2016-10-24 NOTE — Progress Notes (Addendum)
CSW confirmed that pt is ready to return to [facility]. CSW spoke  with pt's daughter Darl Pikes and informed her that pt would be returning to the facility today. CSW updated RN and is awaiting call from RN to call for PTAR.   Claude Manges Georgeanna Radziewicz, MSW, LCSW-A Emergency Department Clinical Social Worker [phone]

## 2016-10-25 LAB — METHYLMALONIC ACID, SERUM: Methylmalonic Acid, Quantitative: 254 nmol/L (ref 0–378)

## 2016-10-26 DIAGNOSIS — Z7409 Other reduced mobility: Secondary | ICD-10-CM | POA: Diagnosis not present

## 2016-10-26 DIAGNOSIS — I480 Paroxysmal atrial fibrillation: Secondary | ICD-10-CM | POA: Diagnosis not present

## 2016-10-26 DIAGNOSIS — Z9181 History of falling: Secondary | ICD-10-CM | POA: Diagnosis not present

## 2016-10-26 DIAGNOSIS — R69 Illness, unspecified: Secondary | ICD-10-CM | POA: Diagnosis not present

## 2016-10-26 DIAGNOSIS — I10 Essential (primary) hypertension: Secondary | ICD-10-CM | POA: Diagnosis not present

## 2016-10-26 DIAGNOSIS — I615 Nontraumatic intracerebral hemorrhage, intraventricular: Secondary | ICD-10-CM | POA: Diagnosis not present

## 2016-10-28 DIAGNOSIS — Z9181 History of falling: Secondary | ICD-10-CM | POA: Diagnosis not present

## 2016-10-28 DIAGNOSIS — I615 Nontraumatic intracerebral hemorrhage, intraventricular: Secondary | ICD-10-CM | POA: Diagnosis not present

## 2016-10-28 DIAGNOSIS — R69 Illness, unspecified: Secondary | ICD-10-CM | POA: Diagnosis not present

## 2016-10-29 DIAGNOSIS — Z7409 Other reduced mobility: Secondary | ICD-10-CM | POA: Diagnosis not present

## 2016-10-29 DIAGNOSIS — I480 Paroxysmal atrial fibrillation: Secondary | ICD-10-CM | POA: Diagnosis not present

## 2016-10-29 DIAGNOSIS — I10 Essential (primary) hypertension: Secondary | ICD-10-CM | POA: Diagnosis not present

## 2016-10-29 DIAGNOSIS — R69 Illness, unspecified: Secondary | ICD-10-CM | POA: Diagnosis not present

## 2016-11-02 DIAGNOSIS — I10 Essential (primary) hypertension: Secondary | ICD-10-CM | POA: Diagnosis not present

## 2016-11-02 DIAGNOSIS — I480 Paroxysmal atrial fibrillation: Secondary | ICD-10-CM | POA: Diagnosis not present

## 2016-11-02 DIAGNOSIS — R69 Illness, unspecified: Secondary | ICD-10-CM | POA: Diagnosis not present

## 2016-11-02 DIAGNOSIS — E039 Hypothyroidism, unspecified: Secondary | ICD-10-CM | POA: Diagnosis not present

## 2016-11-05 DIAGNOSIS — Z9181 History of falling: Secondary | ICD-10-CM | POA: Diagnosis not present

## 2016-11-05 DIAGNOSIS — I480 Paroxysmal atrial fibrillation: Secondary | ICD-10-CM | POA: Diagnosis not present

## 2016-11-05 DIAGNOSIS — Z7409 Other reduced mobility: Secondary | ICD-10-CM | POA: Diagnosis not present

## 2016-11-05 DIAGNOSIS — I615 Nontraumatic intracerebral hemorrhage, intraventricular: Secondary | ICD-10-CM | POA: Diagnosis not present

## 2016-11-05 DIAGNOSIS — R69 Illness, unspecified: Secondary | ICD-10-CM | POA: Diagnosis not present

## 2016-11-05 DIAGNOSIS — R3981 Functional urinary incontinence: Secondary | ICD-10-CM | POA: Diagnosis not present

## 2016-11-09 DIAGNOSIS — I615 Nontraumatic intracerebral hemorrhage, intraventricular: Secondary | ICD-10-CM | POA: Diagnosis not present

## 2016-11-09 DIAGNOSIS — R69 Illness, unspecified: Secondary | ICD-10-CM | POA: Diagnosis not present

## 2016-11-09 DIAGNOSIS — Z9181 History of falling: Secondary | ICD-10-CM | POA: Diagnosis not present

## 2016-11-12 DIAGNOSIS — R300 Dysuria: Secondary | ICD-10-CM | POA: Diagnosis not present

## 2016-11-12 DIAGNOSIS — R3981 Functional urinary incontinence: Secondary | ICD-10-CM | POA: Diagnosis not present

## 2016-11-12 DIAGNOSIS — Z7409 Other reduced mobility: Secondary | ICD-10-CM | POA: Diagnosis not present

## 2016-11-12 DIAGNOSIS — I480 Paroxysmal atrial fibrillation: Secondary | ICD-10-CM | POA: Diagnosis not present

## 2016-11-13 DIAGNOSIS — R69 Illness, unspecified: Secondary | ICD-10-CM | POA: Diagnosis not present

## 2016-11-13 DIAGNOSIS — Z9181 History of falling: Secondary | ICD-10-CM | POA: Diagnosis not present

## 2016-11-13 DIAGNOSIS — I615 Nontraumatic intracerebral hemorrhage, intraventricular: Secondary | ICD-10-CM | POA: Diagnosis not present

## 2016-11-17 DIAGNOSIS — Z9181 History of falling: Secondary | ICD-10-CM | POA: Diagnosis not present

## 2016-11-17 DIAGNOSIS — I615 Nontraumatic intracerebral hemorrhage, intraventricular: Secondary | ICD-10-CM | POA: Diagnosis not present

## 2016-11-17 DIAGNOSIS — M25551 Pain in right hip: Secondary | ICD-10-CM | POA: Diagnosis not present

## 2016-11-17 DIAGNOSIS — Z7409 Other reduced mobility: Secondary | ICD-10-CM | POA: Diagnosis not present

## 2016-11-17 DIAGNOSIS — R69 Illness, unspecified: Secondary | ICD-10-CM | POA: Diagnosis not present

## 2016-11-20 DIAGNOSIS — R3981 Functional urinary incontinence: Secondary | ICD-10-CM | POA: Diagnosis not present

## 2016-11-20 DIAGNOSIS — R69 Illness, unspecified: Secondary | ICD-10-CM | POA: Diagnosis not present

## 2016-11-20 DIAGNOSIS — M25551 Pain in right hip: Secondary | ICD-10-CM | POA: Diagnosis not present

## 2016-11-20 DIAGNOSIS — Z7409 Other reduced mobility: Secondary | ICD-10-CM | POA: Diagnosis not present

## 2016-11-20 NOTE — Progress Notes (Signed)
Occupational Therapy Evaluation Addendum:    22-Oct-2016 1800  OT G-codes **NOT FOR INPATIENT CLASS**  Functional Assessment Tool Used AM-PAC 6 Clicks Daily Activity  Functional Limitation Self care  Self Care Current Status (Y7829) CL  Self Care Goal Status (F6213) CK  Jeani Hawking, OTR/L [phone]

## 2016-11-23 DIAGNOSIS — R69 Illness, unspecified: Secondary | ICD-10-CM | POA: Diagnosis not present

## 2016-11-23 DIAGNOSIS — Z9181 History of falling: Secondary | ICD-10-CM | POA: Diagnosis not present

## 2016-11-23 DIAGNOSIS — M25551 Pain in right hip: Secondary | ICD-10-CM | POA: Diagnosis not present

## 2016-11-23 DIAGNOSIS — I615 Nontraumatic intracerebral hemorrhage, intraventricular: Secondary | ICD-10-CM | POA: Diagnosis not present

## 2016-11-26 DIAGNOSIS — N390 Urinary tract infection, site not specified: Secondary | ICD-10-CM | POA: Diagnosis not present

## 2016-11-26 DIAGNOSIS — Z7409 Other reduced mobility: Secondary | ICD-10-CM | POA: Diagnosis not present

## 2016-11-26 DIAGNOSIS — M25551 Pain in right hip: Secondary | ICD-10-CM | POA: Diagnosis not present

## 2016-11-26 NOTE — Progress Notes (Signed)
SLP Note (late entry):    10/08/16 1600  SLP G-Codes **NOT FOR INPATIENT CLASS**  Functional Assessment Tool Used skilled clinical judgment  Functional Limitations Swallowing  Swallow Current Status (N6295) CI  Swallow Goal Status (M8413) CI  SLP Evaluations  $ SLP Speech Visit 1 Procedure  SLP Evaluations  $BSS Swallow 1 Procedure   [name], M.A. CCC-SLP [phone]

## 2016-11-26 NOTE — Progress Notes (Signed)
SLP Note (late entry):    10/08/16 1631  SLP G-Codes **NOT FOR INPATIENT CLASS**  Functional Assessment Tool Used skilled clinical judgment  Functional Limitations Spoken language expressive  Spoken Language Expression Current Status (Z6109) CM  Spoken Language Expression Goal Status (U0454) CL  SLP Evaluations  $ SLP Speech Visit 1 Procedure  SLP Evaluations  $ SLP EVAL LANGUAGE/SOUND PRODUCTION 1 Procedure   [name], M.A. CCC-SLP [phone]

## 2016-11-27 DIAGNOSIS — N390 Urinary tract infection, site not specified: Secondary | ICD-10-CM | POA: Diagnosis not present

## 2016-11-27 DIAGNOSIS — R3981 Functional urinary incontinence: Secondary | ICD-10-CM | POA: Diagnosis not present

## 2016-11-27 DIAGNOSIS — Z7409 Other reduced mobility: Secondary | ICD-10-CM | POA: Diagnosis not present

## 2016-11-27 DIAGNOSIS — I480 Paroxysmal atrial fibrillation: Secondary | ICD-10-CM | POA: Diagnosis not present

## 2016-12-04 DIAGNOSIS — I10 Essential (primary) hypertension: Secondary | ICD-10-CM | POA: Diagnosis not present

## 2016-12-04 DIAGNOSIS — Z7409 Other reduced mobility: Secondary | ICD-10-CM | POA: Diagnosis not present

## 2016-12-04 DIAGNOSIS — I480 Paroxysmal atrial fibrillation: Secondary | ICD-10-CM | POA: Diagnosis not present

## 2016-12-04 DIAGNOSIS — M25551 Pain in right hip: Secondary | ICD-10-CM | POA: Diagnosis not present

## 2016-12-15 DIAGNOSIS — N390 Urinary tract infection, site not specified: Secondary | ICD-10-CM | POA: Diagnosis not present

## 2016-12-15 DIAGNOSIS — I10 Essential (primary) hypertension: Secondary | ICD-10-CM | POA: Diagnosis not present

## 2016-12-18 DIAGNOSIS — I480 Paroxysmal atrial fibrillation: Secondary | ICD-10-CM | POA: Diagnosis not present

## 2016-12-18 DIAGNOSIS — R69 Illness, unspecified: Secondary | ICD-10-CM | POA: Diagnosis not present

## 2016-12-18 DIAGNOSIS — Z7409 Other reduced mobility: Secondary | ICD-10-CM | POA: Diagnosis not present

## 2016-12-18 DIAGNOSIS — R3981 Functional urinary incontinence: Secondary | ICD-10-CM | POA: Diagnosis not present

## 2016-12-30 DIAGNOSIS — I480 Paroxysmal atrial fibrillation: Secondary | ICD-10-CM | POA: Diagnosis not present

## 2016-12-30 DIAGNOSIS — I10 Essential (primary) hypertension: Secondary | ICD-10-CM | POA: Diagnosis not present

## 2016-12-30 DIAGNOSIS — K5900 Constipation, unspecified: Secondary | ICD-10-CM | POA: Diagnosis not present

## 2016-12-30 DIAGNOSIS — R69 Illness, unspecified: Secondary | ICD-10-CM | POA: Diagnosis not present

## 2017-01-14 DIAGNOSIS — R69 Illness, unspecified: Secondary | ICD-10-CM | POA: Diagnosis not present

## 2017-01-14 DIAGNOSIS — I480 Paroxysmal atrial fibrillation: Secondary | ICD-10-CM | POA: Diagnosis not present

## 2017-01-14 DIAGNOSIS — N390 Urinary tract infection, site not specified: Secondary | ICD-10-CM | POA: Diagnosis not present

## 2017-01-14 DIAGNOSIS — I10 Essential (primary) hypertension: Secondary | ICD-10-CM | POA: Diagnosis not present

## 2017-01-18 DIAGNOSIS — Z79899 Other long term (current) drug therapy: Secondary | ICD-10-CM | POA: Diagnosis not present

## 2017-01-25 DIAGNOSIS — N390 Urinary tract infection, site not specified: Secondary | ICD-10-CM | POA: Diagnosis not present

## 2017-01-25 DIAGNOSIS — M25562 Pain in left knee: Secondary | ICD-10-CM | POA: Diagnosis not present

## 2017-01-26 DIAGNOSIS — R600 Localized edema: Secondary | ICD-10-CM | POA: Diagnosis not present

## 2017-01-26 DIAGNOSIS — M25562 Pain in left knee: Secondary | ICD-10-CM | POA: Diagnosis not present

## 2017-01-27 DIAGNOSIS — N390 Urinary tract infection, site not specified: Secondary | ICD-10-CM | POA: Diagnosis not present

## 2017-01-27 DIAGNOSIS — M25562 Pain in left knee: Secondary | ICD-10-CM | POA: Diagnosis not present

## 2017-01-28 DIAGNOSIS — R3981 Functional urinary incontinence: Secondary | ICD-10-CM | POA: Diagnosis not present

## 2017-01-28 DIAGNOSIS — M25562 Pain in left knee: Secondary | ICD-10-CM | POA: Diagnosis not present

## 2017-01-28 DIAGNOSIS — R300 Dysuria: Secondary | ICD-10-CM | POA: Diagnosis not present

## 2017-01-28 DIAGNOSIS — N390 Urinary tract infection, site not specified: Secondary | ICD-10-CM | POA: Diagnosis not present

## 2017-02-03 DIAGNOSIS — N390 Urinary tract infection, site not specified: Secondary | ICD-10-CM | POA: Diagnosis not present

## 2017-02-03 DIAGNOSIS — M25562 Pain in left knee: Secondary | ICD-10-CM | POA: Diagnosis not present

## 2017-02-03 DIAGNOSIS — M1712 Unilateral primary osteoarthritis, left knee: Secondary | ICD-10-CM | POA: Diagnosis not present

## 2017-02-06 ENCOUNTER — Emergency Department (HOSPITAL_COMMUNITY): Payer: Medicare HMO

## 2017-02-06 ENCOUNTER — Inpatient Hospital Stay (HOSPITAL_COMMUNITY)
Admission: EM | Admit: 2017-02-06 | Discharge: 2017-02-09 | DRG: 064 | Disposition: A | Discharge status: Skilled Nursing Facility | Payer: Medicare HMO | Attending: Neurology | Admitting: Neurology

## 2017-02-06 ENCOUNTER — Other Ambulatory Visit: Payer: Self-pay

## 2017-02-06 ENCOUNTER — Encounter (HOSPITAL_COMMUNITY): Payer: Self-pay | Admitting: *Deleted

## 2017-02-06 ENCOUNTER — Inpatient Hospital Stay (HOSPITAL_COMMUNITY): Payer: Medicare HMO

## 2017-02-06 DIAGNOSIS — R29719 NIHSS score 19: Secondary | ICD-10-CM | POA: Diagnosis not present

## 2017-02-06 DIAGNOSIS — E039 Hypothyroidism, unspecified: Secondary | ICD-10-CM | POA: Diagnosis present

## 2017-02-06 DIAGNOSIS — R41841 Cognitive communication deficit: Secondary | ICD-10-CM | POA: Diagnosis not present

## 2017-02-06 DIAGNOSIS — S069X0A Unspecified intracranial injury without loss of consciousness, initial encounter: Secondary | ICD-10-CM | POA: Diagnosis not present

## 2017-02-06 DIAGNOSIS — F0390 Unspecified dementia without behavioral disturbance: Secondary | ICD-10-CM | POA: Diagnosis present

## 2017-02-06 DIAGNOSIS — I482 Chronic atrial fibrillation: Secondary | ICD-10-CM | POA: Diagnosis present

## 2017-02-06 DIAGNOSIS — R4182 Altered mental status, unspecified: Secondary | ICD-10-CM | POA: Diagnosis not present

## 2017-02-06 DIAGNOSIS — I10 Essential (primary) hypertension: Secondary | ICD-10-CM | POA: Diagnosis not present

## 2017-02-06 DIAGNOSIS — E785 Hyperlipidemia, unspecified: Secondary | ICD-10-CM | POA: Diagnosis present

## 2017-02-06 DIAGNOSIS — I619 Nontraumatic intracerebral hemorrhage, unspecified: Secondary | ICD-10-CM | POA: Diagnosis not present

## 2017-02-06 DIAGNOSIS — Z8673 Personal history of transient ischemic attack (TIA), and cerebral infarction without residual deficits: Secondary | ICD-10-CM

## 2017-02-06 DIAGNOSIS — R51 Headache: Secondary | ICD-10-CM | POA: Diagnosis not present

## 2017-02-06 DIAGNOSIS — R404 Transient alteration of awareness: Secondary | ICD-10-CM | POA: Diagnosis not present

## 2017-02-06 DIAGNOSIS — Z7982 Long term (current) use of aspirin: Secondary | ICD-10-CM

## 2017-02-06 DIAGNOSIS — R1310 Dysphagia, unspecified: Secondary | ICD-10-CM | POA: Diagnosis present

## 2017-02-06 DIAGNOSIS — R6889 Other general symptoms and signs: Secondary | ICD-10-CM

## 2017-02-06 DIAGNOSIS — G464 Cerebellar stroke syndrome: Secondary | ICD-10-CM | POA: Diagnosis not present

## 2017-02-06 DIAGNOSIS — J690 Pneumonitis due to inhalation of food and vomit: Secondary | ICD-10-CM

## 2017-02-06 DIAGNOSIS — S069X0D Unspecified intracranial injury without loss of consciousness, subsequent encounter: Secondary | ICD-10-CM | POA: Diagnosis not present

## 2017-02-06 DIAGNOSIS — I609 Nontraumatic subarachnoid hemorrhage, unspecified: Secondary | ICD-10-CM | POA: Diagnosis present

## 2017-02-06 DIAGNOSIS — R4701 Aphasia: Secondary | ICD-10-CM | POA: Diagnosis not present

## 2017-02-06 DIAGNOSIS — Z66 Do not resuscitate: Secondary | ICD-10-CM | POA: Diagnosis present

## 2017-02-06 DIAGNOSIS — I69191 Dysphagia following nontraumatic intracerebral hemorrhage: Secondary | ICD-10-CM | POA: Diagnosis not present

## 2017-02-06 DIAGNOSIS — I610 Nontraumatic intracerebral hemorrhage in hemisphere, subcortical: Secondary | ICD-10-CM | POA: Diagnosis not present

## 2017-02-06 DIAGNOSIS — G936 Cerebral edema: Secondary | ICD-10-CM | POA: Diagnosis not present

## 2017-02-06 DIAGNOSIS — I611 Nontraumatic intracerebral hemorrhage in hemisphere, cortical: Principal | ICD-10-CM

## 2017-02-06 DIAGNOSIS — G9340 Encephalopathy, unspecified: Secondary | ICD-10-CM | POA: Diagnosis not present

## 2017-02-06 DIAGNOSIS — R2681 Unsteadiness on feet: Secondary | ICD-10-CM | POA: Diagnosis not present

## 2017-02-06 DIAGNOSIS — R2689 Other abnormalities of gait and mobility: Secondary | ICD-10-CM | POA: Diagnosis not present

## 2017-02-06 DIAGNOSIS — R0600 Dyspnea, unspecified: Secondary | ICD-10-CM | POA: Diagnosis not present

## 2017-02-06 DIAGNOSIS — I4892 Unspecified atrial flutter: Secondary | ICD-10-CM | POA: Diagnosis not present

## 2017-02-06 DIAGNOSIS — M6281 Muscle weakness (generalized): Secondary | ICD-10-CM | POA: Diagnosis not present

## 2017-02-06 DIAGNOSIS — R4020 Unspecified coma: Secondary | ICD-10-CM | POA: Diagnosis not present

## 2017-02-06 DIAGNOSIS — R064 Hyperventilation: Secondary | ICD-10-CM

## 2017-02-06 DIAGNOSIS — I615 Nontraumatic intracerebral hemorrhage, intraventricular: Secondary | ICD-10-CM | POA: Diagnosis not present

## 2017-02-06 DIAGNOSIS — E876 Hypokalemia: Secondary | ICD-10-CM | POA: Diagnosis present

## 2017-02-06 DIAGNOSIS — R030 Elevated blood-pressure reading, without diagnosis of hypertension: Secondary | ICD-10-CM | POA: Diagnosis not present

## 2017-02-06 LAB — DIFFERENTIAL
BASOS ABS: 0 10*3/uL (ref 0.0–0.1)
Basophils Relative: 0 %
EOS ABS: 0 10*3/uL (ref 0.0–0.7)
EOS PCT: 0 %
LYMPHS ABS: 2.3 10*3/uL (ref 0.7–4.0)
LYMPHS PCT: 18 %
MONOS PCT: 8 %
Monocytes Absolute: 1 10*3/uL (ref 0.1–1.0)
NEUTROS PCT: 74 %
Neutro Abs: 9.7 10*3/uL — ABNORMAL HIGH (ref 1.7–7.7)

## 2017-02-06 LAB — I-STAT CHEM 8, ED
BUN: 11 mg/dL (ref 6–20)
CREATININE: 0.7 mg/dL (ref 0.44–1.00)
Calcium, Ion: 1.11 mmol/L — ABNORMAL LOW (ref 1.15–1.40)
Chloride: 96 mmol/L — ABNORMAL LOW (ref 101–111)
GLUCOSE: 115 mg/dL — AB (ref 65–99)
HCT: 40 % (ref 36.0–46.0)
HEMOGLOBIN: 13.6 g/dL (ref 12.0–15.0)
Potassium: 3.8 mmol/L (ref 3.5–5.1)
Sodium: 136 mmol/L (ref 135–145)
TCO2: 28 mmol/L (ref 22–32)

## 2017-02-06 LAB — URINALYSIS, ROUTINE W REFLEX MICROSCOPIC
BILIRUBIN URINE: NEGATIVE
Bacteria, UA: NONE SEEN
GLUCOSE, UA: NEGATIVE mg/dL
HGB URINE DIPSTICK: NEGATIVE
KETONES UR: NEGATIVE mg/dL
LEUKOCYTES UA: NEGATIVE
NITRITE: NEGATIVE
PH: 8 (ref 5.0–8.0)
Protein, ur: 100 mg/dL — AB
SPECIFIC GRAVITY, URINE: 1.012 (ref 1.005–1.030)
SQUAMOUS EPITHELIAL / LPF: NONE SEEN

## 2017-02-06 LAB — RAPID URINE DRUG SCREEN, HOSP PERFORMED
Amphetamines: NOT DETECTED
BARBITURATES: NOT DETECTED
Benzodiazepines: NOT DETECTED
Cocaine: NOT DETECTED
Opiates: NOT DETECTED
TETRAHYDROCANNABINOL: NOT DETECTED

## 2017-02-06 LAB — I-STAT TROPONIN, ED: TROPONIN I, POC: 0.01 ng/mL (ref 0.00–0.08)

## 2017-02-06 LAB — COMPREHENSIVE METABOLIC PANEL
ALBUMIN: 3.9 g/dL (ref 3.5–5.0)
ALK PHOS: 88 U/L (ref 38–126)
ALT: 12 U/L — ABNORMAL LOW (ref 14–54)
AST: 22 U/L (ref 15–41)
Anion gap: 11 (ref 5–15)
BILIRUBIN TOTAL: 0.7 mg/dL (ref 0.3–1.2)
BUN: 10 mg/dL (ref 6–20)
CALCIUM: 9.2 mg/dL (ref 8.9–10.3)
CO2: 25 mmol/L (ref 22–32)
Chloride: 96 mmol/L — ABNORMAL LOW (ref 101–111)
Creatinine, Ser: 0.76 mg/dL (ref 0.44–1.00)
GFR calc Af Amer: >60 mL/min (ref >60)
GLUCOSE: 120 mg/dL — AB (ref 65–99)
POTASSIUM: 3.7 mmol/L (ref 3.5–5.1)
Sodium: 132 mmol/L — ABNORMAL LOW (ref 135–145)
TOTAL PROTEIN: 7.1 g/dL (ref 6.5–8.1)

## 2017-02-06 LAB — CBC
HEMATOCRIT: 39.3 % (ref 36.0–46.0)
HEMOGLOBIN: 13 g/dL (ref 12.0–15.0)
MCH: 29.8 pg (ref 26.0–34.0)
MCHC: 33.1 g/dL (ref 30.0–36.0)
MCV: 90.1 fL (ref 78.0–100.0)
Platelets: 231 10*3/uL (ref 150–400)
RBC: 4.36 MIL/uL (ref 3.87–5.11)
RDW: 14.4 % (ref 11.5–15.5)
WBC: 13.1 10*3/uL — ABNORMAL HIGH (ref 4.0–10.5)

## 2017-02-06 LAB — APTT: aPTT: 31 seconds (ref 24–36)

## 2017-02-06 LAB — MRSA PCR SCREENING: MRSA BY PCR: NEGATIVE

## 2017-02-06 LAB — PROTIME-INR
INR: 0.97
Prothrombin Time: 12.8 seconds (ref 11.4–15.2)

## 2017-02-06 LAB — ETHANOL: Alcohol, Ethyl (B): <10 mg/dL (ref <10)

## 2017-02-06 MED ORDER — ACETAMINOPHEN 650 MG RE SUPP
650.0000 mg | RECTAL | Status: DC | PRN
  Administered 2017-02-06 – 2017-02-08 (×2): 650 mg via RECTAL
  Filled 2017-02-06 (×2): qty 1

## 2017-02-06 MED ORDER — PANTOPRAZOLE SODIUM 40 MG IV SOLR
40.0000 mg | Freq: Every day | INTRAVENOUS | Status: DC
  Administered 2017-02-06 – 2017-02-07 (×2): 40 mg via INTRAVENOUS
  Filled 2017-02-06 (×2): qty 40

## 2017-02-06 MED ORDER — SENNOSIDES-DOCUSATE SODIUM 8.6-50 MG PO TABS
1.0000 | ORAL_TABLET | Freq: Two times a day (BID) | ORAL | Status: DC
Start: 2017-02-06 — End: 2017-02-09
  Administered 2017-02-07 – 2017-02-09 (×4): 1 via ORAL
  Filled 2017-02-06 (×4): qty 1

## 2017-02-06 MED ORDER — NICARDIPINE HCL IN NACL 20-0.86 MG/200ML-% IV SOLN
0.0000 mg/h | INTRAVENOUS | Status: DC
  Administered 2017-02-06 (×2): 5 mg/h via INTRAVENOUS
  Administered 2017-02-07: 7.5 mg/h via INTRAVENOUS
  Administered 2017-02-07: 5 mg/h via INTRAVENOUS
  Administered 2017-02-07: 3 mg/h via INTRAVENOUS
  Administered 2017-02-07: 4 mg/h via INTRAVENOUS
  Administered 2017-02-07: 10 mg/h via INTRAVENOUS
  Administered 2017-02-07: 5 mg/h via INTRAVENOUS
  Administered 2017-02-07: 7.5 mg/h via INTRAVENOUS
  Filled 2017-02-06 (×11): qty 200

## 2017-02-06 MED ORDER — STROKE: EARLY STAGES OF RECOVERY BOOK
Freq: Once | Status: AC
  Administered 2017-02-07: 1
  Filled 2017-02-06: qty 1

## 2017-02-06 MED ORDER — ONDANSETRON HCL 4 MG/2ML IJ SOLN
4.0000 mg | Freq: Once | INTRAMUSCULAR | Status: AC
  Administered 2017-02-06: 4 mg via INTRAVENOUS
  Filled 2017-02-06: qty 2

## 2017-02-06 MED ORDER — IOPAMIDOL (ISOVUE-300) INJECTION 61%
INTRAVENOUS | Status: AC
  Filled 2017-02-06: qty 100

## 2017-02-06 MED ORDER — ORAL CARE MOUTH RINSE
15.0000 mL | Freq: Two times a day (BID) | OROMUCOSAL | Status: DC
  Administered 2017-02-06 – 2017-02-09 (×6): 15 mL via OROMUCOSAL

## 2017-02-06 NOTE — ED Notes (Signed)
3rd bag of Cardene hung at this time.  Pt's BP remains within parameters and pt is resting quietly.  NAD noted.  Daughter Darl PikesSusan is at bedside and reports that she is leaving for home at this time.  ALL belongings of pt's are taken home with daughter.

## 2017-02-06 NOTE — ED Triage Notes (Signed)
Pt here via [facility] from [facility].  Normally alert, ambulates and converses.  LSN last night at 11 pm.  Staff came in at 9 am and pt could only say her name and pt cannot follow commands. BP 230/120, hr afib 100, rr16, O2 95%.  cbg 150.

## 2017-02-06 NOTE — ED Provider Notes (Signed)
[facility] EMERGENCY DEPARTMENT Provider Note   CSN: [id number] Arrival date & time: 02/06/17  1000  History   Chief Complaint Chief Complaint  Patient presents with  . Altered Mental Status   HPI  Level 5 Caveat   Ebony [name] is a 83 y.o. female with a PMHx significant for A-fib, CVA, subarachnoid hemorrhage, and severe dementia who presented to the ED from [facility] for altered mental status. She was last seen normal at 11 pm on 12/7. She is typically talkative and mobile however, this morning she was found to be minimally responsive and weak.   Patient is currently not talking or answering questions. She does follow simple commands with some delay.   Past Medical History:  Diagnosis Date  . Acute ischemic stroke (HCC) 04/11/2012  . Allergic rhinitis   . Anxiety   . Arthritis    Knees  . Dementia   . Depression   . Fibrocystic breast disease   . Hiatal hernia   . Hyperlipidemia   . Hypertension   . IFG (impaired fasting glucose)   . SDH (subdural hematoma) (HCC) 05/16/2011  . Subarachnoid hemorrhage following injury (HCC) 05/16/2011  . Subarachnoid hemorrhage following injury (HCC) 05/16/2011  . Unspecified hypothyroidism 04/15/2012   Patient Active Problem List   Diagnosis Date Noted  . Urinary tract infection due to Enterococcus   . Intraventricular hemorrhage (HCC)   . History of recent fall   . Vascular dementia, uncomplicated   . Acute cystitis without hematuria   . Intracranial hemorrhage (HCC) 10/20/2016  . Hyponatremia 10/08/2016  . Hypertensive urgency 10/08/2016  . Polypharmacy   . Paroxysmal supraventricular tachycardia (HCC)   . Recurrent falls 09/04/2016  . Maxillary sinus fracture (HCC) 09/04/2016  . Closed fracture of nasal bone 09/04/2016  . Acute encephalopathy   . Expressive aphasia   . History of TIA (transient ischemic attack) and stroke 06/24/2016  . Depression with anxiety 06/24/2016  . Hypothyroid 06/24/2016  .  GERD (gastroesophageal reflux disease) 06/24/2016  . Allergic rhinitis 06/08/2014  . DDD (degenerative disc disease), lumbar 06/08/2014  . Insomnia 12/07/2013  . Depression   . OA (osteoarthritis) 01/06/2013  . Loss of weight 07/13/2012  . Vascular dementia without behavioral disturbance 06/20/2012  . Hypothyroidism due to acquired atrophy of thyroid 04/15/2012  . Essential hypertension 05/16/2011  . Anxiety disorder 05/16/2011   Past Surgical History:  Procedure Laterality Date  . ABDOMINAL HYSTERECTOMY    . BREAST SURGERY    . EYE SURGERY    . TONSILLECTOMY     OB History    No data available     Home Medications    Prior to Admission medications   Medication Sig Start Date End Date Taking? Authorizing Provider  acetaminophen (TYLENOL) 500 MG tablet Take 1,000 mg by mouth 3 (three) times daily.    Yes [provider]  aspirin 81 MG chewable tablet Chew 81 mg by mouth daily.   Yes [provider]  carboxymethylcellulose (REFRESH PLUS) 0.5 % SOLN Place 1 drop into both eyes daily.   Yes [provider]  fluticasone (FLONASE) 50 MCG/ACT nasal spray Place 2 sprays into both nostrils daily. 06/08/14  Yes Sharon SellerEubanks, Jessica K, NP  ibuprofen (ADVIL,MOTRIN) 200 MG tablet Take 200 mg by mouth every 12 (twelve) hours as needed for moderate pain.   Yes [provider]  levothyroxine (SYNTHROID, LEVOTHROID) 25 MCG tablet Take 1 tablet (25 mcg total) by mouth daily before breakfast. 04/15/12  Yes Christiane Ha, MD  lisinopril (PRINIVIL,ZESTRIL) 40 MG tablet Take 40 mg by mouth daily.  10/01/16  Yes [provider]  Melatonin 3 MG CAPS By mouth qhs for sleep Patient taking differently: Take 3 mg by mouth at bedtime.  11/17/13  Yes Sharon Seller, NP  Menthol, Topical Analgesic, (BIOFREEZE) 4 % GEL Apply 1 application topically every 8 (eight) hours as needed (knee pain).   Yes [provider]  metoprolol (TOPROL-XL) 100 MG 24 hr tablet  Take 150 mg by mouth every morning. for HTN.   Yes [provider]  mirtazapine (REMERON) 15 MG tablet Take 15 mg by mouth at bedtime.   Yes [provider]  Multiple Vitamins-Minerals (THERA-M PO) Take 1 tablet by mouth every morning.   Yes [provider]  pantoprazole (PROTONIX) 20 MG tablet Take 20 mg by mouth every morning.    Yes [provider]  polyethylene glycol (MIRALAX / GLYCOLAX) packet Take 17 g by mouth daily.   Yes [provider]  saccharomyces boulardii (FLORASTOR) 250 MG capsule Take 250 mg by mouth 2 (two) times daily.   Yes [provider]  traMADol (ULTRAM) 50 MG tablet Take 25 mg by mouth 2 (two) times daily as needed for moderate pain.   Yes [provider]  magnesium hydroxide (MILK OF MAGNESIA) 400 MG/5ML suspension Take 30 mLs by mouth daily as needed for mild constipation.    [provider]  senna (SENOKOT) 8.6 MG TABS tablet Take 2 tablets by mouth daily as needed for mild constipation. Mon. Wed. And Friday for constipation     [provider]   Family History Family History  Problem Relation Age of Onset  . Lung cancer Sister   . CVA Neg Hx   . Diabetes Neg Hx   . Hypertension Neg Hx    Social History Social History   Tobacco Use  . Smoking status: Never Smoker  . Smokeless tobacco: Never Used  Substance Use Topics  . Alcohol use: No  . Drug use: No   Allergies   Sulfa antibiotics; Ace inhibitors; Biaxin [clarithromycin]; Penicillins; Klor-con [potassium chloride]; and Cozaar [losartan potassium]  Review of Systems  Unable to obtain due to the patient's current cognitive status. Altered mental status and non verbal.   Physical Exam Updated Vital Signs BP (!) 135/57   Pulse 80   Temp 98.5 F (36.9 C) (Oral)   Resp 16   Wt 61.2 kg (135 lb)   SpO2 93%   BMI 22.47 kg/m   General: Thin female in no acute distress HENT: PERRLA, normocephalic, atraumatic  Pulm:  Good air movement with no wheezing or crackles  CV: Irregular rhythm, no murmurs or rubs appreciated Abdomen: Active bowel sounds, soft, non-distended, no tenderness to palpation  Extremities: No LE edema, pulses palpable  Skin: Warm and dry  Neuro: Limited due to the patient's current mental status and limited participation. Will follow simple commands with delay. Non-verbal. Grip strength 5/5 bilaterally. Able to lift her arms and legs bilaterally. Will wiggle her toes.   ED Treatments / Results  Labs (all labs ordered are listed, but only abnormal results are displayed) Labs Reviewed  CBC - Abnormal; Notable for the following components:      Result Value   WBC 13.1 (*)    All other components within normal limits  DIFFERENTIAL - Abnormal; Notable for the following components:   Neutro Abs 9.7 (*)    All other components  within normal limits  COMPREHENSIVE METABOLIC PANEL - Abnormal; Notable for the following components:   Sodium 132 (*)    Chloride 96 (*)    Glucose, Bld 120 (*)    ALT 12 (*)    All other components within normal limits  URINALYSIS, ROUTINE W REFLEX MICROSCOPIC - Abnormal; Notable for the following components:   Protein, ur 100 (*)    All other components within normal limits  I-STAT CHEM 8, ED - Abnormal; Notable for the following components:   Chloride 96 (*)    Glucose, Bld 115 (*)    Calcium, Ion 1.11 (*)    All other components within normal limits  ETHANOL  PROTIME-INR  APTT  RAPID URINE DRUG SCREEN, HOSP PERFORMED  I-STAT TROPONIN, ED   EKG  EKG Interpretation  Date/Time:  Saturday February 06 2017 10:13:45 EST Ventricular Rate:  75 PR Interval:    QRS Duration: 90 QT Interval:  450 QTC Calculation: 503 R Axis:   56 Text Interpretation:  Atrial flutter with predominant 4:1 AV block Consider left ventricular hypertrophy Anterior Q waves, possibly due to LVH Prolonged QT interval No significant change since last tracing Confirmed by Richardean Canal [phone]) on 02/06/2017 10:31:00 AM Also confirmed by Richardean Canal [id number]), editor Barbette Hair [phone])  on 02/06/2017 11:47:03 AM      Radiology Ct Head Wo Contrast  Result Date: 02/06/2017 CLINICAL DATA:  Altered mental status. Pt only alert to self. No injury. Pt not responsive to staff. Hx: SAH, SDH, HTN, Stroke, Dementia EXAM: CT HEAD WITHOUT CONTRAST TECHNIQUE: Contiguous axial images were obtained from the base of the skull through the vertex without intravenous contrast. COMPARISON:  10/20/2016 FINDINGS: Brain: There is a large acute left frontal parenchymal hemorrhage measuring 4.8 x 3.5 x 3.3 cm. There is surrounding vasogenic edema. A small amount of hemorrhage has extended into the adjacent subarachnoid space. Mass effect from the hemorrhage is noted with effacement of adjacent sulci and midline shift to the right of 4 mm. The ventricles are enlarged reflecting atrophy, stable from the prior study. No evidence hydrocephalus. No other mass effect. No extra-axial masses. Vascular: No hyperdense vessel or unexpected calcification. Skull: Normal. Negative for fracture or focal lesion. Sinuses/Orbits: Globes and orbits are unremarkable. Visualized sinuses and mastoid air cells are clear. Other: None. IMPRESSION: 1. Large acute left frontal parenchymal hemorrhage with a small amount of adjacent subarachnoid hemorrhage. There is associated mass effect and mild midline shift to the right. Critical Value/emergent results were called by telephone at the time of interpretation on 02/06/2017 at 11:14 am to Dr. Andreas Blower PA, Casimiro Needle, who verbally acknowledged these results. 2. No other acute intracranial abnormality. Electronically Signed   By: Amie Portland M.D.   On: 02/06/2017 11:17   Dg Chest Port 1 View  Result Date: 02/06/2017 CLINICAL DATA:  Altered mental status EXAM: PORTABLE CHEST 1 VIEW COMPARISON:  None. FINDINGS: Normal mediastinum and cardiac silhouette. Normal pulmonary vasculature. No  evidence of effusion, infiltrate, or pneumothorax. No acute bony abnormality. IMPRESSION: No acute cardiopulmonary process. Electronically Signed   By: Genevive Bi M.D.   On: 02/06/2017 10:53   Procedures Procedures (including critical care time)  Medications Ordered in ED Medications  nicardipine (CARDENE) 20mg  in 0.86% saline IV infusion (0.1 mg/ml) (10 mg/hr Intravenous Rate/Dose Change 02/06/17 1317)   Initial Impression / Assessment and Plan / ED Course  I have reviewed the triage vital signs and the nursing notes.  Pertinent labs &  imaging results that were available during my care of the patient were reviewed by me and considered in my medical decision making (see chart for details).    CT head showing a large acute left frontal parenchymal hemorrhage with a small amount of adjacent subarachnoid hemorrhage. There is associated mass effect and mild midline shift to the right. Patient started on a Nicardipine drip (goal SBP <140) and neurosurgery consulted. Neurosurgery does not feel there is anything to be done surgically, recommended consulting Neurology. Neurology will see and admit the patient.   Patient is not on anticoagulation.   Spoke with family. Patient is a DNR/DNI but they due want to pursue aggressive measures if needed.   Final Clinical Impressions(s) / ED Diagnoses   Final diagnoses:  Left-sided nontraumatic intracerebral hemorrhage, unspecified cerebral location [facility](HCC)   ED Discharge Orders    None       Levora DredgeHelberg, Kischa Altice, MD 02/06/17 1402    Charlynne PanderYao, David Hsienta, MD 02/07/17 [phone]

## 2017-02-06 NOTE — H&P (Signed)
Seen and examined patient at 2 pm. Documentation reflects examination at that time.        Chief[place] Z610TeSolicitorsee SouHistory obtained [id number] Contr9[place] ute ischemic stroke (HCC) 04/11/2012  . Allergic rhinitis   . Anxiety   . Arthritis    Knees  . Dementia   . Depression   . Fibrocystic breast disease   . Hiatal hernia   . Hyperlipidemia   . Hypertension   . IFG (impaired fasting glucose)   . SDH (subdural hematoma) (HCC) 05/16/2011  . Subarachnoid hemorrhage following injury (HCC) 05/16/2011  . Subarachnoid hemorrhage following injury (HCC) 05/16/2011  . Unspecified hypothyroidism 04/15/2012    Past Surgical History:  Procedure Laterality Date  . ABDOMINAL HYSTERECTOMY    . BREAST SURGERY    . EYE SURGERY    . TONSILLECTOMY      Family History  Problem Relation Age of Onset  . Lung cancer Sister   . CVA Neg Hx   . Diabetes Neg Hx   . Hypertension Neg Hx    Social History:   reports that  has never smoked. she has never used smokeless tobacco. She reports that she does not drink alcohol or use drugs.  Allergies:  Allergies  Allergen Reactions  . Sulfa Antibiotics Diarrhea  . Ace Inhibitors     Allergy not noted on MAR  . Biaxin [Clarithromycin] Diarrhea  . Penicillins Diarrhea    Has patient had a PCN reaction causing immediate rash, facial/tongue/throat swelling, SOB or lightheadedness with hypotension: Unknown Has patient had a PCN reaction causing severe rash involving mucus membranes or skin necrosis: Unknown Has patient had a PCN reaction that required hospitalization: Unknown Has patient had a PCN reaction occurring within the last 10  years: Unknown If all of the above answers are "NO", then may proceed with Cephalosporin use.   Marland Kitchen Klor-Con [Potassium Chloride] Other (See Comments)    Noted to be an allergy on the MAR  . Cozaar [Losartan Potassium] Rash    Medications:                                                                                                                        I reviewed home medications- not anticoagulate   ROS:                                                                                                                                     Able to obtain due to altered mental status   Examination:                                                                                                      General: Appears well-developed and well-nourished.  Psych: Affect appropriate to situation Eyes: No scleral injection HENT: No OP obstrucion Head: Normocephalic.  Cardiovascular: Normal rate and regular rhythm.  Respiratory: Effort normal and breath sounds normal to anterior ascultation GI: Soft.  No distension. There is no tenderness.  Skin: WDI   Neurological Examination Mental Status: Patient opens eyes spontaneously however nonverbal and not following any commands Cranial Nerves: Pupils equal and reactive 3  mm., Face symmetric, tongue midline Motor: Withdraws with good strength all 4 extremities Tone and bulk:normal tone throughout; no atrophy noted Sensory: Withdraws to noxious stimuli both sides equally Deep Tendon Reflexes: 1+ and symmetric throughout Plantars: Right: downgoing   Left: downgoing Cerebellar: Unable to assess Gait: Unable to assess    Lab Results: Basic Metabolic Panel: Recent Labs  Lab 02/06/17 1016 02/06/17 1030  NA 132* 136  K 3.7 3.8  CL 96* 96*  CO2 25  --   GLUCOSE 120* 115*  BUN 10 11  CREATININE 0.76 0.70  CALCIUM 9.2  --  CBC: Recent Labs  Lab 02/06/17 1016 02/06/17 1030  WBC 13.1*  --   NEUTROABS 9.7*  --   HGB 13.0 13.6  HCT 39.3 40.0  MCV 90.1  --   PLT 231  --     Coagulation Studies: Recent Labs    02/06/17 1016  LABPROT 12.8  INR 0.97    Imaging: Ct Head Wo Contrast  Result Date: 02/06/2017 CLINICAL DATA:  Intracranial hemorrhage EXAM: CT HEAD WITHOUT CONTRAST TECHNIQUE: Contiguous axial images were obtained from the base of the skull through the vertex without intravenous contrast. COMPARISON:  Study obtained earlier in the day FINDINGS: Brain: Moderate diffuse atrophy is stable. The previously noted hemorrhage in the left frontal region measures 4.6 x 3.5 x 3.4 cm, essentially stable. There is mild surrounding edema which appears stable. A modest amount of adjacent subarachnoid hemorrhage is noted along the posterior-lateral aspect of this left frontal hemorrhage. No new foci of hemorrhage are evident. The degree of mass effect from this hemorrhage in the left frontal region is stable with effacement along the anterolateral left frontal horn left lateral ventricle, stable. There is no midline shift. There is no well-defined mass. There is no subdural or epidural fluid. Elsewhere there is patchy small vessel disease throughout the centra semiovale bilaterally. No new gray-white compartment lesion compared to earlier in the day.  Vascular: No hyperdense vessel evident. There is atherosclerotic calcification in the carotid siphon regions bilaterally. Skull: The bony calvarium appears intact. Sinuses/Orbits: There is an apparent retention cyst in a posterior left ethmoid air cell. No new paranasal sinus opacity in visualized paranasal sinuses. Orbits appear symmetric bilaterally. Other: Mastoid air cells are clear. IMPRESSION: 1. Medial left frontal parenchymal hemorrhage with mild surrounding edema appears stable compared to earlier in the day. The degree of surrounding mass effect is stable. A small amount of adjacent subarachnoid hemorrhage is stable. No new hemorrhage evident. No mass or extra-axial fluid collection. No midline shift. There is underlying atrophy with patchy periventricular small vessel disease, stable. 2. There are foci of arterial vascular calcification. There is mild posterior left ethmoid air cell paranasal sinus disease. Electronically Signed   By: Bretta BangWilliam  Woodruff III M.D.   On: 02/06/2017 19:02   Ct Head Wo Contrast  Result Date: 02/06/2017 CLINICAL DATA:  Altered mental status. Pt only alert to self. No injury. Pt not responsive to staff. Hx: SAH, SDH, HTN, Stroke, Dementia EXAM: CT HEAD WITHOUT CONTRAST TECHNIQUE: Contiguous axial images were obtained from the base of the skull through the vertex without intravenous contrast. COMPARISON:  10/20/2016 FINDINGS: Brain: There is a large acute left frontal parenchymal hemorrhage measuring 4.8 x 3.5 x 3.3 cm. There is surrounding vasogenic edema. A small amount of hemorrhage has extended into the adjacent subarachnoid space. Mass effect from the hemorrhage is noted with effacement of adjacent sulci and midline shift to the right of 4 mm. The ventricles are enlarged reflecting atrophy, stable from the prior study. No evidence hydrocephalus. No other mass effect. No extra-axial masses. Vascular: No hyperdense vessel or unexpected calcification. Skull: Normal. Negative  for fracture or focal lesion. Sinuses/Orbits: Globes and orbits are unremarkable. Visualized sinuses and mastoid air cells are clear. Other: None. IMPRESSION: 1. Large acute left frontal parenchymal hemorrhage with a small amount of adjacent subarachnoid hemorrhage. There is associated mass effect and mild midline shift to the right. Critical Value/emergent results were called by telephone at the time of interpretation on 02/06/2017 at 11:14 am to Dr. Andreas BlowerAVID YAO'S PA,  Casimiro Needle, who verbally acknowledged these results. 2. No other acute intracranial abnormality. Electronically Signed   By: Amie Portland M.D.   On: 02/06/2017 11:17   Dg Chest Port 1 View  Result Date: 02/06/2017 CLINICAL DATA:  Altered mental status EXAM: PORTABLE CHEST 1 VIEW COMPARISON:  None. FINDINGS: Normal mediastinum and cardiac silhouette. Normal pulmonary vasculature. No evidence of effusion, infiltrate, or pneumothorax. No acute bony abnormality. IMPRESSION: No acute cardiopulmonary process. Electronically Signed   By: Genevive Bi M.D.   On: 02/06/2017 10:53     ASSESSMENT AND PLAN  Left frontal hemorrhage Abulia Cytotoxic and vasogenic Cerebral edema  Plan Admit to ICU as patient is on Cardene drip, no plan to intubate this patient is DNR/DNI Goal BPs less than 140 systolic Repeat CT head in 6 hours No antiplatelets PT/OT eval SCDs for DVT prophylaxis NPO until passes swallow study   Tamario Heal [facility] Pager Number [phone]

## 2017-02-06 NOTE — ED Notes (Signed)
Silverio LayYao, MD notified re: no speech and unable to follow commands, orders placed

## 2017-02-06 NOTE — ED Notes (Signed)
Pt nauseous, HOB raised and suction available, Yao, Md notified, verbal order to admin Zofran IV, will give med, lower HOB, & monitor pt

## 2017-02-07 ENCOUNTER — Inpatient Hospital Stay (HOSPITAL_COMMUNITY): Payer: Medicare HMO

## 2017-02-07 DIAGNOSIS — I610 Nontraumatic intracerebral hemorrhage in hemisphere, subcortical: Secondary | ICD-10-CM

## 2017-02-07 MED ORDER — METOPROLOL TARTRATE 25 MG PO TABS
12.5000 mg | ORAL_TABLET | Freq: Four times a day (QID) | ORAL | Status: DC
  Administered 2017-02-08 – 2017-02-09 (×5): 12.5 mg via ORAL
  Filled 2017-02-07 (×6): qty 1

## 2017-02-07 MED ORDER — PIPERACILLIN-TAZOBACTAM 3.375 G IVPB
3.3750 g | Freq: Three times a day (TID) | INTRAVENOUS | Status: DC
  Administered 2017-02-07 – 2017-02-09 (×6): 3.375 g via INTRAVENOUS
  Filled 2017-02-07 (×7): qty 50

## 2017-02-07 MED ORDER — SODIUM CHLORIDE 0.9 % IV SOLN
INTRAVENOUS | Status: DC
  Administered 2017-02-07 – 2017-02-08 (×2): via INTRAVENOUS

## 2017-02-07 MED ORDER — METOPROLOL TARTRATE 5 MG/5ML IV SOLN
2.5000 mg | Freq: Once | INTRAVENOUS | Status: AC
  Administered 2017-02-07: 2.5 mg via INTRAVENOUS
  Filled 2017-02-07: qty 5

## 2017-02-07 NOTE — Progress Notes (Signed)
Patient has had 3 episodes in the last 24 hours of worsened tachycardia to 140's with SBP to 150's and "pursed lip breathing" per RN. The episodes last about 2 minutes and are not associated with desaturations. The patient has atrial fibrillation and HTN with BPs currently managed with nicardipine drip. She is on Toprol XL 150 mg qd at home, but not currently on a beta blocker this admission.   Discussed with Cardiology on call. Plan is to restart her beta blocker with an immediate release oral form (metoprolol tartrate 12.5 mg q6h) and monitor for resolution of the episodes.   Appreciate Cardiology Fellow's input.   Electronically signed: Dr. Caryl PinaEric Colman Birdwell

## 2017-02-07 NOTE — Evaluation (Signed)
Clinical/Bedside Swallow Evaluation Patient Details  Name: Ebony [name] MRN: [id number] Date of Birth: 09/25/1931  Today's Date: 02/07/2017 Time: SLP Start Time (ACUTE ONLY): 0815 SLP Stop Time (ACUTE ONLY): 0830 SLP Time Calculation (min) (ACUTE ONLY): 15 min  Past Medical History:  Past Medical History:  Diagnosis Date  . Acute ischemic stroke (HCC) 04/11/2012  . Allergic rhinitis   . Anxiety   . Arthritis    Knees  . Dementia   . Depression   . Fibrocystic breast disease   . Hiatal hernia   . Hyperlipidemia   . Hypertension   . IFG (impaired fasting glucose)   . SDH (subdural hematoma) (HCC) 05/16/2011  . Subarachnoid hemorrhage following injury (HCC) 05/16/2011  . Subarachnoid hemorrhage following injury (HCC) 05/16/2011  . Unspecified hypothyroidism 04/15/2012   Past Surgical History:  Past Surgical History:  Procedure Laterality Date  . ABDOMINAL HYSTERECTOMY    . BREAST SURGERY    . EYE SURGERY    . TONSILLECTOMY     HPI:  Ebony Sheriffancy C Humphreyis an 83 y.o.femalewithPMH A-fib not on anticoagulation due to prior hemorrhage, CVA, subdural andsubarachnoid hemorrhage, and severe dementia who [facility] for [facility] mental status. CT head showed a 5x3x3 cm left frontal hemorrhage with mass effect and minimal midline shift. She had a speech/language evaluation in April 2018 with signs of an expressive aphasia and concern for PPA.SLP evaluated and followed briefly for diet tolerance during prior admission; discharged on mechanical soft (preferred per daughters), thin liquids.   Assessment / Plan / Recommendation Clinical Impression  Pt is fully alert, but following 0% of basic commands. No verbal responses during evaluation. Despite cognitive/language impairments, patient presents with grossly functional oropharygneal swallow with appearance of adequate airway protection at bedside. Swallow function appears consistent with previous assessment; mild  residue with dry cracker. Pt is able to self-feed cup and straw sips of liquids, finger foods with assistance. Requires total assistance for spoon. No family present to confirm prior level of function, however in previous admissions family expressed a preference for softer foods. Recommend dys 3, thin liquids, meds crushed in puree. Full supervision; assist with meals but allow pt to self-feed as much as she is able. Will s/o for dysphagia, but follow for cognitive-linguistic needs. SLP Visit Diagnosis: Dysphagia, unspecified (R13.10)    Aspiration Risk  Mild aspiration risk    Diet Recommendation Dysphagia 3 (Mech soft);Thin liquid   Liquid Administration via: Cup;Straw Medication Administration: Crushed with puree Supervision: Staff to assist with self feeding;Full supervision/cueing for compensatory strategies Compensations: Slow rate;Small sips/bites;Minimize environmental distractions Postural Changes: Seated upright at 90 degrees    Other  Recommendations Oral Care Recommendations: Oral care BID   Follow up Recommendations Skilled Nursing facility      Frequency and Duration            Prognosis Prognosis for Safe Diet Advancement: Good Barriers to Reach Goals: Cognitive deficits;Language deficits      Swallow Study   General Date of Onset: 02/06/17 HPI: Ebony Sheriffancy C Humphreyis an 83 y.o.femalewithPMH A-fib not on anticoagulation due to prior hemorrhage, CVA, subdural andsubarachnoid hemorrhage, and severe dementia who [facility] for altered mental status. CT head showed a 5x3x3 cm left frontal hemorrhage with mass effect and minimal midline shift. She had a speech/language evaluation in April 2018 with signs of an expressive aphasia and concern for PPA.SLP evaluated and followed briefly for diet tolerance during prior admission; discharged on  mechanical soft (preferred per daughters), thin liquids. Type of Study: Bedside Swallow Evaluation Previous  Swallow Assessment: see HPI Diet Prior to this Study: NPO Temperature Spikes Noted: Yes(100.7) Respiratory Status: Room air History of Recent Intubation: No Behavior/Cognition: Alert;Confused;Doesn't follow directions Oral Cavity Assessment: Within Functional Limits Oral Care Completed by SLP: Yes Oral Cavity - Dentition: Adequate natural dentition Vision: Functional for self-feeding Self-Feeding Abilities: Needs assist Patient Positioning: Upright in bed Baseline Vocal Quality: Not observed Volitional Cough: Cognitively unable to elicit Volitional Swallow: Unable to elicit    Oral/Motor/Sensory Function Overall Oral Motor/Sensory Function: Within functional limits   Ice Chips Ice chips: Within functional limits Presentation: Spoon   Thin Liquid Thin Liquid: Within functional limits Presentation: Cup;Straw;Self Fed    Nectar Thick Nectar Thick Liquid: Not tested   Honey Thick Honey Thick Liquid: Not tested   Puree Puree: Within functional limits Presentation: Spoon   Solid   GO   Solid: Impaired Presentation: Self Fed Oral Phase Functional Implications: Oral residue       Rondel BatonMary Beth Junius Faucett, MS, McKessonCCC-SLP Speech-Language Pathologist [phone]  Arlana LindauMary E Bellina Tokarczyk 02/07/2017,9:14 AM

## 2017-02-07 NOTE — Progress Notes (Signed)
OT Cancellation    02/07/17 0800  OT Visit Information  Last OT Received On 02/07/17  Reason Eval/Treat Not Completed Patient not medically ready. Pt with active bedrest orders. Will await increase in activity orders prior to initiating OT evaluation. Thank you!   Cashton Hosley MSOT, OTR/L Acute Rehab Pager: [phone] Office: [phone] 

## 2017-02-07 NOTE — Plan of Care (Signed)
Pt on dysphagia 3 diet. Tolerating feeding.

## 2017-02-07 NOTE — Progress Notes (Signed)
Pt HR briefly in the 160s, labored breathing while on nasal cannula (2L) with oxygen saturation of 94%. MD paged. RN received order to give 2.5mg  metoprolol x1 dose. RN will continue to assess and monitor pt closely.

## 2017-02-07 NOTE — Progress Notes (Signed)
PT Cancellation Note  Patient Details Name: Ebony [name] MRN: [id number] DOB: [birth year]   Cancelled Treatment:    Reason Eval/Treat Not Completed: Patient not medically ready.  Patient is on bedrest per orders.  **MD:  Please write orders for increased activity when appropriate for patient.  PT will initiate evaluation at that time.  Thank you.   Vena AustriaSusan H [name] 02/07/2017, 7:37 AM Durenda HurtSusan H. [name], PT, Orthopa[facility]ty Surgery CenterMBA Acute Rehab Services Pager [phone]

## 2017-02-07 NOTE — Evaluation (Signed)
Speech Language Pathology Evaluation Patient Details Name: Ebony [name] MRN: [id number] DOB: 05-17-1931 Today's Date: 02/07/2017 Time: [id number] SLP Time Calculation (min) (ACUTE ONLY): 12 min  Problem List:  Patient Active Problem List   Diagnosis Date Noted  . ICH (intracerebral hemorrhage) (HCC) 02/06/2017  . Urinary tract infection due to Enterococcus   . Intraventricular hemorrhage (HCC)   . History of recent fall   . Vascular dementia, uncomplicated   . Acute cystitis without hematuria   . Intracranial hemorrhage (HCC) 10/20/2016  . Hyponatremia 10/08/2016  . Hypertensive urgency 10/08/2016  . Polypharmacy   . Paroxysmal supraventricular tachycardia (HCC)   . Recurrent falls 09/04/2016  . Maxillary sinus fracture (HCC) 09/04/2016  . Closed fracture of nasal bone 09/04/2016  . Acute encephalopathy   . Expressive aphasia   . History of TIA (transient ischemic attack) and stroke 06/24/2016  . Depression with anxiety 06/24/2016  . Hypothyroid 06/24/2016  . GERD (gastroesophageal reflux disease) 06/24/2016  . Allergic rhinitis 06/08/2014  . DDD (degenerative disc disease), lumbar 06/08/2014  . Insomnia 12/07/2013  . Depression   . OA (osteoarthritis) 01/06/2013  . Loss of weight 07/13/2012  . Vascular dementia without behavioral disturbance 06/20/2012  . Hypothyroidism due to acquired atrophy of thyroid 04/15/2012  . Essential hypertension 05/16/2011  . Anxiety disorder 05/16/2011   Past Medical History:  Past Medical History:  Diagnosis Date  . Acute ischemic stroke (HCC) 04/11/2012  . Allergic rhinitis   . Anxiety   . Arthritis    Knees  . Dementia   . Depression   . Fibrocystic breast disease   . Hiatal hernia   . Hyperlipidemia   . Hypertension   . IFG (impaired fasting glucose)   . SDH (subdural hematoma) (HCC) 05/16/2011  . Subarachnoid hemorrhage following injury (HCC) 05/16/2011  . Subarachnoid hemorrhage following injury (HCC) 05/16/2011  .  Unspecified hypothyroidism 04/15/2012   Past Surgical History:  Past Surgical History:  Procedure Laterality Date  . ABDOMINAL HYSTERECTOMY    . BREAST SURGERY    . EYE SURGERY    . TONSILLECTOMY     HPI:  [name] an 83 y.o.femalewithPMH A-fib not on anticoagulation due to prior hemorrhage, CVA, subdural andsubarachnoid hemorrhage, and severe dementia who [facility] status. CT head showed a 5x3x3 cm left frontal hemorrhage with mass effect and minimal midline shift. She had a speech/language evaluation in April 2018 with signs of an expressive aphasia and concern for PPA.SLP evaluated and followed briefly for diet tolerance during prior admission; discharged on mechanical soft (preferred per daughters), thin liquids.   Assessment / Plan / Recommendation Clinical Impression  Pt not following basic commands even with max cues and extra time. Does have expressive aphasia and dementia at baseline; concern for PPA noted in prior evaluations however pt would need additional testing. No family present to confirm baseline function, however per MD notes this admission family reports pt was conversational and she was initially following commands on admission. Impaired sustained attention observed today; pt easily distracted. Recommend continued skilled ST to maximize functional communication and cognition.     SLP Assessment  SLP Recommendation/Assessment: Patient needs continued Speech Lanaguage Pathology Services SLP Visit Diagnosis: Aphasia (R47.01);Cognitive communication deficit (R41.841)    Follow Up Recommendations  Skilled Nursing facility    Frequency and Duration min 1 x/week  1 week      SLP Evaluation Cognition  Overall Cognitive Status: Impaired/Different from baseline Arousal/Alertness: Awake/alert  Orientation Level: Disoriented X4 Attention: Focused;Sustained Focused Attention: Appears intact Sustained Attention:  Impaired Sustained Attention Impairment: Verbal basic;Functional basic       Comprehension  Auditory Comprehension Overall Auditory Comprehension: Impaired Yes/No Questions: Impaired Basic Biographical Questions: 0-25% accurate Commands: Impaired One Step Basic Commands: 0-24% accurate Interfering Components: Attention;Processing speed Visual Recognition/Discrimination Discrimination: Not tested Reading Comprehension Reading Status: Not tested    Expression Expression Primary Mode of Expression: Other (comment)(typically verbal but no verbal output during assessment) Verbal Expression Overall Verbal Expression: Impaired Initiation: Impaired Automatic Speech: (none) Level of Generative/Spontaneous Verbalization: (none) Repetition: Impaired Level of Impairment: Word level Naming: Impairment Responsive: 0-25% accurate Written Expression Dominant Hand: Right Written Expression: Not tested   Oral / Motor  Oral Motor/Sensory Function Overall Oral Motor/Sensory Function: Within functional limits(limited assessment as pt not following commands) Motor Speech Overall Motor Speech: Other (comment)(UTA)   GO                   Rondel BatonMary Beth Nazir Hacker, MS, CCC-SLP Speech-Language Pathologist [phone]  Arlana LindauMary E Caily Rakers 02/07/2017, 9:31 AM

## 2017-02-07 NOTE — Progress Notes (Signed)
STROKE TEAM PROGRESS NOTE   HISTORY OF PRESENT ILLNESS (per record) Ebony [name] is an 83 y.o. female wiiht PMH A-fib not on anticoagulation due to prior hemorrhage, CVA, subdural and subarachnoid hemorrhage, and severe dementia who presented to the ED from [facility] for altered mental status. She was last seen normal at 11 pm on 12/7. She is typically talkative and mobile however, this morning she was found to be minimally responsive.  CT head showed a 5x3x3 cm left frontal hemorrhage with mass effect and minimal midline shift.  Blood pressure was 202 systolic on arrival the patient was started on Cardene drip  Date last known well: 12.7.18 Time last known well: 9pm tPA Given: no, bleed NIHSS: 19 Baseline MRS 3     SUBJECTIVE (INTERVAL HISTORY) Her family is not bedside, patient is alert and in no acute distress, non verbal and not following commands   OBJECTIVE Temp:  [98.6 F (37 C)-101.1 F (38.4 C)] 98.6 F (37 C) (12/09 1201) Pulse Rate:  [35-103] 101 (12/09 1130) Cardiac Rhythm: Atrial fibrillation (12/09 0800) Resp:  [0-35] 23 (12/09 1130) BP: (97-163)/(52-108) 97/84 (12/09 1130) SpO2:  [89 %-100 %] 98 % (12/09 1130) Weight:  [133 lb 2.5 oz (60.4 kg)] 133 lb 2.5 oz (60.4 kg) (12/08 1600)  CBC:  Recent Labs  Lab 02/06/17 1016 02/06/17 1030  WBC 13.1*  --   NEUTROABS 9.7*  --   HGB 13.0 13.6  HCT 39.3 40.0  MCV 90.1  --   PLT 231  --     Basic Metabolic Panel:  Recent Labs  Lab 02/06/17 1016 02/06/17 1030  NA 132* 136  K 3.7 3.8  CL 96* 96*  CO2 25  --   GLUCOSE 120* 115*  BUN 10 11  CREATININE 0.76 0.70  CALCIUM 9.2  --     Lipid Panel:     Component Value Date/Time   CHOL 189 06/25/2016 0805   TRIG 189 (H) 06/25/2016 0805   HDL 46 06/25/2016 0805   CHOLHDL 4.1 06/25/2016 0805   VLDL 38 06/25/2016 0805   LDLCALC 105 (H) 06/25/2016 0805   HgbA1c:  Lab Results  Component Value Date   HGBA1C 5.8 (H) 06/25/2016   Urine Drug Screen:      Component Value Date/Time   LABOPIA NONE DETECTED 02/06/2017 1059   COCAINSCRNUR NONE DETECTED 02/06/2017 1059   LABBENZ NONE DETECTED 02/06/2017 1059   AMPHETMU NONE DETECTED 02/06/2017 1059   THCU NONE DETECTED 02/06/2017 1059   LABBARB NONE DETECTED 02/06/2017 1059    Alcohol Level     Component Value Date/Time   ETH <10 02/06/2017 1016    IMAGING  Ct Head Wo Contrast 02/07/2017 IMPRESSION:  1. Stable left frontal lobe hematoma (29 cc), associated edema, and local mass effect.  2. No new acute intracranial abnormality identified.    Ct Head Wo Contrast 02/06/2017 IMPRESSION:  1. Medial left frontal parenchymal hemorrhage with mild surrounding edema appears stable compared to earlier in the day. The degree of surrounding mass effect is stable. A small amount of adjacent subarachnoid hemorrhage is stable. No new hemorrhage evident. No mass or extra-axial fluid collection. No midline shift. There is underlying atrophy with patchy periventricular small vessel disease, stable.  2. There are foci of arterial vascular calcification. There is mild posterior left ethmoid air cell paranasal sinus disease.   Ct Head Wo Contrast 02/06/2017  IMPRESSION:  1. Large acute left frontal parenchymal hemorrhage with a small amount of adjacent  subarachnoid hemorrhage. There is associated mass effect and mild midline shift to the right.  2. No other acute intracranial abnormality.   Dg Chest Port 1 View 02/07/2017 IMPRESSION:  Left basilar airspace disease which could be atelectasis or pneumonia.     Dg Chest Port 1 View 02/06/2017 IMPRESSION:  No acute cardiopulmonary process.      Transthoracic Echocardiogram - pending 00/00/00    Bilateral Carotid Dopplers - pending 00/00/00     PHYSICAL EXAM Vitals:   02/07/17 1100 02/07/17 1115 02/07/17 1130 02/07/17 1201  BP: (!) 129/108 (!) 129/56 97/84   Pulse: (!) 101 (!) 44 (!) 101   Resp: (!) 23 (!) 23 (!) 23   Temp:     98.6 F (37 C)  TempSrc:    Axillary  SpO2: 95% 97% 98%   Weight:      Height:       PHYSICAL EXAM Physical exam: Exam: Gen: NAD Eyes: anicteric sclerae, moist conjunctivae                    CV: no MRG, no carotid bruits, no peripheral edema Mental Status: Alert, sitting up, tracks physician, no acute distress, patient is alert and in no acute distress, non verbal and not following commands  Neuro: Detailed Neurologic Exam  Speech:    Non verbal  Cranial Nerves:    The pupils are equal, round, and reactive to light.. Attempted, Fundi not visualized.  EOMI. Conjugate gaze.  preference. Blinks to threat.. Face symmetric, Tongue midline. Hearing intact to voice.  Motor Observation:    Right arm tremor    Strength and sensation: Moving all extremities to pain       ASSESSMENT/PLAN [name] is a 83 y.o. female with history of A-fib not on anticoagulation due to prior hemorrhage, CVA, subdural and subarachnoid hemorrhage, and severe dementia  presenting with altered mental status and minimally responsive. She did not receive IV t-PA due to hemorrhage.  Large acute left frontal parenchymal hemorrhage with a small amount of adjacent subarachnoid hemorrhage.  Resultant  encephalopathy  CT head - Large acute left frontal parenchymal hemorrhage with a small amount of adjacent subarachnoid hemorrhage. There is associated mass effect and mild midline shift to the right.   MRI head - not performed  MRA head - not performed  Carotid Doppler - not indicated  2D Echo - not indicated  LDL - not indicated  HgbA1c - not indicated  VTE prophylaxis - SCDs DIET DYS 3 Room service appropriate? Yes; Fluid consistency: Thin  aspirin 81 mg daily prior to admission, now on No antithrombotic  Ongoing aggressive stroke risk factor management  Therapy recommendations:  pending  Disposition:  Pending  Hypertension  Stable  Long-term BP goal  normotensive  Hyperlipidemia  Home meds: No lipid lowering medications prior to admission   Other Stroke Risk Factors  Advanced age  History of subdural and subarachnoid hemorrhages  Other Active Problems  CXR - Left basilar airspace disease which could be atelectasis or pneumonia. Afebrile WBCs - 13.1   Plan / Recommendations  Recheck labs in a.m.   Repeat CXR tomorrow  Start Zosyn  Low threshold to repeat CT head  Hospital day # 1  Personally examined patient and images, and have participated in and made any corrections needed to history, physical, neuro exam,assessment and plan as stated above.  I have personally obtained the history, evaluated lab date, reviewed imaging studies and agree with radiology interpretations.  Naomie DeanAntonia Norville Dani, MD Stroke Neurology   To contact Stroke Continuity provider, please refer to [email or web address]. After hours, contact General Neurology

## 2017-02-08 LAB — URINALYSIS, ROUTINE W REFLEX MICROSCOPIC
Bacteria, UA: NONE SEEN
Bilirubin Urine: NEGATIVE
GLUCOSE, UA: NEGATIVE mg/dL
Hgb urine dipstick: NEGATIVE
Ketones, ur: NEGATIVE mg/dL
Leukocytes, UA: NEGATIVE
Nitrite: NEGATIVE
PROTEIN: 30 mg/dL — AB
SPECIFIC GRAVITY, URINE: 1.017 (ref 1.005–1.030)
pH: 5 (ref 5.0–8.0)

## 2017-02-08 LAB — CBC WITH DIFFERENTIAL/PLATELET
BASOS ABS: 0 10*3/uL (ref 0.0–0.1)
Basophils Relative: 0 %
Eosinophils Absolute: 0 10*3/uL (ref 0.0–0.7)
Eosinophils Relative: 0 %
HEMATOCRIT: 32.7 % — AB (ref 36.0–46.0)
HEMOGLOBIN: 10.5 g/dL — AB (ref 12.0–15.0)
LYMPHS PCT: 15 %
Lymphs Abs: 1.9 10*3/uL (ref 0.7–4.0)
MCH: 29.2 pg (ref 26.0–34.0)
MCHC: 32.1 g/dL (ref 30.0–36.0)
MCV: 91.1 fL (ref 78.0–100.0)
Monocytes Absolute: 1.1 10*3/uL — ABNORMAL HIGH (ref 0.1–1.0)
Monocytes Relative: 9 %
NEUTROS ABS: 9.5 10*3/uL — AB (ref 1.7–7.7)
Neutrophils Relative %: 76 %
Platelets: 219 10*3/uL (ref 150–400)
RBC: 3.59 MIL/uL — AB (ref 3.87–5.11)
RDW: 14.4 % (ref 11.5–15.5)
WBC: 12.5 10*3/uL — AB (ref 4.0–10.5)

## 2017-02-08 LAB — BASIC METABOLIC PANEL
ANION GAP: 11 (ref 5–15)
BUN: 26 mg/dL — ABNORMAL HIGH (ref 6–20)
CHLORIDE: 104 mmol/L (ref 101–111)
CO2: 22 mmol/L (ref 22–32)
Calcium: 8.5 mg/dL — ABNORMAL LOW (ref 8.9–10.3)
Creatinine, Ser: 0.94 mg/dL (ref 0.44–1.00)
GFR calc Af Amer: >60 mL/min (ref >60)
GFR, EST NON AFRICAN AMERICAN: 55 mL/min — AB (ref >60)
Glucose, Bld: 127 mg/dL — ABNORMAL HIGH (ref 65–99)
POTASSIUM: 3.6 mmol/L (ref 3.5–5.1)
SODIUM: 137 mmol/L (ref 135–145)

## 2017-02-08 LAB — PHOSPHORUS: Phosphorus: 3.1 mg/dL (ref 2.5–4.6)

## 2017-02-08 LAB — MAGNESIUM: Magnesium: 1.9 mg/dL (ref 1.7–2.4)

## 2017-02-08 MED ORDER — LABETALOL HCL 5 MG/ML IV SOLN
20.0000 mg | INTRAVENOUS | Status: DC | PRN
  Administered 2017-02-09 (×3): 20 mg via INTRAVENOUS
  Filled 2017-02-08 (×3): qty 4

## 2017-02-08 MED ORDER — ATORVASTATIN CALCIUM 20 MG PO TABS
20.0000 mg | ORAL_TABLET | Freq: Every day | ORAL | Status: DC
  Administered 2017-02-08: 20 mg via ORAL
  Filled 2017-02-08: qty 1

## 2017-02-08 MED ORDER — PANTOPRAZOLE SODIUM 40 MG PO TBEC
40.0000 mg | DELAYED_RELEASE_TABLET | Freq: Every day | ORAL | Status: DC
  Administered 2017-02-08: 40 mg via ORAL
  Filled 2017-02-08: qty 1

## 2017-02-08 NOTE — Evaluation (Signed)
Occupational Therapy Evaluation Patient Details Name: Ebony Devonancy C Waiters MRN: [id number] DOB: [birth year] Today's Date: 02/08/2017    History of Present Illness Ebony [name] is an 83 y.o. female wiiht PMH A-fib not on anticoagulation due to prior hemorrhage, CVA, subdural and subarachnoid hemorrhage, and severe dementia who presented [facility] the ED [facility] for altered mental status.    Clinical Impression   Pt with history of dementia but was mobile and talkative at SNF PTA. Pt currently requires max-total assistance for all ADL tasks secondary to above. She was able to complete simulated stand-pivot toilet transfer with mod assist +2 this session. Pt presents with decreased ability to follow commands and impaired communication as well as decreased attention to R side of body and visual field. She would benefit from continued OT services while admitted to improve independence and safety with ADL participation. Recommend SNF level rehabilitation post-acute D/C.     Follow Up Recommendations  SNF;Supervision/Assistance - 24 hour    Equipment Recommendations  Other (comment)(defer to next venue of care)    Recommendations for Other Services       Precautions / Restrictions Precautions Precautions: Fall Precaution Comments: h/o severe dementia Restrictions Weight Bearing Restrictions: No      Mobility Bed Mobility Overal bed mobility: Needs Assistance Bed Mobility: Sit to Supine     Supine to sit: Max assist;+2 for physical assistance Sit to supine: Max assist;+2 for physical assistance   General bed mobility comments: Pt requiring max assist to initiate task and to manage B LE back into bed.   Transfers Overall transfer level: Needs assistance Equipment used: 2 person hand held assist Transfers: Sit to/from Stand Sit to Stand: Mod assist;+2 safety/equipment;+2 physical assistance         General transfer comment: Pt initiating transfers multiple times and  difficulty following commands that this was not a safe time to stand.     Balance Overall balance assessment: Needs assistance Sitting-balance support: No upper extremity supported;Feet supported Sitting balance-Leahy Scale: Fair     Standing balance support: Bilateral upper extremity supported Standing balance-Leahy Scale: Poor                             ADL either performed or assessed with clinical judgement   ADL Overall ADL's : Needs assistance/impaired Eating/Feeding: Maximal assistance;Sitting   Grooming: Total assistance;Sitting   Upper Body Bathing: Total assistance;Sitting   Lower Body Bathing: Total assistance;Sit to/from stand   Upper Body Dressing : Total assistance;Sitting   Lower Body Dressing: Total assistance;Sit to/from stand   Toilet Transfer: Moderate assistance;+2 for physical assistance   Toileting- Clothing Manipulation and Hygiene: Total assistance;Sit to/from stand       Functional mobility during ADLs: Moderate assistance;+2 for physical assistance General ADL Comments: Needs total assistance to participate in dressing/bathing at this time due to cognitive deficits.      Vision   Vision Assessment?: Vision impaired- to be further tested in functional context Additional Comments: Unable to follow commands for full testing. Significant L gaze preference and decreased attention to R visual field but able to cross midline.      Perception     Praxis      Pertinent Vitals/Pain Pain Assessment: Faces Faces Pain Scale: No hurt     Hand Dominance Right   Extremity/Trunk Assessment Upper Extremity Assessment Upper Extremity Assessment: Difficult to assess due to impaired cognition(Decreased attention to R UE; R grasp strength equal  to L)   Lower Extremity Assessment Lower Extremity Assessment: Defer to PT evaluation   Cervical / Trunk Assessment Cervical / Trunk Assessment: Normal   Communication  Communication Communication: Expressive difficulties   Cognition Arousal/Alertness: Awake/alert Behavior During Therapy: Flat affect Overall Cognitive Status: Difficult to assess Area of Impairment: Following commands                       Following Commands: Follows one step commands inconsistently       General Comments: Pt non-verbal and following commands with gestural cues inconsistently.    General Comments  pt with left gaze preferance, no turning of head to the R despite verbal and tactile cues to do so    Exercises     Shoulder Instructions      Home Living Family/patient expects to be discharged to:: Skilled nursing facility     Type of Home: Skilled Nursing Facility                           Additional Comments: [facility]      Prior Functioning/Environment          Comments: Per chart review, pt was mobile and talkative but unsure of level of ADL independence or use of AD.         OT Problem List: Decreased strength;Decreased activity tolerance;Impaired balance (sitting and/or standing);Decreased safety awareness;Decreased knowledge of use of DME or AE;Decreased knowledge of precautions;Pain;Impaired UE functional use      OT Treatment/Interventions: Self-care/ADL training;Therapeutic exercise;Energy conservation;DME and/or AE instruction;Therapeutic activities;Patient/family education;Balance training    OT Goals(Current goals can be found in the care plan section) Acute Rehab OT Goals Patient Stated Goal: unable to state OT Goal Formulation: With patient Time For Goal Achievement: 02/22/17 Potential to Achieve Goals: Good ADL Goals Pt Will Perform Grooming: with min assist;sitting Pt Will Perform Upper Body Dressing: with min assist;sitting Additional ADL Goal #1: Pt will follow 3/4 one step commands with 2 visual cues during grooming tasks. Additional ADL Goal #2: Pt will attend to R UE during dressing tasks  with no tactile or verbal cues.  OT Frequency: Min 2X/week   Barriers to D/C:            Co-evaluation              AM-PAC PT "6 Clicks" Daily Activity     Outcome Measure Help from another person eating meals?: A Lot Help from another person taking care of personal grooming?: Total Help from another person toileting, which includes using toliet, bedpan, or urinal?: Total Help from another person bathing (including washing, rinsing, drying)?: Total Help from another person to put on and taking off regular upper body clothing?: Total Help from another person to put on and taking off regular lower body clothing?: Total 6 Click Score: 7   End of Session Equipment Utilized During Treatment: Gait belt Nurse Communication: Mobility status(RN assisted pt with pericare)  Activity Tolerance: Patient tolerated treatment well Patient left: in bed;with call bell/phone within reach;with nursing/sitter in room  OT Visit Diagnosis: Other abnormalities of gait and mobility (R26.89);Low vision, both eyes (H54.2)                Time: [id number] OT Time Calculation (min): 30 min Charges:  OT General Charges $OT Visit: 1 Visit OT Evaluation $OT Eval Moderate Complexity: 1 Mod OT Treatments $Self Care/Home Management : 8-22 mins G-Codes:  Doristine Section, MS OTR/L  Pager: [phone]   Doristine Section 02/08/2017, 4:54 PM

## 2017-02-08 NOTE — Progress Notes (Signed)
OT Cancellation Note  Patient Details Name: Ebony [name] MRN: [id number] DOB: 01-Nov-1931   Cancelled Treatment:    Reason Eval/Treat Not Completed: Other (comment). Pt remains on strict bedrest, will evaluate once activity orders have been increased.  Evette GeorgesLeonard, Angeleigh Chiasson Eva [phone] 02/08/2017, 7:28 AM

## 2017-02-08 NOTE — Evaluation (Signed)
Physical Therapy Evaluation Patient Details Name: Ebony [name] MRN: [id number] DOB: 1931/05/11 Today's Date: 02/08/2017   History of Present Illness  Ebony [name] is an 83 y.o. female wiiht PMH A-fib not on anticoagulation due to prior hemorrhage, CVA, subdural and subarachnoid hemorrhage, and severe dementia who [facility] for altered [facility] status.   Clinical Impression  Pt admitted with above. Pt with h/o severe dementia however per chart was mobile and talkative. Pt did tolerate ambulation this date but required maxAx2 for all mobility except ambulation min/modAx2 due to no initiation or comprehension. Pt non-verbal this date with L gaze preference. No tracking to the R. Pt remains appropriate to return to [facility] for re[facility] to achieve maximal functional recovery. Lewis ShockAshly Rohini Jaroszewski, PT, DPT Pager #: [phone] Office #: [phone] Acute PT to con't to follow.    Follow Up Recommendations SNF;Supervision/Assistance - 24 hour    Equipment Recommendations  None recommended by PT    Recommendations for Other Services       Precautions / Restrictions Precautions Precautions: Fall Precaution Comments: h/o severe dementia Restrictions Weight Bearing Restrictions: No      Mobility  Bed Mobility Overal bed mobility: Needs Assistance Bed Mobility: Supine to Sit     Supine to sit: Max assist;+2 for physical assistance     General bed mobility comments: pt with no initiation of transfer/task. pt maxA for LE mangement and trunk elevation  Transfers Overall transfer level: Needs assistance Equipment used: 2 person hand held assist Transfers: Sit to/from Stand Sit to Stand: Max assist;+2 physical assistance         General transfer comment: pt with no initiation of transfer, max tactile and verbal cues, max A to block knees to achieve terminal knee extension and upright posture, once in position pt able to maintain with  minAx2  Ambulation/Gait Ambulation/Gait assistance: Mod assist;+2 physical assistance;+2 safety/equipment Ambulation Distance (Feet): 120 Feet Assistive device: 2 person hand held assist Gait Pattern/deviations: Step-through pattern;Decreased stride length;Narrow base of support Gait velocity: slow Gait velocity interpretation: Below normal speed for age/gender General Gait Details: pt able to ambulate with tactile cues for forward translation. pt with onset of fatigue pt with stronger L lateral lean requiring increased assist to maintai upright posture  Stairs            Wheelchair Mobility    Modified Rankin (Stroke Patients Only) Modified Rankin (Stroke Patients Only) Pre-Morbid Rankin Score: Slight disability Modified Rankin: Moderately severe disability     Balance Overall balance assessment: Needs assistance Sitting-balance support: No upper extremity supported;Feet supported Sitting balance-Leahy Scale: Fair     Standing balance support: Bilateral upper extremity supported Standing balance-Leahy Scale: Poor                               Pertinent Vitals/Pain Pain Assessment: Faces Faces Pain Scale: No hurt    Home Living Family/patient expects to be discharged to:: Skilled nursing facility     Type of Home: Skilled Nursing Facility           Additional Comments: [facility]    Prior Function           Comments: per chart pt was mobile and talkative but unsure what level of mobility pt was at ie. amb with RW or w/c level of mobility     Hand Dominance  Extremity/Trunk Assessment   Upper Extremity Assessment Upper Extremity Assessment: Difficult to assess due to impaired cognition    Lower Extremity Assessment Lower Extremity Assessment: Difficult to assess due to impaired cognition(however was able to ambulate)    Cervical / Trunk Assessment Cervical / Trunk Assessment: Normal  Communication    Communication: Expressive difficulties  Cognition Arousal/Alertness: Awake/alert Behavior During Therapy: Flat affect Overall Cognitive Status: No family/caregiver present to determine baseline cognitive functioning                                 General Comments: pt non-verbal, did smile one time when pt wished a happy birthday, pt with extremely flat affect and wouldn't even open mouth to take a temperature      General Comments General comments (skin integrity, edema, etc.): pt with left gaze preferance, no turning of head to the R despite verbal and tactile cues to do so    Exercises     Assessment/Plan    PT Assessment Patient needs continued PT services  PT Problem List Decreased strength;Decreased range of motion;Decreased activity tolerance;Decreased balance;Decreased mobility;Decreased coordination;Decreased cognition;Decreased knowledge of use of DME;Decreased safety awareness       PT Treatment Interventions DME instruction;Functional mobility training;Stair training;Gait training;Therapeutic activities;Therapeutic exercise;Balance training;Neuromuscular re-education;Cognitive remediation    PT Goals (Current goals can be found in the Care Plan section)  Acute Rehab PT Goals Patient Stated Goal: didn't state PT Goal Formulation: Patient unable to participate in goal setting Time For Goal Achievement: 02/15/17 Potential to Achieve Goals: Fair    Frequency Min 3X/week   Barriers to discharge        Co-evaluation               AM-PAC PT "6 Clicks" Daily Activity  Outcome Measure Difficulty turning over in bed (including adjusting bedclothes, sheets and blankets)?: Unable Difficulty moving from lying on back to sitting on the side of the bed? : Unable Difficulty sitting down on and standing up from a chair with arms (e.g., wheelchair, bedside commode, etc,.)?: Unable Help needed moving to and from a bed to chair (including a wheelchair)?: A  Lot Help needed walking in hospital room?: A Lot Help needed climbing 3-5 steps with a railing? : Total 6 Click Score: 8    End of Session Equipment Utilized During Treatment: Gait belt Activity Tolerance: Patient tolerated treatment well Patient left: in chair;with call bell/phone within reach;with chair alarm set;with nursing/sitter in room Nurse Communication: Mobility status PT Visit Diagnosis: Other abnormalities of gait and mobility (R26.89)    Time: [id number] PT Time Calculation (min) (ACUTE ONLY): 26 min   Charges:   PT Evaluation $PT Eval Moderate Complexity: 1 Mod PT Treatments $Gait Training: 8-22 mins   PT G Codes:        Lewis ShockAshly Melena Hayes, PT, DPT Pager #: [phone] Office #: [phone]   Malon Branton M Ameer Sanden 02/08/2017, 3:02 PM

## 2017-02-08 NOTE — Progress Notes (Signed)
STROKE TEAM PROGRESS NOTE  Admission History: Ebony [name] is an 83 y.o. female wiiht PMH A-fib not on anticoagulation due to prior hemorrhage, CVA, subdural and subarachnoid hemorrhage, and severe dementia who presented to the ED from [facility] for altered mental status. She was last seen normal at 11 pm on 12/7. She is typically talkative and mobile however, this morning she was found to be minimally responsive.  CT head showed a 5x3x3 cm left frontal hemorrhage with mass effect and minimal midline shift.  Blood pressure was 202 systolic on arrival the patient was started on Cardene drip  Date last known well: 12.7.18 Time last known well: 9pm tPA Given: no, bleed NIHSS: 19 Baseline MRS 3 Intracerebral Hemorrhage (ICH) Score  Glascow Coma Score 5-12 +1  Age >/= 80 yes +1  ICH volume >/= 30ml   no 0  IVH no  Infratentorial origin no 0 Total:  2  SUBJECTIVE (INTERVAL HISTORY) No family is at the bedside. Patient is found laying in bed in NAD  No new events reported overnight.  OBJECTIVE Lab Results: CBC:  Recent Labs  Lab 02/06/17 1016 02/06/17 1030 02/08/17 0239  WBC 13.1*  --  12.5*  HGB 13.0 13.6 10.5*  HCT 39.3 40.0 32.7*  MCV 90.1  --  91.1  PLT 231  --  219   BMP: Recent Labs  Lab 02/06/17 1016 02/06/17 1030 02/08/17 0239  NA 132* 136 137  K 3.7 3.8 3.6  CL 96* 96* 104  CO2 25  --  22  GLUCOSE 120* 115* 127*  BUN 10 11 26*  CREATININE 0.76 0.70 0.94  CALCIUM 9.2  --  8.5*  MG  --   --  1.9  PHOS  --   --  3.1   Liver Function Tests:  Recent Labs  Lab 02/06/17 1016  AST 22  ALT 12*  ALKPHOS 88  BILITOT 0.7  PROT 7.1  ALBUMIN 3.9   Coagulation Studies:  Recent Labs    02/06/17 1016  APTT 31  INR 0.97   Urinalysis:  Recent Labs  Lab 02/06/17 1100 02/08/17 0215  COLORURINE YELLOW YELLOW  APPEARANCEUR CLEAR TURBID*  LABSPEC 1.012 1.017  PHURINE 8.0 5.0  GLUCOSEU NEGATIVE NEGATIVE  HGBUR NEGATIVE NEGATIVE  BILIRUBINUR  NEGATIVE NEGATIVE  KETONESUR NEGATIVE NEGATIVE  PROTEINUR 100* 30*  NITRITE NEGATIVE NEGATIVE  LEUKOCYTESUR NEGATIVE NEGATIVE    PHYSICAL EXAM Temp:  [98.8 F (37.1 C)-100.3 F (37.9 C)] 98.9 F (37.2 C) (12/10 1136) Pulse Rate:  [40-123] 86 (12/10 1400) Resp:  [7-34] 24 (12/10 1400) BP: (85-167)/(43-94) 156/72 (12/10 1400) SpO2:  [90 %-100 %] 93 % (12/10 1400) General - Well nourished, well developed, in no apparent distress Respiratory - Lungs clear bilaterally. No wheezing. Cardiovascular - Irregular rate and rhythm   Neurological Examination Mental Status: Patient opens eyes spontaneously however nonverbal and not following any commands Cranial Nerves: Pupils equal and reactive 3 mm., Face symmetric, tongue midline Motor: Withdraws with good strength all 4 extremities Tone and bulk:normal tone throughout; no atrophy noted, +Right hand tremor Sensory: Withdraws to noxious stimuli both sides equally Deep Tendon Reflexes: 1+ and symmetric throughout Plantars: Right: downgoing                                Left: downgoing Cerebellar: Unable to assess Gait: Unable to assess   IMAGING: I have personally reviewed the radiological images below and agree  with the radiology interpretations. Ct Head Wo Contrast Result Date: 02/07/2017 IMPRESSION: 1. Stable left frontal lobe hematoma (29 cc), associated edema, and local mass effect. 2. No new acute intracranial abnormality identified. Electronically   Ct Head Wo Contrast Result Date: 02/06/2017 IMPRESSION: 1. Medial left frontal parenchymal hemorrhage with mild surrounding edema appears stable compared to earlier in the day. The degree of surrounding mass effect is stable. A small amount of adjacent subarachnoid hemorrhage is stable. No new hemorrhage evident. No mass or extra-axial fluid collection. No midline shift. There is underlying atrophy with patchy periventricular small vessel disease, stable. 2. There are foci of arterial  vascular calcification. There is mild posterior left ethmoid air cell paranasal sinus disease.   Ct Head Wo Contrast Result Date: 02/06/2017 IMPRESSION: 1. Large acute left frontal parenchymal hemorrhage with a small amount of adjacent subarachnoid hemorrhage. There is associated mass effect and mild midline shift to the right. 2. No other acute intracranial abnormality.  Dg Chest Port 1 View Result Date: 02/07/2017 IMPRESSION: No acute findings.  Dg Chest Port 1 View Result Date: 02/07/2017 IMPRESSION: Left basilar airspace disease which could be atelectasis or pneumonia.   Echocardiogram: 06/25/2016 Study Conclusions - Left ventricle: The cavity size was normal. Systolic function was   vigorous. The estimated ejection fraction was in the range of 65%   to 70%. Wall motion was normal; there were no regional wall   motion abnormalities. There was an increased relative   contribution of atrial contraction to ventricular filling.   Doppler parameters are consistent with abnormal left ventricular   relaxation (grade 1 diastolic dysfunction). - Aortic valve: Trileaflet; normal thickness, mildly calcified   leaflets. - Tricuspid valve: There was trivial regurgitation. - Pulmonary arteries: Systolic pressure could not be accurately   estimated. _____________________________________________________________________ ASSESSMENT: [name] is a 83 y.o. female with history of A-fib not on anticoagulation due to prior hemorrhage, CVA,subdural andsubarachnoid hemorrhage, and severe dementia  presenting with altered mental status and minimally responsive. She did not receive IV t-PA due to hemorrhage  Large acute left frontal parenchymal hemorrhage with a small amount of adjacent subarachnoid hemorrhage  Suspected Etiology: AFIB with hemorrhagic infarct not on AC Resultant Symptoms: Encephalopathy Stroke Risk Factors: atrial fibrillation, hyperlipidemia, hypertension and Hx of  multiple ICH's Other Stroke Risk Factors: Advanced age, Hx stroke  Outstanding Stroke Work-up Studies:  Work-up completed at this time  02/08/17: Neuro exam remains stable. Awaiting therapy recs. Work-up completed and likely d/c back to SNF in AM. Dr Pearlean Brownie discussed with daughter POC. Likely need Palliative care to assist family with goals of care.  PLAN  02/08/2017: ADD  Low dose Statin HOLD ASA for now Ongoing aggressive stroke risk factor management Conversation with family regarding goals of care Follow up with [facility] in 6 weeks  HX OF STROKES: Acute ischemic stroke (HCC) 04/11/2012   SDH (subdural hematoma) (HCC) 05/16/2011   . Subarachnoid hemorrhage following injury (HCC) 05/16/2011  . Subarachnoid hemorrhage following injury (HCC) 05/16/2011   AFIB, CHRONIC: Cardiology Consultation- pending Continue Metoprolol for Rate control as needed Continue to HOLD [facility] - patient not a candidate for [facility]  DYSPHAGIA: NPO until passes SLP swallow evaluation  CXR - ?Left basilar airspace disease atelectasis or pneumonia. Afebrile WBCs - 13.1 on admission, started on Zosyn Repeat CXR- no acute findings WBC today trending down to 12.5, last temp 98.9, U/A negative, Repeat labs in AM, likely to d/c Zosyn in AM  HYPERTENSION: Stable Goal  BPs less than 180 systolic Long term BP goal normotensive. Cardene drip discontinued, Labetalol PRN Restart Metoprolol last night for tachycardia Home Meds: Metoprolol, Lisinopril  HYPERLIPIDEMIA:    Component Value Date/Time   CHOL 189 06/25/2016 0805   TRIG 189 (H) 06/25/2016 0805   HDL 46 06/25/2016 0805   CHOLHDL 4.1 06/25/2016 0805   VLDL 38 06/25/2016 0805   LDLCALC 105 (H) 06/25/2016 0805  Home Meds:  NONE LDL  goal < 70 Started on  Lipitor to 20 mg daily Continue statin at discharge  R/O DIABETES: Lab Results  Component Value Date   HGBA1C 5.8 (H) 06/25/2016  HgbA1c goal < 7.0  Other Active Problems: Active  Problems:   ICH (intracerebral hemorrhage) [facility](HCC)  Hospital day # 2  VTE prophylaxis: SCD's  Diet : DIET DYS 3 Room service appropriate? Yes; Fluid consistency: Thin   Prior Home Stroke Medications: aspirin 81 mg daily  Discharge Stroke Meds: Please discharge patient on No antithrombotic   Disposition: 03-Skilled Nursing Facility Therapy Recs:  SNF- [facility]   Follow Recs:  Follow-up Information    Micki RileySethi, Thurman Sarver S, MD. Schedule an appointment as soon as possible for a visit in 6 week(s).   Specialties:  Neurology, Radiology Contact information: [address] [place] [address] [phone]          System, Pcp Not In- Case management aware  FAMILY UPDATES: No family at bedside  TEAM UPDATES: Micki RileySethi, Daoud Lobue S, MD STATUS:  DNR  Brita RompMary A Costello, ANP-C Stroke Neurology Team 02/08/2017 3:01 PM I have personally examined this patient, reviewed notes, independently viewed imaging studies, participated in medical decision making and plan of care.ROS completed by me personally and pertinent positives fully documented  I have made any additions or clarifications directly to the above note. Agree with note above.  She has presented with altered mental status and aphasia secondary to left frontal hemorrhage etiology indeterminate but likely hemorrhagic infarct from atrial fibrillation not on anticoagulation secondary to prior bleed.  Maintain strict blood pressure control and close neurological monitoring.  Family not available at bedside for discussion about goals of care.This patient is critically ill and at significant risk of neurological worsening, death and care requires constant monitoring of vital signs, hemodynamics,respiratory and cardiac monitoring, extensive review of multiple databases, frequent neurological assessment, discussion with family, other specialists and medical decision making of high complexity.I have made any additions or clarifications directly  to the above note.This critical care time does not reflect procedure time, or teaching time or supervisory time of PA/NP/Med Resident etc but could involve care discussion time.  I spent 30 minutes of neurocritical care time  in the care of  this patient.      Delia HeadyPramod Treanna Dumler, MD Medical Director [facility] Pager: [phone] 02/08/2017 4:23 PM  To contact Stroke Continuity provider, please refer to [email or web address]. After hours, contact General Neurology

## 2017-02-08 NOTE — Progress Notes (Signed)
2 failed attempts made to insert NG/OG. Will alert physician.

## 2017-02-08 NOTE — Progress Notes (Signed)
New BP goals<180. Cardene stopped for now and patient took her PO BP meds this am. Kellye Mizner, [place] ScrapeSarah E, RN

## 2017-02-09 DIAGNOSIS — I10 Essential (primary) hypertension: Secondary | ICD-10-CM | POA: Diagnosis not present

## 2017-02-09 DIAGNOSIS — S0990XA Unspecified injury of head, initial encounter: Secondary | ICD-10-CM | POA: Diagnosis not present

## 2017-02-09 DIAGNOSIS — S199XXA Unspecified injury of neck, initial encounter: Secondary | ICD-10-CM | POA: Diagnosis not present

## 2017-02-09 DIAGNOSIS — Y999 Unspecified external cause status: Secondary | ICD-10-CM | POA: Diagnosis not present

## 2017-02-09 DIAGNOSIS — R51 Headache: Secondary | ICD-10-CM | POA: Diagnosis not present

## 2017-02-09 DIAGNOSIS — R1312 Dysphagia, oropharyngeal phase: Secondary | ICD-10-CM | POA: Diagnosis not present

## 2017-02-09 DIAGNOSIS — R3981 Functional urinary incontinence: Secondary | ICD-10-CM | POA: Diagnosis not present

## 2017-02-09 DIAGNOSIS — G464 Cerebellar stroke syndrome: Secondary | ICD-10-CM | POA: Diagnosis not present

## 2017-02-09 DIAGNOSIS — Y939 Activity, unspecified: Secondary | ICD-10-CM | POA: Diagnosis not present

## 2017-02-09 DIAGNOSIS — Y92121 Bathroom in nursing home as the place of occurrence of the external cause: Secondary | ICD-10-CM | POA: Diagnosis not present

## 2017-02-09 DIAGNOSIS — R69 Illness, unspecified: Secondary | ICD-10-CM | POA: Diagnosis not present

## 2017-02-09 DIAGNOSIS — E876 Hypokalemia: Secondary | ICD-10-CM | POA: Diagnosis not present

## 2017-02-09 DIAGNOSIS — I482 Chronic atrial fibrillation: Secondary | ICD-10-CM | POA: Diagnosis not present

## 2017-02-09 DIAGNOSIS — S098XXA Other specified injuries of head, initial encounter: Secondary | ICD-10-CM | POA: Diagnosis not present

## 2017-02-09 DIAGNOSIS — S0191XA Laceration without foreign body of unspecified part of head, initial encounter: Secondary | ICD-10-CM | POA: Diagnosis not present

## 2017-02-09 DIAGNOSIS — Z9181 History of falling: Secondary | ICD-10-CM | POA: Diagnosis not present

## 2017-02-09 DIAGNOSIS — S0101XA Laceration without foreign body of scalp, initial encounter: Secondary | ICD-10-CM | POA: Diagnosis not present

## 2017-02-09 DIAGNOSIS — I615 Nontraumatic intracerebral hemorrhage, intraventricular: Secondary | ICD-10-CM | POA: Diagnosis not present

## 2017-02-09 DIAGNOSIS — I480 Paroxysmal atrial fibrillation: Secondary | ICD-10-CM | POA: Diagnosis not present

## 2017-02-09 DIAGNOSIS — F0390 Unspecified dementia without behavioral disturbance: Secondary | ICD-10-CM | POA: Diagnosis not present

## 2017-02-09 DIAGNOSIS — R4182 Altered mental status, unspecified: Secondary | ICD-10-CM | POA: Diagnosis not present

## 2017-02-09 DIAGNOSIS — R2689 Other abnormalities of gait and mobility: Secondary | ICD-10-CM | POA: Diagnosis not present

## 2017-02-09 DIAGNOSIS — G8911 Acute pain due to trauma: Secondary | ICD-10-CM | POA: Diagnosis not present

## 2017-02-09 DIAGNOSIS — R000 Tachycardia, unspecified: Secondary | ICD-10-CM | POA: Diagnosis not present

## 2017-02-09 DIAGNOSIS — R41841 Cognitive communication deficit: Secondary | ICD-10-CM | POA: Diagnosis not present

## 2017-02-09 DIAGNOSIS — Z79899 Other long term (current) drug therapy: Secondary | ICD-10-CM | POA: Diagnosis not present

## 2017-02-09 DIAGNOSIS — W010XXA Fall on same level from slipping, tripping and stumbling without subsequent striking against object, initial encounter: Secondary | ICD-10-CM | POA: Diagnosis not present

## 2017-02-09 DIAGNOSIS — I639 Cerebral infarction, unspecified: Secondary | ICD-10-CM | POA: Diagnosis not present

## 2017-02-09 DIAGNOSIS — R2681 Unsteadiness on feet: Secondary | ICD-10-CM | POA: Diagnosis not present

## 2017-02-09 DIAGNOSIS — M6281 Muscle weakness (generalized): Secondary | ICD-10-CM | POA: Diagnosis not present

## 2017-02-09 DIAGNOSIS — Z8673 Personal history of transient ischemic attack (TIA), and cerebral infarction without residual deficits: Secondary | ICD-10-CM | POA: Diagnosis not present

## 2017-02-09 DIAGNOSIS — I611 Nontraumatic intracerebral hemorrhage in hemisphere, cortical: Secondary | ICD-10-CM | POA: Diagnosis not present

## 2017-02-09 DIAGNOSIS — G4489 Other headache syndrome: Secondary | ICD-10-CM | POA: Diagnosis not present

## 2017-02-09 DIAGNOSIS — E039 Hypothyroidism, unspecified: Secondary | ICD-10-CM | POA: Diagnosis not present

## 2017-02-09 DIAGNOSIS — I69191 Dysphagia following nontraumatic intracerebral hemorrhage: Secondary | ICD-10-CM | POA: Diagnosis not present

## 2017-02-09 LAB — CBC
HCT: 38.6 % (ref 36.0–46.0)
Hemoglobin: 12 g/dL (ref 12.0–15.0)
MCH: 28.8 pg (ref 26.0–34.0)
MCHC: 31.1 g/dL (ref 30.0–36.0)
MCV: 92.6 fL (ref 78.0–100.0)
PLATELETS: 251 10*3/uL (ref 150–400)
RBC: 4.17 MIL/uL (ref 3.87–5.11)
RDW: 14.5 % (ref 11.5–15.5)
WBC: 12 10*3/uL — AB (ref 4.0–10.5)

## 2017-02-09 LAB — BASIC METABOLIC PANEL
Anion gap: 10 (ref 5–15)
BUN: 22 mg/dL — AB (ref 6–20)
CHLORIDE: 105 mmol/L (ref 101–111)
CO2: 26 mmol/L (ref 22–32)
CREATININE: 0.85 mg/dL (ref 0.44–1.00)
Calcium: 9.3 mg/dL (ref 8.9–10.3)
Glucose, Bld: 108 mg/dL — ABNORMAL HIGH (ref 65–99)
POTASSIUM: 3.3 mmol/L — AB (ref 3.5–5.1)
SODIUM: 141 mmol/L (ref 135–145)

## 2017-02-09 MED ORDER — POTASSIUM CHLORIDE 10 MEQ/100ML IV SOLN
10.0000 meq | INTRAVENOUS | Status: AC
  Administered 2017-02-09 (×2): 10 meq via INTRAVENOUS
  Filled 2017-02-09 (×2): qty 100

## 2017-02-09 MED ORDER — LISINOPRIL 20 MG PO TABS
40.0000 mg | ORAL_TABLET | Freq: Every day | ORAL | Status: DC
  Administered 2017-02-09: 40 mg via ORAL
  Filled 2017-02-09: qty 2

## 2017-02-09 MED ORDER — METOPROLOL TARTRATE 25 MG PO TABS: 12.5000 mg | ORAL_TABLET | Freq: Four times a day (QID) | ORAL | 1 refills | Status: AC

## 2017-02-09 MED ORDER — HYDRALAZINE HCL 20 MG/ML IJ SOLN
25.0000 mg | Freq: Once | INTRAMUSCULAR | Status: AC
  Administered 2017-02-09: 25 mg via INTRAVENOUS
  Filled 2017-02-09: qty 2

## 2017-02-09 MED ORDER — ATORVASTATIN CALCIUM 20 MG PO TABS: 20.0000 mg | ORAL_TABLET | Freq: Every day | ORAL | 1 refills | Status: AC

## 2017-02-09 NOTE — [place] FL2 (Signed)
[place] MEDICAID FL2 LEVEL OF CARE SCREENING TOOL     IDENTIFICATION  Patient Name: Ebony [name] Birthdate: 06/19/1931 Sex: female Admission Date (Current Location): 02/06/2017  [facility] [place]   Facility and Address:  [facility] [place], [address], [place], [place] [address]      Provider Number: [id number]  Attending Physician Name and Address:  Micki RileySethi, Pramod S, MD  Relative Name and Phone Number:  Darl PikesSusan, daughter, [phone]    Current Level of Care: Hospital Recommended Level of Care: Skilled Nursing Facility Prior Approval Number:    Date Approved/Denied:   PASRR Number: [id number]  Discharge Plan: SNF    Current Diagnoses: Patient Active Problem List   Diagnosis Date Noted  . ICH (intracerebral hemorrhage) (HCC) 02/06/2017  . Urinary tract infection due to Enterococcus   . Intraventricular hemorrhage (HCC)   . History of recent fall   . Vascular dementia, uncomplicated   . Acute cystitis without hematuria   . Intracranial hemorrhage (HCC) 10/20/2016  . Hyponatremia 10/08/2016  . Hypertensive urgency 10/08/2016  . Polypharmacy   . Paroxysmal supraventricular tachycardia (HCC)   . Recurrent falls 09/04/2016  . Maxillary sinus fracture (HCC) 09/04/2016  . Closed fracture of nasal bone 09/04/2016  . Acute encephalopathy   . Expressive aphasia   . History of TIA (transient ischemic attack) and stroke 06/24/2016  . Depression with anxiety 06/24/2016  . Hypothyroid 06/24/2016  . GERD (gastroesophageal reflux disease) 06/24/2016  . Allergic rhinitis 06/08/2014  . DDD (degenerative disc disease), lumbar 06/08/2014  . Insomnia 12/07/2013  . Depression   . OA (osteoarthritis) 01/06/2013  . Loss of weight 07/13/2012  . Vascular dementia without behavioral disturbance 06/20/2012  . Hypothyroidism due to acquired atrophy of thyroid 04/15/2012  . Essential hypertension 05/16/2011  . Anxiety disorder 05/16/2011     Orientation RESPIRATION BLADDER Height & Weight     Self  Normal Incontinent Weight: 60.4 kg (133 lb 2.5 oz) Height:  5\' 4"  (162.6 cm)  BEHAVIORAL SYMPTOMS/MOOD NEUROLOGICAL BOWEL NUTRITION STATUS      Incontinent Diet(Please see DC Summary)  AMBULATORY STATUS COMMUNICATION OF NEEDS Skin   Limited Assist Verbally Normal                       Personal Care Assistance Level of Assistance  Bathing, Dressing, Feeding Bathing Assistance: Limited assistance Feeding assistance: Limited assistance Dressing Assistance: Limited assistance     Functional Limitations Info             SPECIAL CARE FACTORS FREQUENCY  PT (By licensed PT), OT (By licensed OT)     PT Frequency: 3x/week OT Frequency: 2x/week            Contractures      Additional Factors Info  Code Status, Allergies Code Status Info: DNR Allergies Info: Sulfa Antibiotics, Ace Inhibitors, Biaxin Clarithromycin, Penicillins, Klor-con Potassium Chloride, Cozaar Losartan Potassium           Current Medications (02/09/2017):  This is the current hospital active medication list Current Facility-Administered Medications  Medication Dose Route Frequency Provider Last Rate Last Dose  . 0.9 %  sodium chloride infusion   Intravenous Continuous Anson FretAhern, Antonia B, MD 75 mL/hr at 02/09/17 1100    . acetaminophen (TYLENOL) suppository 650 mg  650 mg Rectal Q4H PRN Aroor, Dara LordsSushanth R, MD   650 mg at 02/08/17 2113  . atorvastatin (LIPITOR) tablet 20 mg  20 mg Oral q1800  Beryl Meagerostello, Mary A, NP   20 mg at 02/08/17 1748  . labetalol (NORMODYNE,TRANDATE) injection 20 mg  20 mg Intravenous Q2H PRN Micki RileySethi, Pramod S, MD   20 mg at 02/09/17 0659  . lisinopril (PRINIVIL,ZESTRIL) tablet 40 mg  40 mg Oral Daily Arlice Coltostello, Mary A, NP   40 mg at 02/09/17 0917  . MEDLINE mouth rinse  15 mL Mouth Rinse BID Aroor, Dara LordsSushanth R, MD   15 mL at 02/09/17 0923  . metoprolol tartrate (LOPRESSOR) tablet 12.5 mg  12.5 mg Oral Q6H Caryl PinaLindzen, Eric, MD    12.5 mg at 02/09/17 0917  . pantoprazole (PROTONIX) EC tablet 40 mg  40 mg Oral Daily Baldemar FridayMasters, Alison M, [place]   40 mg at 02/08/17 2110  . senna-docusate (Senokot-S) tablet 1 tablet  1 tablet Oral BID Aroor, Dara LordsSushanth R, MD   1 tablet at 02/09/17 [id number]     Discharge Medications: Please see discharge summary for a list of discharge medications.  Relevant Imaging Results:  Relevant Lab Results:   Additional Information SS#: [id number]  Mearl LatinNadia S Rayyan, LCSWA  I have personally examined this patient, reviewed notes, independently viewed imaging studies, participated in medical decision making and plan of care.ROS completed by me personally and pertinent positives fully documented  I have made any additions or clarifications directly to the above note. Agree with note above.  Delia HeadyPramod Sethi, MD Medical Director [facility] Pager: [phone] 02/09/2017 4:35 PM

## 2017-02-09 NOTE — Progress Notes (Signed)
As of this am patient has been able to follow simple commands and verbalize minimally. She is moving all extremities and even fed herself breakfast. She is impulsive and needs supervision with swallowing. Daughter has been updated. Ebony [name], [place] ScrapeSarah E, RN

## 2017-02-09 NOTE — Progress Notes (Signed)
Contacted RN at [facility] regarding patient return to facility. No questions/concerns. PTAR arrived to transport pt.

## 2017-02-09 NOTE — Care Management Note (Signed)
Case Management Note  Patient Details  Name: Ebony [name] MRN: [id number] Date of Birth: 1931/05/16  Subjective/Objective:   Ebony [name] is an 83 y.o. female wiiht PMH A-fib not on anticoagulation due to prior hemorrhage, CVA, subdural and subarachnoid hemorrhage, and severe dementia who presented [facility] the ED [facility] for altered mental status.                  Action/Plan: Pt medically stable for discharge today.  Pt discharging [facility] today, per CSW arrangements.    Expected Discharge Date:  02/09/17               Expected Discharge Plan:  Skilled Nursing Facility  In-House Referral:  Clinical Social Work  Discharge planning Services  CM Consult  Post Acute Care Choice:    Choice offered to:     DME Arranged:    DME Agency:     HH Arranged:    HH Agency:     Status of Service:  Completed, signed off  If discussed at [employer] [employer] Meetings, dates discussed:    Additional Comments:  Quintella BatonJulie W. Rishan Oyama, RN, BSN  Trauma/Neuro ICU Case Manager [phone]

## 2017-02-09 NOTE — Progress Notes (Signed)
Patient will DC to: [facility] Anticipated DC date: 02/09/17 Family notified: Daughter Transport by: Sharin MonsPTAR   Per MD patient ready for DC to [facility]. RN, patient, patient's family, and facility notified of DC. Discharge Summary sent to facility. RN given number for report. DC packet on chart. Ambulance transport requested for patient.   CSW signing off.  Cristobal GoldmannNadia Adileny Delon, [place]CSWA Clinical Social Worker [phone]

## 2017-02-09 NOTE — Clinical Social Work Note (Signed)
Clinical Social Work Assessment  Patient Details  Name: Ebony [name] MRN: [id number] Date of Birth: [birth year]  Date of referral:  02/09/17               Reason for consult:  Facility Placement                Permission sought to share information with:  Facility Medical sales representativeContact Representative, Family Supports Permission granted to share information::  No  Name::     Darl PikesSusan  Agency::  [facility]  Relationship::  Daughter  Contact Information:  [phone]  Housing/Transportation Living arrangements for the past 2 months:  Skilled Nursing Facility Source of Information:  Adult Children Patient Interpreter Needed:  None Criminal Activity/Legal Involvement Pertinent to Current Situation/Hospitalization:  No - Comment as needed Significant Relationships:  Adult Children Lives with:  Facility Resident Do you feel safe going back to the place where you live?  Yes Need for family participation in patient care:  Yes (Comment)  Care giving concerns:  CSW received consult for possible SNF placement at time of discharge. CSW spoke with patient's daughter. [facility] and is able to return today. CSW to continue to follow and assist with discharge planning needs.   Social Worker assessment / plan:  CSW spoke with patient's daughter concerning possibility of returning to SNF.  Employment status:  Retired Database administratornsurance information:  Managed Medicare PT Recommendations:  Skilled Nursing Facility Information / Referral to community resources:  Skilled Nursing Facility  Patient/Family's Response to care:  Patient's daughter reports agreement with discharge plan. Patient will need PTAR for transportation.    Patient/Family's Understanding of and Emotional Response to Diagnosis, Current Treatment, and Prognosis:  Patient/family is realistic regarding therapy needs and expressed being hopeful for SNF placement. Patient's daughter expressed understanding of CSW role and discharge process  as well as medical condition. No questions/concerns about plan or treatment.    Emotional Assessment Appearance:  Appears stated age Attitude/Demeanor/Rapport:  Unable to Assess Affect (typically observed):  Unable to Assess Orientation:  Oriented to Self Alcohol / Substance use:  Not Applicable Psych involvement (Current and /or in the community):  No (Comment)  Discharge Needs  Concerns to be addressed:  Care Coordination Readmission within the last 30 days:  No Current discharge risk:  None Barriers to Discharge:  No Barriers Identified   Mearl Latinadia S Shaunae Sieloff, LCSWA 02/09/2017, 1:07 PM

## 2017-02-09 NOTE — Discharge Summary (Signed)
Stroke Discharge Summary  Patient ID: Ebony [name]   MRN: [id number]      DOB: 1931-10-25  Date of Admission: 02/06/2017 Date of Discharge: 02/09/2017  Attending Physician:  Micki Riley, MD, Stroke MD Patient's PCP:  System, Pcp Not In - [facility]CP at [facility]  DISCHARGE DIAGNOSIS:  Active Problems:   ICH (intracerebral hemorrhage) (HCC) left frontal lobe etiology likely hemorrhagic infarct from atrial fibrillation while not on anticoagulation  Past Medical History:  Diagnosis Date  . Acute ischemic stroke (HCC) 04/11/2012  . Allergic rhinitis   . Anxiety   . Arthritis    Knees  . Dementia   . Depression   . Fibrocystic breast disease   . Hiatal hernia   . Hyperlipidemia   . Hypertension   . IFG (impaired fasting glucose)   . SDH (subdural hematoma) (HCC) 05/16/2011  . Subarachnoid hemorrhage following injury (HCC) 05/16/2011  . Subarachnoid hemorrhage following injury (HCC) 05/16/2011  . Unspecified hypothyroidism 04/15/2012   Past Surgical History:  Procedure Laterality Date  . ABDOMINAL HYSTERECTOMY    . BREAST SURGERY    . EYE SURGERY    . TONSILLECTOMY     Allergies as of 02/09/2017      Reactions   Sulfa Antibiotics Diarrhea   Ace Inhibitors    Allergy not noted on MAR   Biaxin [clarithromycin] Diarrhea   Penicillins Diarrhea   Has patient had a PCN reaction causing immediate rash, facial/tongue/throat swelling, SOB or lightheadedness with hypotension: Unknown Has patient had a PCN reaction causing severe rash involving mucus membranes or skin necrosis: Unknown Has patient had a PCN reaction that required hospitalization: Unknown Has patient had a PCN reaction occurring within the last 10 years: Unknown If all of the above answers are "NO", then may proceed with Cephalosporin use.   Klor-con [potassium Chloride] Other (See Comments)   Noted to be an allergy on the [facility]   Cozaar [losartan Potassium] Rash      Medication List    STOP taking these  medications   aspirin 81 MG chewable tablet   metoprolol succinate 100 MG 24 hr tablet Commonly known as:  TOPROL-XL     TAKE these medications   acetaminophen 500 MG tablet Commonly known as:  TYLENOL Take 1,000 mg by mouth 3 (three) times daily.   atorvastatin 20 MG tablet Commonly known as:  LIPITOR Take 1 tablet (20 mg total) by mouth daily at 6 PM.   BIOFREEZE 4 % Gel Generic drug:  Menthol (Topical Analgesic) Apply 1 application topically every 8 (eight) hours as needed (knee pain).   carboxymethylcellulose 0.5 % Soln Commonly known as:  REFRESH PLUS Place 1 drop into both eyes daily.   fluticasone 50 MCG/ACT nasal spray Commonly known as:  FLONASE Place 2 sprays into both nostrils daily.   ibuprofen 200 MG tablet Commonly known as:  ADVIL,MOTRIN Take 200 mg by mouth every 12 (twelve) hours as needed for moderate pain.   levothyroxine 25 MCG tablet Commonly known as:  SYNTHROID, LEVOTHROID Take 1 tablet (25 mcg total) by mouth daily before breakfast.   lisinopril 40 MG tablet Commonly known as:  PRINIVIL,ZESTRIL Take 40 mg by mouth daily.   Melatonin 3 MG Caps By mouth qhs for sleep What changed:    how much to take  how to take this  when to take this  additional instructions   metoprolol tartrate 25 MG tablet Commonly known as:  LOPRESSOR Take 0.5  tablets (12.5 mg total) by mouth every 6 (six) hours.   MILK OF MAGNESIA 400 MG/5ML suspension Generic drug:  magnesium hydroxide Take 30 mLs by mouth daily as needed for mild constipation.   mirtazapine 15 MG tablet Commonly known as:  REMERON Take 15 mg by mouth at bedtime.   pantoprazole 20 MG tablet Commonly known as:  PROTONIX Take 20 mg by mouth every morning.   polyethylene glycol packet Commonly known as:  MIRALAX / GLYCOLAX Take 17 g by mouth daily.   saccharomyces boulardii 250 MG capsule Commonly known as:  FLORASTOR Take 250 mg by mouth 2 (two) times daily.   senna 8.6 MG Tabs  tablet Commonly known as:  SENOKOT Take 2 tablets by mouth daily as needed for mild constipation. Mon. Wed. And Friday for constipation   THERA-M PO Take 1 tablet by mouth every morning.   traMADol 50 MG tablet Commonly known as:  ULTRAM Take 25 mg by mouth 2 (two) times daily as needed for moderate pain.      LABORATORY STUDIES CBC    Component Value Date/Time   WBC 12.0 (H) 02/09/2017 0347   RBC 4.17 02/09/2017 0347   HGB 12.0 02/09/2017 0347   HCT 38.6 02/09/2017 0347   PLT 251 02/09/2017 0347   MCV 92.6 02/09/2017 0347   MCH 28.8 02/09/2017 0347   MCHC 31.1 02/09/2017 0347   RDW 14.5 02/09/2017 0347   LYMPHSABS 1.9 02/08/2017 0239   MONOABS 1.1 (H) 02/08/2017 0239   EOSABS 0.0 02/08/2017 0239   BASOSABS 0.0 02/08/2017 0239   CMP    Component Value Date/Time   NA 141 02/09/2017 0347   NA 141 07/18/2014   K 3.3 (L) 02/09/2017 0347   CL 105 02/09/2017 0347   CO2 26 02/09/2017 0347   GLUCOSE 108 (H) 02/09/2017 0347   BUN 22 (H) 02/09/2017 0347   BUN 13 07/18/2014   CREATININE 0.85 02/09/2017 0347   CALCIUM 9.3 02/09/2017 0347   PROT 7.1 02/06/2017 1016   ALBUMIN 3.9 02/06/2017 1016   AST 22 02/06/2017 1016   ALT 12 (L) 02/06/2017 1016   ALKPHOS 88 02/06/2017 1016   BILITOT 0.7 02/06/2017 1016   GFRNONAA >60 02/09/2017 0347   GFRAA >60 02/09/2017 0347   COAGS Lab Results  Component Value Date   INR 0.97 02/06/2017   INR 0.99 10/20/2016   INR 0.96 10/07/2016   Lipid Panel    Component Value Date/Time   CHOL 189 06/25/2016 0805   TRIG 189 (H) 06/25/2016 0805   HDL 46 06/25/2016 0805   CHOLHDL 4.1 06/25/2016 0805   VLDL 38 06/25/2016 0805   LDLCALC 105 (H) 06/25/2016 0805   HgbA1C  Lab Results  Component Value Date   HGBA1C 5.8 (H) 06/25/2016   Urinalysis    Component Value Date/Time   COLORURINE YELLOW 02/08/2017 0215   APPEARANCEUR TURBID (A) 02/08/2017 0215   LABSPEC 1.017 02/08/2017 0215   PHURINE 5.0 02/08/2017 0215   GLUCOSEU  NEGATIVE 02/08/2017 0215   HGBUR NEGATIVE 02/08/2017 0215   BILIRUBINUR NEGATIVE 02/08/2017 0215   KETONESUR NEGATIVE 02/08/2017 0215   PROTEINUR 30 (A) 02/08/2017 0215   UROBILINOGEN 1.0 04/12/2012 1451   NITRITE NEGATIVE 02/08/2017 0215   LEUKOCYTESUR NEGATIVE 02/08/2017 0215   Urine Drug Screen     Component Value Date/Time   LABOPIA NONE DETECTED 02/06/2017 1059   COCAINSCRNUR NONE DETECTED 02/06/2017 1059   LABBENZ NONE DETECTED 02/06/2017 1059   AMPHETMU NONE DETECTED 02/06/2017 1059  THCU NONE DETECTED 02/06/2017 1059   LABBARB NONE DETECTED 02/06/2017 1059    Alcohol Level    Component Value Date/Time   ETH <10 02/06/2017 1016   SIGNIFICANT DIAGNOSTIC STUDIES Ct Head Wo Contrast Result Date: 02/07/2017 IMPRESSION: 1. Stable left frontal lobe hematoma (29 cc), associated edema, and local mass effect. 2. No new acute intracranial abnormality identified. Electronically   Ct Head Wo Contrast Result Date: 02/06/2017 IMPRESSION: 1. Medial left frontal parenchymal hemorrhage with mild surrounding edema appears stable compared to earlier in the day. The degree of surrounding mass effect is stable. A small amount of adjacent subarachnoid hemorrhage is stable. No new hemorrhage evident. No mass or extra-axial fluid collection. No midline shift. There is underlying atrophy with patchy periventricular small vessel disease, stable. 2. There are foci of arterial vascular calcification. There is mild posterior left ethmoid air cell paranasal sinus disease.   Ct Head Wo Contrast Result Date: 02/06/2017 IMPRESSION: 1. Large acute left frontal parenchymal hemorrhage with a small amount of adjacent subarachnoid hemorrhage. There is associated mass effect and mild midline shift to the right. 2. No other acute intracranial abnormality.  Dg Chest Port 1 View Result Date: 02/07/2017 IMPRESSION: No acute findings.  Dg Chest Port 1 View Result Date: 02/07/2017 IMPRESSION: Left basilar  airspace disease which could be atelectasis or pneumonia.   Echocardiogram: 06/25/2016 Study Conclusions - Left ventricle: The cavity size was normal. Systolic function was vigorous. The estimated ejection fraction was in the range of 65% to 70%. Wall motion was normal; there were no regional wall motion abnormalities. There was an increased relative contribution of atrial contraction to ventricular filling. Doppler parameters are consistent with abnormal left ventricular relaxation (grade 1 diastolic dysfunction). - Aortic valve: Trileaflet; normal thickness, mildly calcified leaflets. - Tricuspid valve: There was trivial regurgitation. - Pulmonary arteries: Systolic pressure could not be accurately estimated.   HISTORY OF PRESENT ILLNESS  &  HOSPITAL COURSE Ms.Ebony Sheriffancy C Humphreyis a 83 y.o.femalewith history of A-fib not on anticoagulation due to prior hemorrhage, CVA,subduralandsubarachnoid hemorrhage, and severe dementiapresenting with altered mental status and minimally responsive.Shedidnot receive IV t-PA due tohemorrhage  Large acute left frontal parenchymal hemorrhage with a small amount of adjacent subarachnoid hemorrhage  Suspected Etiology: AFIB with hemorrhagic infarct not on AC Resultant Symptoms: Encephalopathy Stroke Risk Factors: atrial fibrillation, hyperlipidemia, hypertension and Hx of multiple ICH's Other Stroke Risk Factors: Advanced age, Hx stroke  Outstanding Stroke Work-up Studies:  Work-up completed at this time  02/08/17: Neuro exam remains stable. Awaiting therapy recs. Work-up completed and likely d/c back to SNF in AM. Dr Pearlean BrownieSethi discussed with daughter POC. Likely need Palliative care to assist family with goals of care.  Ebony [name] has presented with altered mental status and aphasia secondary to left frontal hemorrhage etiology indeterminate but likely hemorrhagic infarct from atrial fibrillation not on anticoagulation secondary to  prior bleed.  Maintain strict blood pressure control and close neurological monitoring.  Family not available at bedside for discussion about goals of care  02/09/17: Patient more awake and interactive with examiner today.  Answering questions with 1-2 word answers.  Following all commands.  Patient is medically stable and ready for discharge [facility].  Again no family at bedside this morning.  PLAN  02/08/2017: ADD  Low dose Statin HOLD ASA for now, will address safety to restart at follow up Neurology appointment Ongoing aggressive stroke risk factor management Conversation with family regarding goals of care [facility] 6 weeks  HX OF STROKES: Acute ischemic stroke (HCC) 04/11/2012   SDH (subdural hematoma) (HCC) 05/16/2011   . Subarachnoid hemorrhage following injury (HCC) 05/16/2011  . Subarachnoid hemorrhage following injury (HCC) 05/16/2011   AFIB, CHRONIC: Continue Metoprolol for Rate control as needed Continue to HOLD [facility] - patient not a candidate for [facility]  DYSPHAGIA: NPO until passes SLP swallow evaluation  CXR -?Left basilar airspace disease atelectasis or pneumonia.Afebrile WBCs - 13.1 on admission, started on Zosyn Repeat CXR- no acute findings WBC today trending down to 12.0, last temp 98.5, U/A negative, Repeat labs -wnl Zosyn discontinued yesterday  HYPOKALEMIA K 3.3 today - IV replacement in progress PCP to recheck  HYPERTENSION: Stable Goal BPs less than 180 systolic Long term BP goal normotensive. Cardene drip discontinued, Labetalol PRN Restart Metoprolol last night for tachycardia Home Meds: Metoprolol, Lisinopril  HYPERLIPIDEMIA: Labs(Brief)          Component Value Date/Time   CHOL 189 06/25/2016 0805   TRIG 189 (H) 06/25/2016 0805   HDL 46 06/25/2016 0805   CHOLHDL 4.1 06/25/2016 0805   VLDL 38 06/25/2016 0805   LDLCALC 105 (H) 06/25/2016 0805    Home Meds:   NONE LDL  goal < 70 Started on  Lipitor to 20 mg daily Continue statin at discharge  R/O DIABETES: RecentLabs  Lab Results  Component Value Date   HGBA1C 5.8 (H) 06/25/2016    HgbA1c goal < 7.0  DISCHARGE EXAM Blood pressure (!) 163/104, pulse 80, temperature 98.2 F (36.8 C), temperature source Oral, resp. rate (!) 23, height 5\' 4"  (1.626 m), weight 60.4 kg (133 lb 2.5 oz), SpO2 93 %. General - Well nourished, well developed, in no apparent distress Respiratory - Lungs clear bilaterally. No wheezing. Cardiovascular - Irregular rate and rhythm   Neurological Examination Mental Status: Patient awake, more interactive with examiner. Speaking one to two words. Following all commands Cranial Nerves: Pupils equal and reactive 3 mm., Face symmetric, tongue midline Motor:Withdraws with good strength all 4 extremities Tone and bulk:normal tone throughout; no atrophy noted, +Right hand tremor Sensory:Withdraws to noxious stimuli both sides equally Deep Tendon Reflexes:1+ and symmetric throughout Plantars: Right: downgoingLeft: downgoing Cerebellar: No ataxia noted on exam Gait:Not tested  Discharge Diet   DIET DYS 3 Room service appropriate? Yes; Fluid consistency: Thin liquids  DISCHARGE PLAN  Disposition:  [facility] Prior Home Stroke Medications: aspirin 81 mg daily  Discharge Stroke Meds: Please discharge patient on No antithrombotic   Disposition: 03-Skilled Nursing Facility Therapy Recs: SNF- [facility]   Follow Recs:     Follow-up Information    Micki Riley, MD. Schedule an appointment as soon as possible for a visit in 6 week(s).   Specialties:  Neurology, Radiology Contact information: [address] [place] [address] [phone]   Greater than 40 minutes were spent preparing discharge.  Beryl Meager, ANP-C Stroke Neurology Team 02/09/2017 1:01 PM I have personally examined this  patient, reviewed notes, independently viewed imaging studies, participated in medical decision making and plan of care.ROS completed by me personally and pertinent positives fully documented  I have made any additions or clarifications directly to the above note. Agree with note above.  Delia Heady, MD Medical Director [facility] Stroke Center Pager: [phone] 02/09/2017 2:55 PM

## 2017-02-09 NOTE — Progress Notes (Signed)
  Speech Language Pathology Treatment: Cognitive-Linquistic  Patient Details Name: Ebony [name] MRN: [id number] DOB: [birth year] Today's Date: 02/09/2017 Time: [id number] SLP Time Calculation (min) (ACUTE ONLY): 25 min  Assessment / Plan / Recommendation Clinical Impression  Pt demonstrates improving attention and communication function. Presentation is very similar to function in prior admissions, pt is initially mute, flat, minimally interactive and with time speech and cognitive function improve to baseline dementia. Today SLP was able to elicit y/n responses, pts name, repetition of words and phrases and confrontation naming 4/4 accurate. Pt also sustained attention to basic ADLS face washing brushing teeth) independently. Expect ongoing improvement. Will continue efforts.   HPI HPI: Ebony Sheriffancy C Humphreyis an 83 y.o.femalewithPMH A-fib not on anticoagulation due to prior hemorrhage, CVA, subdural andsubarachnoid hemorrhage, and severe dementia who [facility] for altered mental status. CT head showed a 5x3x3 cm left frontal hemorrhage with mass effect and minimal midline shift. She had a speech/language evaluation in April 2018 with signs of an expressive aphasia and concern for PPA.SLP evaluated and followed briefly for diet tolerance during prior admission; discharged on mechanical soft (preferred per daughters), thin liquids.      SLP Plan  Continue with current plan of care       Recommendations                   Oral Care Recommendations: Oral care BID Follow up Recommendations: Skilled Nursing facility SLP Visit Diagnosis: Aphasia (R47.01);Cognitive communication deficit (H08.657(R41.841) Plan: Continue with current plan of care       GO               [facility] LLCBonnie Jaqualin Serpa, MA CCC-SLP [phone]  Ebony [name], Ebony [name] 02/09/2017, 10:52 AM

## 2017-02-09 NOTE — Progress Notes (Signed)
Patient ready to be discharged with PTAR but at the same time BP in the low 200's. Order for one time hydralazine given and to watch patient for an hour. Will call PTAR back when patient is able to go. Briggitte Boline, [place] ScrapeSarah E, RN

## 2017-02-09 NOTE — Progress Notes (Signed)
PTAR picked up patient and are on their way to [facility] room [facility]. Armella Stogner, [place] ScrapeSarah E, RN

## 2017-02-10 ENCOUNTER — Encounter (HOSPITAL_COMMUNITY): Payer: Self-pay

## 2017-02-10 ENCOUNTER — Emergency Department (HOSPITAL_COMMUNITY)
Admission: EM | Admit: 2017-02-10 | Discharge: 2017-02-11 | Disposition: A | Discharge status: Home / Self Care | Payer: Medicare HMO | Attending: Emergency Medicine | Admitting: Emergency Medicine

## 2017-02-10 ENCOUNTER — Emergency Department (HOSPITAL_COMMUNITY): Payer: Medicare HMO

## 2017-02-10 DIAGNOSIS — I10 Essential (primary) hypertension: Secondary | ICD-10-CM | POA: Diagnosis not present

## 2017-02-10 DIAGNOSIS — R000 Tachycardia, unspecified: Secondary | ICD-10-CM | POA: Diagnosis not present

## 2017-02-10 DIAGNOSIS — R3981 Functional urinary incontinence: Secondary | ICD-10-CM | POA: Diagnosis not present

## 2017-02-10 DIAGNOSIS — G4489 Other headache syndrome: Secondary | ICD-10-CM | POA: Diagnosis not present

## 2017-02-10 DIAGNOSIS — Z8673 Personal history of transient ischemic attack (TIA), and cerebral infarction without residual deficits: Secondary | ICD-10-CM | POA: Diagnosis not present

## 2017-02-10 DIAGNOSIS — F0390 Unspecified dementia without behavioral disturbance: Secondary | ICD-10-CM | POA: Insufficient documentation

## 2017-02-10 DIAGNOSIS — S0990XA Unspecified injury of head, initial encounter: Secondary | ICD-10-CM

## 2017-02-10 DIAGNOSIS — Z79899 Other long term (current) drug therapy: Secondary | ICD-10-CM | POA: Diagnosis not present

## 2017-02-10 DIAGNOSIS — S098XXA Other specified injuries of head, initial encounter: Secondary | ICD-10-CM | POA: Diagnosis not present

## 2017-02-10 DIAGNOSIS — R69 Illness, unspecified: Secondary | ICD-10-CM | POA: Diagnosis not present

## 2017-02-10 DIAGNOSIS — S0101XA Laceration without foreign body of scalp, initial encounter: Secondary | ICD-10-CM

## 2017-02-10 DIAGNOSIS — E876 Hypokalemia: Secondary | ICD-10-CM | POA: Diagnosis not present

## 2017-02-10 DIAGNOSIS — Y999 Unspecified external cause status: Secondary | ICD-10-CM | POA: Insufficient documentation

## 2017-02-10 DIAGNOSIS — I482 Chronic atrial fibrillation: Secondary | ICD-10-CM | POA: Diagnosis not present

## 2017-02-10 DIAGNOSIS — W010XXA Fall on same level from slipping, tripping and stumbling without subsequent striking against object, initial encounter: Secondary | ICD-10-CM | POA: Diagnosis not present

## 2017-02-10 DIAGNOSIS — S199XXA Unspecified injury of neck, initial encounter: Secondary | ICD-10-CM | POA: Diagnosis not present

## 2017-02-10 DIAGNOSIS — Y92121 Bathroom in nursing home as the place of occurrence of the external cause: Secondary | ICD-10-CM | POA: Insufficient documentation

## 2017-02-10 DIAGNOSIS — Y939 Activity, unspecified: Secondary | ICD-10-CM | POA: Diagnosis not present

## 2017-02-10 DIAGNOSIS — I639 Cerebral infarction, unspecified: Secondary | ICD-10-CM | POA: Diagnosis not present

## 2017-02-10 MED ORDER — TETANUS-DIPHTH-ACELL PERTUSSIS 5-2.5-18.5 LF-MCG/0.5 IM SUSP
0.5000 mL | Freq: Once | INTRAMUSCULAR | Status: AC
  Administered 2017-02-11: 0.5 mL via INTRAMUSCULAR
  Filled 2017-02-10: qty 0.5

## 2017-02-10 NOTE — ED Triage Notes (Signed)
[facility]. Pt sustained a fall sitting on the toilet and fell forward. Pt sustained a lac about 3 inches on the left side of the forehead. Pt denise LOC. Pt has DNR. Hx of dementia. Pt GCS 15

## 2017-02-10 NOTE — ED Provider Notes (Signed)
[facility] EMERGENCY DEPARTMENT Provider Note   CSN: [id number] Arrival date & time: 02/10/17  2305     History   Chief Complaint Chief complaint: fall, head laceration   Level 5 caveat due to dementia HPI Ebony [name] is a 83 y.o. female.  The history is provided by the nursing home and a relative.  Fall  This is a new problem. The current episode started 1 to 2 hours ago. The problem occurs constantly. The problem has not changed since onset.Associated symptoms include headaches. Nothing aggravates the symptoms. Nothing relieves the symptoms.   Patient with history of atrial fibrillation, history of dementia, history of recent stroke presents status post fall from nursing facility. Per nursing home report patient was in the restroom that she felt slightly dizzy bent over and stood up and then fell hitting her head She sustained a laceration to her scalp Per nursing home reports she was otherwise at her baseline after the fall She was without shoes during the fall which may have contributed to her fall No other acute injuries reported Spoke to nursing staff at [facility] where she resides Daughter at bedside also confirms story Past Medical History:  Diagnosis Date  . Acute ischemic stroke (HCC) 04/11/2012  . Allergic rhinitis   . Anxiety   . Arthritis    Knees  . Dementia   . Depression   . Fibrocystic breast disease   . Hiatal hernia   . Hyperlipidemia   . Hypertension   . IFG (impaired fasting glucose)   . SDH (subdural hematoma) (HCC) 05/16/2011  . Subarachnoid hemorrhage following injury (HCC) 05/16/2011  . Subarachnoid hemorrhage following injury (HCC) 05/16/2011  . Unspecified hypothyroidism 04/15/2012    Patient Active Problem List   Diagnosis Date Noted  . ICH (intracerebral hemorrhage) (HCC) 02/06/2017  . Urinary tract infection due to Enterococcus   . Intraventricular hemorrhage (HCC)   . History of recent fall   . Vascular  dementia, uncomplicated   . Acute cystitis without hematuria   . Intracranial hemorrhage (HCC) 10/20/2016  . Hyponatremia 10/08/2016  . Hypertensive urgency 10/08/2016  . Polypharmacy   . Paroxysmal supraventricular tachycardia (HCC)   . Recurrent falls 09/04/2016  . Maxillary sinus fracture (HCC) 09/04/2016  . Closed fracture of nasal bone 09/04/2016  . Acute encephalopathy   . Expressive aphasia   . History of TIA (transient ischemic attack) and stroke 06/24/2016  . Depression with anxiety 06/24/2016  . Hypothyroid 06/24/2016  . GERD (gastroesophageal reflux disease) 06/24/2016  . Allergic rhinitis 06/08/2014  . DDD (degenerative disc disease), lumbar 06/08/2014  . Insomnia 12/07/2013  . Depression   . OA (osteoarthritis) 01/06/2013  . Loss of weight 07/13/2012  . Vascular dementia without behavioral disturbance 06/20/2012  . Hypothyroidism due to acquired atrophy of thyroid 04/15/2012  . Essential hypertension 05/16/2011  . Anxiety disorder 05/16/2011    Past Surgical History:  Procedure Laterality Date  . ABDOMINAL HYSTERECTOMY    . BREAST SURGERY    . EYE SURGERY    . TONSILLECTOMY      OB History    No data available       Home Medications    Prior to Admission medications   Medication Sig Start Date End Date Taking? Authorizing Provider  acetaminophen (TYLENOL) 500 MG tablet Take 1,000 mg by mouth 3 (three) times daily.     [provider]  atorvastatin (LIPITOR) 20 MG tablet Take 1 tablet (20 mg total) by mouth daily  at 6 PM. 02/09/17   Beryl Meager, NP  carboxymethylcellulose (REFRESH PLUS) 0.5 % SOLN Place 1 drop into both eyes daily.    [provider]  fluticasone (FLONASE) 50 MCG/ACT nasal spray Place 2 sprays into both nostrils daily. 06/08/14   Sharon Seller, NP  ibuprofen (ADVIL,MOTRIN) 200 MG tablet Take 200 mg by mouth every 12 (twelve) hours as needed for moderate pain.    [provider]  levothyroxine  (SYNTHROID, LEVOTHROID) 25 MCG tablet Take 1 tablet (25 mcg total) by mouth daily before breakfast. 04/15/12   Christiane Ha, MD  lisinopril (PRINIVIL,ZESTRIL) 40 MG tablet Take 40 mg by mouth daily.  10/01/16   [provider]  magnesium hydroxide (MILK OF MAGNESIA) 400 MG/5ML suspension Take 30 mLs by mouth daily as needed for mild constipation.    [provider]  Melatonin 3 MG CAPS By mouth qhs for sleep Patient taking differently: Take 3 mg by mouth at bedtime.  11/17/13   Sharon Seller, NP  Menthol, Topical Analgesic, (BIOFREEZE) 4 % GEL Apply 1 application topically every 8 (eight) hours as needed (knee pain).    [provider]  metoprolol tartrate (LOPRESSOR) 25 MG tablet Take 0.5 tablets (12.5 mg total) by mouth every 6 (six) hours. 02/09/17   Beryl Meager, NP  mirtazapine (REMERON) 15 MG tablet Take 15 mg by mouth at bedtime.    [provider]  Multiple Vitamins-Minerals (THERA-M PO) Take 1 tablet by mouth every morning.    [provider]  pantoprazole (PROTONIX) 20 MG tablet Take 20 mg by mouth every morning.     [provider]  polyethylene glycol (MIRALAX / GLYCOLAX) packet Take 17 g by mouth daily.    [provider]  saccharomyces boulardii (FLORASTOR) 250 MG capsule Take 250 mg by mouth 2 (two) times daily.    [provider]  senna (SENOKOT) 8.6 MG TABS tablet Take 2 tablets by mouth daily as needed for mild constipation. Mon. Wed. And Friday for constipation     [provider]  traMADol (ULTRAM) 50 MG tablet Take 25 mg by mouth 2 (two) times daily as needed for moderate pain.    [provider]    Family History Family History  Problem Relation Age of Onset  . Lung cancer Sister   . CVA Neg Hx   . Diabetes Neg Hx   . Hypertension Neg Hx     Social History Social History   Tobacco Use  . Smoking status: Never Smoker  . Smokeless tobacco: Never Used  Substance Use  Topics  . Alcohol use: No  . Drug use: No     Allergies   Sulfa antibiotics; Ace inhibitors; Biaxin [clarithromycin]; Penicillins; Klor-con [potassium chloride]; and Cozaar [losartan potassium]   Review of Systems Review of Systems  Unable to perform ROS: Dementia  Neurological: Positive for headaches.     Physical Exam Updated Vital Signs BP (!) 180/84 (BP Location: Right Arm)   Pulse (!) 54   Resp 19   SpO2 98%   Physical Exam CONSTITUTIONAL: Elderly and frail HEAD: Large laceration to left forehead extending to scalp, bleeding controlled EYES: EOMI/PERRL ENMT: Mucous membranes moist no signs of facial trauma NECK: supple no meningeal signs SPINE/BACK:entire spine nontender, no bruising/crepitance/stepoffs noted to spine CV: Irregular Chest no bruising, no crepitus noted LUNGS: Lungs are clear to auscultation bilaterally, no apparent distress ABDOMEN: soft, nontender GU:no cva tenderness NEURO: Pt is resting with eyes  closed, but responds to voice and follows commands, at baseline mental status per daughter EXTREMITIES: pulses normal/equal, full ROM, pelvis stable, all other extremities/joints palpated/ranged and nontender SKIN: warm, color normal  ED Treatments / Results  Labs (all labs ordered are listed, but only abnormal results are displayed) Labs Reviewed  CBC - Abnormal; Notable for the following components:      Result Value   WBC 16.2 (*)    RBC 3.78 (*)    Hemoglobin 11.0 (*)    HCT 34.4 (*)    All other components within normal limits  DIFFERENTIAL - Abnormal; Notable for the following components:   Neutro Abs 11.9 (*)    Monocytes Absolute 1.1 (*)    All other components within normal limits  PROTIME-INR  APTT  COMPREHENSIVE METABOLIC PANEL    EKG  EKG Interpretation None       Radiology Ct Head Wo Contrast  Result Date: 02/11/2017 CLINICAL DATA:  Status post fall forward. Left forehead laceration. Recent intracranial hemorrhage.  Concern for cervical spine injury. EXAM: CT HEAD WITHOUT CONTRAST CT CERVICAL SPINE WITHOUT CONTRAST TECHNIQUE: Multidetector CT imaging of the head and cervical spine was performed following the standard protocol without intravenous contrast. Multiplanar CT image reconstructions of the cervical spine were also generated. COMPARISON:  CT of the head performed 02/07/2017, and MRI/MRV of the brain performed 10/20/2016. CT of the cervical spine performed 10/10/2016 FINDINGS: CT HEAD FINDINGS Brain: A large intraparenchymal hematoma is noted at the left frontal lobe, measuring 5.2 x 3.5 cm, increased in size from the prior study. Surrounding edema is noted, with trace adjacent subarachnoid hemorrhage. Approximately 3 mm of rightward midline shift is noted. Prominence of the ventricles and sulci reflects mild to moderate cortical volume loss. Diffuse periventricular and subcortical white matter change likely reflects small vessel ischemic microangiopathy. The brainstem and fourth ventricle are within normal limits. The basal ganglia are unremarkable in appearance. Vascular: No hyperdense vessel or unexpected calcification. Skull: There is no evidence of fracture; visualized osseous structures are unremarkable in appearance. Sinuses/Orbits: The orbits are within normal limits. The paranasal sinuses and mastoid air cells are well-aerated. Other: A prominent soft tissue defect is noted overlying the left frontal calvarium. CT CERVICAL SPINE FINDINGS Alignment: There is grade 1 anterolisthesis of C4 on C5. Skull base and vertebrae: No acute fracture. No primary bone lesion or focal pathologic process. Soft tissues and spinal canal: No prevertebral fluid or swelling. No visible canal hematoma. Disc levels: Multilevel disc space narrowing is noted along the cervical and upper thoracic spine, with scattered anterior and posterior disc osteophyte complexes at the lower cervical spine. Underlying facet disease is noted. Upper  chest: Mild scarring and calcification are noted at the right lung apex. The thyroid gland is unremarkable. Other: No additional soft tissue abnormalities are seen. IMPRESSION: 1. Large intraparenchymal hematoma at the left frontal lobe measures 5.2 x 3.5 cm, increased in size from 4.6 x 3.5 x 3.4 cm on the recent prior study. Trace adjacent subarachnoid hemorrhage, and 3 mm of rightward midline shift. 2. No evidence of fracture or subluxation along the cervical spine. 3. Prominent soft tissue defect overlying the left frontal calvarium. 4. Mild to moderate cortical volume loss and diffuse small vessel ischemic microangiopathy. 5. Mild degenerative change along the cervical spine, with grade 1 anterolisthesis of C4 on C5, stable from prior studies. 6. Mild scarring and calcification at the right lung apex. These results were called by telephone at the time of interpretation on  02/11/2017 at 12:52 am to Dr. Zadie RhineNALD Jacquie Lukes, who verbally acknowledged these results. Electronically Signed   By: Roanna RaiderJeffery  Chang M.D.   On: 02/11/2017 00:54   Ct Cervical Spine Wo Contrast  Result Date: 02/11/2017 CLINICAL DATA:  Status post fall forward. Left forehead laceration. Recent intracranial hemorrhage. Concern for cervical spine injury. EXAM: CT HEAD WITHOUT CONTRAST CT CERVICAL SPINE WITHOUT CONTRAST TECHNIQUE: Multidetector CT imaging of the head and cervical spine was performed following the standard protocol without intravenous contrast. Multiplanar CT image reconstructions of the cervical spine were also generated. COMPARISON:  CT of the head performed 02/07/2017, and MRI/MRV of the brain performed 10/20/2016. CT of the cervical spine performed 10/10/2016 FINDINGS: CT HEAD FINDINGS Brain: A large intraparenchymal hematoma is noted at the left frontal lobe, measuring 5.2 x 3.5 cm, increased in size from the prior study. Surrounding edema is noted, with trace adjacent subarachnoid hemorrhage. Approximately 3 mm of rightward  midline shift is noted. Prominence of the ventricles and sulci reflects mild to moderate cortical volume loss. Diffuse periventricular and subcortical white matter change likely reflects small vessel ischemic microangiopathy. The brainstem and fourth ventricle are within normal limits. The basal ganglia are unremarkable in appearance. Vascular: No hyperdense vessel or unexpected calcification. Skull: There is no evidence of fracture; visualized osseous structures are unremarkable in appearance. Sinuses/Orbits: The orbits are within normal limits. The paranasal sinuses and mastoid air cells are well-aerated. Other: A prominent soft tissue defect is noted overlying the left frontal calvarium. CT CERVICAL SPINE FINDINGS Alignment: There is grade 1 anterolisthesis of C4 on C5. Skull base and vertebrae: No acute fracture. No primary bone lesion or focal pathologic process. Soft tissues and spinal canal: No prevertebral fluid or swelling. No visible canal hematoma. Disc levels: Multilevel disc space narrowing is noted along the cervical and upper thoracic spine, with scattered anterior and posterior disc osteophyte complexes at the lower cervical spine. Underlying facet disease is noted. Upper chest: Mild scarring and calcification are noted at the right lung apex. The thyroid gland is unremarkable. Other: No additional soft tissue abnormalities are seen. IMPRESSION: 1. Large intraparenchymal hematoma at the left frontal lobe measures 5.2 x 3.5 cm, increased in size from 4.6 x 3.5 x 3.4 cm on the recent prior study. Trace adjacent subarachnoid hemorrhage, and 3 mm of rightward midline shift. 2. No evidence of fracture or subluxation along the cervical spine. 3. Prominent soft tissue defect overlying the left frontal calvarium. 4. Mild to moderate cortical volume loss and diffuse small vessel ischemic microangiopathy. 5. Mild degenerative change along the cervical spine, with grade 1 anterolisthesis of C4 on C5, stable  from prior studies. 6. Mild scarring and calcification at the right lung apex. These results were called by telephone at the time of interpretation on 02/11/2017 at 12:52 am to Dr. Zadie RhineNALD Ramon Zanders, who verbally acknowledged these results. Electronically Signed   By: Roanna RaiderJeffery  Chang M.D.   On: 02/11/2017 00:54    Procedures .Marland Kitchen.Laceration Repair Date/Time: 02/11/2017 2:43 AM Performed by: Zadie RhineWickline, Lavante Toso, MD Authorized by: Zadie RhineWickline, Azeneth Carbonell, MD   Consent:    Consent obtained:  Verbal   Consent given by:  Patient (daughter)   Risks discussed:  Pain Anesthesia (see MAR for exact dosages):    Anesthesia method:  Local infiltration   Local anesthetic:  Lidocaine 1% WITH epi Laceration details:    Location:  Scalp   Scalp location:  Frontal   Length (cm):  6 Repair type:    Repair type:  Intermediate  Pre-procedure details:    Preparation:  Patient was prepped and draped in usual sterile fashion Exploration:    Contaminated: no   Treatment:    Area cleansed with:  Shur-Clens   Amount of cleaning:  Standard Skin repair:    Repair method:  Sutures   Suture size:  3-0   Suture material:  Prolene   Number of sutures:  8 Approximation:    Approximation:  Close   Vermilion border: well-aligned   Post-procedure details:    Patient tolerance of procedure:  Tolerated well, no immediate complications    Medications Ordered in ED Medications  Tdap (BOOSTRIX) injection 0.5 mL (0.5 mLs Intramuscular Given 02/11/17 0013)  lidocaine-EPINEPHrine (XYLOCAINE W/EPI) 2 %-1:100000 (with pres) injection 20 mL (20 mLs Infiltration Given 02/11/17 0244)     Initial Impression / Assessment and Plan / ED Course  I have reviewed the triage vital signs and the nursing notes.  Pertinent labs & imaging results that were available during my care of the patient were reviewed by me and considered in my medical decision making (see chart for details).     Patient with recent admission due to hemorrhagic  stroke She is not on anticoagulants at this time I am unable to safely clear her C-spine due to dementia Will obtain CT head and CT C-spine and then repair laceration 1:21 AM CT head results reveal worsening head injury, and intraparenchymal bleed Neuro surgery has been consult Discussed with daughter, patient is a DNR/DNI,she would not want surgery for this condition 1:53 AM I spoke to on-call neurosurgery Dr. Lovell SheehanJenkins he does not recommend any acute intervention or admission or repeat imaging Spoke to Dr. Wilford CornerArora with neurology He reviewed CT head tonight and from previous admission He does not feel there is any acute worsening, and he does not feel that any admission or acute intervention is required 2:48 AM Patient stable, awake alert, interactive Daughter feels comfortable with plan of discharge We will follow-up with neurology next month She would need to have close BP monitoring while in the nursing home Per reports she is allergic to potassium so this was withheld but this will need to be rechecked in the  nursing home  Final Clinical Impressions(s) / ED Diagnoses   Final diagnoses:  Injury of head, initial encounter  Laceration of scalp, initial encounter  Hypokalemia    ED Discharge Orders    None       Zadie RhineWickline, Keian Odriscoll, MD 02/11/17 [phone]

## 2017-02-11 ENCOUNTER — Other Ambulatory Visit: Payer: Self-pay

## 2017-02-11 DIAGNOSIS — S199XXA Unspecified injury of neck, initial encounter: Secondary | ICD-10-CM | POA: Diagnosis not present

## 2017-02-11 DIAGNOSIS — S0191XA Laceration without foreign body of unspecified part of head, initial encounter: Secondary | ICD-10-CM | POA: Diagnosis not present

## 2017-02-11 DIAGNOSIS — I482 Chronic atrial fibrillation: Secondary | ICD-10-CM | POA: Diagnosis not present

## 2017-02-11 DIAGNOSIS — R2689 Other abnormalities of gait and mobility: Secondary | ICD-10-CM | POA: Diagnosis not present

## 2017-02-11 DIAGNOSIS — G8911 Acute pain due to trauma: Secondary | ICD-10-CM | POA: Diagnosis not present

## 2017-02-11 DIAGNOSIS — Z9181 History of falling: Secondary | ICD-10-CM | POA: Diagnosis not present

## 2017-02-11 DIAGNOSIS — I480 Paroxysmal atrial fibrillation: Secondary | ICD-10-CM | POA: Diagnosis not present

## 2017-02-11 DIAGNOSIS — Z8673 Personal history of transient ischemic attack (TIA), and cerebral infarction without residual deficits: Secondary | ICD-10-CM | POA: Diagnosis not present

## 2017-02-11 DIAGNOSIS — E039 Hypothyroidism, unspecified: Secondary | ICD-10-CM | POA: Diagnosis not present

## 2017-02-11 DIAGNOSIS — R69 Illness, unspecified: Secondary | ICD-10-CM | POA: Diagnosis not present

## 2017-02-11 DIAGNOSIS — E876 Hypokalemia: Secondary | ICD-10-CM | POA: Diagnosis not present

## 2017-02-11 DIAGNOSIS — S0101XA Laceration without foreign body of scalp, initial encounter: Secondary | ICD-10-CM | POA: Diagnosis not present

## 2017-02-11 DIAGNOSIS — I10 Essential (primary) hypertension: Secondary | ICD-10-CM | POA: Diagnosis not present

## 2017-02-11 DIAGNOSIS — R41841 Cognitive communication deficit: Secondary | ICD-10-CM | POA: Diagnosis not present

## 2017-02-11 DIAGNOSIS — M6281 Muscle weakness (generalized): Secondary | ICD-10-CM | POA: Diagnosis not present

## 2017-02-11 DIAGNOSIS — I69191 Dysphagia following nontraumatic intracerebral hemorrhage: Secondary | ICD-10-CM | POA: Diagnosis not present

## 2017-02-11 DIAGNOSIS — R2681 Unsteadiness on feet: Secondary | ICD-10-CM | POA: Diagnosis not present

## 2017-02-11 DIAGNOSIS — Z79899 Other long term (current) drug therapy: Secondary | ICD-10-CM | POA: Diagnosis not present

## 2017-02-11 DIAGNOSIS — R1312 Dysphagia, oropharyngeal phase: Secondary | ICD-10-CM | POA: Diagnosis not present

## 2017-02-11 DIAGNOSIS — W010XXA Fall on same level from slipping, tripping and stumbling without subsequent striking against object, initial encounter: Secondary | ICD-10-CM | POA: Diagnosis not present

## 2017-02-11 DIAGNOSIS — I615 Nontraumatic intracerebral hemorrhage, intraventricular: Secondary | ICD-10-CM | POA: Diagnosis not present

## 2017-02-11 DIAGNOSIS — R4182 Altered mental status, unspecified: Secondary | ICD-10-CM | POA: Diagnosis not present

## 2017-02-11 DIAGNOSIS — S0990XA Unspecified injury of head, initial encounter: Secondary | ICD-10-CM | POA: Diagnosis not present

## 2017-02-11 LAB — CBC
HCT: 34.4 % — ABNORMAL LOW (ref 36.0–46.0)
HEMOGLOBIN: 11 g/dL — AB (ref 12.0–15.0)
MCH: 29.1 pg (ref 26.0–34.0)
MCHC: 32 g/dL (ref 30.0–36.0)
MCV: 91 fL (ref 78.0–100.0)
Platelets: 281 10*3/uL (ref 150–400)
RBC: 3.78 MIL/uL — ABNORMAL LOW (ref 3.87–5.11)
RDW: 14.2 % (ref 11.5–15.5)
WBC: 16.2 10*3/uL — ABNORMAL HIGH (ref 4.0–10.5)

## 2017-02-11 LAB — COMPREHENSIVE METABOLIC PANEL
ALBUMIN: 3.1 g/dL — AB (ref 3.5–5.0)
ALT: 25 U/L (ref 14–54)
ANION GAP: 9 (ref 5–15)
AST: 34 U/L (ref 15–41)
Alkaline Phosphatase: 89 U/L (ref 38–126)
BILIRUBIN TOTAL: 0.5 mg/dL (ref 0.3–1.2)
BUN: 26 mg/dL — ABNORMAL HIGH (ref 6–20)
CHLORIDE: 98 mmol/L — AB (ref 101–111)
CO2: 27 mmol/L (ref 22–32)
Calcium: 8.5 mg/dL — ABNORMAL LOW (ref 8.9–10.3)
Creatinine, Ser: 0.91 mg/dL (ref 0.44–1.00)
GFR calc Af Amer: >60 mL/min (ref >60)
GFR, EST NON AFRICAN AMERICAN: 57 mL/min — AB (ref >60)
Glucose, Bld: 120 mg/dL — ABNORMAL HIGH (ref 65–99)
POTASSIUM: 2.8 mmol/L — AB (ref 3.5–5.1)
Sodium: 134 mmol/L — ABNORMAL LOW (ref 135–145)
TOTAL PROTEIN: 5.8 g/dL — AB (ref 6.5–8.1)

## 2017-02-11 LAB — DIFFERENTIAL
BASOS ABS: 0 10*3/uL (ref 0.0–0.1)
BASOS PCT: 0 %
EOS ABS: 0.1 10*3/uL (ref 0.0–0.7)
EOS PCT: 0 %
LYMPHS ABS: 3.1 10*3/uL (ref 0.7–4.0)
Lymphocytes Relative: 19 %
MONOS PCT: 7 %
Monocytes Absolute: 1.1 10*3/uL — ABNORMAL HIGH (ref 0.1–1.0)
NEUTROS PCT: 74 %
Neutro Abs: 11.9 10*3/uL — ABNORMAL HIGH (ref 1.7–7.7)

## 2017-02-11 LAB — APTT: APTT: 27 s (ref 24–36)

## 2017-02-11 LAB — PROTIME-INR
INR: 1.06
Prothrombin Time: 13.7 seconds (ref 11.4–15.2)

## 2017-02-11 MED ORDER — LIDOCAINE-EPINEPHRINE 2 %-1:100000 IJ SOLN
20.0000 mL | Freq: Once | INTRAMUSCULAR | Status: AC
Start: 2017-02-11 — End: 2017-02-11
  Administered 2017-02-11: 20 mL
  Filled 2017-02-11: qty 20

## 2017-02-11 MED ORDER — LISINOPRIL 20 MG PO TABS
40.0000 mg | ORAL_TABLET | Freq: Once | ORAL | Status: AC
  Administered 2017-02-11: 40 mg via ORAL
  Filled 2017-02-11: qty 2

## 2017-02-11 NOTE — ED Notes (Signed)
[facility] Call @ [phone]-per Millie, RN.

## 2017-02-11 NOTE — ED Notes (Signed)
Attempted to call report X 2. 

## 2017-02-11 NOTE — Discharge Instructions (Signed)
Please followup with neurology listed next month Be sure to recheck potassium levels in next 3 days Be sure to keep check on blood pressure and if it continues to be high she may need more medications for her BP

## 2017-02-11 NOTE — ED Notes (Signed)
[facility] arrived to ED to transport patient back to facility.

## 2017-02-11 NOTE — ED Notes (Signed)
[facility] called for transport.  

## 2017-02-11 NOTE — ED Notes (Addendum)
[facility] staff has been here since 0800. They debated for an hour whether to take the patient to [facility] or not due to hypertension and afib. [facility] staff states "she probably has a bleed." shown copy of CT results which did show small bleed and informed that neurology and neurosurgery were consulted and did not feel pt met criteria for intervention. [facility] staff stated they still felt uncomfortable transporting the patient. I spoke with AD Terri at [facility] who was accepting the patient back to the facility and updated on pt CT results. [facility] stated since the facility did not sent a hard copy of her DNR form that they would performed CPR on the patient if she coded in route. Terri from [facility] is now sending a hard copy via transport over to the ED for transport back to the facility. [facility] staff has left. Charge RN Roselyn Reef aware of situation. MD Alvino Chapel consulted while [facility] staff were here to confirm no further interventions would be happening for the patient at the ED.

## 2017-02-11 NOTE — ED Provider Notes (Signed)
While waiting for transfer back to nursing facility, patient had elevation in her blood pressure Nursing told me as well as she is been having episodes of bradycardia than tachycardia When I am in the room her heart rates in the low 100s with intermittent runs up to 03/22/1926, with known history of A. Fib  EKG Interpretation  Date/Time:  Thursday February 11 2017 04:35:33 EST Ventricular Rate:  119 PR Interval:    QRS Duration: 74 QT Interval:  344 QTC Calculation: 484 R Axis:   41 Text Interpretation:  Sinus tachycardia Ventricular bigeminy LVH with secondary repolarization abnormality Confirmed by Zadie RhineWickline, Jovann Luse ([id number]) on 02/11/2017 4:39:31 AM      Repeat EKG is similar to prior We will order her her home lisinopril which is indicated and listed for her despite having a listed allergy for this this was on her discharge instructions and her MAR Currently patient is at baseline and similar to appearance earlier in the night Due to elevated BP, [facility] will not transfer unless BP is lower    Zadie RhineWickline, Chan Sheahan, MD 02/11/17 0500

## 2017-02-11 NOTE — ED Notes (Signed)
Pt. Bed changed and brief changed. Waiting for the arrival of [facility].

## 2017-02-15 DIAGNOSIS — I615 Nontraumatic intracerebral hemorrhage, intraventricular: Secondary | ICD-10-CM | POA: Diagnosis not present

## 2017-02-15 DIAGNOSIS — I480 Paroxysmal atrial fibrillation: Secondary | ICD-10-CM | POA: Diagnosis not present

## 2017-02-15 DIAGNOSIS — Z9181 History of falling: Secondary | ICD-10-CM | POA: Diagnosis not present

## 2017-02-24 DIAGNOSIS — S0191XA Laceration without foreign body of unspecified part of head, initial encounter: Secondary | ICD-10-CM | POA: Diagnosis not present

## 2017-03-02 ENCOUNTER — Emergency Department: Payer: Medicare HMO

## 2017-03-02 ENCOUNTER — Encounter: Payer: Self-pay | Admitting: Emergency Medicine

## 2017-03-02 ENCOUNTER — Emergency Department
Admission: EM | Admit: 2017-03-02 | Discharge: 2017-03-02 | Disposition: A | Discharge status: Home / Self Care | Payer: Medicare HMO | Attending: Emergency Medicine | Admitting: Emergency Medicine

## 2017-03-02 DIAGNOSIS — Z79899 Other long term (current) drug therapy: Secondary | ICD-10-CM | POA: Insufficient documentation

## 2017-03-02 DIAGNOSIS — Z9181 History of falling: Secondary | ICD-10-CM | POA: Diagnosis not present

## 2017-03-02 DIAGNOSIS — S199XXA Unspecified injury of neck, initial encounter: Secondary | ICD-10-CM | POA: Diagnosis not present

## 2017-03-02 DIAGNOSIS — Y9389 Activity, other specified: Secondary | ICD-10-CM | POA: Diagnosis not present

## 2017-03-02 DIAGNOSIS — I10 Essential (primary) hypertension: Secondary | ICD-10-CM | POA: Insufficient documentation

## 2017-03-02 DIAGNOSIS — S098XXA Other specified injuries of head, initial encounter: Secondary | ICD-10-CM | POA: Diagnosis not present

## 2017-03-02 DIAGNOSIS — R69 Illness, unspecified: Secondary | ICD-10-CM | POA: Diagnosis not present

## 2017-03-02 DIAGNOSIS — S0990XA Unspecified injury of head, initial encounter: Secondary | ICD-10-CM | POA: Diagnosis present

## 2017-03-02 DIAGNOSIS — Y999 Unspecified external cause status: Secondary | ICD-10-CM | POA: Diagnosis not present

## 2017-03-02 DIAGNOSIS — W010XXA Fall on same level from slipping, tripping and stumbling without subsequent striking against object, initial encounter: Secondary | ICD-10-CM | POA: Diagnosis not present

## 2017-03-02 DIAGNOSIS — E039 Hypothyroidism, unspecified: Secondary | ICD-10-CM | POA: Insufficient documentation

## 2017-03-02 DIAGNOSIS — F0390 Unspecified dementia without behavioral disturbance: Secondary | ICD-10-CM | POA: Insufficient documentation

## 2017-03-02 DIAGNOSIS — S0101XA Laceration without foreign body of scalp, initial encounter: Secondary | ICD-10-CM | POA: Diagnosis not present

## 2017-03-02 DIAGNOSIS — S0180XA Unspecified open wound of other part of head, initial encounter: Secondary | ICD-10-CM | POA: Diagnosis not present

## 2017-03-02 DIAGNOSIS — S0191XA Laceration without foreign body of unspecified part of head, initial encounter: Secondary | ICD-10-CM | POA: Diagnosis not present

## 2017-03-02 DIAGNOSIS — Y92129 Unspecified place in nursing home as the place of occurrence of the external cause: Secondary | ICD-10-CM | POA: Diagnosis not present

## 2017-03-02 DIAGNOSIS — W19XXXA Unspecified fall, initial encounter: Secondary | ICD-10-CM

## 2017-03-02 LAB — GLUCOSE, CAPILLARY: Glucose-Capillary: 91 mg/dL (ref 65–99)

## 2017-03-02 MED ORDER — LISINOPRIL 10 MG PO TABS
20.0000 mg | ORAL_TABLET | Freq: Once | ORAL | Status: AC
  Administered 2017-03-02: 20 mg via ORAL
  Filled 2017-03-02: qty 2

## 2017-03-02 NOTE — ED Triage Notes (Signed)
Pt comes into the ED via [facility] from [facility] where she had an unwitnessed fall and hit her head on the right side of the head.  Patient fell a couple of days ago as well and opened up the previous laceration that was already there.  Patient is alert and oriented x4 and in NAD with even and unlabored respirations. Patient has stable VS but new onset a-fib according to EMS.

## 2017-03-02 NOTE — ED Provider Notes (Signed)
[facility] Emergency Department Provider Note  ___________________________________________   First MD Initiated Contact with Patient 03/02/17 1811     (approximate)  I have reviewed the triage vital signs and the nursing notes.   HISTORY  Chief Complaint Fall    HPI Ebony [name] is a 84 y.o. female history of a recent intraparenchymal hemorrhage with some subarachnoid extension, dementia, falls and hypertension  Patient presents today, she had an unwitnessed fall at her care facility.  The patient reports that she stumbled, seems to have fairly good recollection of the event, reports that she was attempting to bend fell forward, landing on her left forehead.  EMS was called, they noted a laceration of her left forehead, decision made to transport her for further evaluation.  She denies neck pain.  No numbness tingling or weakness.  Denies headache.  Reports that her left forehead feels slightly sore and that she has a "cut on top of an old 1".  Denies chest pain or trouble breathing.  No abdominal pain.  Denies injury to her arms legs hips etc.  No other injury.  Past Medical History:  Diagnosis Date  . Acute ischemic stroke (HCC) 04/11/2012  . Allergic rhinitis   . Anxiety   . Arthritis    Knees  . Dementia   . Depression   . Fibrocystic breast disease   . Hiatal hernia   . Hyperlipidemia   . Hypertension   . IFG (impaired fasting glucose)   . SDH (subdural hematoma) (HCC) 05/16/2011  . Subarachnoid hemorrhage following injury (HCC) 05/16/2011  . Subarachnoid hemorrhage following injury (HCC) 05/16/2011  . Unspecified hypothyroidism 04/15/2012    Patient Active Problem List   Diagnosis Date Noted  . ICH (intracerebral hemorrhage) (HCC) 02/06/2017  . Urinary tract infection due to Enterococcus   . Intraventricular hemorrhage (HCC)   . History of recent fall   . Vascular dementia, uncomplicated   . Acute cystitis without hematuria   .  Intracranial hemorrhage (HCC) 10/20/2016  . Hyponatremia 10/08/2016  . Hypertensive urgency 10/08/2016  . Polypharmacy   . Paroxysmal supraventricular tachycardia (HCC)   . Recurrent falls 09/04/2016  . Maxillary sinus fracture (HCC) 09/04/2016  . Closed fracture of nasal bone 09/04/2016  . Acute encephalopathy   . Expressive aphasia   . History of TIA (transient ischemic attack) and stroke 06/24/2016  . Depression with anxiety 06/24/2016  . Hypothyroid 06/24/2016  . GERD (gastroesophageal reflux disease) 06/24/2016  . Allergic rhinitis 06/08/2014  . DDD (degenerative disc disease), lumbar 06/08/2014  . Insomnia 12/07/2013  . Depression   . OA (osteoarthritis) 01/06/2013  . Loss of weight 07/13/2012  . Vascular dementia without behavioral disturbance 06/20/2012  . Hypothyroidism due to acquired atrophy of thyroid 04/15/2012  . Essential hypertension 05/16/2011  . Anxiety disorder 05/16/2011    Past Surgical History:  Procedure Laterality Date  . ABDOMINAL HYSTERECTOMY    . BREAST SURGERY    . EYE SURGERY    . TONSILLECTOMY      Prior to Admission medications   Medication Sig Start Date End Date Taking? Authorizing Provider  atorvastatin (LIPITOR) 20 MG tablet Take 1 tablet (20 mg total) by mouth daily at 6 PM. 02/09/17  Yes Costello, Lamar Blinks, NP  levothyroxine (SYNTHROID, LEVOTHROID) 25 MCG tablet Take 1 tablet (25 mcg total) by mouth daily before breakfast. 04/15/12  Yes Christiane Ha, MD  lisinopril (PRINIVIL,ZESTRIL) 40 MG tablet Take 40 mg by mouth daily. 01/25/17  Yes  [provider]  magnesium hydroxide (MILK OF MAGNESIA) 400 MG/5ML suspension Take 30 mLs by mouth daily as needed for mild constipation.   Yes [provider]  Melatonin 3 MG CAPS By mouth qhs for sleep Patient taking differently: Take 3 mg by mouth at bedtime.  11/17/13  Yes Sharon Seller, NP  metoprolol tartrate (LOPRESSOR) 25 MG tablet Take 0.5 tablets (12.5 mg total) by mouth  every 6 (six) hours. 02/09/17  Yes Costello, Lamar Blinks, NP  mirtazapine (REMERON) 15 MG tablet Take 15 mg by mouth at bedtime.   Yes [provider]  Multiple Vitamins-Minerals (THERA-M PO) Take 1 tablet by mouth every morning.   Yes [provider]  pantoprazole (PROTONIX) 20 MG tablet Take 20 mg by mouth every morning.    Yes [provider]  polyethylene glycol (MIRALAX / GLYCOLAX) packet Take 17 g by mouth daily.   Yes [provider]  senna-docusate (SENOKOT-S) 8.6-50 MG tablet Take 1 tablet by mouth daily as needed for mild constipation.   Yes [provider]  fluticasone (FLONASE) 50 MCG/ACT nasal spray Place 2 sprays into both nostrils daily. Patient not taking: Reported on 03/02/2017 06/08/14   Sharon Seller, NP    Allergies Sulfa antibiotics; Biaxin [clarithromycin]; Penicillins; Klor-con [potassium chloride]; Ace inhibitors; and Cozaar [losartan potassium]  Family History  Problem Relation Age of Onset  . Lung cancer Sister   . CVA Neg Hx   . Diabetes Neg Hx   . Hypertension Neg Hx     Social History Social History   Tobacco Use  . Smoking status: Never Smoker  . Smokeless tobacco: Never Used  Substance Use Topics  . Alcohol use: No  . Drug use: No    Review of Systems EM caveat: Dementia limits  Patient's daughters at the bedside, relates that no recent illnesses other than the fall where she had a head bleed.  She has been in her otherwise normal state of health and she appears to be in her normal mental state at present.   ____________________________________________   PHYSICAL EXAM:  VITAL SIGNS: ED Triage Vitals  Enc Vitals Group     BP 03/02/17 1815 (!) 210/92     Pulse Rate 03/02/17 1815 79     Resp 03/02/17 1815 15     Temp 03/02/17 1815 97.7 F (36.5 C)     Temp Source 03/02/17 1815 Oral     SpO2 03/02/17 1815 99 %     Weight 03/02/17 1817 133 lb (60.3 kg)     Height 03/02/17 1817 5\' 4"  (1.626 m)      Head Circumference --      Peak Flow --      Pain Score 03/02/17 1815 1     Pain Loc --      Pain Edu? --      Excl. in GC? --     Constitutional: Alert and oriented to self and daughter, but not to date and location. Well appearing and in no acute distress. Eyes: Conjunctivae are normal. Head: Atraumatic except for approximately quarter sized somewhat stellate appearing laceration over the left forehead without associated hematoma noted. Nose: No congestion/rhinnorhea. Mouth/Throat: Mucous membranes are moist. Neck: No stridor.  No midline cervical tenderness. Cardiovascular: Slightly irregular rate, irregular rhythm. Grossly normal heart sounds.  Good peripheral circulation. Respiratory: Normal respiratory effort.  No retractions. Lungs CTAB. Gastrointestinal: Soft and nontender. No distention. Musculoskeletal: No lower extremity tenderness nor edema.  Moves all extremities  with normal strength to exam, follows commands, no pain or discomfort.  No extremity injuries noted.  All major joints range well without any discomfort or pain.  All extremities appear well perfused, warm peripherally. Neurologic:  Normal speech and language. No gross focal neurologic deficits are appreciated.  Skin:  Skin is warm, dry and intact. No rash noted. Psychiatric: Mood and affect are normal. Speech and behavior are normal.  ____________________________________________   LABS (all labs ordered are listed, but only abnormal results are displayed)  Labs Reviewed  GLUCOSE, CAPILLARY  CBG MONITORING, ED   ____________________________________________  EKG  Reviewed and provided 1815 Heart rate 85 QRS 90 QTC 490 Sinus rhythm with first-degree AV block, no evidence of acute ischemia Probable LVH ____________________________________________  RADIOLOGY  Ct Head Wo Contrast  Result Date: 03/02/2017 CLINICAL DATA:  Status post fall of with laceration of right forehead. EXAM: CT HEAD WITHOUT CONTRAST  CT CERVICAL SPINE WITHOUT CONTRAST TECHNIQUE: Multidetector CT imaging of the head and cervical spine was performed following the standard protocol without intravenous contrast. Multiplanar CT image reconstructions of the cervical spine were also generated. COMPARISON:  February 10, 2017 FINDINGS: CT HEAD FINDINGS Brain: The previously noted large intraparenchymal hematoma of the left frontal lobe is again identified but much decreased in density compared to prior exam. Mild surrounding edema is identified. The previously noted adjacent subarachnoid hemorrhage is less prominent. The previously noted right to left midline shift is decreased and almost completely resolved. No new hemorrhage is identified. There is chronic diffuse atrophy. Chronic bilateral periventricular white matter small vessel ischemic change is noted. Vascular: No hyperdense vessel or unexpected calcification. Skull: Normal. Negative for fracture or focal lesion. Sinuses/Orbits: No acute finding. Other: Left frontal cranium soft tissue defect is identified. CT CERVICAL SPINE FINDINGS Alignment: There is kyphosis of cervical spine. Skull base and vertebrae: No acute fracture. No primary bone lesion or focal pathologic process. Soft tissues and spinal canal: No prevertebral fluid or swelling. No visible canal hematoma. Disc levels: Degenerative joint changes with narrowed joint space and osteophyte formation are identified throughout the cervical spine. Upper chest: Negative. Other: None. IMPRESSION: Left frontal intraparenchymal hemorrhage is decreased in density compared to the prior head CT. The previously noted left subarachnoid hemorrhage is not as well seen today. No new focus of hemorrhage is identified. No acute fracture or dislocation of cervical spine. Degenerative joint changes of cervical spine. Electronically Signed   By: Sherian Rein M.D.   On: 03/02/2017 19:11   Ct Cervical Spine Wo Contrast  Result Date: 03/02/2017 CLINICAL  DATA:  Status post fall of with laceration of right forehead. EXAM: CT HEAD WITHOUT CONTRAST CT CERVICAL SPINE WITHOUT CONTRAST TECHNIQUE: Multidetector CT imaging of the head and cervical spine was performed following the standard protocol without intravenous contrast. Multiplanar CT image reconstructions of the cervical spine were also generated. COMPARISON:  February 10, 2017 FINDINGS: CT HEAD FINDINGS Brain: The previously noted large intraparenchymal hematoma of the left frontal lobe is again identified but much decreased in density compared to prior exam. Mild surrounding edema is identified. The previously noted adjacent subarachnoid hemorrhage is less prominent. The previously noted right to left midline shift is decreased and almost completely resolved. No new hemorrhage is identified. There is chronic diffuse atrophy. Chronic bilateral periventricular white matter small vessel ischemic change is noted. Vascular: No hyperdense vessel or unexpected calcification. Skull: Normal. Negative for fracture or focal lesion. Sinuses/Orbits: No acute finding. Other: Left frontal cranium soft tissue defect is  identified. CT CERVICAL SPINE FINDINGS Alignment: There is kyphosis of cervical spine. Skull base and vertebrae: No acute fracture. No primary bone lesion or focal pathologic process. Soft tissues and spinal canal: No prevertebral fluid or swelling. No visible canal hematoma. Disc levels: Degenerative joint changes with narrowed joint space and osteophyte formation are identified throughout the cervical spine. Upper chest: Negative. Other: None. IMPRESSION: Left frontal intraparenchymal hemorrhage is decreased in density compared to the prior head CT. The previously noted left subarachnoid hemorrhage is not as well seen today. No new focus of hemorrhage is identified. No acute fracture or dislocation of cervical spine. Degenerative joint changes of cervical spine. Electronically Signed   By: Sherian Rein M.D.    On: 03/02/2017 19:11    No evidence of acute injury today, improvement in previous intracranial hemorrhaging ____________________________________________   PROCEDURES  Procedure(s) performed: Laceration  .Marland KitchenLaceration Repair Date/Time: 03/02/2017 8:05 PM Performed by: Sharyn Creamer, MD Authorized by: Sharyn Creamer, MD   Consent:    Consent obtained:  Verbal   Consent given by:  Patient   Risks discussed:  Infection, pain, need for additional repair and poor wound healing (discussed that this laceration is in the scar from old one) Anesthesia (see MAR for exact dosages):    Anesthesia method:  None Laceration details:    Location:  Scalp   Scalp location:  Frontal   Wound length (cm): 4.   Laceration depth: 3. Repair type:    Repair type:  Simple Pre-procedure details:    Preparation:  Imaging obtained to evaluate for foreign bodies Exploration:    Hemostasis achieved with:  Direct pressure   Wound exploration: entire depth of wound probed and visualized     Wound extent: no fascia violation noted, no foreign bodies/material noted, no muscle damage noted, no tendon damage noted and no vascular damage noted     Contaminated: no   Treatment:    Area cleansed with:  Betadine and saline   Amount of cleaning:  Standard   Irrigation solution:  Sterile saline   Visualized foreign bodies/material removed: no   Skin repair:    Repair method:  Tissue adhesive Approximation:    Approximation:  Loose Post-procedure details:    Dressing:  Open (no dressing)   Patient tolerance of procedure:  Tolerated well, no immediate complications    Critical Care performed: No  ____________________________________________   INITIAL IMPRESSION / ASSESSMENT AND PLAN / ED COURSE  Pertinent labs & imaging results that were available during my care of the patient were reviewed by me and considered in my medical decision making (see chart for details).  Patient is for evaluation of fall.   History of multiple falls.  Discussed with family including daughter and patient, goals of care they are to have her laceration repaired to make sure no further bleeding.  She is human amply stable, significant hypertension the family reports this happens whenever she is at the doctor's office and had not taken her evening blood pressure medications.  Clinical Course as of Mar 03 2007  Tue Mar 02, 2017  1610 Discussed with daughter, Darl Pikes. Affirms mother is a DO NOT RESUSCITATE and she had a previous small amount of bleeding from a fall.  She reports they did not do any surgery because they do not wish for any heroic measures and her mother is DO NOT RESUSCITATE.  She is in agreement with repeating a CT of the head today, not performing any blood work as her mother has  a history of frequent falls, and fixing her laceration.   [MQ]    Clinical Course User Index [MQ] Sharyn CreamerQuale, Emilianna Barlowe, MD   ----------------------------------------- 8:07 PM on 03/02/2017 -----------------------------------------  Laceration repaired with good effect.  Discussed return precautions, follow-up recommendations, and plan for her to take her evening blood pressure medications when she gets home.  No signs or symptoms of hypertensive urgency or emergency.  Known history of hypertension with prescriptions at home that she has not yet taken today.  ____________________________________________   FINAL CLINICAL IMPRESSION(S) / ED DIAGNOSES  Final diagnoses:  Fall, initial encounter  Laceration of scalp, initial encounter  Hypertension, unspecified type      NEW MEDICATIONS STARTED DURING THIS VISIT:  This SmartLink is deprecated. Use AVSMEDLIST instead to display the medication list for a patient.   Note:  This document was prepared using Dragon voice recognition software and may include unintentional dictation errors.     Sharyn CreamerQuale, Jannae Fagerstrom, MD 03/02/17 2009

## 2017-03-02 NOTE — ED Notes (Signed)
ED Provider at bedside. 

## 2017-03-03 DIAGNOSIS — N390 Urinary tract infection, site not specified: Secondary | ICD-10-CM | POA: Diagnosis not present

## 2017-03-05 DIAGNOSIS — S0191XA Laceration without foreign body of unspecified part of head, initial encounter: Secondary | ICD-10-CM | POA: Diagnosis not present

## 2017-03-05 DIAGNOSIS — Z9181 History of falling: Secondary | ICD-10-CM | POA: Diagnosis not present

## 2017-03-05 DIAGNOSIS — I10 Essential (primary) hypertension: Secondary | ICD-10-CM | POA: Diagnosis not present

## 2017-03-08 DIAGNOSIS — I10 Essential (primary) hypertension: Secondary | ICD-10-CM | POA: Diagnosis not present

## 2017-03-08 DIAGNOSIS — N390 Urinary tract infection, site not specified: Secondary | ICD-10-CM | POA: Diagnosis not present

## 2017-03-08 DIAGNOSIS — L0390 Cellulitis, unspecified: Secondary | ICD-10-CM | POA: Diagnosis not present

## 2017-03-11 DIAGNOSIS — N390 Urinary tract infection, site not specified: Secondary | ICD-10-CM | POA: Diagnosis not present

## 2017-03-11 DIAGNOSIS — L0390 Cellulitis, unspecified: Secondary | ICD-10-CM | POA: Diagnosis not present

## 2017-03-15 DIAGNOSIS — S0191XA Laceration without foreign body of unspecified part of head, initial encounter: Secondary | ICD-10-CM | POA: Diagnosis not present

## 2017-03-15 DIAGNOSIS — N390 Urinary tract infection, site not specified: Secondary | ICD-10-CM | POA: Diagnosis not present

## 2017-03-15 DIAGNOSIS — I10 Essential (primary) hypertension: Secondary | ICD-10-CM | POA: Diagnosis not present

## 2017-03-15 DIAGNOSIS — L0390 Cellulitis, unspecified: Secondary | ICD-10-CM | POA: Diagnosis not present

## 2017-03-19 DIAGNOSIS — B373 Candidiasis of vulva and vagina: Secondary | ICD-10-CM | POA: Diagnosis not present

## 2017-03-22 DIAGNOSIS — B373 Candidiasis of vulva and vagina: Secondary | ICD-10-CM | POA: Diagnosis not present

## 2017-04-01 DIAGNOSIS — N390 Urinary tract infection, site not specified: Secondary | ICD-10-CM | POA: Diagnosis not present

## 2017-04-02 DIAGNOSIS — R300 Dysuria: Secondary | ICD-10-CM | POA: Diagnosis not present

## 2017-04-13 ENCOUNTER — Ambulatory Visit: Payer: Medicare Other | Admitting: Neurology

## 2017-05-10 DIAGNOSIS — Z8673 Personal history of transient ischemic attack (TIA), and cerebral infarction without residual deficits: Secondary | ICD-10-CM | POA: Diagnosis not present

## 2017-05-10 DIAGNOSIS — R296 Repeated falls: Secondary | ICD-10-CM | POA: Diagnosis not present

## 2017-05-10 DIAGNOSIS — R69 Illness, unspecified: Secondary | ICD-10-CM | POA: Diagnosis not present

## 2017-05-10 DIAGNOSIS — Z8679 Personal history of other diseases of the circulatory system: Secondary | ICD-10-CM | POA: Diagnosis not present

## 2017-05-11 DIAGNOSIS — Z79899 Other long term (current) drug therapy: Secondary | ICD-10-CM | POA: Diagnosis not present

## 2017-06-01 DIAGNOSIS — M79674 Pain in right toe(s): Secondary | ICD-10-CM | POA: Diagnosis not present

## 2017-06-01 DIAGNOSIS — B351 Tinea unguium: Secondary | ICD-10-CM | POA: Diagnosis not present

## 2017-06-01 DIAGNOSIS — L603 Nail dystrophy: Secondary | ICD-10-CM | POA: Diagnosis not present

## 2017-06-01 DIAGNOSIS — M2141 Flat foot [pes planus] (acquired), right foot: Secondary | ICD-10-CM | POA: Diagnosis not present

## 2017-06-01 DIAGNOSIS — I739 Peripheral vascular disease, unspecified: Secondary | ICD-10-CM | POA: Diagnosis not present

## 2017-06-01 DIAGNOSIS — M2142 Flat foot [pes planus] (acquired), left foot: Secondary | ICD-10-CM | POA: Diagnosis not present

## 2017-06-01 DIAGNOSIS — M79675 Pain in left toe(s): Secondary | ICD-10-CM | POA: Diagnosis not present

## 2017-06-05 DIAGNOSIS — G4700 Insomnia, unspecified: Secondary | ICD-10-CM | POA: Diagnosis not present

## 2017-06-05 DIAGNOSIS — R69 Illness, unspecified: Secondary | ICD-10-CM | POA: Diagnosis not present

## 2017-06-22 DIAGNOSIS — I10 Essential (primary) hypertension: Secondary | ICD-10-CM | POA: Diagnosis not present

## 2017-06-22 DIAGNOSIS — M549 Dorsalgia, unspecified: Secondary | ICD-10-CM | POA: Diagnosis not present

## 2017-06-22 DIAGNOSIS — H04123 Dry eye syndrome of bilateral lacrimal glands: Secondary | ICD-10-CM | POA: Diagnosis not present

## 2017-06-22 DIAGNOSIS — I480 Paroxysmal atrial fibrillation: Secondary | ICD-10-CM | POA: Diagnosis not present

## 2017-06-23 DIAGNOSIS — I10 Essential (primary) hypertension: Secondary | ICD-10-CM | POA: Diagnosis not present

## 2017-06-23 DIAGNOSIS — D649 Anemia, unspecified: Secondary | ICD-10-CM | POA: Diagnosis not present

## 2017-06-23 DIAGNOSIS — I482 Chronic atrial fibrillation: Secondary | ICD-10-CM | POA: Diagnosis not present

## 2017-06-23 DIAGNOSIS — S2231XA Fracture of one rib, right side, initial encounter for closed fracture: Secondary | ICD-10-CM | POA: Diagnosis not present

## 2017-06-23 DIAGNOSIS — R0781 Pleurodynia: Secondary | ICD-10-CM | POA: Diagnosis not present

## 2017-06-24 DIAGNOSIS — E1140 Type 2 diabetes mellitus with diabetic neuropathy, unspecified: Secondary | ICD-10-CM | POA: Diagnosis not present

## 2017-06-24 DIAGNOSIS — K922 Gastrointestinal hemorrhage, unspecified: Secondary | ICD-10-CM | POA: Diagnosis not present

## 2017-06-24 DIAGNOSIS — D649 Anemia, unspecified: Secondary | ICD-10-CM | POA: Diagnosis not present

## 2017-06-25 DIAGNOSIS — R079 Chest pain, unspecified: Secondary | ICD-10-CM | POA: Diagnosis not present

## 2017-06-29 ENCOUNTER — Ambulatory Visit: Payer: Medicare HMO | Admitting: Internal Medicine

## 2017-06-30 DIAGNOSIS — I10 Essential (primary) hypertension: Secondary | ICD-10-CM | POA: Diagnosis not present

## 2017-06-30 DIAGNOSIS — D509 Iron deficiency anemia, unspecified: Secondary | ICD-10-CM | POA: Diagnosis not present

## 2017-06-30 DIAGNOSIS — R195 Other fecal abnormalities: Secondary | ICD-10-CM | POA: Diagnosis not present

## 2017-06-30 DIAGNOSIS — D519 Vitamin B12 deficiency anemia, unspecified: Secondary | ICD-10-CM | POA: Diagnosis not present

## 2017-07-05 ENCOUNTER — Telehealth: Payer: Self-pay | Admitting: Gastroenterology

## 2017-07-06 DIAGNOSIS — D649 Anemia, unspecified: Secondary | ICD-10-CM | POA: Diagnosis not present

## 2017-07-06 DIAGNOSIS — I10 Essential (primary) hypertension: Secondary | ICD-10-CM | POA: Diagnosis not present

## 2017-07-06 NOTE — Telephone Encounter (Signed)
It is not recommended to start doing colorectal cancer screening on anyone over age 82.  It is not clear why an 84 year old had Cologuard testing done.  Reviewed multiple ER visits secondary to fall with facial laceration and multiple injuries within past 1 year.  Patient is at risk for potential complications related to procedure or anesthesia, hence do not recommend colonoscopy.  PMD can consider virtual colonoscopy to exclude malignancy but if she has small polyps does not need follow-up colonoscopy.

## 2017-07-08 NOTE — Telephone Encounter (Signed)
Left a message for [facility].

## 2017-07-13 DIAGNOSIS — D649 Anemia, unspecified: Secondary | ICD-10-CM | POA: Diagnosis not present

## 2017-07-13 DIAGNOSIS — G4700 Insomnia, unspecified: Secondary | ICD-10-CM | POA: Diagnosis not present

## 2017-07-13 DIAGNOSIS — I482 Chronic atrial fibrillation: Secondary | ICD-10-CM | POA: Diagnosis not present

## 2017-07-13 DIAGNOSIS — I10 Essential (primary) hypertension: Secondary | ICD-10-CM | POA: Diagnosis not present

## 2017-07-14 DIAGNOSIS — D649 Anemia, unspecified: Secondary | ICD-10-CM | POA: Diagnosis not present

## 2017-07-14 DIAGNOSIS — D518 Other vitamin B12 deficiency anemias: Secondary | ICD-10-CM | POA: Diagnosis not present

## 2017-07-15 DIAGNOSIS — D519 Vitamin B12 deficiency anemia, unspecified: Secondary | ICD-10-CM | POA: Diagnosis not present

## 2017-07-16 DIAGNOSIS — Z79899 Other long term (current) drug therapy: Secondary | ICD-10-CM | POA: Diagnosis not present

## 2017-07-19 DIAGNOSIS — D649 Anemia, unspecified: Secondary | ICD-10-CM | POA: Diagnosis not present

## 2017-07-23 ENCOUNTER — Ambulatory Visit: Payer: Medicare HMO | Admitting: Internal Medicine

## 2017-08-02 DIAGNOSIS — H5789 Other specified disorders of eye and adnexa: Secondary | ICD-10-CM | POA: Diagnosis not present

## 2017-08-03 DIAGNOSIS — H5789 Other specified disorders of eye and adnexa: Secondary | ICD-10-CM | POA: Diagnosis not present

## 2017-08-19 DIAGNOSIS — R69 Illness, unspecified: Secondary | ICD-10-CM | POA: Diagnosis not present

## 2017-08-19 DIAGNOSIS — G4700 Insomnia, unspecified: Secondary | ICD-10-CM | POA: Diagnosis not present

## 2017-08-19 DIAGNOSIS — F0150 Vascular dementia without behavioral disturbance: Secondary | ICD-10-CM | POA: Diagnosis not present

## 2017-08-23 DIAGNOSIS — H43813 Vitreous degeneration, bilateral: Secondary | ICD-10-CM | POA: Diagnosis not present

## 2017-08-23 DIAGNOSIS — H04123 Dry eye syndrome of bilateral lacrimal glands: Secondary | ICD-10-CM | POA: Diagnosis not present

## 2017-08-23 DIAGNOSIS — H35013 Changes in retinal vascular appearance, bilateral: Secondary | ICD-10-CM | POA: Diagnosis not present

## 2017-08-23 DIAGNOSIS — Z961 Presence of intraocular lens: Secondary | ICD-10-CM | POA: Diagnosis not present

## 2017-08-31 DIAGNOSIS — E039 Hypothyroidism, unspecified: Secondary | ICD-10-CM | POA: Diagnosis not present

## 2017-08-31 DIAGNOSIS — D519 Vitamin B12 deficiency anemia, unspecified: Secondary | ICD-10-CM | POA: Diagnosis not present

## 2017-08-31 DIAGNOSIS — D649 Anemia, unspecified: Secondary | ICD-10-CM | POA: Diagnosis not present

## 2017-08-31 DIAGNOSIS — I10 Essential (primary) hypertension: Secondary | ICD-10-CM | POA: Diagnosis not present

## 2017-09-09 DIAGNOSIS — I2510 Atherosclerotic heart disease of native coronary artery without angina pectoris: Secondary | ICD-10-CM | POA: Diagnosis not present

## 2017-09-09 DIAGNOSIS — E039 Hypothyroidism, unspecified: Secondary | ICD-10-CM | POA: Diagnosis not present

## 2017-09-09 DIAGNOSIS — D519 Vitamin B12 deficiency anemia, unspecified: Secondary | ICD-10-CM | POA: Diagnosis not present

## 2017-09-10 DIAGNOSIS — D519 Vitamin B12 deficiency anemia, unspecified: Secondary | ICD-10-CM | POA: Diagnosis not present

## 2017-09-10 DIAGNOSIS — I10 Essential (primary) hypertension: Secondary | ICD-10-CM | POA: Diagnosis not present

## 2017-09-10 DIAGNOSIS — J302 Other seasonal allergic rhinitis: Secondary | ICD-10-CM | POA: Diagnosis not present

## 2017-09-10 DIAGNOSIS — I482 Chronic atrial fibrillation: Secondary | ICD-10-CM | POA: Diagnosis not present

## 2017-09-16 DIAGNOSIS — I10 Essential (primary) hypertension: Secondary | ICD-10-CM | POA: Diagnosis not present

## 2017-09-17 DIAGNOSIS — E871 Hypo-osmolality and hyponatremia: Secondary | ICD-10-CM | POA: Diagnosis not present

## 2017-09-17 DIAGNOSIS — I10 Essential (primary) hypertension: Secondary | ICD-10-CM | POA: Diagnosis not present

## 2017-09-23 ENCOUNTER — Emergency Department (HOSPITAL_COMMUNITY): Payer: Medicare HMO

## 2017-09-23 ENCOUNTER — Other Ambulatory Visit: Payer: Self-pay

## 2017-09-23 ENCOUNTER — Inpatient Hospital Stay (HOSPITAL_COMMUNITY): Payer: Medicare HMO

## 2017-09-23 ENCOUNTER — Inpatient Hospital Stay (HOSPITAL_COMMUNITY)
Admission: EM | Admit: 2017-09-23 | Discharge: 2017-09-29 | DRG: 643 | Disposition: A | Discharge status: Skilled Nursing Facility | Payer: Medicare HMO | Source: Skilled Nursing Facility | Attending: Internal Medicine | Admitting: Internal Medicine

## 2017-09-23 ENCOUNTER — Encounter (HOSPITAL_COMMUNITY): Payer: Self-pay | Admitting: Emergency Medicine

## 2017-09-23 DIAGNOSIS — I160 Hypertensive urgency: Secondary | ICD-10-CM | POA: Diagnosis not present

## 2017-09-23 DIAGNOSIS — R1312 Dysphagia, oropharyngeal phase: Secondary | ICD-10-CM | POA: Diagnosis not present

## 2017-09-23 DIAGNOSIS — E876 Hypokalemia: Secondary | ICD-10-CM

## 2017-09-23 DIAGNOSIS — Z881 Allergy status to other antibiotic agents status: Secondary | ICD-10-CM

## 2017-09-23 DIAGNOSIS — R4189 Other symptoms and signs involving cognitive functions and awareness: Secondary | ICD-10-CM | POA: Diagnosis present

## 2017-09-23 DIAGNOSIS — E039 Hypothyroidism, unspecified: Secondary | ICD-10-CM

## 2017-09-23 DIAGNOSIS — E669 Obesity, unspecified: Secondary | ICD-10-CM | POA: Diagnosis present

## 2017-09-23 DIAGNOSIS — G9341 Metabolic encephalopathy: Secondary | ICD-10-CM | POA: Diagnosis not present

## 2017-09-23 DIAGNOSIS — G9349 Other encephalopathy: Secondary | ICD-10-CM | POA: Diagnosis not present

## 2017-09-23 DIAGNOSIS — R58 Hemorrhage, not elsewhere classified: Secondary | ICD-10-CM | POA: Diagnosis not present

## 2017-09-23 DIAGNOSIS — F0150 Vascular dementia without behavioral disturbance: Secondary | ICD-10-CM | POA: Diagnosis present

## 2017-09-23 DIAGNOSIS — Z79890 Hormone replacement therapy (postmenopausal): Secondary | ICD-10-CM

## 2017-09-23 DIAGNOSIS — R29704 NIHSS score 4: Secondary | ICD-10-CM | POA: Diagnosis present

## 2017-09-23 DIAGNOSIS — R1310 Dysphagia, unspecified: Secondary | ICD-10-CM | POA: Diagnosis present

## 2017-09-23 DIAGNOSIS — Z888 Allergy status to other drugs, medicaments and biological substances status: Secondary | ICD-10-CM

## 2017-09-23 DIAGNOSIS — R69 Illness, unspecified: Secondary | ICD-10-CM | POA: Diagnosis not present

## 2017-09-23 DIAGNOSIS — S065X0D Traumatic subdural hemorrhage without loss of consciousness, subsequent encounter: Secondary | ICD-10-CM | POA: Diagnosis not present

## 2017-09-23 DIAGNOSIS — R778 Other specified abnormalities of plasma proteins: Secondary | ICD-10-CM

## 2017-09-23 DIAGNOSIS — R9431 Abnormal electrocardiogram [ECG] [EKG]: Secondary | ICD-10-CM

## 2017-09-23 DIAGNOSIS — I680 Cerebral amyloid angiopathy: Secondary | ICD-10-CM | POA: Diagnosis not present

## 2017-09-23 DIAGNOSIS — F0390 Unspecified dementia without behavioral disturbance: Secondary | ICD-10-CM

## 2017-09-23 DIAGNOSIS — R488 Other symbolic dysfunctions: Secondary | ICD-10-CM | POA: Diagnosis not present

## 2017-09-23 DIAGNOSIS — S0291XD Unspecified fracture of skull, subsequent encounter for fracture with routine healing: Secondary | ICD-10-CM | POA: Diagnosis not present

## 2017-09-23 DIAGNOSIS — I10 Essential (primary) hypertension: Secondary | ICD-10-CM | POA: Diagnosis present

## 2017-09-23 DIAGNOSIS — I69351 Hemiplegia and hemiparesis following cerebral infarction affecting right dominant side: Secondary | ICD-10-CM | POA: Diagnosis not present

## 2017-09-23 DIAGNOSIS — M542 Cervicalgia: Secondary | ICD-10-CM | POA: Diagnosis not present

## 2017-09-23 DIAGNOSIS — Z880 Allergy status to penicillin: Secondary | ICD-10-CM

## 2017-09-23 DIAGNOSIS — E871 Hypo-osmolality and hyponatremia: Secondary | ICD-10-CM | POA: Diagnosis not present

## 2017-09-23 DIAGNOSIS — R001 Bradycardia, unspecified: Secondary | ICD-10-CM | POA: Diagnosis not present

## 2017-09-23 DIAGNOSIS — J309 Allergic rhinitis, unspecified: Secondary | ICD-10-CM | POA: Diagnosis present

## 2017-09-23 DIAGNOSIS — Z993 Dependence on wheelchair: Secondary | ICD-10-CM

## 2017-09-23 DIAGNOSIS — R7301 Impaired fasting glucose: Secondary | ICD-10-CM | POA: Diagnosis present

## 2017-09-23 DIAGNOSIS — T502X5A Adverse effect of carbonic-anhydrase inhibitors, benzothiadiazides and other diuretics, initial encounter: Secondary | ICD-10-CM | POA: Diagnosis present

## 2017-09-23 DIAGNOSIS — Z7401 Bed confinement status: Secondary | ICD-10-CM | POA: Diagnosis not present

## 2017-09-23 DIAGNOSIS — R7989 Other specified abnormal findings of blood chemistry: Secondary | ICD-10-CM

## 2017-09-23 DIAGNOSIS — R4701 Aphasia: Secondary | ICD-10-CM | POA: Diagnosis not present

## 2017-09-23 DIAGNOSIS — E222 Syndrome of inappropriate secretion of antidiuretic hormone: Secondary | ICD-10-CM | POA: Diagnosis not present

## 2017-09-23 DIAGNOSIS — J9811 Atelectasis: Secondary | ICD-10-CM | POA: Diagnosis not present

## 2017-09-23 DIAGNOSIS — W19XXXA Unspecified fall, initial encounter: Secondary | ICD-10-CM

## 2017-09-23 DIAGNOSIS — K219 Gastro-esophageal reflux disease without esophagitis: Secondary | ICD-10-CM | POA: Diagnosis not present

## 2017-09-23 DIAGNOSIS — S199XXA Unspecified injury of neck, initial encounter: Secondary | ICD-10-CM | POA: Diagnosis not present

## 2017-09-23 DIAGNOSIS — S0291XA Unspecified fracture of skull, initial encounter for closed fracture: Secondary | ICD-10-CM | POA: Diagnosis present

## 2017-09-23 DIAGNOSIS — I161 Hypertensive emergency: Secondary | ICD-10-CM | POA: Diagnosis present

## 2017-09-23 DIAGNOSIS — Z79899 Other long term (current) drug therapy: Secondary | ICD-10-CM

## 2017-09-23 DIAGNOSIS — R4020 Unspecified coma: Secondary | ICD-10-CM | POA: Diagnosis not present

## 2017-09-23 DIAGNOSIS — F329 Major depressive disorder, single episode, unspecified: Secondary | ICD-10-CM | POA: Diagnosis present

## 2017-09-23 DIAGNOSIS — R220 Localized swelling, mass and lump, head: Secondary | ICD-10-CM | POA: Diagnosis not present

## 2017-09-23 DIAGNOSIS — S06369A Traumatic hemorrhage of cerebrum, unspecified, with loss of consciousness of unspecified duration, initial encounter: Secondary | ICD-10-CM | POA: Diagnosis present

## 2017-09-23 DIAGNOSIS — R296 Repeated falls: Secondary | ICD-10-CM | POA: Diagnosis not present

## 2017-09-23 DIAGNOSIS — Z90710 Acquired absence of both cervix and uterus: Secondary | ICD-10-CM

## 2017-09-23 DIAGNOSIS — I480 Paroxysmal atrial fibrillation: Secondary | ICD-10-CM

## 2017-09-23 DIAGNOSIS — W050XXA Fall from non-moving wheelchair, initial encounter: Secondary | ICD-10-CM | POA: Diagnosis present

## 2017-09-23 DIAGNOSIS — F419 Anxiety disorder, unspecified: Secondary | ICD-10-CM | POA: Diagnosis present

## 2017-09-23 DIAGNOSIS — T17908A Unspecified foreign body in respiratory tract, part unspecified causing other injury, initial encounter: Secondary | ICD-10-CM

## 2017-09-23 DIAGNOSIS — E785 Hyperlipidemia, unspecified: Secondary | ICD-10-CM | POA: Diagnosis present

## 2017-09-23 DIAGNOSIS — R278 Other lack of coordination: Secondary | ICD-10-CM | POA: Diagnosis not present

## 2017-09-23 DIAGNOSIS — M6281 Muscle weakness (generalized): Secondary | ICD-10-CM | POA: Diagnosis not present

## 2017-09-23 DIAGNOSIS — Z66 Do not resuscitate: Secondary | ICD-10-CM | POA: Diagnosis not present

## 2017-09-23 DIAGNOSIS — Z6831 Body mass index (BMI) 31.0-31.9, adult: Secondary | ICD-10-CM

## 2017-09-23 DIAGNOSIS — I4891 Unspecified atrial fibrillation: Secondary | ICD-10-CM | POA: Diagnosis not present

## 2017-09-23 DIAGNOSIS — Z882 Allergy status to sulfonamides status: Secondary | ICD-10-CM

## 2017-09-23 DIAGNOSIS — S0990XA Unspecified injury of head, initial encounter: Secondary | ICD-10-CM | POA: Diagnosis not present

## 2017-09-23 DIAGNOSIS — I615 Nontraumatic intracerebral hemorrhage, intraventricular: Secondary | ICD-10-CM | POA: Diagnosis not present

## 2017-09-23 DIAGNOSIS — S06369D Traumatic hemorrhage of cerebrum, unspecified, with loss of consciousness of unspecified duration, subsequent encounter: Secondary | ICD-10-CM | POA: Diagnosis not present

## 2017-09-23 DIAGNOSIS — M2550 Pain in unspecified joint: Secondary | ICD-10-CM | POA: Diagnosis not present

## 2017-09-23 DIAGNOSIS — W19XXXD Unspecified fall, subsequent encounter: Secondary | ICD-10-CM | POA: Diagnosis not present

## 2017-09-23 DIAGNOSIS — R55 Syncope and collapse: Secondary | ICD-10-CM | POA: Insufficient documentation

## 2017-09-23 DIAGNOSIS — I619 Nontraumatic intracerebral hemorrhage, unspecified: Secondary | ICD-10-CM

## 2017-09-23 DIAGNOSIS — R2681 Unsteadiness on feet: Secondary | ICD-10-CM | POA: Diagnosis not present

## 2017-09-23 DIAGNOSIS — I4581 Long QT syndrome: Secondary | ICD-10-CM | POA: Diagnosis not present

## 2017-09-23 DIAGNOSIS — K449 Diaphragmatic hernia without obstruction or gangrene: Secondary | ICD-10-CM | POA: Diagnosis present

## 2017-09-23 LAB — TSH: TSH: 1.203 u[IU]/mL (ref 0.350–4.500)

## 2017-09-23 LAB — DIFFERENTIAL
Abs Immature Granulocytes: 0.1 10*3/uL (ref 0.0–0.1)
Basophils Absolute: 0 10*3/uL (ref 0.0–0.1)
Basophils Relative: 0 %
Eosinophils Absolute: 0.1 10*3/uL (ref 0.0–0.7)
Eosinophils Relative: 1 %
Immature Granulocytes: 1 %
Lymphocytes Relative: 26 %
Lymphs Abs: 2.7 10*3/uL (ref 0.7–4.0)
Monocytes Absolute: 0.8 10*3/uL (ref 0.1–1.0)
Monocytes Relative: 8 %
Neutro Abs: 6.7 10*3/uL (ref 1.7–7.7)
Neutrophils Relative %: 64 %

## 2017-09-23 LAB — COMPREHENSIVE METABOLIC PANEL
ALT: 16 U/L (ref 0–44)
AST: 22 U/L (ref 15–41)
Albumin: 3.8 g/dL (ref 3.5–5.0)
Alkaline Phosphatase: 117 U/L (ref 38–126)
Anion gap: 14 (ref 5–15)
BUN: 13 mg/dL (ref 8–23)
CO2: 25 mmol/L (ref 22–32)
Calcium: 9.1 mg/dL (ref 8.9–10.3)
Chloride: 83 mmol/L — ABNORMAL LOW (ref 98–111)
Creatinine, Ser: 1.05 mg/dL — ABNORMAL HIGH (ref 0.44–1.00)
GFR calc Af Amer: 55 mL/min — ABNORMAL LOW (ref >60)
GFR calc non Af Amer: 48 mL/min — ABNORMAL LOW (ref >60)
Glucose, Bld: 126 mg/dL — ABNORMAL HIGH (ref 70–99)
Potassium: 3.5 mmol/L (ref 3.5–5.1)
Sodium: 122 mmol/L — ABNORMAL LOW (ref 135–145)
Total Bilirubin: 0.6 mg/dL (ref 0.3–1.2)
Total Protein: 6.2 g/dL — ABNORMAL LOW (ref 6.5–8.1)

## 2017-09-23 LAB — I-STAT CHEM 8, ED
BUN: 15 mg/dL (ref 8–23)
Calcium, Ion: 1.09 mmol/L — ABNORMAL LOW (ref 1.15–1.40)
Chloride: 82 mmol/L — ABNORMAL LOW (ref 98–111)
Creatinine, Ser: 0.9 mg/dL (ref 0.44–1.00)
Glucose, Bld: 127 mg/dL — ABNORMAL HIGH (ref 70–99)
HCT: 35 % — ABNORMAL LOW (ref 36.0–46.0)
Hemoglobin: 11.9 g/dL — ABNORMAL LOW (ref 12.0–15.0)
Potassium: 3.3 mmol/L — ABNORMAL LOW (ref 3.5–5.1)
Sodium: 118 mmol/L — CL (ref 135–145)
TCO2: 25 mmol/L (ref 22–32)

## 2017-09-23 LAB — BASIC METABOLIC PANEL
Anion gap: 12 (ref 5–15)
BUN: 11 mg/dL (ref 8–23)
CO2: 26 mmol/L (ref 22–32)
Calcium: 8.9 mg/dL (ref 8.9–10.3)
Chloride: 89 mmol/L — ABNORMAL LOW (ref 98–111)
Creatinine, Ser: 0.89 mg/dL (ref 0.44–1.00)
GFR calc Af Amer: >60 mL/min (ref >60)
GFR calc non Af Amer: 58 mL/min — ABNORMAL LOW (ref >60)
Glucose, Bld: 131 mg/dL — ABNORMAL HIGH (ref 70–99)
Potassium: 3.5 mmol/L (ref 3.5–5.1)
Sodium: 127 mmol/L — ABNORMAL LOW (ref 135–145)

## 2017-09-23 LAB — CBC
HCT: 34.3 % — ABNORMAL LOW (ref 36.0–46.0)
Hemoglobin: 11.6 g/dL — ABNORMAL LOW (ref 12.0–15.0)
MCH: 27.4 pg (ref 26.0–34.0)
MCHC: 33.8 g/dL (ref 30.0–36.0)
MCV: 80.9 fL (ref 78.0–100.0)
Platelets: 253 10*3/uL (ref 150–400)
RBC: 4.24 MIL/uL (ref 3.87–5.11)
RDW: 13.9 % (ref 11.5–15.5)
WBC: 10.3 10*3/uL (ref 4.0–10.5)

## 2017-09-23 LAB — APTT: aPTT: 32 seconds (ref 24–36)

## 2017-09-23 LAB — URINALYSIS, ROUTINE W REFLEX MICROSCOPIC
Bilirubin Urine: NEGATIVE
Glucose, UA: NEGATIVE mg/dL
Hgb urine dipstick: NEGATIVE
Ketones, ur: NEGATIVE mg/dL
Leukocytes, UA: NEGATIVE
Nitrite: NEGATIVE
Protein, ur: 30 mg/dL — AB
Specific Gravity, Urine: 1.005 (ref 1.005–1.030)
pH: 7 (ref 5.0–8.0)

## 2017-09-23 LAB — PROTIME-INR
INR: 0.95
Prothrombin Time: 12.6 seconds (ref 11.4–15.2)

## 2017-09-23 LAB — RAPID URINE DRUG SCREEN, HOSP PERFORMED
Amphetamines: NOT DETECTED
Barbiturates: NOT DETECTED
Benzodiazepines: NOT DETECTED
Cocaine: NOT DETECTED
Opiates: NOT DETECTED
Tetrahydrocannabinol: NOT DETECTED

## 2017-09-23 LAB — OSMOLALITY: Osmolality: 252 mOsm/kg — ABNORMAL LOW (ref 275–295)

## 2017-09-23 LAB — CORTISOL: Cortisol, Plasma: 22.6 ug/dL

## 2017-09-23 LAB — ETHANOL: Alcohol, Ethyl (B): <10 mg/dL (ref <10)

## 2017-09-23 LAB — NA AND K (SODIUM & POTASSIUM), RAND UR
Potassium Urine: 16 mmol/L
Sodium, Ur: 99 mmol/L

## 2017-09-23 LAB — OSMOLALITY, URINE: Osmolality, Ur: 287 mOsm/kg — ABNORMAL LOW (ref 300–900)

## 2017-09-23 LAB — I-STAT TROPONIN, ED: Troponin i, poc: 0.01 ng/mL (ref 0.00–0.08)

## 2017-09-23 LAB — PHOSPHORUS: PHOSPHORUS: 2.9 mg/dL (ref 2.5–4.6)

## 2017-09-23 LAB — CREATININE, URINE, RANDOM: Creatinine, Urine: 21.74 mg/dL

## 2017-09-23 LAB — CBG MONITORING, ED: GLUCOSE-CAPILLARY: 130 mg/dL — AB (ref 70–99)

## 2017-09-23 MED ORDER — MELATONIN 3 MG PO TABS
3.0000 mg | ORAL_TABLET | Freq: Every day | ORAL | Status: DC
  Administered 2017-09-25 – 2017-09-28 (×4): 3 mg via ORAL
  Filled 2017-09-23 (×7): qty 1

## 2017-09-23 MED ORDER — ATORVASTATIN CALCIUM 20 MG PO TABS
20.0000 mg | ORAL_TABLET | Freq: Every day | ORAL | Status: DC
  Administered 2017-09-25 – 2017-09-26 (×2): 20 mg via ORAL
  Filled 2017-09-23 (×2): qty 1
  Filled 2017-09-23: qty 2
  Filled 2017-09-23: qty 1
  Filled 2017-09-23: qty 2

## 2017-09-23 MED ORDER — HYDRALAZINE HCL 10 MG PO TABS: 10.0000 mg | ORAL_TABLET | Freq: Four times a day (QID) | ORAL | Status: DC

## 2017-09-23 MED ORDER — POTASSIUM CHLORIDE CRYS ER 20 MEQ PO TBCR: 20.0000 meq | EXTENDED_RELEASE_TABLET | Freq: Two times a day (BID) | ORAL | Status: DC

## 2017-09-23 MED ORDER — ONDANSETRON HCL 4 MG/2ML IJ SOLN
INTRAMUSCULAR | Status: AC
  Administered 2017-09-23: 4 mg via INTRAVENOUS
  Filled 2017-09-23: qty 2

## 2017-09-23 MED ORDER — FLUTICASONE PROPIONATE 50 MCG/ACT NA SUSP
2.0000 | Freq: Every day | NASAL | Status: DC
Start: 2017-09-24 — End: 2017-09-29
  Administered 2017-09-26 – 2017-09-29 (×4): 2 via NASAL
  Filled 2017-09-23: qty 16

## 2017-09-23 MED ORDER — HYPROMELLOSE (GONIOSCOPIC) 2.5 % OP SOLN
1.0000 [drp] | Freq: Every evening | OPHTHALMIC | Status: DC
  Administered 2017-09-24 – 2017-09-28 (×6): 1 [drp] via OPHTHALMIC
  Filled 2017-09-23: qty 15

## 2017-09-23 MED ORDER — POTASSIUM CHLORIDE CRYS ER 20 MEQ PO TBCR: 40.0000 meq | EXTENDED_RELEASE_TABLET | Freq: Once | ORAL | Status: DC

## 2017-09-23 MED ORDER — PANTOPRAZOLE SODIUM 20 MG PO TBEC
20.0000 mg | DELAYED_RELEASE_TABLET | ORAL | Status: DC
  Administered 2017-09-26 – 2017-09-29 (×4): 20 mg via ORAL
  Filled 2017-09-23 (×6): qty 1

## 2017-09-23 MED ORDER — MIRTAZAPINE 15 MG PO TABS: 15.0000 mg | ORAL_TABLET | Freq: Every day | ORAL | Status: DC

## 2017-09-23 MED ORDER — METOPROLOL TARTRATE 25 MG PO TABS: 12.5000 mg | ORAL_TABLET | Freq: Four times a day (QID) | ORAL | Status: DC

## 2017-09-23 MED ORDER — SODIUM CHLORIDE 3 % IV SOLN
Freq: Once | INTRAVENOUS | Status: AC
  Administered 2017-09-23: 75 mL/h via INTRAVENOUS
  Filled 2017-09-23: qty 500

## 2017-09-23 MED ORDER — ONDANSETRON HCL 4 MG/2ML IJ SOLN
4.0000 mg | Freq: Once | INTRAMUSCULAR | Status: AC
  Administered 2017-09-23: 4 mg via INTRAVENOUS

## 2017-09-23 MED ORDER — LEVOTHYROXINE SODIUM 25 MCG PO TABS: 25.0000 ug | ORAL_TABLET | Freq: Every day | ORAL | Status: DC

## 2017-09-23 MED ORDER — STROKE: EARLY STAGES OF RECOVERY BOOK
Freq: Once | Status: DC
  Filled 2017-09-23: qty 1

## 2017-09-23 NOTE — ED Notes (Signed)
Attempted report x2. Patient now back from MRI.

## 2017-09-23 NOTE — H&P (Addendum)
Date: 09/24/2017               Patient Name:  Ebony [name] MRN: [id number]  DOB: 20-Feb-1932 Age / Sex: 84 y.o., female   PCP: Malvin Johns         Medical Service: Internal Medicine Teaching Service         Attending Physician: Dr. Rogelia Boga, Austin Miles, MD    First Contact: Dr. Dortha Schwalbe Pager: [phone]  Second Contact: Dr. Samuella Cota Pager: [phone]       After Hours (After 5p/  First Contact Pager: [phone]  weekends / holidays): Second Contact Pager: [phone]   Chief Complaint: AMS, non-verbal  History of Present Illness: This is a 84 year old female with a PMH of hypertension, paroxysmal atrial fibrillation (not on anticoagulation), HLD, hypothyroidism, CVA, GERD, subarachnoid hemorrhage (01/2017 of left frontal lobe), and dementia, presenting from a ALF for a code stroke. On our evaluation patient was awake but was mute, did not respond to questions and did not follow commands. Information was gathered from the chart. Per the nurse, the patient was answering questions earlier and was able to state her name, the month, and where she was, she also would not follow commands at that time. Her daughter came to visit her and appeared like she didn't know who the daughter was so the daughter left. When the nurse came in the patient started being non-verbal. Per EMS report she was actively playing a bingo game that ended at 1530 and returned the patient to her room at 1540. The staff found her on the floor after an unwitnessed fall from her wheelchair, there was a small contusion on the back of her head but no open wounds. The patient was aphasic and not responding to staff. When the EMS arrived the patient was alert but was mute other then her name. She was found to be hypertensive to 180/100. She is wheelchair bound at baseline.   Per chart review, in 2013 she suffered from a right temporal subdural hematoma and right temporal subarachnoid hemorrhage after a fall, with no neurologic deficits.  In April 2018 she was found to have aphasia and confusion, MRI and EEG showed no acute findings at that time, thought to be TIA/delerium, discharged to a SNF. She suffered multiple falls since then. She was hospitalized in July 2018 for a nasal and maxillary sinus fracture after a traumatic fall that was likely mechanical in nature, CT head showed no acute findings, neuro exam was unremarkable. She was hospitalized in December 2018 for AMS and minimal responsiveness and found to have a large acute left frontal parenchymal hemorrhage which was possibly related to her atrial fibrillation.  In the ED patient hypertensive to 170/135, HR 54, RR 18. Her labs showed a Na of 118, K 3.3, Hgb 11.9, and glucose 127. UDS was negative. EKG showed atrial fibrillation and QT prolongation. CT scan showed no acute intracranial abnormality, encephalomalacia in anterior left frontal lobe, soft tissue swelling in occipital lobe suspect trauma, no fracture, ASPECT of 10/10. She had an episode of emesis while in the CT machine, and was given Zofran. Neurology evaluated patient, workup for stroke vs syncope vs seizure, and allowing for permissive hypertension. She was given hypertonic 3% saline 120cc in the ED. Patient was admitted to internal medicine for further workup and treatment.  Meds:  No current facility-administered medications on file prior to encounter.    Current Outpatient Medications on File Prior to Encounter  Medication Sig  Dispense Refill  . acetaminophen (TYLENOL) 500 MG tablet Take 1,000 mg by mouth every 8 (eight) hours as needed for moderate pain.    Marland Kitchen atorvastatin (LIPITOR) 20 MG tablet Take 1 tablet (20 mg total) by mouth daily at 6 PM. 30 tablet 1  . Carboxymethylcellulose Sod PF 0.25 % SOLN Place 1 drop into both eyes daily.    . fluticasone (FLONASE) 50 MCG/ACT nasal spray Place 2 sprays into both nostrils daily. 16 g 6  . hydrALAZINE (APRESOLINE) 25 MG tablet Take 25 mg by mouth 4 (four) times  daily. Hold for SBP < 120    . hydrochlorothiazide (MICROZIDE) 12.5 MG capsule Take 12.5 mg by mouth daily.    Marland Kitchen levothyroxine (SYNTHROID, LEVOTHROID) 25 MCG tablet Take 1 tablet (25 mcg total) by mouth daily before breakfast.    . lisinopril (PRINIVIL,ZESTRIL) 40 MG tablet Take 40 mg by mouth daily.    . Magnesium Hydroxide 2400 MG/10ML SUSP Take 7,200 mg by mouth daily as needed (for bowl movments; not to exceed 2 time a week).    . Melatonin 3 MG CAPS By mouth qhs for sleep (Patient taking differently: Take 3 mg by mouth at bedtime. )  0  . metoprolol tartrate (LOPRESSOR) 25 MG tablet Take 0.5 tablets (12.5 mg total) by mouth every 6 (six) hours. (Patient taking differently: Take 25 mg by mouth 4 (four) times daily. ) 120 tablet 1  . mirtazapine (REMERON) 15 MG tablet Take 15 mg by mouth at bedtime.    . Multiple Vitamin (MULTIVITAMIN WITH MINERALS) TABS tablet Take 1 tablet by mouth daily.    . Nutritional Supplements (ENSURE CLEAR) LIQD Take 1 each by mouth 2 (two) times daily between meals.    . pantoprazole (PROTONIX) 20 MG tablet Take 20 mg by mouth every morning.     . polyethylene glycol (MIRALAX / GLYCOLAX) packet Take 17 g by mouth daily.    Marland Kitchen Propylene Glycol (SYSTANE BALANCE) 0.6 % SOLN Place 1 drop into both eyes every evening.    . saccharomyces boulardii (FLORASTOR) 250 MG capsule Take 250 mg by mouth 2 (two) times daily.    . vitamin B-12 (CYANOCOBALAMIN) 1000 MCG tablet Take 1,000 mcg by mouth daily.      Current Meds  Medication Sig  . acetaminophen (TYLENOL) 500 MG tablet Take 1,000 mg by mouth every 8 (eight) hours as needed for moderate pain.  Marland Kitchen atorvastatin (LIPITOR) 20 MG tablet Take 1 tablet (20 mg total) by mouth daily at 6 PM.  . Carboxymethylcellulose Sod PF 0.25 % SOLN Place 1 drop into both eyes daily.  . fluticasone (FLONASE) 50 MCG/ACT nasal spray Place 2 sprays into both nostrils daily.  . hydrALAZINE (APRESOLINE) 25 MG tablet Take 25 mg by mouth 4 (four)  times daily. Hold for SBP < 120  . hydrochlorothiazide (MICROZIDE) 12.5 MG capsule Take 12.5 mg by mouth daily.  Marland Kitchen levothyroxine (SYNTHROID, LEVOTHROID) 25 MCG tablet Take 1 tablet (25 mcg total) by mouth daily before breakfast.  . lisinopril (PRINIVIL,ZESTRIL) 40 MG tablet Take 40 mg by mouth daily.  . Magnesium Hydroxide 2400 MG/10ML SUSP Take 7,200 mg by mouth daily as needed (for bowl movments; not to exceed 2 time a week).  . Melatonin 3 MG CAPS By mouth qhs for sleep (Patient taking differently: Take 3 mg by mouth at bedtime. )  . metoprolol tartrate (LOPRESSOR) 25 MG tablet Take 0.5 tablets (12.5 mg total) by mouth every 6 (six) hours. (Patient taking differently: Take  25 mg by mouth 4 (four) times daily. )  . mirtazapine (REMERON) 15 MG tablet Take 15 mg by mouth at bedtime.  . Multiple Vitamin (MULTIVITAMIN WITH MINERALS) TABS tablet Take 1 tablet by mouth daily.  . Nutritional Supplements (ENSURE CLEAR) LIQD Take 1 each by mouth 2 (two) times daily between meals.  . pantoprazole (PROTONIX) 20 MG tablet Take 20 mg by mouth every morning.   . polyethylene glycol (MIRALAX / GLYCOLAX) packet Take 17 g by mouth daily.  Marland Kitchen. Propylene Glycol (SYSTANE BALANCE) 0.6 % SOLN Place 1 drop into both eyes every evening.  . saccharomyces boulardii (FLORASTOR) 250 MG capsule Take 250 mg by mouth 2 (two) times daily.  . vitamin B-12 (CYANOCOBALAMIN) 1000 MCG tablet Take 1,000 mcg by mouth daily.     Allergies: Allergies as of 09/23/2017 - Review Complete 09/23/2017  Allergen Reaction Noted  . Sulfa antibiotics Diarrhea 12/25/2010  . Biaxin [clarithromycin] Diarrhea 08/24/2012  . Penicillins Diarrhea 12/25/2010  . Klor-con [potassium chloride] Other (See Comments) 08/31/2016  . Ace inhibitors  08/24/2012  . Cozaar [losartan potassium] Rash 08/24/2012   Past Medical History:  Diagnosis Date  . Acute ischemic stroke (HCC) 04/11/2012  . Allergic rhinitis   . Anxiety   . Arthritis    Knees  .  Dementia   . Depression   . Fibrocystic breast disease   . Hiatal hernia   . Hyperlipidemia   . Hypertension   . IFG (impaired fasting glucose)   . SDH (subdural hematoma) (HCC) 05/16/2011  . Subarachnoid hemorrhage following injury (HCC) 05/16/2011  . Subarachnoid hemorrhage following injury (HCC) 05/16/2011  . Unspecified hypothyroidism 04/15/2012    Family History:  Family History  Problem Relation Age of Onset  . Lung cancer Sister   . CVA Neg Hx   . Diabetes Neg Hx   . Hypertension Neg Hx      Social History:  Per chart, non-smoker, no etoh use.   Review of Systems: A complete ROS was negative except as per HPI.   Physical Exam: Blood pressure (!) 190/101, pulse 81, resp. rate 19, height 5\' 2"  (1.575 m), weight 126 lb 11.2 oz (57.5 kg), SpO2 98 %. General: Frail, alert, appears stated age and no distress HEENT: PERRLA Heart: S1 and S2 normal, irregular rhythm, no murmurs Lungs: unlabored breathing and rhonchi in right basilar area Abdomen: abdomen is soft without significant tenderness, masses, organomegaly or guarding Extremities: Extremities normal, no edema, no bruises Musculoskeletal: no joint tenderness, deformity or swelling Skin:no rashes, no wounds, no petechia Neurology: Mental status: Patient was alert, non-verbal, did not follow commands. CN: PERRLA, difficult to assess. Motor:  Spontaneous movement of upper extremities, movements after painful stimulation in lower extremities. Reflexes: 1+ in biceps and patella. Sensory: Withdraws to pain bilaterally.    EKG: personally reviewed my interpretation is rate of 60 bmp, irregularly irregular rhythm, no axis deviation, no ST changes, with QT prolongation  CXR: personally reviewed my interpretation is rotated to the left, cardiomegaly, no obvious fractures, obscured left diaphragmatic angle, with left lower lobe atelectasis.   Assessment & Plan by Problem: This is a 84 year old female with a PMH of hypertension,  atrial fibrillation (not on anticoagulation), HLD, hypothyroidism, GERD, CVA, subarachnoid hemorrhage (01/2017 of left frontal lobe), and dementia, presenting for a code stroke after an unwitnessed fall.  Fall: Patient had an unwitnessed fall, after which she was aphasic and unresponsive to staff. She had been actively engaged in bingo a few hours earlier.  She was found to be hypertensive to 180/100 by EMS, and 170/135 when she arrived to the ED. On exam she was aphasic and did not follow commands, no facial droop noted, had spontaneous movement of upper extremities and responded to stimuli in lower extremities, she appeared euvolemic. Her labs were significant for hyponatremia to 118. CT scan showed no acute intracranial abnormalities. She has a baseline deficit of wheelchair bound, cognitive impairment (MoCA 2018 was 9), and right sided weakness. Patient has had multiple falls. This could be due to symptomatic hyponatremia which could have resulted in a seizure, the fluctuating aphasia could be due to a post-ictal state. Neurology evaluated and would like to rule out stroke vs seizure vs syncope. Patient does have a history of atrial fibrillation and is not on anticoagulation so there is a concern for a embolic stroke.  -Neurology consult -Allow for permissive HTN -ASA 325mg  daily -EEG -MRI -q4 neuro checks - A1c and lipid panel -Trend troponin -EKG -Tele monitoring  -SLP eval -PT/OT  #Severe hypotonic hyponatremia: Patient's Na on admission was 118. She appeared euvolemic on exam with moist mucous membranes and normal skin turgor. However she does have 2 recorded episodes of emesis. Patients cortisol and TSH was WNL. Labs were significant for serum osmolality of 252, and a urine osmolality of 287. This is likely SIADH. This could be medication induced SIADH, patient is on mirtazapine and HCTZ. It is unknown when she started on HCTZ, it does not appear that she was on it in January but we are  unable to obtain any information from the patient. If she did start the HCTZ recently, SIADH is a common side effect that occurs in the first couple of weeks of starting it and more often in elderly women.  She was given 120 cc of 3% hypertonic saline in the ED. -1.8% saline at 42mL/hr -BMP q4 hr -Do not increase >7mEq/24 hours -Osmolality -Urine osmolality, sodium, K, and Cr levels  #Atrial fibrillation: Patient is not on anticogulation due to previous intracranial hemorrhage. She takes metoprolol at home. She had an irregular heart rhythm on exam. EKG showed she was rate controlled. -Hold metoprolol to allow for permissive hypertension -Can restart metoprolol if patient HR >85 or becomes symptomatic -On telemetry  #QT prolongation: Based on EKG -Avoid QT prolonging medications  #Hypertensive urgency: Patient was hypertensive on admission, 170/135. She is on lisinopril, HCTZ, and metoprolol at home.  -Hold antihypertensives to allow for permissive hypertension  #Hypothyroidism: Patient is on synthroid at home.  -Continue home medication  #GERD: Patient is on pantoprazole at home.  -Continue home medications  #Dementia: -Delirium precautions    FEN: S/p 100cc of 3% saline, replete lytes prn, NPO VTE ppx: Lovenox  Code Status: DNR   Dispo: Admit patient to Inpatient with expected length of stay greater than 2 midnights.  Signed: Claudean Severance, MD 09/24/2017, 2:03 AM  Pager: [phone]

## 2017-09-23 NOTE — ED Notes (Signed)
Patient transported to MRI 

## 2017-09-23 NOTE — ED Triage Notes (Signed)
Patient from [facility], arrived via EMS as code stroke. Per facility and ems pt was playing bingo when she fell and hit the back of her head. Unsure if mechanical or syncopal episode. Has a hematoma on back of her head. AOx2, with previous deficits from a past stroke.

## 2017-09-23 NOTE — ED Provider Notes (Signed)
[facility] EMERGENCY DEPARTMENT Provider Note   CSN: [id number] Arrival date & time: 09/23/17  1654   An emergency department physician performed an initial assessment on this suspected stroke patient at 1653(haviland).  History   Chief Complaint Chief Complaint  Patient presents with  . Code Stroke    HPI Ebony [name] is a 84 y.o. female.  HPI   85yF with aphasia. LKN around 3:30 pm when playing bingo. Found on floor out of wheelchair around 4:00 pm. Confused and aphasic. Made code stroke. On my exam she is speak intermittently and intermittently following commands. She has no complaints but she is not a reliabel historian. Prior hx of afib but not anticoagulated because hx of SAH and SDH.   Past Medical History:  Diagnosis Date  . Acute ischemic stroke (HCC) 04/11/2012  . Allergic rhinitis   . Anxiety   . Arthritis    Knees  . Dementia   . Depression   . Fibrocystic breast disease   . Hiatal hernia   . Hyperlipidemia   . Hypertension   . IFG (impaired fasting glucose)   . SDH (subdural hematoma) (HCC) 05/16/2011  . Subarachnoid hemorrhage following injury (HCC) 05/16/2011  . Subarachnoid hemorrhage following injury (HCC) 05/16/2011  . Unspecified hypothyroidism 04/15/2012    Patient Active Problem List   Diagnosis Date Noted  . ICH (intracerebral hemorrhage) (HCC) 02/06/2017  . Urinary tract infection due to Enterococcus   . Intraventricular hemorrhage (HCC)   . History of recent fall   . Vascular dementia, uncomplicated   . Acute cystitis without hematuria   . Intracranial hemorrhage (HCC) 10/20/2016  . Hyponatremia 10/08/2016  . Hypertensive urgency 10/08/2016  . Polypharmacy   . Paroxysmal supraventricular tachycardia (HCC)   . Recurrent falls 09/04/2016  . Maxillary sinus fracture (HCC) 09/04/2016  . Closed fracture of nasal bone 09/04/2016  . Acute encephalopathy   . Expressive aphasia   . History of TIA (transient ischemic attack)  and stroke 06/24/2016  . Depression with anxiety 06/24/2016  . Hypothyroid 06/24/2016  . GERD (gastroesophageal reflux disease) 06/24/2016  . Allergic rhinitis 06/08/2014  . DDD (degenerative disc disease), lumbar 06/08/2014  . Insomnia 12/07/2013  . Depression   . OA (osteoarthritis) 01/06/2013  . Loss of weight 07/13/2012  . Vascular dementia without behavioral disturbance 06/20/2012  . Hypothyroidism due to acquired atrophy of thyroid 04/15/2012  . Essential hypertension 05/16/2011  . Anxiety disorder 05/16/2011    Past Surgical History:  Procedure Laterality Date  . ABDOMINAL HYSTERECTOMY    . BREAST SURGERY    . EYE SURGERY    . TONSILLECTOMY       OB History   None      Home Medications    Prior to Admission medications   Medication Sig Start Date End Date Taking? Authorizing Provider  atorvastatin (LIPITOR) 20 MG tablet Take 1 tablet (20 mg total) by mouth daily at 6 PM. 02/09/17   Beryl Meager, NP  fluticasone (FLONASE) 50 MCG/ACT nasal spray Place 2 sprays into both nostrils daily. Patient not taking: Reported on 03/02/2017 06/08/14   Sharon Seller, NP  levothyroxine (SYNTHROID, LEVOTHROID) 25 MCG tablet Take 1 tablet (25 mcg total) by mouth daily before breakfast. 04/15/12   Christiane Ha, MD  lisinopril (PRINIVIL,ZESTRIL) 40 MG tablet Take 40 mg by mouth daily. 01/25/17   [provider]  magnesium hydroxide (MILK OF MAGNESIA) 400 MG/5ML suspension Take 30 mLs by mouth daily as needed for  mild constipation.    [provider]  Melatonin 3 MG CAPS By mouth qhs for sleep Patient taking differently: Take 3 mg by mouth at bedtime.  11/17/13   Sharon Seller, NP  metoprolol tartrate (LOPRESSOR) 25 MG tablet Take 0.5 tablets (12.5 mg total) by mouth every 6 (six) hours. 02/09/17   Beryl Meager, NP  mirtazapine (REMERON) 15 MG tablet Take 15 mg by mouth at bedtime.    [provider]  Multiple Vitamins-Minerals (THERA-M PO)  Take 1 tablet by mouth every morning.    [provider]  pantoprazole (PROTONIX) 20 MG tablet Take 20 mg by mouth every morning.     [provider]  polyethylene glycol (MIRALAX / GLYCOLAX) packet Take 17 g by mouth daily.    [provider]  senna-docusate (SENOKOT-S) 8.6-50 MG tablet Take 1 tablet by mouth daily as needed for mild constipation.    [provider]    Family History Family History  Problem Relation Age of Onset  . Lung cancer Sister   . CVA Neg Hx   . Diabetes Neg Hx   . Hypertension Neg Hx     Social History Social History   Tobacco Use  . Smoking status: Never Smoker  . Smokeless tobacco: Never Used  Substance Use Topics  . Alcohol use: No  . Drug use: No     Allergies   Sulfa antibiotics; Biaxin [clarithromycin]; Penicillins; Klor-con [potassium chloride]; Ace inhibitors; and Cozaar [losartan potassium]   Review of Systems Review of Systems  Level 5 caveat because of confusion.  Physical Exam Updated Vital Signs Ht 5\' 2"  (1.575 m)   Wt 57.5 kg (126 lb 11.2 oz)   BMI 23.17 kg/m   Physical Exam  Constitutional: She appears well-developed and well-nourished. No distress.  HENT:  Head: Normocephalic and atraumatic.  Eyes: Conjunctivae are normal. Right eye exhibits no discharge. Left eye exhibits no discharge.  Neck: Neck supple.  Cardiovascular: Normal rate, regular rhythm and normal heart sounds. Exam reveals no gallop and no friction rub.  No murmur heard. Pulmonary/Chest: Effort normal and breath sounds normal. No respiratory distress.  Abdominal: Soft. She exhibits no distension. There is no tenderness.  Musculoskeletal: She exhibits no edema or tenderness.  Neurological: She is alert.  Laying in bed with eyes open. Will answer some questions intermittently. Speech is clear when she does. Told me her name but when asked again a couple minutes later just stared to me. Will sometime follow commands like  moving extremities. Needs frequent reinforcement. Hard to get great exam but doesn't seem to have focal deficits. Seems more like encephalopathy.   Skin: Skin is warm and dry.  Nursing note and vitals reviewed.    ED Treatments / Results  Labs (all labs ordered are listed, but only abnormal results are displayed) Labs Reviewed  CBC - Abnormal; Notable for the following components:      Result Value   Hemoglobin 11.6 (*)    HCT 34.3 (*)    All other components within normal limits  I-STAT CHEM 8, ED - Abnormal; Notable for the following components:   Sodium 118 (*)    Potassium 3.3 (*)    Chloride 82 (*)    Glucose, Bld 127 (*)    Calcium, Ion 1.09 (*)    Hemoglobin 11.9 (*)    HCT 35.0 (*)    All other components within normal limits  CBG MONITORING, ED - Abnormal; Notable for the following components:  Glucose-Capillary 130 (*)    All other components within normal limits  DIFFERENTIAL  ETHANOL  PROTIME-INR  APTT  COMPREHENSIVE METABOLIC PANEL  RAPID URINE DRUG SCREEN, HOSP PERFORMED  URINALYSIS, ROUTINE W REFLEX MICROSCOPIC  HEMOGLOBIN A1C  LIPID PANEL  I-STAT TROPONIN, ED    EKG EKG Interpretation  Date/Time:  Thursday September 23 2017 17:17:33 EDT Ventricular Rate:  60 PR Interval:    QRS Duration: 124 QT Interval:  470 QTC Calculation: 470 R Axis:   49 Text Interpretation:  Atrial fibrillation Nonspecific intraventricular conduction delay Probable anteroseptal infarct, recent Confirmed by Ross MarcusHorton, Courtney ([id number]) on 09/25/2017 2:32:32 AM   Radiology Ct Cervical Spine Wo Contrast  Result Date: 09/23/2017 CLINICAL DATA:  Neck pain after fall. EXAM: CT CERVICAL SPINE WITHOUT CONTRAST TECHNIQUE: Multidetector CT imaging of the cervical spine was performed without intravenous contrast. Multiplanar CT image reconstructions were also generated. COMPARISON:  CT of the cervical spine 03/02/2017 FINDINGS: Alignment: Grade 1 anterolisthesis at C4-5 is stable. AP alignment  is otherwise anatomic. Skull base and vertebrae: The craniocervical junction is normal. Vertebral body heights are normal. No acute or healing fractures are present. Soft tissues and spinal canal: Soft tissues the neck are unremarkable. Significant vascular disease is present. There is no significant adenopathy. Salivary glands are within normal limits. Disc levels: There is fusion across the disc space at C3-4, choir. Facet degenerative changes present at C2-3 and C3-4 with mild foraminal narrowing on the left at both levels. Chronic loss of disc height and uncovertebral disease contributes to mild foraminal narrowing bilaterally at C5-6 and C6-7, left greater than right. Upper chest: The lung apices are clear. IMPRESSION: 1. No acute abnormality. 2. Stable grade 1 anterolisthesis at C4-5. 3. Multilevel degenerative changes of the cervical spine are stable. Electronically Signed   By: Marin Robertshristopher  Mattern M.D.   On: 09/23/2017 17:27   Ct Head Code Stroke Wo Contrast  Result Date: 09/23/2017 CLINICAL DATA:  Code stroke. Altered level of consciousness, unexplained. New onset aphasia. EXAM: CT HEAD WITHOUT CONTRAST TECHNIQUE: Contiguous axial images were obtained from the base of the skull through the vertex without intravenous contrast. COMPARISON:  CT head without contrast 03/02/2017 FINDINGS: Brain: Encephalomalacia of the anterior left frontal lobe is consistent with the area previous hemorrhage. Moderate atrophy and white matter disease is otherwise stable. No acute cortical infarct, hemorrhage, or mass lesion is present. Ventricles are proportionate to the degree of atrophy. No significant extra-axial fluid collection is present. Vascular: Atherosclerotic calcifications are present in the cavernous internal carotid arteries bilaterally. There is no hyperdense vessel. Skull: Calvarium is intact. There is a focal area of soft tissue swelling along the posterior scalp without underlying fracture.  Sinuses/Orbits: The paranasal sinuses and mastoid air cells are clear. Bilateral lens replacements are present. Globes and orbits are within normal limits. ASPECTS [facility]([place] Stroke Program Early CT Score) - Ganglionic level infarction (caudate, lentiform nuclei, internal capsule, insula, M1-M3 cortex): 7/7 - Supraganglionic infarction (M4-M6 cortex): 3/3 Total score (0-10 with 10 being normal): 10/10 IMPRESSION: 1. No acute intracranial abnormality. 2. Encephalomalacia of the anterior left frontal lobe is as expected following previous hemorrhage. 3. Otherwise stable moderate atrophy and white matter disease. 4. Soft tissue swelling along the occipital scalp. Question trauma. There is no underlying fracture. 5. ASPECTS is 10/10 The above was relayed via text pager to Dr. Laurence SlateAroor on 09/23/2017 at 17:20 . Electronically Signed   By: Marin Robertshristopher  Mattern M.D.   On: 09/23/2017 17:22    Procedures Procedures (  including critical care time)  CRITICAL CARE Performed by: Raeford Razor Total critical care time: 35 minutes Critical care time was exclusive of separately billable procedures and treating other patients. Critical care was necessary to treat or prevent imminent or life-threatening deterioration. Critical care was time spent personally by me on the following activities: development of treatment plan with patient and/or surrogate as well as nursing, discussions with consultants, evaluation of patient's response to treatment, examination of patient, obtaining history from patient or surrogate, ordering and performing treatments and interventions, ordering and review of laboratory studies, ordering and review of radiographic studies, pulse oximetry and re-evaluation of patient's condition.   Medications Ordered in ED Medications   stroke: mapping our early stages of recovery book (has no administration in time range)  ondansetron (ZOFRAN) injection 4 mg (4 mg Intravenous Given by Other 09/23/17 1700)      Initial Impression / Assessment and Plan / ED Course  I have reviewed the triage vital signs and the nursing notes.  Pertinent labs & imaging results that were available during my care of the patient were reviewed by me and considered in my medical decision making (see chart for details).     85yF with initial concerns of aphasia. She seems more encephalopathic than anything. She is very hyponatremic and this fits more with her exam than CVA does. She is on HCTZ. Most likely culprit. Given he degree of confusion, will give small amount of hypertonic saline. Fluid restrict while in ED otherwise. Additional blood work/labs. Admit.   Final Clinical Impressions(s) / ED Diagnoses   Final diagnoses:  Hyponatremia    ED Discharge Orders    None       Raeford Razor, MD 10/01/17 1006

## 2017-09-23 NOTE — ED Notes (Signed)
Attempted report to floor x1, left call back number with RN.

## 2017-09-23 NOTE — ED Notes (Signed)
Spoke with admitting at bedside about patient's blood pressure, admitting drs stating neuro note allowing permissive htn. No further orders at this time.

## 2017-09-23 NOTE — Code Documentation (Signed)
84[facility] [id number]. Patient from [facility] where she was playing bingo at 1530 and was taken back to her room. She was later checked on and found on the floor. EMS called and reports she had hit the back of her head with hematoma noted. Patient vomited per EMS. Of note, patient with h/o IVH and ICH in 2018 with aphasia. Patient wheelchair bound per EMS. Code stroke called d/t patient not speaking or following commands. Stroke team at the bedside on patient arrival. Labs drawn and patient cleared for CT by EDP. Patient to CT with team. Patient vomited x 1 and given Zofran. CT completed. NIHSS 4, see documentation for details and code stroke times. Patient initially having difficulty answering questions and following commands, however, post-CT answering questions, following commands and naming. Patient is contraindicated for tPA d/t h/o ICH. No acute stroke treatment at this time. Bedside handoff with ED RN Maralyn SagoSarah.

## 2017-09-23 NOTE — ED Notes (Signed)
Pt returned from CT Daughter arrived to room as well and is at bedside with pt at this time.

## 2017-09-23 NOTE — ED Notes (Signed)
Patient back from MRI.

## 2017-09-23 NOTE — ED Notes (Signed)
Holding stroke swallow screen. Patient alert but not following commands requested. Per family-patient has not had a problem with dysphagia before.

## 2017-09-23 NOTE — ED Notes (Signed)
ED Provider at bedside. 

## 2017-09-23 NOTE — Progress Notes (Signed)
Patient arrived around 2230 not alert or oriented none verbal unable to assess. She vomited once BP is high will call attending to get PRN IV for BP.

## 2017-09-23 NOTE — Consult Note (Addendum)
Referring Physician: Raeford Razor, MD    Chief Complaint: Code Stroke   HPI: Ebony [name] is an 84 y.o. female with a past medical history of hypertension, A. fib not on anticoagulation due to previous intracranial hemorrhage, dyslipidemia, hypothyroidism, depression, prior cerebrovascular accident, history of subarachnoid hemorrhage (last on 02/09/2017 - left frontal lobe hematoma 29cc), and subdural hematomavascular dementia and prior records frequent falls, intermittent aphasia and right sided weakness who presents from place with syncope with  aphasia as a CODE STROKE. Per EMS and reports from [facility].  Around 3:30 PM, patient was awake and alert playing bingo with the residents of the facility.  Around 4 PM she was found on the, fell out of her wheelchair, unclear if it is a mechanical or syncopal episode.  Patient was noted to have small hematoma in the back of her head.  Was concerning as the patient was aphasic and not responsive to staff.  Per EMS she was awake and alert but aphasic.  On admission to the hospital patient symptoms were minimally improving and she was more reactive with staff, speaks a few words, with intact naming. In the CT machine patient started vomiting copiously-was reported that patient vomited at her facility.  Patient minimally follows commands with concentration and does move her extremities with increased direction.  Neuro was consulted for further evaluation and patient will be admitted.  Patient with altered mental status and unable to accurately provide review of systems or history of present illness.  Of note MOCA in 2018 score 9 indicating severe cognitive dysfunction at baseline  LSN:09/23/17 3:30pm  tPA Given: No: Patient with previously poor functional status, previous ICH and improving symptoms NIHSS: 4  Baseline Premorbid modified Rankin scale (mRS):4  Past Medical History:  Diagnosis Date  . Acute ischemic stroke (HCC) 04/11/2012  .  Allergic rhinitis   . Anxiety   . Arthritis    Knees  . Dementia   . Depression   . Fibrocystic breast disease   . Hiatal hernia   . Hyperlipidemia   . Hypertension   . IFG (impaired fasting glucose)   . SDH (subdural hematoma) (HCC) 05/16/2011  . Subarachnoid hemorrhage following injury (HCC) 05/16/2011  . Subarachnoid hemorrhage following injury (HCC) 05/16/2011  . Unspecified hypothyroidism 04/15/2012    Past Surgical History:  Procedure Laterality Date  . ABDOMINAL HYSTERECTOMY    . BREAST SURGERY    . EYE SURGERY    . TONSILLECTOMY      Family History  Problem Relation Age of Onset  . Lung cancer Sister   . CVA Neg Hx   . Diabetes Neg Hx   . Hypertension Neg Hx    Social History:  reports that she has never smoked. She has never used smokeless tobacco. She reports that she does not drink alcohol or use drugs.  Allergies:  Allergies  Allergen Reactions  . Sulfa Antibiotics Diarrhea  . Biaxin [Clarithromycin] Diarrhea  . Penicillins Diarrhea    Has patient had a PCN reaction causing immediate rash, facial/tongue/throat swelling, SOB or lightheadedness with hypotension: Unknown Has patient had a PCN reaction causing severe rash involving mucus membranes or skin necrosis: Unknown Has patient had a PCN reaction that required hospitalization: Unknown Has patient had a PCN reaction occurring within the last 10 years: Unknown If all of the above answers are "NO", then may proceed with Cephalosporin use.   Marland Kitchen Klor-Con [Potassium Chloride] Other (See Comments)    Noted to be an allergy on the  MAR  . Ace Inhibitors     Cough with some Ace inhibitors.  Patient tolerates lisinopril and takes everyday.   Lynelle Smoke [Losartan Potassium] Rash    Medications:  .  stroke: mapping our early stages of recovery book   Does not apply Once    ROS: Unable to accurately assess due to patient's aphasia and altered mental status  Physical Examination: Height 5\' 2"  (1.575 m), weight  57.5 kg (126 lb 11.2 oz). HEENT-  Normocephalic, no lesions, without obvious abnormality.  Normal external eye and conjunctiva.   Cardiovascular- S1-S2 audible, pulses palpable throughout   Lungs-no rhonchi or wheezing noted, no excessive working breathing.  Saturations within normal limits Abdomen- All 4 quadrants palpated and nontender Musculoskeletal-no joint tenderness, deformity or swelling Skin-warm and dry, no hyperpigmentation, vitiligo, or suspicious lesions  Neurological Examination Mental Status: Alert, knows the date, month, her birthdate.  Speech without dysarthria but patient noted to be intermittently aphasic.  Patient only able to follow one-step commands with increased prompting Cranial Nerves: II: Visual fields grossly normal- ?  Blink to threat in the left eye,  III,IV, VI: ptosis not present, extra-ocular motions intact bilaterally pupils equal, round, reactive to light  V,VII: smile symmetric, facial light touch sensation normal bilaterally VIII: Hearing intact to voice IX,X: uvula rises symmetrically XI: bilateral shoulder shrug XII: midline tongue extension Motor: Patient noted to have right upper and lower extremity weakness.  Patient with drift in bilateral upper extremities and mild drift on right lower extremity. Right : Upper extremity   3/5    Left:     Upper extremity   5/5  Lower extremity   3/5     Lower extremity   5/5 Tone and bulk:normal tone throughout; no atrophy noted Sensory: Withdraws appropriately to stimuli bilaterally  Deep Tendon Reflexes: 1+ and symmetric throughout Plantars: Right: downgoing   Left: downgoing Cerebellar: Unable to accurately follow commands, unable to accurately assess  gait: Not assessed due to safety   Results for orders placed or performed during the hospital encounter of 09/23/17 (from the past 48 hour(s))  CBG monitoring, ED     Status: Abnormal   Collection Time: 09/23/17  4:55 PM  Result Value Ref Range    Glucose-Capillary 130 (H) 70 - 99 mg/dL  CBC     Status: Abnormal   Collection Time: 09/23/17  4:56 PM  Result Value Ref Range   WBC 10.3 4.0 - 10.5 K/uL   RBC 4.24 3.87 - 5.11 MIL/uL   Hemoglobin 11.6 (L) 12.0 - 15.0 g/dL   HCT 91.4 (L) 78.2 - 95.6 %   MCV 80.9 78.0 - 100.0 fL   MCH 27.4 26.0 - 34.0 pg   MCHC 33.8 30.0 - 36.0 g/dL   RDW 21.3 08.6 - 57.8 %   Platelets 253 150 - 400 K/uL    Comment: Performed at [facility], [address], [place], [place] [address]  Differential     Status: None   Collection Time: 09/23/17  4:56 PM  Result Value Ref Range   Neutrophils Relative % 64 %   Neutro Abs 6.7 1.7 - 7.7 K/uL   Lymphocytes Relative 26 %   Lymphs Abs 2.7 0.7 - 4.0 K/uL   Monocytes Relative 8 %   Monocytes Absolute 0.8 0.1 - 1.0 K/uL   Eosinophils Relative 1 %   Eosinophils Absolute 0.1 0.0 - 0.7 K/uL   Basophils Relative 0 %   Basophils Absolute 0.0 0.0 -  0.1 K/uL   Immature Granulocytes 1 %   Abs Immature Granulocytes 0.1 0.0 - 0.1 K/uL    Comment: Performed at [facility], [address] [place], [place] [id number]  I-stat troponin, ED     Status: None   Collection Time: 09/23/17  5:00 PM  Result Value Ref Range   Troponin i, poc 0.01 0.00 - 0.08 ng/mL   Comment 3            Comment: Due to the release kinetics of cTnI, a negative result within the first hours of the onset of symptoms does not rule out myocardial infarction with certainty. If myocardial infarction is still suspected, repeat the test at appropriate intervals.   I-Stat Chem 8, ED     Status: Abnormal   Collection Time: 09/23/17  5:02 PM  Result Value Ref Range   Sodium 118 (LL) 135 - 145 mmol/L   Potassium 3.3 (L) 3.5 - 5.1 mmol/L   Chloride 82 (L) 98 - 111 mmol/L   BUN 15 8 - 23 mg/dL   Creatinine, Ser 3.470.90 0.44 - 1.00 mg/dL   Glucose, Bld 425127 (H) 70 - 99 mg/dL   Calcium, Ion 9.561.09 (L) 1.15 - 1.40 mmol/L   TCO2 25 22 - 32 mmol/L   Hemoglobin 11.9 (L) 12.0 - 15.0 g/dL   HCT  38.735.0 (L) 56.436.0 - 46.0 %   Comment NOTIFIED PHYSICIAN    Ct Cervical Spine Wo Contrast 09/23/2017 IMPRESSION:  1. No acute abnormality.  2. Stable grade 1 anterolisthesis at C4-5.  3. Multilevel degenerative changes of the cervical spine are stable.   Ct Head Code Stroke Wo Contrast  09/23/2017 IMPRESSION:  1. No acute intracranial abnormality.  2. Encephalomalacia of the anterior left frontal lobe is as expected following previous hemorrhage.  3. Otherwise stable moderate atrophy and white matter disease.  4. Soft tissue swelling along the occipital scalp. Question trauma. There is no underlying fracture.  5. ASPECTS is 10/10    Assessment: 84 y.o. female with a past medical history of hypertension, A. fib not on anticoagulation due to previous intracranial hemorrhage, dyslipidemia, hypothyroidism, depression, prior cerebrovascular accident, history of subarachnoid hemorrhage (last on 02/09/2017 - left frontal lobe hematoma 29cc), and subdural hematomavascular dementia and prior records frequent falls, intermittent aphasia and right sided weakness who presents from place with syncope with  aphasia as a CODE STROKE.  1. Rule out stroke versus syncope versus seizure in  patient with extensive history of CVA, ICH, SDH, vascular dementia.  Patient with baseline deficits of expressive aphasia, wheelchair bound and cognitive impairment.  Initial Noncon CT of the head did not show any acute abnormalities or hemorrhage.  MRI in a.m. for further stroke work-up.  EEG to eval for seizure in patient with encephalomalacia secondary to previous ICH.  Patient's symptoms have improved since being in the hospital.  We will continue close neuro monitoring with frequent neuro checks.  Patient with multifactorial stroke risk factors of A. fib, dyslipidemia, hypertension  2. Symptomatic hyponatremia   3.  Hypertensive emergency-with SBP greater than 220 in the ED.  Continue permissive hypertension for the next 24  to 48 hours, but with patient with history of ICH and subdural hematoma, will we will treat blood pressure greater than180   3. Encephalopathy- likely 2. 2 hyponatremia, possible seizure  4.  Chronic A. Fib -not on anticoagulation due to previous intracranial hemorrhage 5.  Dyslipidemia- continue atorvastatin for secondary stroke prevention 6.  Hypothyroidism  Neuro stroke Risk Factors - atrial fibrillation, hyperlipidemia and hypertension  Plan - Correct electrolytes - MRI of the brain without contrast if patient does not return to normal to rule out stroke  - EEG - Allow for permissive hypertension for the first 24-48h - only treat PRN if SBP >180 mmHg. Blood pressures can be gradually normalized to SBP<140 upon discharge - Risk factor modification - Telemetry monitoring -  Frequent neuro checks - PT consult, OT consult, Speech consult   Noralee Space DNP Neuro-hospitalist Team [phone] 09/23/2017, 5:38 PM  NEUROHOSPITALIST ADDENDUM Seen and examined the patient today. I have reviewed the contents of history and physical exam as documented by PA/ARNP/Resident and agree with above documentation.  I have discussed and formulated the above plan as documented. Edits to the note have been made as needed.    Georgiana Spinner Aroor MD [facility] [phone]   If 7pm to 7am, please call on call as listed on AMION.

## 2017-09-24 ENCOUNTER — Inpatient Hospital Stay (HOSPITAL_COMMUNITY): Payer: Medicare HMO

## 2017-09-24 ENCOUNTER — Encounter (HOSPITAL_COMMUNITY): Payer: Self-pay | Admitting: General Practice

## 2017-09-24 ENCOUNTER — Other Ambulatory Visit: Payer: Self-pay

## 2017-09-24 DIAGNOSIS — Z880 Allergy status to penicillin: Secondary | ICD-10-CM

## 2017-09-24 DIAGNOSIS — Z79890 Hormone replacement therapy (postmenopausal): Secondary | ICD-10-CM

## 2017-09-24 DIAGNOSIS — Z993 Dependence on wheelchair: Secondary | ICD-10-CM

## 2017-09-24 DIAGNOSIS — E039 Hypothyroidism, unspecified: Secondary | ICD-10-CM

## 2017-09-24 DIAGNOSIS — Z87820 Personal history of traumatic brain injury: Secondary | ICD-10-CM

## 2017-09-24 DIAGNOSIS — K219 Gastro-esophageal reflux disease without esophagitis: Secondary | ICD-10-CM

## 2017-09-24 DIAGNOSIS — W19XXXA Unspecified fall, initial encounter: Secondary | ICD-10-CM

## 2017-09-24 DIAGNOSIS — E785 Hyperlipidemia, unspecified: Secondary | ICD-10-CM

## 2017-09-24 DIAGNOSIS — R7989 Other specified abnormal findings of blood chemistry: Secondary | ICD-10-CM

## 2017-09-24 DIAGNOSIS — I69351 Hemiplegia and hemiparesis following cerebral infarction affecting right dominant side: Secondary | ICD-10-CM

## 2017-09-24 DIAGNOSIS — R778 Other specified abnormalities of plasma proteins: Secondary | ICD-10-CM

## 2017-09-24 DIAGNOSIS — R011 Cardiac murmur, unspecified: Secondary | ICD-10-CM

## 2017-09-24 DIAGNOSIS — Z882 Allergy status to sulfonamides status: Secondary | ICD-10-CM

## 2017-09-24 DIAGNOSIS — R296 Repeated falls: Secondary | ICD-10-CM

## 2017-09-24 DIAGNOSIS — Z888 Allergy status to other drugs, medicaments and biological substances status: Secondary | ICD-10-CM

## 2017-09-24 DIAGNOSIS — Z79899 Other long term (current) drug therapy: Secondary | ICD-10-CM

## 2017-09-24 DIAGNOSIS — Z9181 History of falling: Secondary | ICD-10-CM

## 2017-09-24 DIAGNOSIS — R4701 Aphasia: Secondary | ICD-10-CM

## 2017-09-24 DIAGNOSIS — Z66 Do not resuscitate: Secondary | ICD-10-CM

## 2017-09-24 DIAGNOSIS — I4581 Long QT syndrome: Secondary | ICD-10-CM

## 2017-09-24 DIAGNOSIS — R9431 Abnormal electrocardiogram [ECG] [EKG]: Secondary | ICD-10-CM

## 2017-09-24 DIAGNOSIS — I480 Paroxysmal atrial fibrillation: Secondary | ICD-10-CM

## 2017-09-24 DIAGNOSIS — I10 Essential (primary) hypertension: Secondary | ICD-10-CM

## 2017-09-24 LAB — BASIC METABOLIC PANEL
ANION GAP: 11 (ref 5–15)
ANION GAP: 12 (ref 5–15)
ANION GAP: 13 (ref 5–15)
ANION GAP: 4 — AB (ref 5–15)
ANION GAP: 6 (ref 5–15)
ANION GAP: 15 (ref 5–15)
Anion gap: 14 (ref 5–15)
Anion gap: 13 (ref 5–15)
BUN: 10 mg/dL (ref 8–23)
BUN: 11 mg/dL (ref 8–23)
BUN: 11 mg/dL (ref 8–23)
BUN: 12 mg/dL (ref 8–23)
BUN: 13 mg/dL (ref 8–23)
BUN: 13 mg/dL (ref 8–23)
BUN: 8 mg/dL (ref 8–23)
BUN: 8 mg/dL (ref 8–23)
CALCIUM: 8.8 mg/dL — AB (ref 8.9–10.3)
CHLORIDE: 82 mmol/L — AB (ref 98–111)
CO2: 19 mmol/L — ABNORMAL LOW (ref 22–32)
CO2: 20 mmol/L — AB (ref 22–32)
CO2: 25 mmol/L (ref 22–32)
CO2: 25 mmol/L (ref 22–32)
CO2: 26 mmol/L (ref 22–32)
CO2: 26 mmol/L (ref 22–32)
CO2: 27 mmol/L (ref 22–32)
CO2: 27 mmol/L (ref 22–32)
CREATININE: 0.93 mg/dL (ref 0.44–1.00)
CREATININE: 1.02 mg/dL — AB (ref 0.44–1.00)
CREATININE: 0.53 mg/dL (ref 0.44–1.00)
CREATININE: 0.94 mg/dL (ref 0.44–1.00)
CREATININE: 0.87 mg/dL (ref 0.44–1.00)
CREATININE: 0.88 mg/dL (ref 0.44–1.00)
Calcium: 5.2 mg/dL — CL (ref 8.9–10.3)
Calcium: 5.5 mg/dL — CL (ref 8.9–10.3)
Calcium: 9 mg/dL (ref 8.9–10.3)
Calcium: 9 mg/dL (ref 8.9–10.3)
Calcium: 9.1 mg/dL (ref 8.9–10.3)
Calcium: 9.1 mg/dL (ref 8.9–10.3)
Calcium: 9.4 mg/dL (ref 8.9–10.3)
Chloride: 108 mmol/L (ref 98–111)
Chloride: 85 mmol/L — ABNORMAL LOW (ref 98–111)
Chloride: 87 mmol/L — ABNORMAL LOW (ref 98–111)
Chloride: 111 mmol/L (ref 98–111)
Chloride: 85 mmol/L — ABNORMAL LOW (ref 98–111)
Chloride: 90 mmol/L — ABNORMAL LOW (ref 98–111)
Chloride: 89 mmol/L — ABNORMAL LOW (ref 98–111)
Creatinine, Ser: 0.56 mg/dL (ref 0.44–1.00)
Creatinine, Ser: 0.94 mg/dL (ref 0.44–1.00)
GFR calc Af Amer: >60 mL/min (ref >60)
GFR calc Af Amer: >60 mL/min (ref >60)
GFR calc Af Amer: >60 mL/min (ref >60)
GFR calc Af Amer: >60 mL/min (ref >60)
GFR calc non Af Amer: 49 mL/min — ABNORMAL LOW (ref >60)
GFR calc non Af Amer: >60 mL/min (ref >60)
GFR calc non Af Amer: 55 mL/min — ABNORMAL LOW (ref >60)
GFR calc non Af Amer: 60 mL/min — ABNORMAL LOW (ref >60)
GFR calc non Af Amer: 59 mL/min — ABNORMAL LOW (ref >60)
GFR, EST AFRICAN AMERICAN: 57 mL/min — AB (ref >60)
GFR, EST NON AFRICAN AMERICAN: 55 mL/min — AB (ref >60)
GFR, EST NON AFRICAN AMERICAN: 55 mL/min — AB (ref >60)
GLUCOSE: 143 mg/dL — AB (ref 70–99)
Glucose, Bld: 92 mg/dL (ref 70–99)
Glucose, Bld: 120 mg/dL — ABNORMAL HIGH (ref 70–99)
Glucose, Bld: 115 mg/dL — ABNORMAL HIGH (ref 70–99)
Glucose, Bld: 73 mg/dL (ref 70–99)
Glucose, Bld: 102 mg/dL — ABNORMAL HIGH (ref 70–99)
Glucose, Bld: 113 mg/dL — ABNORMAL HIGH (ref 70–99)
Glucose, Bld: 105 mg/dL — ABNORMAL HIGH (ref 70–99)
POTASSIUM: 2 mmol/L — AB (ref 3.5–5.1)
POTASSIUM: 3.5 mmol/L (ref 3.5–5.1)
Potassium: 2.1 mmol/L — CL (ref 3.5–5.1)
Potassium: 3.9 mmol/L (ref 3.5–5.1)
Potassium: 3.7 mmol/L (ref 3.5–5.1)
Potassium: 3.6 mmol/L (ref 3.5–5.1)
Potassium: 3.7 mmol/L (ref 3.5–5.1)
Potassium: 3.6 mmol/L (ref 3.5–5.1)
SODIUM: 124 mmol/L — AB (ref 135–145)
SODIUM: 134 mmol/L — AB (ref 135–145)
SODIUM: 125 mmol/L — AB (ref 135–145)
SODIUM: 127 mmol/L — AB (ref 135–145)
Sodium: 123 mmol/L — ABNORMAL LOW (ref 135–145)
Sodium: 125 mmol/L — ABNORMAL LOW (ref 135–145)
Sodium: 128 mmol/L — ABNORMAL LOW (ref 135–145)
Sodium: 134 mmol/L — ABNORMAL LOW (ref 135–145)

## 2017-09-24 LAB — LIPID PANEL
Cholesterol: 133 mg/dL (ref 0–200)
HDL: 67 mg/dL (ref >40)
LDL Cholesterol: 56 mg/dL (ref 0–99)
Total CHOL/HDL Ratio: 2 RATIO
Triglycerides: 52 mg/dL (ref <150)
VLDL: 10 mg/dL (ref 0–40)

## 2017-09-24 LAB — HEMOGLOBIN A1C
Hgb A1c MFr Bld: 5.5 % (ref 4.8–5.6)
Mean Plasma Glucose: 111.15 mg/dL

## 2017-09-24 LAB — MRSA PCR SCREENING: MRSA by PCR: NEGATIVE

## 2017-09-24 LAB — TROPONIN I
TROPONIN I: 0.07 ng/mL — AB (ref <0.03)
Troponin I: 0.06 ng/mL (ref <0.03)

## 2017-09-24 MED ORDER — HYDRALAZINE HCL 20 MG/ML IJ SOLN
2.0000 mg | INTRAMUSCULAR | Status: DC | PRN
  Administered 2017-09-24: 2 mg via INTRAVENOUS
  Filled 2017-09-24: qty 1

## 2017-09-24 MED ORDER — STERILE WATER FOR INJECTION IV SOLN
INTRAVENOUS | Status: DC
  Administered 2017-09-24: 05:00:00 via INTRAVENOUS
  Filled 2017-09-24: qty 923

## 2017-09-24 MED ORDER — SODIUM CHLORIDE 0.9 % IV SOLN
INTRAVENOUS | Status: DC
  Administered 2017-09-24: 10:00:00 via INTRAVENOUS

## 2017-09-24 MED ORDER — SODIUM CHLORIDE 3 % IV SOLN
Freq: Once | INTRAVENOUS | Status: DC
Start: 2017-09-24 — End: 2017-09-24

## 2017-09-24 MED ORDER — SODIUM CHLORIDE 0.9 % IV SOLN
INTRAVENOUS | Status: DC
  Administered 2017-09-24: 1000 mL via INTRAVENOUS

## 2017-09-24 MED ORDER — LEVOTHYROXINE SODIUM 100 MCG IV SOLR
12.5000 ug | Freq: Every day | INTRAVENOUS | Status: DC
  Administered 2017-09-24 – 2017-09-25 (×2): 12.5 ug via INTRAVENOUS
  Filled 2017-09-24 (×3): qty 5

## 2017-09-24 MED ORDER — LABETALOL HCL 5 MG/ML IV SOLN
5.0000 mg | Freq: Four times a day (QID) | INTRAVENOUS | Status: DC | PRN
  Administered 2017-09-24 – 2017-09-25 (×2): 5 mg via INTRAVENOUS
  Filled 2017-09-24 (×2): qty 4

## 2017-09-24 MED ORDER — STERILE WATER FOR INJECTION IJ SOLN
INTRAVENOUS | Status: DC
  Filled 2017-09-24: qty 923

## 2017-09-24 NOTE — Progress Notes (Signed)
New CT shows some new hemorrhagic issues and a none displaced skull FX, attending was contacted by radiologist with these results.

## 2017-09-24 NOTE — Evaluation (Signed)
Speech Language Pathology Evaluation Patient Details Name: Ebony [name] MRN: [id number] DOB: 06/15/1931 Today's Date: 09/24/2017 Time: 8295-6213 SLP Time Calculation (min) (ACUTE ONLY): 8 min  Problem List:  Patient Active Problem List   Diagnosis Date Noted  . Fall 09/24/2017  . Paroxysmal A-fib (HCC) 09/24/2017  . Hypothyroidism 09/24/2017  . Elevated troponin 09/24/2017  . QT prolongation 09/24/2017  . Syncope and collapse 09/23/2017  . ICH (intracerebral hemorrhage) (HCC) 02/06/2017  . Urinary tract infection due to Enterococcus   . Intraventricular hemorrhage (HCC)   . History of recent fall   . Vascular dementia, uncomplicated   . Acute cystitis without hematuria   . Intracranial hemorrhage (HCC) 10/20/2016  . Hyponatremia 10/08/2016  . Hypertensive urgency 10/08/2016  . Polypharmacy   . Paroxysmal supraventricular tachycardia (HCC)   . Recurrent falls 09/04/2016  . Maxillary sinus fracture (HCC) 09/04/2016  . Closed fracture of nasal bone 09/04/2016  . Acute encephalopathy   . Expressive aphasia   . History of TIA (transient ischemic attack) and stroke 06/24/2016  . Depression with anxiety 06/24/2016  . Hypothyroid 06/24/2016  . GERD (gastroesophageal reflux disease) 06/24/2016  . Allergic rhinitis 06/08/2014  . DDD (degenerative disc disease), lumbar 06/08/2014  . Insomnia 12/07/2013  . Depression   . OA (osteoarthritis) 01/06/2013  . Loss of weight 07/13/2012  . Vascular dementia without behavioral disturbance 06/20/2012  . Hypothyroidism due to acquired atrophy of thyroid 04/15/2012  . Hypokalemia 04/10/2012  . Essential hypertension 05/16/2011  . Anxiety disorder 05/16/2011   Past Medical History:  Past Medical History:  Diagnosis Date  . Acute ischemic stroke (HCC) 04/11/2012  . Allergic rhinitis   . Anxiety   . Arthritis    Knees  . Dementia   . Depression   . Fibrocystic breast disease   . Hiatal hernia   . Hyperlipidemia   . Hypertension    . IFG (impaired fasting glucose)   . SDH (subdural hematoma) (HCC) 05/16/2011  . Subarachnoid hemorrhage following injury (HCC) 05/16/2011  . Subarachnoid hemorrhage following injury (HCC) 05/16/2011  . Unspecified hypothyroidism 04/15/2012   Past Surgical History:  Past Surgical History:  Procedure Laterality Date  . ABDOMINAL HYSTERECTOMY    . BREAST SURGERY    . EYE SURGERY    . TONSILLECTOMY     HPI:  Pt is an 84 year old female with a PMH of hypertension, atrial fibrillation (not on anticoagulation), HLD, hypothyroidism, GERD, CVA, subarachnoid hemorrhage (01/2017 of left frontal lobe), and dementia (MOCA 2018 with score 9), presenting for a code stroke after an unwitnessed fall, after which she was aphasic and unresponsive to staff. MRI negative for acute infarct. Per RN note in the ED, family denies a h/o dysphagia. Prior BSEs (three in 2018, one in 2014) all recommended regular or Dys 3 diet, thin liquids. Prior SLEs describe expressive aphasia at baseline although still communicative.   Assessment / Plan / Recommendation Clinical Impression  Pt is lethargic this afternoon, not responding to any yes/no questions and not following any commands despite Max multimodal cueing and stimulation. It is difficult to say how this compares to her baseline, but in previous SLP evaluations, family has described her as conversational despite baseline expressive aphasia. There has been concern in the past for primary progressive aphasia, although not sure if there has been any further w/u related to this. SLP will continue to follow acutely to maximize functional communication as medical w/u continues.    SLP Assessment  SLP Recommendation/Assessment: Patient needs  continued Speech Lanaguage Pathology Services SLP Visit Diagnosis: Aphasia (R47.01)    Follow Up Recommendations  Skilled Nursing facility    Frequency and Duration min 2x/week  2 weeks      SLP Evaluation Cognition  Overall  Cognitive Status: No family/caregiver present to determine baseline cognitive functioning Arousal/Alertness: Lethargic Attention: Focused Focused Attention: Impaired Focused Attention Impairment: Functional basic;Verbal basic       Comprehension  Auditory Comprehension Overall Auditory Comprehension: Impaired Yes/No Questions: Impaired(not responding to any questioning) Commands: Impaired One Step Basic Commands: 0-24% accurate    Expression Verbal Expression Overall Verbal Expression: Other (comment)(unsure if this is impaired compared to baseline) Initiation: Impaired Automatic Speech: (none) Level of Generative/Spontaneous Verbalization: (none) Non-Verbal Means of Communication: Not applicable Written Expression Dominant Hand: Right   Oral / Motor  Oral Motor/Sensory Function Overall Oral Motor/Sensory Function: (UTA) Motor Speech Overall Motor Speech: (UTA)   GO                    Maxcine Hamaiewonsky, Attie Nawabi 09/24/2017, 1:40 PM  Maxcine HamLaura Paiewonsky, M.A. CCC-SLP [phone]

## 2017-09-24 NOTE — Progress Notes (Signed)
Subjective: Hospital day 1  Today, Ms. Vanputten was examined at bedside and remained aphasic only blinking her eyes intermittently.   Objective:  Vital signs in last 24 hours: Vitals:   09/24/17 0720 09/24/17 0721 09/24/17 0920 09/24/17 1232  BP: 116/80  (!) 132/97   Pulse:    73  Resp: 18  18   Temp:  99.1 F (37.3 C)  98.5 F (36.9 C)  TempSrc:  Axillary  Oral  SpO2: 98%  98%   Weight:      Height:       Physical exams:  Constitutional: Not alert, aphasic, withdraws extremities to sternal rub  CV: Irregular irregular rhythm, 2/6 systolic murmur at left second intercostal  Resp: Anteriorly CTABL, no wheezes, crackles, rhonci  Neuro: Unable to follow commands, withdraws B/L Upper and lower extremities to sternal rub, moves feet when rubbed at the sole of foot. Negative babinski   Assessment/Plan:  Principal Problem:   Hyponatremia Active Problems:   Hypokalemia   Hypertensive urgency   Fall   Paroxysmal A-fib [facility])   Hypothyroidism   Elevated troponin   QT prolongation   Ms. Xiong is an 84 year old Caucasian female with past medical history significant for hypertension, atrial fibrillation currently not on anticoagulation, previous CVA, previous subarachnoid hemorrhage (on 02/09/2017), hyperlipidemia, hypothyroidism, depression, left temporal lobe hematoma, dementia, frequent falls, intermittent aphasia and right-sided weakness who presented from [facility] after an unwitnessed fall and found to hypotonic hyponatremia on arrival.   Fall  On initial presentation to the ED, she was aphasic and unable to follow commands however prior to admission she was active skilled nursing facility.  We had a working differential of an embolic stroke vs seizure vs syncope.  CT head was negative for acute intracranial abnormality, showing only encephalomalacia of the anterior left frontal lobe.  MRI brain also showed no acute intracranial abnormality.  Follow-up EEG showed  normal drowsy and asleep pattern with no epileptiform activity.  On assessment today, she remains aphasic, does not respond to commands but withdraws to painful stimuli. -Appreciate neurology reccs -Telemetry  -q4 neuro checks -NPO -Aspirin 81mg  -Lipitor 20mg   Hypotonic hyponatremia, symptomatic  Most likely secondary to SIADH given patient's recent HCTZ use.  In addition she was found to have a low serum osmolality of 252 with urine osmolality of 287 indicating urine concentration. On admission, was found to have sNa of 122 and received 120 cc of 3% hypertonic saline.  Her lab values has been inaccurate due to blood drawn from the site with saline infusion.   Current sNa of 125 indicating a 3 point increase in 24 hours.  -Sodium correction goal of 130 (not more than in 24 hours) -Currently on normal NS at 166mL/hr -Continue to monitor   Atrial fibrillation  Given her history of previous SAH in 02/09/2017, she is currently not on anticoagulation. Initial EKG showed showed atrial fibrillation with rate control. HR of 60. Will continue to hold anticoagulation.  Dementia  Remains drowsy and unable to follow commands. Will remain NPO per speech pathology since patient had minimal response. On evaluation by PT and OP, it was recommended for SNF placement. She is a resident at [facility].   HTN  BP currently stable. Per neuro allow for permissive HTN. Will treat if BP >180  Hypothyroidism  Continue synthroid daily   GERD  Continue Protonix 20mg     FEN: NS at 148mL/hr, replete lytes prn, NPO VTE ppx: Lovenox  Code Status:  DNR     Yvette RackAgyei, Obed K, MD 09/24/2017, 2:30 PM Pager: [phone]

## 2017-09-24 NOTE — Progress Notes (Signed)
Patient went for a follow up CT and is back in the room. Radiology called me to obtain information to report CT results.

## 2017-09-24 NOTE — Progress Notes (Addendum)
Critical lab values K =2.1, Ca =5.5 will notify attending.   Also attending wrote care order to stop NS 1.8%.

## 2017-09-24 NOTE — Progress Notes (Signed)
Occupational Therapy Evaluation Patient Details Name: Ebony [name] MRN: [id number] DOB: 1931/04/30 Today's Date: 09/24/2017    History of Present Illness Patient is an 84 y/o female presenting with AMS, non-verbal, after an unwitnessed fall from her w/c. CT scan showed no acute intracranial abnormality, encephalomalacia in anterior left frontal lobe, soft tissue swelling in occipital lobe suspect trauma, no fracture. MRI brain with no acute intracranial abnormality.PMH significant for hypertension, paroxysmal atrial fibrillation (not on anticoagulation), HLD, hypothyroidism, CVA, GERD, subarachnoid hemorrhage (01/2017 of left frontal lobe), and dementia.   Clinical Impression   PT admitted with AMS. Pt currently with functional limitiations due to the deficits listed below (see OT problem list). Pt currently total (A) for all adls. Pt in a flat no response state at this time. Pt does not response to tactile or auditory cues. Pt with eyes opening with position change but does not sustain.  Pt will benefit from skilled OT to increase their independence and safety with adls and balance to allow discharge SNF.     Follow Up Recommendations  SNF   Equipment Recommendations  3 in 1 bedside commode;Wheelchair (measurements OT);Wheelchair cushion (measurements OT);Hospital bed    Recommendations for Other Services       Precautions / Restrictions Precautions Precautions: Fall      Mobility Bed Mobility Overal bed mobility: Needs Assistance Bed Mobility: Supine to Sit;Sit to Supine     Supine to sit: Total assist Sit to supine: Total assist   General bed mobility comments: pt without any response or righting reaction to movement  Transfers                 General transfer comment: na/ not attempted due to w/c bound per chart    Balance                                           ADL either performed or assessed with clinical judgement   ADL Overall  ADL's : Needs assistance/impaired Eating/Feeding: Total assistance   Grooming: Total assistance   Upper Body Bathing: Total assistance   Lower Body Bathing: Total assistance   Upper Body Dressing : Total assistance   Lower Body Dressing: Total assistance   Toilet Transfer: Total assistance             General ADL Comments: pt not arousing to tech wiping face with towel. pt laying with arms crossed and no response to tactile input. pt supine to sit EOB with eyes opening. pt with no verbalizations or sounds. pt noted to be participating in bingo prior to admission. pt with no AROM of either UE. pt no response to name call , not following commands. and not spontaneous movement. pt with flat affect and just blank stare.      Vision         Perception     Praxis      Pertinent Vitals/Pain Pain Assessment: No/denies pain Faces Pain Scale: No hurt     Hand Dominance Right   Extremity/Trunk Assessment Upper Extremity Assessment Upper Extremity Assessment: Generalized weakness;Difficult to assess due to impaired cognition   Lower Extremity Assessment Lower Extremity Assessment: Defer to PT evaluation(noted to have clonus L LE with ankle flexion)   Cervical / Trunk Assessment Cervical / Trunk Assessment: Kyphotic   Communication Communication Communication: Expressive difficulties;Receptive difficulties   Cognition Arousal/Alertness: Lethargic Behavior During Therapy:  Flat affect Overall Cognitive Status: No family/caregiver present to determine baseline cognitive functioning                                 General Comments: pt with no responses to therapist. pt with flat blank stare or closing eyes needing arousal cues   General Comments  at risk for skin break down due to no active movement noted from patient this session    Exercises     Shoulder Instructions      Home Living Family/patient expects to be discharged to:: Skilled nursing  facility     Type of Home: Skilled Nursing Facility                           Additional Comments: from [facility]. chart states w/c bound       Prior Functioning/Environment                   OT Problem List: Decreased strength;Decreased range of motion;Decreased activity tolerance;Impaired balance (sitting and/or standing);Impaired vision/perception;Decreased coordination;Decreased cognition;Decreased safety awareness;Decreased knowledge of use of DME or AE;Decreased knowledge of precautions;Cardiopulmonary status limiting activity;Impaired UE functional use;Pain      OT Treatment/Interventions: Self-care/ADL training;Therapeutic exercise;Neuromuscular education;Energy conservation;DME and/or AE instruction;Manual therapy;Therapeutic activities;Cognitive remediation/compensation;Visual/perceptual remediation/compensation;Patient/family education;Balance training    OT Goals(Current goals can be found in the care plan section) Acute Rehab OT Goals Patient Stated Goal: none stated OT Goal Formulation: Patient unable to participate in goal setting Time For Goal Achievement: 10/08/17 Potential to Achieve Goals: Fair  OT Frequency: Min 2X/week   Barriers to D/C:            Co-evaluation              AM-PAC PT "6 Clicks" Daily Activity     Outcome Measure Help from another person eating meals?: Total Help from another person taking care of personal grooming?: Total Help from another person toileting, which includes using toliet, bedpan, or urinal?: Total Help from another person bathing (including washing, rinsing, drying)?: Total Help from another person to put on and taking off regular upper body clothing?: Total Help from another person to put on and taking off regular lower body clothing?: Total 6 Click Score: 6   End of Session Nurse Communication: Mobility status;Need for lift equipment;Precautions  Activity Tolerance: Patient tolerated treatment  well Patient left: in bed;with call bell/phone within reach;with bed alarm set  OT Visit Diagnosis: Unsteadiness on feet (R26.81)                Time: [id number] OT Time Calculation (min): 15 min Charges:  OT General Charges $OT Visit: 1 Visit OT Evaluation $OT Eval Moderate Complexity: 1 Mod   Mateo FlowJones, Brynn   OTR/L Pager: [phone] Office: [phone] .   Boone MasterJones, Ayrabella Labombard B 09/24/2017, 1:40 PM

## 2017-09-24 NOTE — Progress Notes (Signed)
Q2 neuro checks and vital signs are complete at 0500, obtaining nasal swab for MRSA test.

## 2017-09-24 NOTE — Progress Notes (Signed)
[facility] T[facility].06 critical lab value will notify attending.

## 2017-09-24 NOTE — Progress Notes (Signed)
Critical labs called, K+ 2.0 and Calcium 5.2. MD notified

## 2017-09-24 NOTE — Progress Notes (Signed)
3W 38 have sent 3 AMION pages to IM just received a call back am awaiting new orders.

## 2017-09-24 NOTE — Evaluation (Signed)
Physical Therapy Evaluation Patient Details Name: Ebony [name] MRN: [id number] DOB: 1931-08-08 Today's Date: 09/24/2017   History of Present Illness  Patient is an 84 y/o female presenting with AMS, non-verbal, after an unwitnessed fall from her w/c. CT scan showed no acute intracranial abnormality, encephalomalacia in anterior left frontal lobe, soft tissue swelling in occipital lobe suspect trauma, no fracture. MRI brain with no acute intracranial abnormality.PMH significant for hypertension, paroxysmal atrial fibrillation (not on anticoagulation), HLD, hypothyroidism, CVA, GERD, subarachnoid hemorrhage (01/2017 of left frontal lobe), and dementia.    Clinical Impression  Patient admitted with the above listed diagnosis. [facility] prior to admission, and per chart was essentially w/c bound. Patient today limited with bed mobility, balance, coordination, strength, functional mobility requiring Total A+2 during session. Patient non-verbal throughout session but does open eyes to name after multiple attempts. Does not physically assist with mobility, but able to maintain L elbow prop for ~30 sec at EOB. PT to continue to follow acutely to maximize mobility prior to d/c. Will recommend SNF at d/c.     Follow Up Recommendations SNF;Supervision/Assistance - 24 hour    Equipment Recommendations  (TBD)    Recommendations for Other Services OT consult     Precautions / Restrictions Precautions Precautions: Fall Restrictions Weight Bearing Restrictions: No      Mobility  Bed Mobility Overal bed mobility: Needs Assistance Bed Mobility: Supine to Sit;Sit to Supine     Supine to sit: Total assist;+2 for physical assistance Sit to supine: Total assist;+2 for physical assistance   General bed mobility comments: use of bed pad for mobility; patient without any effort given to initiate or assist with mobility; at EOB will maintain L elbow prop and push up into  sitting, but unable to maintain upright sitting without physical assist  Transfers                 General transfer comment: not attempted; w/c bound per chart  Ambulation/Gait                Stairs            Wheelchair Mobility    Modified Rankin (Stroke Patients Only)       Balance Overall balance assessment: Needs assistance Sitting-balance support: No upper extremity supported;Feet supported Sitting balance-Leahy Scale: Zero   Postural control: Right lateral lean;Posterior lean                                   Pertinent Vitals/Pain Pain Assessment: No/denies pain Faces Pain Scale: No hurt    Home Living Family/patient expects to be discharged to:: Skilled nursing facility     Type of Home: Skilled Nursing Facility           Additional Comments: [facility]. chart states w/c bound     Prior Function Level of Independence: Needs assistance   Gait / Transfers Assistance Needed: w/c bound     Comments: no caregiver present to determine extent of baseline mobility/assistance     Hand Dominance   Dominant Hand: Right    Extremity/Trunk Assessment   Upper Extremity Assessment Upper Extremity Assessment: Defer to OT evaluation    Lower Extremity Assessment Lower Extremity Assessment: Generalized weakness    Cervical / Trunk Assessment Cervical / Trunk Assessment: Kyphotic  Communication   Communication: Expressive difficulties;Receptive difficulties  Cognition Arousal/Alertness: Lethargic Behavior During Therapy: Flat affect  Overall Cognitive Status: No family/caregiver present to determine baseline cognitive functioning                                 General Comments: patient non-verbal throughout session, does not attempt to answer questions verbally or non-verbally      General Comments General comments (skin integrity, edema, etc.): at risk for skin breakdown; at arrival pillow  propped under L hip - repositioned to R hip for pressure relief    Exercises     Assessment/Plan    PT Assessment Patient needs continued PT services  PT Problem List Decreased strength;Decreased activity tolerance;Decreased balance;Decreased mobility;Decreased coordination;Decreased cognition;Decreased knowledge of use of DME;Decreased safety awareness;Decreased knowledge of precautions       PT Treatment Interventions DME instruction;Functional mobility training;Therapeutic activities;Therapeutic exercise;Balance training;Neuromuscular re-education;Cognitive remediation;Patient/family education    PT Goals (Current goals can be found in the Care Plan section)  Acute Rehab PT Goals Patient Stated Goal: none stated PT Goal Formulation: With patient Time For Goal Achievement: 10/08/17 Potential to Achieve Goals: Fair    Frequency Min 2X/week   Barriers to discharge        Co-evaluation               AM-PAC PT "6 Clicks" Daily Activity  Outcome Measure Difficulty turning over in bed (including adjusting bedclothes, sheets and blankets)?: Unable Difficulty moving from lying on back to sitting on the side of the bed? : Unable Difficulty sitting down on and standing up from a chair with arms (e.g., wheelchair, bedside commode, etc,.)?: Unable Help needed moving to and from a bed to chair (including a wheelchair)?: Total Help needed walking in hospital room?: Total Help needed climbing 3-5 steps with a railing? : Total 6 Click Score: 6    End of Session   Activity Tolerance: Patient limited by lethargy Patient left: in bed;with call bell/phone within reach;with bed alarm set Nurse Communication: Mobility status PT Visit Diagnosis: Unsteadiness on feet (R26.81);Other abnormalities of gait and mobility (R26.89);Muscle weakness (generalized) (M62.81);History of falling (Z91.81)    Time: 1345-1405 PT Time Calculation (min) (ACUTE ONLY): 20 min   Charges:   PT  Evaluation $PT Eval Moderate Complexity: 1 Mod        Kipp Laurence, PT, DPT 09/24/17 2:38 PM Pager: [phone]

## 2017-09-24 NOTE — Procedures (Addendum)
ELECTROENCEPHALOGRAM REPORT   Patient: Ebony [name]       Room #: [id number] EEG No. ID: [id number] Age: 84 y.o.        Sex: female Referring Physician: Rogelia BogaButcher Report Date:  09/24/2017        Interpreting Physician: Thana FarrEYNOLDS, Farid Grigorian  History: Ebony Devonancy C Weedon is an 84 y.o. female with altered mental status  Medications:  Flonase, Lipitor, Synthroid, Melatonin, Protonix  Conditions of Recording:  This is a 21 channel routine scalp EEG performed with bipolar and monopolar montages arranged in accordance to the international 10/20 system of electrode placement. One channel was dedicated to EKG recording.  The patient is in the drowsy and briefly asleep states.  Description:  The patient does not achieve wakefulness during the recording despite attempts to alert the patient.  Therefore wakefulness could not be evaluated.   The patient was in drowse for the entire recording.  During drowse the background was slow and irregular, consisting of low voltage theta and beta activity.    The patient goes in to a light sleep briefly with symmetrical sleep spindles, vertex central sharp transients and irregular slow activity.  No epileptiform activity is noted.   Hyperventilation and intermittent photic stimulation were not performed.   IMPRESSION: This is a normal drowsy and asleep electroencephalogram.  No epileptiform activity is noted.     Thana FarrLeslie Kosisochukwu Burningham, MD Neurology [phone] 09/24/2017, 12:31 PM

## 2017-09-24 NOTE — Progress Notes (Signed)
Was advised by nurse educator to  DC hypertonic NS order and was told by attending Internal Med attending to stop the fluid as we are not to run any hypertonic fluid.

## 2017-09-24 NOTE — Progress Notes (Signed)
EEG completed, results pending. 

## 2017-09-24 NOTE — Progress Notes (Signed)
Attending gave me verbal to stop 1.8%  NS.

## 2017-09-24 NOTE — Progress Notes (Signed)
1.8 % NS infusing per Pharmacy directions

## 2017-09-24 NOTE — Evaluation (Signed)
Clinical/Bedside Swallow Evaluation Patient Details  Name: Ebony [name] MRN: [id number] Date of Birth: Feb 14, 1932  Today's Date: 09/24/2017 Time: SLP Start Time (ACUTE ONLY): 1303 SLP Stop Time (ACUTE ONLY): 1311 SLP Time Calculation (min) (ACUTE ONLY): 8 min  Past Medical History:  Past Medical History:  Diagnosis Date  . Acute ischemic stroke (HCC) 04/11/2012  . Allergic rhinitis   . Anxiety   . Arthritis    Knees  . Dementia   . Depression   . Fibrocystic breast disease   . Hiatal hernia   . Hyperlipidemia   . Hypertension   . IFG (impaired fasting glucose)   . SDH (subdural hematoma) (HCC) 05/16/2011  . Subarachnoid hemorrhage following injury (HCC) 05/16/2011  . Subarachnoid hemorrhage following injury (HCC) 05/16/2011  . Unspecified hypothyroidism 04/15/2012   Past Surgical History:  Past Surgical History:  Procedure Laterality Date  . ABDOMINAL HYSTERECTOMY    . BREAST SURGERY    . EYE SURGERY    . TONSILLECTOMY     HPI:  Pt is an 84 year old female with a PMH of hypertension, atrial fibrillation (not on anticoagulation), HLD, hypothyroidism, GERD, CVA, subarachnoid hemorrhage (01/2017 of left frontal lobe), and dementia (MOCA 2018 with score 9), presenting for a code stroke after an unwitnessed fall, after which she was aphasic and unresponsive to staff. MRI negative for acute infarct. Per RN note in the ED, family denies a h/o dysphagia. Prior BSEs (three in 2018, one in 2014) all recommended regular or Dys 3 diet, thin liquids. Prior SLEs describe expressive aphasia at baseline although still communicative.   Assessment / Plan / Recommendation Clinical Impression  Pt is drowsy, requiring stimulation to open eyes and only maintaining eye opening briefly. SLP provided oral stimulation with minimal response. Pt did lick her lips and swallow x1, but otherwise did not open her mouth. No oral acceptance was noted. SLP to follow up to assess for readiness to start POs with  improved alertness and bolus awareness. SLP Visit Diagnosis: Dysphagia, unspecified (R13.10)    Aspiration Risk  Moderate aspiration risk    Diet Recommendation NPO   Medication Administration: Via alternative means    Other  Recommendations Oral Care Recommendations: Oral care QID   Follow up Recommendations Skilled Nursing facility      Frequency and Duration min 2x/week  2 weeks       Prognosis Prognosis for Safe Diet Advancement: Fair Barriers to Reach Goals: Cognitive deficits;Language deficits      Swallow Study   General HPI: Pt is an 84 year old female with a PMH of hypertension, atrial fibrillation (not on anticoagulation), HLD, hypothyroidism, GERD, CVA, subarachnoid hemorrhage (01/2017 of left frontal lobe), and dementia (MOCA 2018 with score 9), presenting for a code stroke after an unwitnessed fall, after which she was aphasic and unresponsive to staff. MRI negative for acute infarct. Per RN note in the ED, family denies a h/o dysphagia. Prior BSEs (three in 2018, one in 2014) all recommended regular or Dys 3 diet, thin liquids. Prior SLEs describe expressive aphasia at baseline although still communicative. Type of Study: Bedside Swallow Evaluation Previous Swallow Assessment: see HPI Diet Prior to this Study: NPO Temperature Spikes Noted: No Respiratory Status: Room air History of Recent Intubation: No Behavior/Cognition: Lethargic/Drowsy;Doesn't follow directions Oral Cavity Assessment: (UTA) Oral Care Completed by SLP: No Oral Cavity - Dentition: (UTA) Self-Feeding Abilities: Total assist Patient Positioning: Upright in bed Baseline Vocal Quality: Not observed Volitional Cough: Cognitively unable to elicit Volitional  Swallow: Unable to elicit    Oral/Motor/Sensory Function Overall Oral Motor/Sensory Function: (UTA)   Ice Chips Ice chips: Impaired Presentation: Spoon Oral Phase Impairments: Poor awareness of bolus;Other (comment)(no acceptance)   Thin  Liquid Thin Liquid: Not tested    Nectar Thick Nectar Thick Liquid: Not tested   Honey Thick Honey Thick Liquid: Not tested   Puree Puree: Impaired Presentation: Spoon Oral Phase Impairments: Poor awareness of bolus;Other (comment)(no acceptance)   Solid     Solid: Not tested      Ebony [name], Ebony [name] 09/24/2017,1:34 PM  Ebony HamLaura [name], M.A. CCC-SLP [phone]

## 2017-09-25 ENCOUNTER — Inpatient Hospital Stay (HOSPITAL_COMMUNITY): Payer: Medicare HMO

## 2017-09-25 DIAGNOSIS — I4891 Unspecified atrial fibrillation: Secondary | ICD-10-CM

## 2017-09-25 DIAGNOSIS — Z8673 Personal history of transient ischemic attack (TIA), and cerebral infarction without residual deficits: Secondary | ICD-10-CM

## 2017-09-25 LAB — CBC
HCT: 34.4 % — ABNORMAL LOW (ref 36.0–46.0)
Hemoglobin: 11.4 g/dL — ABNORMAL LOW (ref 12.0–15.0)
MCH: 27.1 pg (ref 26.0–34.0)
MCHC: 33.1 g/dL (ref 30.0–36.0)
MCV: 81.9 fL (ref 78.0–100.0)
Platelets: 213 10*3/uL (ref 150–400)
RBC: 4.2 MIL/uL (ref 3.87–5.11)
RDW: 14.2 % (ref 11.5–15.5)
WBC: 9.9 10*3/uL (ref 4.0–10.5)

## 2017-09-25 LAB — BASIC METABOLIC PANEL
Anion gap: 10 (ref 5–15)
BUN: 9 mg/dL (ref 8–23)
CO2: 27 mmol/L (ref 22–32)
CREATININE: 0.89 mg/dL (ref 0.44–1.00)
Calcium: 8.9 mg/dL (ref 8.9–10.3)
Chloride: 91 mmol/L — ABNORMAL LOW (ref 98–111)
GFR calc Af Amer: >60 mL/min (ref >60)
GFR, EST NON AFRICAN AMERICAN: 58 mL/min — AB (ref >60)
Glucose, Bld: 98 mg/dL (ref 70–99)
Potassium: 3.5 mmol/L (ref 3.5–5.1)
Sodium: 128 mmol/L — ABNORMAL LOW (ref 135–145)

## 2017-09-25 LAB — UREA NITROGEN, URINE: Urea Nitrogen, Ur: 144 mg/dL

## 2017-09-25 MED ORDER — GADOBENATE DIMEGLUMINE 529 MG/ML IV SOLN
17.0000 mL | Freq: Once | INTRAVENOUS | Status: AC | PRN
  Administered 2017-09-25: 17 mL via INTRAVENOUS

## 2017-09-25 MED ORDER — LABETALOL HCL 5 MG/ML IV SOLN
10.0000 mg | Freq: Once | INTRAVENOUS | Status: AC
Start: 2017-09-25 — End: 2017-09-25
  Administered 2017-09-25: 10 mg via INTRAVENOUS
  Filled 2017-09-25: qty 4

## 2017-09-25 NOTE — NC FL2 (Signed)
[place] MEDICAID FL2 LEVEL OF CARE SCREENING TOOL     IDENTIFICATION  Patient Name: Ebony [name] Birthdate: Feb 13, 1932 Sex: female Admission Date (Current Location): 09/23/2017  [facility] [place]   Facility and Address:  [facility], [address], [place], [place] [address]      Provider Number: [id number]  Attending Physician Name and Address:  Burns SpainButcher, Elizabeth A, MD  Relative Name and Phone Number:  Darl PikesSusan, daughter, [phone]    Current Level of Care: Hospital Recommended Level of Care: Skilled Nursing Facility Prior Approval Number:    Date Approved/Denied:   PASRR Number:   [id number]  Discharge Plan: SNF    Current Diagnoses: Patient Active Problem List   Diagnosis Date Noted  . Fall 09/24/2017  . Paroxysmal A-fib (HCC) 09/24/2017  . Hypothyroidism 09/24/2017  . Elevated troponin 09/24/2017  . QT prolongation 09/24/2017  . Syncope and collapse 09/23/2017  . ICH (intracerebral hemorrhage) (HCC) 02/06/2017  . Urinary tract infection due to Enterococcus   . Intraventricular hemorrhage (HCC)   . History of recent fall   . Vascular dementia, uncomplicated   . Acute cystitis without hematuria   . Intracranial hemorrhage (HCC) 10/20/2016  . Hyponatremia 10/08/2016  . Hypertensive urgency 10/08/2016  . Polypharmacy   . Paroxysmal supraventricular tachycardia (HCC)   . Recurrent falls 09/04/2016  . Maxillary sinus fracture (HCC) 09/04/2016  . Closed fracture of nasal bone 09/04/2016  . Acute encephalopathy   . Expressive aphasia   . History of TIA (transient ischemic attack) and stroke 06/24/2016  . Depression with anxiety 06/24/2016  . Hypothyroid 06/24/2016  . GERD (gastroesophageal reflux disease) 06/24/2016  . Allergic rhinitis 06/08/2014  . DDD (degenerative disc disease), lumbar 06/08/2014  . Insomnia 12/07/2013  . Depression   . OA (osteoarthritis) 01/06/2013  . Loss of weight 07/13/2012  .  Vascular dementia without behavioral disturbance 06/20/2012  . Hypothyroidism due to acquired atrophy of thyroid 04/15/2012  . Hypokalemia 04/10/2012  . Essential hypertension 05/16/2011  . Anxiety disorder 05/16/2011    Orientation RESPIRATION BLADDER Height & Weight     (Disoriented x4)  Normal Incontinent Weight: 78.3 kg (172 lb 9.9 oz) Height:  5\' 2"  (157.5 cm)  BEHAVIORAL SYMPTOMS/MOOD NEUROLOGICAL BOWEL NUTRITION STATUS      Continent Diet(Please see DC Summary)  AMBULATORY STATUS COMMUNICATION OF NEEDS Skin   Extensive Assist Verbally Normal                       Personal Care Assistance Level of Assistance  Bathing, Feeding, Dressing Bathing Assistance: Maximum assistance Feeding assistance: Maximum assistance Dressing Assistance: Maximum assistance     Functional Limitations Info  Sight, Hearing, Speech Sight Info: Adequate Hearing Info: Adequate Speech Info: Adequate    SPECIAL CARE FACTORS FREQUENCY                       Contractures      Additional Factors Info  Code Status, Allergies Code Status Info: DNR Allergies Info: Sulfa Antibiotics, Biaxin Clarithromycin, Penicillins, Klor-con Potassium Chloride, Ace Inhibitors, Cozaar Losartan Potassium           Current Medications (09/25/2017):  This is the current hospital active medication list Current Facility-Administered Medications  Medication Dose Route Frequency Provider Last Rate Last Dose  .  stroke: mapping our early stages of recovery book   Does not apply Once Eulah PontBlum, Nina, MD      .  atorvastatin (LIPITOR) tablet 20 mg  20 mg Oral q1800 Eulah Pont, MD   Stopped at 09/24/17 1841  . fluticasone (FLONASE) 50 MCG/ACT nasal spray 2 spray  2 spray Each Nare Daily Eulah Pont, MD      . hydroxypropyl methylcellulose / hypromellose (ISOPTO TEARS / GONIOVISC) 2.5 % ophthalmic solution 1 drop  1 drop Both Eyes QPM Eulah Pont, MD   1 drop at 09/24/17 1828  . labetalol (NORMODYNE,TRANDATE) injection  5 mg  5 mg Intravenous Q6H PRN Nyra Market, MD   5 mg at 09/24/17 2121  . levothyroxine (SYNTHROID, LEVOTHROID) injection 12.5 mcg  12.5 mcg Intravenous Daily Nyra Market, MD   12.5 mcg at 09/24/17 1845  . Melatonin TABS 3 mg  3 mg Oral QHS Eulah Pont, MD      . pantoprazole (PROTONIX) EC tablet 20 mg  20 mg Oral Danella Deis, MD         Discharge Medications: Please see discharge summary for a list of discharge medications.  Relevant Imaging Results:  Relevant Lab Results:   Additional Information SS#: [id number]  Mearl Latin, LCSWA

## 2017-09-25 NOTE — Progress Notes (Signed)
Reason for consult:   Subjective: More somnolent, minimally interactive   ROS:  Unable to obtain due to poor mental status  Examination  Vital signs in last 24 hours: Temp:  [98 F (36.7 C)-99.5 F (37.5 C)] 98 F (36.7 C) (07/27 1522) Pulse Rate:  [74-87] 87 (07/27 1522) Resp:  [13-21] 13 (07/27 1522) BP: (110-187)/(58-96) 172/93 (07/27 1522) SpO2:  [94 %-100 %] 100 % (07/27 1522)  General: Not in distress, cooperative CVS: pulse-normal rate and rhythm RS: breathing comfortably Extremities: normal   Neuro: MS  Basic Metabolic Panel: Recent Labs  Lab 09/23/17 2219  09/24/17 1349 09/24/17 1522 09/24/17 1840 09/24/17 2157 09/25/17 0718  NA 127*   < > 134* 125* 127* 128* 128*  K 3.5   < > 2.0* 3.6 3.7 3.6 3.5  CL 89*   < > 111 85* 90* 89* 91*  CO2 26   < > 19* 27 26 27 27   GLUCOSE 131*   < > 73 102* 113* 105* 98  BUN 11   < > 8 11 11 10 9   CREATININE 0.89   < > 0.53 0.94 0.87 0.88 0.89  CALCIUM 8.9   < > 5.2* 9.1 8.8* 9.0 8.9  PHOS 2.9  --   --   --   --   --   --    < > = values in this interval not displayed.    CBC: Recent Labs  Lab 09/23/17 1656 09/23/17 1702 09/25/17 0718  WBC 10.3  --  9.9  NEUTROABS 6.7  --   --   HGB 11.6* 11.9* 11.4*  HCT 34.3* 35.0* 34.4*  MCV 80.9  --  81.9  PLT 253  --  213     Coagulation Studies: Recent Labs    09/23/17 1656  LABPROT 12.6  INR 0.95    Imaging Reviewed:     ASSESSMENT AND PLAN 84 y.o. female with a past medical history of hypertension, A. fib not on anticoagulation due to previous intracranial hemorrhage, dyslipidemia, hypothyroidism, depression, prior cerebrovascular accident, history of subarachnoid hemorrhage (last on 02/09/2017 - left frontal lobe hematoma 29cc), and subdural hematomavascular dementia and prior records frequent falls, intermittent aphasia and right sided weakness who presents from place with syncope with  aphasia.  She was found to be hyponatremic and it was thought that  was the etiology for her Altered mental status.  She continued to remain lethargic and repeat CT head showed a new intraventricular hemorrhage.  MRI brain confirms intraventricular hemorrhage as well-as possible amyloid angiopathy-however not many microhemorrhages are seen.  An EEG was obtained yesterday which showed no seizures     Encephalopathy in the setting of hyponatremia Intraventricular hemorrhage   Will order MRI brain w/contrast to look for underlying vascular lesion PRN labetalol for BP greater than 160 systolic Neurochecks and NIHSS  NPO until swallow eval Hold ASA and sq heparin      Sushanth Aroor [facility] Pager Number [phone] For questions after 7pm please refer to AMION to reach the Neurologist on call

## 2017-09-25 NOTE — Clinical Social Work Note (Signed)
Clinical Social Work Assessment  Patient Details  Name: Ebony [name] MRN: [id number] Date of Birth: 1931-11-14  Date of referral:  09/25/17               Reason for consult:  Facility Placement                Permission sought to share information with:  Family Supports Permission granted to share information::     Name::     Darl PikesSusan  Agency::  [facility]  Relationship::  daughter  Contact Information:  [phone]  Housing/Transportation Living arrangements for the past 2 months:  Skilled Nursing Facility Source of Information:  Adult Children Patient Interpreter Needed:  None Criminal Activity/Legal Involvement Pertinent to Current Situation/Hospitalization:  No - Comment as needed Significant Relationships:  Adult Children Lives with:  Self, Facility Resident Do you feel safe going back to the place where you live?  No Need for family participation in patient care:  No (Coment)  Care giving concerns:  Pt is not responsive. CSW spoke with pt's daughter via telephone.   Social Worker assessment / plan:  CSW spoke with pt's daughter via telephone. Pt's daughter confirmed pt is LTC from [facility]. Pt's daughter also confirmed that the plan would be for pt to return to [facility] at d/c. CSW to follow up with facility.  Employment status:  Retired Health and safety inspectornsurance information:  Medicare PT Recommendations:  Skilled Nursing Facility Information / Referral to community resources:  Skilled Nursing Facility  Patient/Family's Response to care:  Pt's daughter verbalized understanding of CSW role and expressed appreciation for support. Pt's daughter denies any concern regarding pt care at this time.   Patient/Family's Understanding of and Emotional Response to Diagnosis, Current Treatment, and Prognosis:  Pt's daughter understanding and realistic regarding pt's physical limitations. Pt's daughter understands the need for pt to return to SNF at d/c, pt's daughter agreeable at this time.  Pt's daughter denies any concern regarding treatment pt's plan at this time. CSW will continue to provide support and facilitate d/c needs.   Emotional Assessment Appearance:  Appears stated age Attitude/Demeanor/Rapport:  Unable to Assess Affect (typically observed):  Unable to Assess Orientation:  (Not responding) Alcohol / Substance use:  Not Applicable Psych involvement (Current and /or in the community):  No (Comment)  Discharge Needs  Concerns to be addressed:  Basic Needs Readmission within the last 30 days:  No Current discharge risk:  Dependent with Mobility Barriers to Discharge:  Continued Medical Work up   Pacific MutualBridget A Thorn Demas, LCSW 09/25/2017, 3:18 PM

## 2017-09-25 NOTE — Progress Notes (Signed)
   Subjective: Hospital day 2  Today, Ms. Dagher was examined at bedside and more alert and active this morning.  She however continues to remain aphasic but responds to commands by moving her extremities.  Unable to assess neurological or cardiac review of systems due to aphasia.  Objective:  Vital signs in last 24 hours: Vitals:   09/24/17 2321 09/25/17 0322 09/25/17 0902 09/25/17 1122  BP: (!) 150/96 (!) 110/58 (!) 163/83 (!) 184/88  Pulse:   86 74  Resp: 16 17 16 14   Temp: 99 F (37.2 C)  98.5 F (36.9 C) 98.5 F (36.9 C)  TempSrc: Axillary  Oral Oral  SpO2: 99% 94% 95% 94%  Weight:      Height:       Physical exams Constitutional: Awake, alert, follow commands Cardiovascular: RRR, no murmurs, gallops, rubs Respiratory: CTA BL Abdomen: Soft, nondistended, nontender to palpation Neurological: - Moves all 4 extremities on command - Able to wiggle toes on command - Able to move tongue on command  Assessment/Plan:  Principal Problem:   Hyponatremia Active Problems:   Hypokalemia   Hypertensive urgency   Fall   Paroxysmal A-fib (HCC)   Hypothyroidism   Elevated troponin   QT prolongation   Ebony [name] is an 84 year old Caucasian female with past medical history significant for hypertension, atrial fibrillation currently not on anticoagulation, previous CVA, previous subarachnoid hemorrhage (on 02/09/2017), hyperlipidemia, hypothyroidism, depression, left temporal lobe hematoma, dementia, frequent falls, intermittent aphasia and right-sided weakness who [facility] after an unwitnessed fall and found to hypotonic hyponatremia on arrival which has resolved.  Recently found to have new small intraventricular hemorrhage without hydrocephalus and nondisplaced acute skull fractures.   Aphasia S/p unwitnessed fall Since admission, has remained aphasic and nonverbal.  However today she is more alert as she is able to follow commands by moving  extremities.  Neurological exam today showed improvement of strength and cooperativity.  Recent CT brain on 09/25/2017 revealed new small intraventricular hemorrhage without hydrocephalus and nondisplaced acute skull fractures.  Her global aphasia and decreased neurological status from baseline may be partly due to the recent findings of intracranial hemorrhage.  Neurosurgery was consulted and recommended no surgical intervention.  Neurology was consulted as well and currently awaiting recommendation -Appreciate neurology reccs -Continue telemetry  -q4 neuro checks -NPO awaiting speech therapy evaluation -Aspirin 81mg  -Lipitor 20mg   Hypotonic hyponatremia, improved Currently sNa of 128 -s/p 120 cc of 3% hypertonic saline -s/p maintenance NS at 110000mL/hr -Continue to monitor -F/U A.m. BMP  Advanced dementia Nonverbal and nonambulatory at baseline.  [facility] and will most likely be discharged therapy once stable.  Hypertension Has had labile BP.  Allowing for permissive hypertension with BP >180.  -IV labetalol 5 mg q6 prn SBP >180  Hypothyroidism  Continue IV synthroid 12.5 mcg daily   GERD  Continue Protonix 20mg       FEN:No fluids, replete lytes prn,NPO VTE ppx: Lovenox  Code Status: DNR  Dispo: Anticipated discharge in approximately 1-2 day(s) pending neuro reccs.   Yvette RackAgyei, Kristof Nadeem K, MD 09/25/2017, 1:47 PM Pager: [phone]

## 2017-09-25 NOTE — Consult Note (Signed)
Reason for Consult: intraventricular hemorrhage Referring Physician: [facility]  Ebony [name] is an 84 y.o. female.  HPI: 84 year old female with long-standing history dimension nursing home DO NOT RESUSCITATE presented with a fascia and ultimately mental status workup initially with a negative CT subsequent MRI scan and CT showed a small amount of intraventricular hemorrhage however MRI scan also showed areas suggestive of amyloid angiopathy.  Past Medical History:  Diagnosis Date  . Acute ischemic stroke ([place]) 04/11/2012  . Allergic rhinitis   . Anxiety   . Arthritis    Knees  . Dementia   . Depression   . Fibrocystic breast disease   . Hiatal hernia   . Hyperlipidemia   . Hypertension   . IFG (impaired fasting glucose)   . SDH (subdural hematoma) ([place]) 05/16/2011  . Subarachnoid hemorrhage following injury ([place]) 05/16/2011  . Subarachnoid hemorrhage following injury ([place]) 05/16/2011  . Unspecified hypothyroidism 04/15/2012    Past Surgical History:  Procedure Laterality Date  . ABDOMINAL HYSTERECTOMY    . BREAST SURGERY    . EYE SURGERY    . TONSILLECTOMY      Family History  Problem Relation Age of Onset  . Lung cancer Sister   . CVA Neg Hx   . Diabetes Neg Hx   . Hypertension Neg Hx     Social History:  reports that she has never smoked. She has never used smokeless tobacco. She reports that she does not drink alcohol or use drugs.  Allergies:  Allergies  Allergen Reactions  . Sulfa Antibiotics Diarrhea  . Biaxin [Clarithromycin] Diarrhea  . Penicillins Diarrhea    Has patient had a PCN reaction causing immediate rash, facial/tongue/throat swelling, SOB or lightheadedness with hypotension: Unknown Has patient had a PCN reaction causing severe rash involving mucus membranes or skin necrosis: Unknown Has patient had a PCN reaction that required hospitalization: Unknown Has patient had a PCN reaction occurring within the last 10 years: Unknown If all of  the above answers are "NO", then may proceed with Cephalosporin use.   Marland Kitchen Klor-Con [Potassium Chloride] Other (See Comments)    Noted to be an allergy on the MAR  . Ace Inhibitors     Cough with some Ace inhibitors.  Patient tolerates lisinopril and takes everyday.   Blanchard Kelch [Losartan Potassium] Rash    Medications: I have reviewed the patient's current medications.  Results for orders placed or performed during the hospital encounter of 09/23/17 (from the past 48 hour(s))  CBG monitoring, ED     Status: Abnormal   Collection Time: 09/23/17  4:55 PM  Result Value Ref Range   Glucose-Capillary 130 (H) 70 - 99 mg/dL  Ethanol     Status: None   Collection Time: 09/23/17  4:56 PM  Result Value Ref Range   Alcohol, Ethyl (B) <10 <10 mg/dL    Comment: (NOTE) Lowest detectable limit for serum alcohol is 10 mg/dL. For medical purposes only. Performed at [facility], [place] [address] [place], [place] [address]   Protime-INR     Status: None   Collection Time: 09/23/17  4:56 PM  Result Value Ref Range   Prothrombin Time 12.6 11.4 - 15.2 seconds   INR 0.95     Comment: Performed at [facility] [address], [place], [place] [address]  APTT     Status: None   Collection Time: 09/23/17  4:56 PM  Result Value Ref Range   aPTT 32 24 - 36  seconds    Comment: Performed at [facility], [place] [address], [place], [place] [address]  CBC     Status: Abnormal   Collection Time: 09/23/17  4:56 PM  Result Value Ref Range   WBC 10.3 4.0 - 10.5 K/uL   RBC 4.24 3.87 - 5.11 MIL/uL   Hemoglobin 11.6 (L) 12.0 - 15.0 g/dL   HCT 34.3 (L) 36.0 - 46.0 %   MCV 80.9 78.0 - 100.0 fL   MCH 27.4 26.0 - 34.0 pg   MCHC 33.8 30.0 - 36.0 g/dL   RDW 13.9 11.5 - 15.5 %   Platelets 253 150 - 400 K/uL    Comment: Performed at [facility] [address] [place], [place] [address]  Differential     Status: None   Collection Time: 09/23/17  4:56 PM  Result Value Ref Range    Neutrophils Relative % 64 %   Neutro Abs 6.7 1.7 - 7.7 K/uL   Lymphocytes Relative 26 %   Lymphs Abs 2.7 0.7 - 4.0 K/uL   Monocytes Relative 8 %   Monocytes Absolute 0.8 0.1 - 1.0 K/uL   Eosinophils Relative 1 %   Eosinophils Absolute 0.1 0.0 - 0.7 K/uL   Basophils Relative 0 %   Basophils Absolute 0.0 0.0 - 0.1 K/uL   Immature Granulocytes 1 %   Abs Immature Granulocytes 0.1 0.0 - 0.1 K/uL    Comment: Performed at [facility] [address] [place], [place] [address]  Comprehensive metabolic panel     Status: Abnormal   Collection Time: 09/23/17  4:56 PM  Result Value Ref Range   Sodium 122 (L) 135 - 145 mmol/L   Potassium 3.5 3.5 - 5.1 mmol/L   Chloride 83 (L) 98 - 111 mmol/L   CO2 25 22 - 32 mmol/L   Glucose, Bld 126 (H) 70 - 99 mg/dL   BUN 13 8 - 23 mg/dL   Creatinine, Ser 1.05 (H) 0.44 - 1.00 mg/dL   Calcium 9.1 8.9 - 10.3 mg/dL   Total Protein 6.2 (L) 6.5 - 8.1 g/dL   Albumin 3.8 3.5 - 5.0 g/dL   AST 22 15 - 41 U/L   ALT 16 0 - 44 U/L   Alkaline Phosphatase 117 38 - 126 U/L   Total Bilirubin 0.6 0.3 - 1.2 mg/dL   GFR calc non Af Amer 48 (L) >60 mL/min   GFR calc Af Amer 55 (L) >60 mL/min    Comment: (NOTE) The eGFR has been calculated using the CKD EPI equation. This calculation has not been validated in all clinical situations. eGFR's persistently <60 mL/min signify possible Chronic Kidney Disease.    Anion gap 14 5 - 15    Comment: Performed at [facility] [address], [place],  [address]  I-stat troponin, ED     Status: None   Collection Time: 09/23/17  5:00 PM  Result Value Ref Range   Troponin i, poc 0.01 0.00 - 0.08 ng/mL   Comment 3            Comment: Due to the release kinetics of cTnI, a negative result within the first hours of the onset of symptoms does not rule out myocardial infarction with certainty. If myocardial infarction is still suspected, repeat the test at appropriate intervals.   I-Stat Chem 8, ED     Status:  Abnormal   Collection Time: 09/23/17  5:02 PM  Result Value Ref  Range   Sodium 118 (LL) 135 - 145 mmol/L   Potassium 3.3 (L) 3.5 - 5.1 mmol/L   Chloride 82 (L) 98 - 111 mmol/L   BUN 15 8 - 23 mg/dL   Creatinine, Ser 0.90 0.44 - 1.00 mg/dL   Glucose, Bld 127 (H) 70 - 99 mg/dL   Calcium, Ion 1.09 (L) 1.15 - 1.40 mmol/L   TCO2 25 22 - 32 mmol/L   Hemoglobin 11.9 (L) 12.0 - 15.0 g/dL   HCT 35.0 (L) 36.0 - 46.0 %   Comment NOTIFIED PHYSICIAN   TSH     Status: None   Collection Time: 09/23/17  7:03 PM  Result Value Ref Range   TSH 1.203 0.350 - 4.500 uIU/mL    Comment: Performed by a 3rd Generation assay with a functional sensitivity of <=0.01 uIU/mL. Performed at [facility], [place] [address] [place], [place] [address]   Osmolality     Status: Abnormal   Collection Time: 09/23/17  7:03 PM  Result Value Ref Range   Osmolality 252 (L) 275 - 295 mOsm/kg    Comment: Performed at [facility], [place] [address] [place], [place] [address]  Cortisol     Status: None   Collection Time: 09/23/17  7:03 PM  Result Value Ref Range   Cortisol, Plasma 22.6 ug/dL    Comment: (NOTE) AM    6.7 - 22.6 ug/dL PM   <10.0       ug/dL Performed at [facility] [address], [place], [place] [address]   Urine rapid drug screen (hosp performed)     Status: None   Collection Time: 09/23/17  7:50 PM  Result Value Ref Range   Opiates NONE DETECTED NONE DETECTED   Cocaine NONE DETECTED NONE DETECTED   Benzodiazepines NONE DETECTED NONE DETECTED   Amphetamines NONE DETECTED NONE DETECTED   Tetrahydrocannabinol NONE DETECTED NONE DETECTED   Barbiturates NONE DETECTED NONE DETECTED    Comment: (NOTE) DRUG SCREEN FOR MEDICAL PURPOSES ONLY.  IF CONFIRMATION IS NEEDED FOR ANY PURPOSE, NOTIFY LAB WITHIN 5 DAYS. LOWEST DETECTABLE LIMITS FOR URINE DRUG SCREEN Drug Class                     Cutoff (ng/mL) Amphetamine and metabolites    1000 Barbiturate and metabolites     200 Benzodiazepine                 527 Tricyclics and metabolites     300 Opiates and metabolites        300 Cocaine and metabolites        300 THC                            50 Performed at [facility], [place] [address] [place], [place] [address]   Urinalysis, Routine w reflex microscopic     Status: Abnormal   Collection Time: 09/23/17  7:50 PM  Result Value Ref Range   Color, Urine STRAW (A) YELLOW   APPearance CLEAR CLEAR   Specific Gravity, Urine 1.005 1.005 - 1.030   pH 7.0 5.0 - 8.0   Glucose, UA NEGATIVE NEGATIVE mg/dL   Hgb urine dipstick NEGATIVE NEGATIVE   Bilirubin Urine NEGATIVE NEGATIVE   Ketones, ur NEGATIVE NEGATIVE mg/dL   Protein, ur 30 (A) NEGATIVE mg/dL   Nitrite NEGATIVE NEGATIVE   Leukocytes, UA NEGATIVE NEGATIVE   RBC /  HPF 0-5 0 - 5 RBC/hpf   WBC, UA 0-5 0 - 5 WBC/hpf   Bacteria, UA RARE (A) NONE SEEN    Comment: Performed at [facility], [place] [address], [place], [place] [address]  Osmolality, urine     Status: Abnormal   Collection Time: 09/23/17  7:50 PM  Result Value Ref Range   Osmolality, Ur 287 (L) 300 - 900 mOsm/kg    Comment: Performed at [facility] [address] [place], [place] [address]  Creatinine, urine, random     Status: None   Collection Time: 09/23/17  7:50 PM  Result Value Ref Range   Creatinine, Urine 21.74 mg/dL    Comment: Performed at [facility] [address] [place], [place] [address]  Na and K (sodium & potassium), rand urine     Status: None   Collection Time: 09/23/17  7:50 PM  Result Value Ref Range   Sodium, Ur 99 mmol/L   Potassium Urine 16 mmol/L    Comment: Performed at [facility], [place] [address] [place], [place] [address]  Culture, blood (routine x 2)     Status: None (Preliminary result)   Collection Time: 09/23/17  8:00 PM  Result Value Ref Range   Specimen Description BLOOD LEFT WRIST    Special Requests      BOTTLES DRAWN AEROBIC ONLY Blood Culture results may not be  optimal due to an inadequate volume of blood received in culture bottles   Culture      NO GROWTH < 12 HOURS Performed at [facility] [address] [place], [place] [address]    Report Status PENDING   Culture, blood (routine x 2)     Status: None (Preliminary result)   Collection Time: 09/23/17  8:08 PM  Result Value Ref Range   Specimen Description BLOOD RIGHT WRIST    Special Requests      BOTTLES DRAWN AEROBIC AND ANAEROBIC Blood Culture results may not be optimal due to an inadequate volume of blood received in culture bottles   Culture      NO GROWTH < 12 HOURS Performed at [facility] [address] [place], [place] [address]    Report Status PENDING   Basic metabolic panel     Status: Abnormal   Collection Time: 09/23/17 10:19 PM  Result Value Ref Range   Sodium 127 (L) 135 - 145 mmol/L    Comment: DELTA CHECK NOTED   Potassium 3.5 3.5 - 5.1 mmol/L   Chloride 89 (L) 98 - 111 mmol/L   CO2 26 22 - 32 mmol/L   Glucose, Bld 131 (H) 70 - 99 mg/dL   BUN 11 8 - 23 mg/dL   Creatinine, Ser 0.89 0.44 - 1.00 mg/dL   Calcium 8.9 8.9 - 10.3 mg/dL   GFR calc non Af Amer 58 (L) >60 mL/min   GFR calc Af Amer >60 >60 mL/min    Comment: (NOTE) The eGFR has been calculated using the CKD EPI equation. This calculation has not been validated in all clinical situations. eGFR's persistently <60 mL/min signify possible Chronic Kidney Disease.    Anion gap 12 5 - 15    Comment: Performed at [facility] [address], [place],  [address]  Phosphorus     Status: None   Collection Time: 09/23/17 10:19 PM  Result Value Ref Range   Phosphorus 2.9 2.5 - 4.6 mg/dL  Comment: Performed at [facility], [place] [address], [place], [place] [address]  Hemoglobin A1c     Status: None   Collection Time: 09/24/17 12:13 AM  Result Value Ref Range   Hgb A1c MFr Bld 5.5 4.8 - 5.6 %    Comment: (NOTE) Pre diabetes:          5.7%-6.4% Diabetes:               >6.4% Glycemic control for   <7.0% adults with diabetes    Mean Plasma Glucose 111.15 mg/dL    Comment: Performed at [facility] [address], [place], [place] [address]  Lipid panel     Status: None   Collection Time: 09/24/17 12:24 AM  Result Value Ref Range   Cholesterol 133 0 - 200 mg/dL   Triglycerides 52 <150 mg/dL   HDL 67 >40 mg/dL   Total CHOL/HDL Ratio 2.0 RATIO   VLDL 10 0 - 40 mg/dL   LDL Cholesterol 56 0 - 99 mg/dL    Comment:        Total Cholesterol/HDL:CHD Risk Coronary Heart Disease Risk Table                     Men   Women  1/2 Average Risk   3.4   3.3  Average Risk       5.0   4.4  2 X Average Risk   9.6   7.1  3 X Average Risk  23.4   11.0        Use the calculated Patient Ratio above and the CHD Risk Table to determine the patient's CHD Risk.        ATP III CLASSIFICATION (LDL):  <100     mg/dL   Optimal  100-129  mg/dL   Near or Above                    Optimal  130-159  mg/dL   Borderline  160-189  mg/dL   High  >190     mg/dL   Very High Performed at [facility] [address] [place], [place] [address]   Basic metabolic panel     Status: Abnormal   Collection Time: 09/24/17 12:24 AM  Result Value Ref Range   Sodium 123 (L) 135 - 145 mmol/L   Potassium 3.5 3.5 - 5.1 mmol/L   Chloride 82 (L) 98 - 111 mmol/L   CO2 26 22 - 32 mmol/L   Glucose, Bld 143 (H) 70 - 99 mg/dL   BUN 13 8 - 23 mg/dL   Creatinine, Ser 0.94 0.44 - 1.00 mg/dL   Calcium 9.4 8.9 - 10.3 mg/dL   GFR calc non Af Amer 55 (L) >60 mL/min   GFR calc Af Amer >60 >60 mL/min    Comment: (NOTE) The eGFR has been calculated using the CKD EPI equation. This calculation has not been validated in all clinical situations. eGFR's persistently <60 mL/min signify possible Chronic Kidney Disease.    Anion gap 15 5 - 15    Comment: Performed at [facility] [address], [place], [place] [address]  Troponin I     Status: Abnormal   Collection Time: 09/24/17  12:24 AM  Result Value Ref Range   Troponin I 0.06 (HH) <0.03 ng/mL    Comment: CRITICAL RESULT CALLED TO, READ BACK BY AND VERIFIED WITH: LOWREY,K RN 09/24/2017 0124 JORDANS Performed at  [facility], [place] [address], [place], [place] [address]   Basic metabolic panel     Status: Abnormal   Collection Time: 09/24/17  3:48 AM  Result Value Ref Range   Sodium 134 (L) 135 - 145 mmol/L   Potassium 2.1 (LL) 3.5 - 5.1 mmol/L    Comment: CRITICAL RESULT CALLED TO, READ BACK BY AND VERIFIED WITH: Ledon Snare [id number] [facility]    Chloride 108 98 - 111 mmol/L   CO2 20 (L) 22 - 32 mmol/L   Glucose, Bld 92 70 - 99 mg/dL   BUN 8 8 - 23 mg/dL   Creatinine, Ser 0.56 0.44 - 1.00 mg/dL   Calcium 5.5 (LL) 8.9 - 10.3 mg/dL    Comment: CRITICAL RESULT CALLED TO, READ BACK BY AND VERIFIED WITH: S MORTON,RN 0527 [id number] WILDERK    GFR calc non Af Amer >60 >60 mL/min   GFR calc Af Amer >60 >60 mL/min    Comment: (NOTE) The eGFR has been calculated using the CKD EPI equation. This calculation has not been validated in all clinical situations. eGFR's persistently <60 mL/min signify possible Chronic Kidney Disease.    Anion gap 6 5 - 15    Comment: Performed at [facility] [address] [place], [place] [address]  MRSA PCR Screening     Status: None   Collection Time: 09/24/17  4:34 AM  Result Value Ref Range   MRSA by PCR NEGATIVE NEGATIVE    Comment:        The GeneXpert MRSA Assay (FDA approved for NASAL specimens only), is one component of a comprehensive MRSA colonization surveillance program. It is not intended to diagnose MRSA infection nor to guide or monitor treatment for MRSA infections. Performed at [facility], [place] [address], [place], [place] [address]   Basic metabolic panel     Status: Abnormal   Collection Time: 09/24/17  5:50 AM  Result Value Ref Range   Sodium 124 (L) 135 - 145 mmol/L   Potassium 3.9 3.5 - 5.1 mmol/L   Chloride 85 (L) 98 - 111  mmol/L   CO2 25 22 - 32 mmol/L   Glucose, Bld 120 (H) 70 - 99 mg/dL   BUN 12 8 - 23 mg/dL   Creatinine, Ser 0.93 0.44 - 1.00 mg/dL   Calcium 9.1 8.9 - 10.3 mg/dL   GFR calc non Af Amer 55 (L) >60 mL/min   GFR calc Af Amer >60 >60 mL/min    Comment: (NOTE) The eGFR has been calculated using the CKD EPI equation. This calculation has not been validated in all clinical situations. eGFR's persistently <60 mL/min signify possible Chronic Kidney Disease.    Anion gap 14 5 - 15    Comment: Performed at [facility] [address], [place], [place] [address]  Troponin I     Status: Abnormal   Collection Time: 09/24/17  5:50 AM  Result Value Ref Range   Troponin I 0.07 (HH) <0.03 ng/mL    Comment: CRITICAL VALUE NOTED.  VALUE IS CONSISTENT WITH PREVIOUSLY REPORTED AND CALLED VALUE. Performed at [facility], [place] [address] [place], [place] [address]   Basic metabolic panel     Status: Abnormal   Collection Time: 09/24/17 10:10 AM  Result Value Ref Range   Sodium 125 (L) 135 - 145 mmol/L   Potassium 3.7 3.5 - 5.1 mmol/L   Chloride 87 (L) 98 - 111 mmol/L  CO2 25 22 - 32 mmol/L   Glucose, Bld 115 (H) 70 - 99 mg/dL   BUN 13 8 - 23 mg/dL   Creatinine, Ser 1.02 (H) 0.44 - 1.00 mg/dL   Calcium 9.0 8.9 - 10.3 mg/dL   GFR calc non Af Amer 49 (L) >60 mL/min   GFR calc Af Amer 57 (L) >60 mL/min    Comment: (NOTE) The eGFR has been calculated using the CKD EPI equation. This calculation has not been validated in all clinical situations. eGFR's persistently <60 mL/min signify possible Chronic Kidney Disease.    Anion gap 13 5 - 15    Comment: Performed at [facility] [address], [place], [place] [address]  Basic metabolic panel     Status: Abnormal   Collection Time: 09/24/17  1:49 PM  Result Value Ref Range   Sodium 134 (L) 135 - 145 mmol/L   Potassium 2.0 (LL) 3.5 - 5.1 mmol/L    Comment: CRITICAL RESULT CALLED TO, READ BACK BY AND VERIFIED WITH: Vena Austria  AT 1504 09/24/17 BY L BENFIELD DELTA CHECK NOTED    Chloride 111 98 - 111 mmol/L   CO2 19 (L) 22 - 32 mmol/L   Glucose, Bld 73 70 - 99 mg/dL   BUN 8 8 - 23 mg/dL   Creatinine, Ser 0.53 0.44 - 1.00 mg/dL   Calcium 5.2 (LL) 8.9 - 10.3 mg/dL    Comment: CRITICAL RESULT CALLED TO, READ BACK BY AND VERIFIED WITH: Vena Austria AT 1504 09/24/17 BY L BENFIELD DELTA CHECK NOTED    GFR calc non Af Amer >60 >60 mL/min   GFR calc Af Amer >60 >60 mL/min    Comment: (NOTE) The eGFR has been calculated using the CKD EPI equation. This calculation has not been validated in all clinical situations. eGFR's persistently <60 mL/min signify possible Chronic Kidney Disease.    Anion gap 4 (L) 5 - 15    Comment: Performed at [facility] [address] [place], [place] [address]  Basic metabolic panel     Status: Abnormal   Collection Time: 09/24/17  3:22 PM  Result Value Ref Range   Sodium 125 (L) 135 - 145 mmol/L   Potassium 3.6 3.5 - 5.1 mmol/L   Chloride 85 (L) 98 - 111 mmol/L   CO2 27 22 - 32 mmol/L   Glucose, Bld 102 (H) 70 - 99 mg/dL   BUN 11 8 - 23 mg/dL   Creatinine, Ser 0.94 0.44 - 1.00 mg/dL   Calcium 9.1 8.9 - 10.3 mg/dL   GFR calc non Af Amer 55 (L) >60 mL/min   GFR calc Af Amer >60 >60 mL/min    Comment: (NOTE) The eGFR has been calculated using the CKD EPI equation. This calculation has not been validated in all clinical situations. eGFR's persistently <60 mL/min signify possible Chronic Kidney Disease.    Anion gap 13 5 - 15    Comment: Performed at [facility] [address] [place], [place] [address]  Basic metabolic panel     Status: Abnormal   Collection Time: 09/24/17  6:40 PM  Result Value Ref Range   Sodium 127 (L) 135 - 145 mmol/L   Potassium 3.7 3.5 - 5.1 mmol/L   Chloride 90 (L) 98 - 111 mmol/L   CO2 26 22 - 32 mmol/L   Glucose, Bld 113 (H) 70 - 99 mg/dL   BUN 11 8 - 23 mg/dL   Creatinine, Ser  0.87 0.44 - 1.00 mg/dL   Calcium 8.8 (L) 8.9 - 10.3  mg/dL   GFR calc non Af Amer 60 (L) >60 mL/min   GFR calc Af Amer >60 >60 mL/min    Comment: (NOTE) The eGFR has been calculated using the CKD EPI equation. This calculation has not been validated in all clinical situations. eGFR's persistently <60 mL/min signify possible Chronic Kidney Disease.    Anion gap 11 5 - 15    Comment: Performed at [facility] [address] [place], [place] [address]  Basic metabolic panel     Status: Abnormal   Collection Time: 09/24/17  9:57 PM  Result Value Ref Range   Sodium 128 (L) 135 - 145 mmol/L   Potassium 3.6 3.5 - 5.1 mmol/L   Chloride 89 (L) 98 - 111 mmol/L   CO2 27 22 - 32 mmol/L   Glucose, Bld 105 (H) 70 - 99 mg/dL   BUN 10 8 - 23 mg/dL   Creatinine, Ser 0.88 0.44 - 1.00 mg/dL   Calcium 9.0 8.9 - 10.3 mg/dL   GFR calc non Af Amer 59 (L) >60 mL/min   GFR calc Af Amer >60 >60 mL/min    Comment: (NOTE) The eGFR has been calculated using the CKD EPI equation. This calculation has not been validated in all clinical situations. eGFR's persistently <60 mL/min signify possible Chronic Kidney Disease.    Anion gap 12 5 - 15    Comment: Performed at [facility] [address] [place], [place] [address]  CBC     Status: Abnormal   Collection Time: 09/25/17  7:18 AM  Result Value Ref Range   WBC 9.9 4.0 - 10.5 K/uL   RBC 4.20 3.87 - 5.11 MIL/uL   Hemoglobin 11.4 (L) 12.0 - 15.0 g/dL   HCT 34.4 (L) 36.0 - 46.0 %   MCV 81.9 78.0 - 100.0 fL   MCH 27.1 26.0 - 34.0 pg   MCHC 33.1 30.0 - 36.0 g/dL   RDW 14.2 11.5 - 15.5 %   Platelets 213 150 - 400 K/uL    Comment: Performed at [facility], [place] [address] [place],  [address]    Dg Chest 2 View  Result Date: 09/23/2017 CLINICAL DATA:  Aspiration into airway EXAM: CHEST - 2 VIEW COMPARISON:  02/07/2017 FINDINGS: Cardiomegaly. Minimal left basilar density, likely atelectasis. Right lung clear. No effusions or edema. No acute bony abnormality. IMPRESSION:  Cardiomegaly.  Minimal left base atelectasis. Electronically Signed   By: Rolm Baptise M.D.   On: 09/23/2017 18:41   Ct Head Wo Contrast  Addendum Date: 09/24/2017   ADDENDUM REPORT: 09/24/2017 22:43 ADDENDUM: Critical Value/emergent results were called by telephone at the time of interpretation on 09/24/2017 at 10:40 pm to Dr. Sherry Ruffing, who verbally acknowledged these results. Electronically Signed   By: Elon Alas M.D.   On: 09/24/2017 22:43   Result Date: 09/24/2017 CLINICAL DATA:  Head trauma. Delayed recovery. Assess encephalopathy. History of hypertension, hyperlipidemia, intracranial hemorrhage. EXAM: CT HEAD WITHOUT CONTRAST TECHNIQUE: Contiguous axial images were obtained from the base of the skull through the vertex without intravenous contrast. COMPARISON:  MRI of the head September 23, 2017 comment CT HEAD March 02, 2017 and September 23, 2017. FINDINGS: BRAIN: No intraparenchymal hemorrhage, mass effect nor midline shift. Moderate to severe stable ventriculomegaly with small volume new dependent blood products bilateral occipital horns. No intraparenchymal hemorrhage, mass effect, midline shift or acute large vascular territory  infarcts. Small volume dense RIGHT frontal extra-axial blood products. Bilateral inferior frontal lobe subcentimeter hemorrhagic contusions. Large area LEFT frontal lobe encephalomalacia. Small areas RIGHT frontal and RIGHT temporal lobe encephalomalacia. Confluent supratentorial white matter hypodensities compatible with chronic small vessel ischemic changes. Basal cisterns are patent. VASCULAR: Mild to moderate calcific atherosclerosis of the carotid siphons. SKULL: Nondisplaced paramedian parietoccipital skull fracture extending to bifrontal calvarium. Severe temporomandibular osteoarthrosis. Old bilateral nasal bone fractures. Minimal residual LEFT parietal scalp hematoma. SINUSES/ORBITS: LEFT ethmoid mucosal retention cyst. Mastoid air cells are well aerated.The included  ocular globes and orbital contents are non-suspicious. Status post bilateral ocular lens implants. OTHER: None. IMPRESSION: 1. New small volume intraventricular hemorrhage without hydrocephalus. 2. Small volume RIGHT frontal extra-axial blood products with small inferior frontal lobe hemorrhagic contusions. 3. Nondisplaced acute skull fractures better demonstrated on today's CT. 4. LEFT frontal encephalomalacia at site of prior hemorrhage. RIGHT frontotemporal encephalomalacia suggestive of old TBI. Electronically Signed: By: Elon Alas M.D. On: 09/24/2017 22:31   Ct Cervical Spine Wo Contrast  Result Date: 09/23/2017 CLINICAL DATA:  Neck pain after fall. EXAM: CT CERVICAL SPINE WITHOUT CONTRAST TECHNIQUE: Multidetector CT imaging of the cervical spine was performed without intravenous contrast. Multiplanar CT image reconstructions were also generated. COMPARISON:  CT of the cervical spine 03/02/2017 FINDINGS: Alignment: Grade 1 anterolisthesis at C4-5 is stable. AP alignment is otherwise anatomic. Skull base and vertebrae: The craniocervical junction is normal. Vertebral body heights are normal. No acute or healing fractures are present. Soft tissues and spinal canal: Soft tissues the neck are unremarkable. Significant vascular disease is present. There is no significant adenopathy. Salivary glands are within normal limits. Disc levels: There is fusion across the disc space at C3-4, choir. Facet degenerative changes present at C2-3 and C3-4 with mild foraminal narrowing on the left at both levels. Chronic loss of disc height and uncovertebral disease contributes to mild foraminal narrowing bilaterally at C5-6 and C6-7, left greater than right. Upper chest: The lung apices are clear. IMPRESSION: 1. No acute abnormality. 2. Stable grade 1 anterolisthesis at C4-5. 3. Multilevel degenerative changes of the cervical spine are stable. Electronically Signed   By: San Morelle M.D.   On: 09/23/2017 17:27    Mr Brain Wo Contrast  Result Date: 09/23/2017 CLINICAL DATA:  Fall with head impact.  Possible syncopal event. EXAM: MRI HEAD WITHOUT CONTRAST TECHNIQUE: Multiplanar, multiecho pulse sequences of the brain and surrounding structures were obtained without intravenous contrast. COMPARISON:  Head CT 09/23/2017 Brain MRI 10/20/2016 FINDINGS: BRAIN: There is no acute infarct, acute hemorrhage or mass effect. The midline structures are normal. Old left frontal hemorrhagic site with associated encephalomalacia. Diffuse confluent hyperintense T2-weighted signal within the periventricular, deep and juxtacortical white matter, most commonly due to chronic ischemic microangiopathy. Advanced generalized atrophy. There is multifocal chronic microhemorrhage, numbering approximately 7-10, in a predominantly peripheral distribution. VASCULAR: Major intracranial arterial and venous sinus flow voids are preserved. SKULL AND UPPER CERVICAL SPINE: The visualized skull base, calvarium, upper cervical spine and extracranial soft tissues are normal. SINUSES/ORBITS: No fluid levels or advanced mucosal thickening. No mastoid or middle ear effusion. There are bilateral lens replacements. IMPRESSION: 1. No acute intracranial abnormality. 2. Encephalomalacia at the site of old left frontal hemorrhage with severe generalized atrophy and sequelae of chronic small vessel ischemia. 3. Scattered chronic microhemorrhage in a predominantly peripheral distribution, possibly indicating likely cerebral amyloid angiopathy or hypertensive angiopathy. Electronically Signed   By: Ulyses Jarred M.D.   On: 09/23/2017 22:23  Ct Head Code Stroke Wo Contrast  Result Date: 09/23/2017 CLINICAL DATA:  Code stroke. Altered level of consciousness, unexplained. New onset aphasia. EXAM: CT HEAD WITHOUT CONTRAST TECHNIQUE: Contiguous axial images were obtained from the base of the skull through the vertex without intravenous contrast. COMPARISON:  CT head  without contrast 03/02/2017 FINDINGS: Brain: Encephalomalacia of the anterior left frontal lobe is consistent with the area previous hemorrhage. Moderate atrophy and white matter disease is otherwise stable. No acute cortical infarct, hemorrhage, or mass lesion is present. Ventricles are proportionate to the degree of atrophy. No significant extra-axial fluid collection is present. Vascular: Atherosclerotic calcifications are present in the cavernous internal carotid arteries bilaterally. There is no hyperdense vessel. Skull: Calvarium is intact. There is a focal area of soft tissue swelling along the posterior scalp without underlying fracture. Sinuses/Orbits: The paranasal sinuses and mastoid air cells are clear. Bilateral lens replacements are present. Globes and orbits are within normal limits. ASPECTS [facility] Stroke Program Early CT Score) - Ganglionic level infarction (caudate, lentiform nuclei, internal capsule, insula, M1-M3 cortex): 7/7 - Supraganglionic infarction (M4-M6 cortex): 3/3 Total score (0-10 with 10 being normal): 10/10 IMPRESSION: 1. No acute intracranial abnormality. 2. Encephalomalacia of the anterior left frontal lobe is as expected following previous hemorrhage. 3. Otherwise stable moderate atrophy and white matter disease. 4. Soft tissue swelling along the occipital scalp. Question trauma. There is no underlying fracture. 5. ASPECTS is 10/10 The above was relayed via text pager to Dr. Lorraine Lax on 09/23/2017 at 17:20 . Electronically Signed   By: San Morelle M.D.   On: 09/23/2017 17:22    Review of Systems  Unable to perform ROS: Mental acuity   Blood pressure (!) 110/58, pulse 71, temperature 99 F (37.2 C), temperature source Axillary, resp. rate 17, height '5\' 2"'$  (1.575 m), weight 78.3 kg (172 lb 9.9 oz), SpO2 94 %. Physical Exam  Neurological: She is alert. She has normal strength. GCS eye subscore is 4. GCS verbal subscore is 5. GCS motor subscore is 6.  Patient is awake  and aphasic nonvocal nonverbal pupils equal moves all extremities well    Assessment/Plan: 84 year old female with global aphasia moves all extremities no significant neurosurgical issue here very small amount of intervertebral hemorrhage unclear etiology of aphasia recommend neurology consult possible EEG however consideration should be given the fact this is a lady with end-stage dementia residing in a nursing hometherefore additional treatment may not be warranted or just empirical treatment for seizures.  Aliha Diedrich P 09/25/2017, 8:00 AM

## 2017-09-25 NOTE — Progress Notes (Signed)
Patient off floor to radiology.Meds will be given when she gets back.

## 2017-09-25 NOTE — Progress Notes (Signed)
  Speech Language Pathology Treatment: Dysphagia  Patient Details Name: Ebony [name] MRN: [id number] DOB: [birth year] Today's Date: 09/25/2017 Time: [id number] SLP Time Calculation (min) (ACUTE ONLY): 25 min  Assessment / Plan / Recommendation Clinical Impression  Patient seen to address dysphagia goals with trials of puree solids and thin liquids. Patient would only accept very small bites of puree solids from spoon and small sips of thin liquids from cup. Swallow initiation delay is suspected per palpation, however patient did not exhibit any overt s/s of penetration or aspiration. Patient able to hold cup and self-feed with liquids, but does not initiate without cues.  Concern for nutritional intake as patient likely will not consume enough PO's.    HPI HPI: Pt is an 84 year old female with a PMH of hypertension, atrial fibrillation (not on anticoagulation), HLD, hypothyroidism, GERD, CVA, subarachnoid hemorrhage (01/2017 of left frontal lobe), and dementia (MOCA 2018 with score 9), presenting for a code stroke after an unwitnessed fall, after which she was aphasic and unresponsive to staff. MRI negative for acute infarct. Per RN note in the ED, family denies a h/o dysphagia. Prior BSEs (three in 2018, one in 2014) all recommended regular or Dys 3 diet, thin liquids. Prior SLEs describe expressive aphasia at baseline although still communicative.      SLP Plan  Continue with current plan of care       Recommendations  Diet recommendations: Dysphagia 1 (puree);Thin liquid Liquids provided via: Cup Medication Administration: Crushed with puree Supervision: Full supervision/cueing for compensatory strategies;Trained caregiver to feed patient Compensations: Minimize environmental distractions Postural Changes and/or Swallow Maneuvers: Seated upright 90 degrees                Oral Care Recommendations: Oral care QID Follow up Recommendations: Skilled Nursing facility SLP Visit  Diagnosis: Dysphagia, unspecified (R13.10) Plan: Continue with current plan of care       GO                Angela NevinJohn T. Colin Norment, MA, CCC-SLP 09/25/17 5:38 PM

## 2017-09-25 NOTE — Progress Notes (Signed)
Internal Medicine Attending:   I saw and examined the patient. I reviewed the resident's note and I agree with the resident's findings and plan as documented in the resident's note.  Patient is more alert and awake this morning.  She does follow commands but remains aphasic.  On repeat CT head she was noted to have a nondisplaced acute skull fracture as well as new small intraventricular hemorrhage.  Neurosurgery follow-up and recommendations appreciated.  No neurosurgical intervention at this time.  We will follow-up neurology consult.  We will continue to monitor neurochecks every 4 hours.  Would hold aspirin for now.  Of note, patient also was hyponatremic on admission and his sodium is slowly improved to 128.  We will continue to monitor her sodium closely.

## 2017-09-26 LAB — BASIC METABOLIC PANEL
ANION GAP: 10 (ref 5–15)
BUN: 13 mg/dL (ref 8–23)
CHLORIDE: 91 mmol/L — AB (ref 98–111)
CO2: 28 mmol/L (ref 22–32)
Calcium: 9 mg/dL (ref 8.9–10.3)
Creatinine, Ser: 0.91 mg/dL (ref 0.44–1.00)
GFR calc Af Amer: >60 mL/min (ref >60)
GFR calc non Af Amer: 57 mL/min — ABNORMAL LOW (ref >60)
GLUCOSE: 93 mg/dL (ref 70–99)
POTASSIUM: 3.4 mmol/L — AB (ref 3.5–5.1)
Sodium: 129 mmol/L — ABNORMAL LOW (ref 135–145)

## 2017-09-26 LAB — CBC
HEMATOCRIT: 33.7 % — AB (ref 36.0–46.0)
HEMOGLOBIN: 11.1 g/dL — AB (ref 12.0–15.0)
MCH: 27.3 pg (ref 26.0–34.0)
MCHC: 32.9 g/dL (ref 30.0–36.0)
MCV: 83 fL (ref 78.0–100.0)
Platelets: 211 10*3/uL (ref 150–400)
RBC: 4.06 MIL/uL (ref 3.87–5.11)
RDW: 14.3 % (ref 11.5–15.5)
WBC: 10.7 10*3/uL — AB (ref 4.0–10.5)

## 2017-09-26 MED ORDER — LEVOTHYROXINE SODIUM 25 MCG PO TABS
25.0000 ug | ORAL_TABLET | Freq: Every day | ORAL | Status: DC
  Administered 2017-09-26 – 2017-09-29 (×4): 25 ug via ORAL
  Filled 2017-09-26 (×4): qty 1

## 2017-09-26 MED ORDER — LABETALOL HCL 5 MG/ML IV SOLN
5.0000 mg | Freq: Four times a day (QID) | INTRAVENOUS | Status: DC | PRN
  Administered 2017-09-27: 5 mg via INTRAVENOUS
  Filled 2017-09-26: qty 4

## 2017-09-26 MED ORDER — LISINOPRIL 20 MG PO TABS
40.0000 mg | ORAL_TABLET | Freq: Every day | ORAL | Status: DC
  Administered 2017-09-26 – 2017-09-29 (×4): 40 mg via ORAL
  Filled 2017-09-26 (×4): qty 2

## 2017-09-26 MED ORDER — ACETAMINOPHEN 80 MG PO CHEW: 500.0000 mg | CHEWABLE_TABLET | Freq: Four times a day (QID) | ORAL | Status: DC | PRN

## 2017-09-26 MED ORDER — METOPROLOL TARTRATE 25 MG PO TABS
25.0000 mg | ORAL_TABLET | Freq: Two times a day (BID) | ORAL | Status: DC
  Administered 2017-09-26 – 2017-09-29 (×7): 25 mg via ORAL
  Filled 2017-09-26 (×7): qty 1

## 2017-09-26 MED ORDER — ACETAMINOPHEN 325 MG PO TABS: 650.0000 mg | ORAL_TABLET | Freq: Four times a day (QID) | ORAL | Status: DC | PRN

## 2017-09-26 NOTE — Progress Notes (Signed)
NEUROSURGERY PROGRESS NOTE  Improving since yesterday, able to follow some commands and answer simple questions. No new nsgy recom.  Temp:  [98 F (36.7 C)-98.5 F (36.9 C)] 98 F (36.7 C) (07/27 1522) Pulse Rate:  [66-87] 77 (07/28 0729) Resp:  [13-18] 18 (07/28 0330) BP: (118-184)/(73-100) 171/88 (07/28 0729) SpO2:  [91 %-100 %] 97 % (07/28 0729)  Ebony MangesKimberly Hannah Leasha Goldberger, NP 09/26/2017 8:46 AM

## 2017-09-26 NOTE — Progress Notes (Signed)
STROKE TEAM PROGRESS NOTE   HISTORY OF PRESENT ILLNESS (per record) RYLEN HOU is an 84 y.o. female with a past medical history of hypertension, A. fib not on anticoagulation due to previous intracranial hemorrhage, dyslipidemia, hypothyroidism, depression, prior cerebrovascular accident, history of subarachnoid hemorrhage (last on 02/09/2017 - left frontal lobe hematoma 29cc), and subdural hematomavascular dementia and prior records frequent falls, intermittent aphasia and right sided weakness who presents from [facility] with syncope with  aphasia as a CODE STROKE. Per EMS and reports from [facility].  Around 3:30 PM, patient was awake and alert playing bingo with the residents of the facility.  Around 4 PM she was found on the, fell out of her wheelchair, unclear if it is a mechanical or syncopal episode.  Patient was noted to have small hematoma in the back of her head.  Was concerning as the patient was aphasic and not responsive to staff.  Per EMS she was awake and alert but aphasic.  On admission to the hospital patient symptoms were minimally improving and she was more reactive with staff, speaks a few words, with intact naming. In the CT machine patient started vomiting copiously-was reported that patient vomited at her facility.  Patient minimally follows commands with concentration and does move her extremities with increased direction.  Neuro was consulted for further evaluation and patient will be admitted.  Patient with altered mental status and unable to accurately provide review of systems or history of present illness.  Of note MOCA in 2018 score 9 indicating severe cognitive dysfunction at baseline  LSN:09/23/17 3:30pm  tPA Given: No: Patient with previously poor functional status, previous ICH and improving symptoms NIHSS: 4     SUBJECTIVE (INTERVAL HISTORY): No complaints.    EEG normal in sleep only.  CT left frontal encephalomalacia from prior left ICH;  right frontal subdural hematoma/frontal contusion from recent fall out of wheelchair; bilateral IVH in small amount in the occipital horns also likely secondary to trauma.      OBJECTIVE Temp:  [97.6 F (36.4 C)-98.9 F (37.2 C)] 97.6 F (36.4 C) (07/28 1130) Pulse Rate:  [66-87] 82 (07/28 1130) Cardiac Rhythm: Normal sinus rhythm;Heart block (07/28 0700) Resp:  [13-24] 24 (07/28 1130) BP: (118-172)/(73-100) 160/80 (07/28 1130) SpO2:  [91 %-100 %] 98 % (07/28 1130)  CBC:  Recent Labs  Lab 09/23/17 1656  09/25/17 0718 09/26/17 0619  WBC 10.3  --  9.9 10.7*  NEUTROABS 6.7  --   --   --   HGB 11.6*   < > 11.4* 11.1*  HCT 34.3*   < > 34.4* 33.7*  MCV 80.9  --  81.9 83.0  PLT 253  --  213 211   < > = values in this interval not displayed.    Basic Metabolic Panel:  Recent Labs  Lab 09/23/17 2219  09/25/17 0718 09/26/17 0619  NA 127*   < > 128* 129*  K 3.5   < > 3.5 3.4*  CL 89*   < > 91* 91*  CO2 26   < > 27 28  GLUCOSE 131*   < > 98 93  BUN 11   < > 9 13  CREATININE 0.89   < > 0.89 0.91  CALCIUM 8.9   < > 8.9 9.0  PHOS 2.9  --   --   --    < > = values in this interval not displayed.    Lipid Panel:     Component  Value Date/Time   CHOL 133 09/24/2017 0024   TRIG 52 09/24/2017 0024   HDL 67 09/24/2017 0024   CHOLHDL 2.0 09/24/2017 0024   VLDL 10 09/24/2017 0024   LDLCALC 56 09/24/2017 0024   HgbA1c:  Lab Results  Component Value Date   HGBA1C 5.5 09/24/2017   Urine Drug Screen:     Component Value Date/Time   LABOPIA NONE DETECTED 09/23/2017 1950   COCAINSCRNUR NONE DETECTED 09/23/2017 1950   LABBENZ NONE DETECTED 09/23/2017 1950   AMPHETMU NONE DETECTED 09/23/2017 1950   THCU NONE DETECTED 09/23/2017 1950   LABBARB NONE DETECTED 09/23/2017 1950    Alcohol Level     Component Value Date/Time   ETH <10 09/23/2017 1656    IMAGING   Ct Head Wo Contrast 09/24/2017   IMPRESSION:  1. New small volume intraventricular hemorrhage without  hydrocephalus.  2. Small volume RIGHT frontal extra-axial blood products with small inferior frontal lobe hemorrhagic contusions.  3. Nondisplaced acute skull fractures better demonstrated on today's CT.  4. LEFT frontal encephalomalacia at site of prior hemorrhage. RIGHT frontotemporal encephalomalacia suggestive of old TBI.    Mr [name] Contrast  09/25/2017 IMPRESSION:  1. Limited postcontrast 3 sequence MRI of the head. Diffuse mild dural enhancement consistent with prior subdural hematomas/head trauma. No suspicious intraparenchymal enhancement.  2. Evolving acute inferior frontal lobe hemorrhagic contusions with small volume intraventricular hemorrhage and extra-axial blood products. No hydrocephalus.  3. Old LEFT frontal hemorrhage. Bifrontal and RIGHT temporal encephalomalacia compatible with old TBI.       PHYSICAL EXAM Vitals:   09/26/17 0330 09/26/17 0729 09/26/17 0730 09/26/17 1130  BP: (!) 164/100 (!) 171/88 (!) 171/88 (!) 160/80  Pulse: 80 77 79 82  Resp: 18  20 (!) 24  Temp:   98.9 F (37.2 C) 97.6 F (36.4 C)  TempSrc:   Oral Oral  SpO2: 91% 97% 96% 98%  Weight:      Height:        Awake, alert. Disoriented to year, date, day,place, president, city, state. Oriented to July. Non-fluent. Will not follow most commands.  Naming - intact. Repetition- impaired.   Symmetrical movements of all extremities.       ASSESSMENT/PLAN Ms. Kathyrn Sheriffancy C Leitzel is a 84 y.o. female with history of hypothyroidism, previous ischemic stroke, previous subarachnoid hemorrhage, previous subdural hematoma, atrial fibrillation not on anticoagulation, impaired fasting glucose, hypertension, hyperlipidemia, dementia, depression, and anxiety presenting with altered mental status and a fascia. She did not receive IV t-PA due to hemorrhage.  New small volume intraventricular hemorrhage   Resultant  Aphasia.  CT head - New small volume intraventricular hemorrhage  MRI head - Evolving  acute inferior frontal lobe hemorrhagic contusions with small volume intraventricular hemorrhage and extra-axial blood products  MRA head - not performed  VTE prophylaxis - SCDs Diet Order           DIET - DYS 1 Room service appropriate? Yes; Fluid consistency: Thin  Diet effective now          No antithrombotic prior to admission, now on No antithrombotic  Ongoing aggressive stroke risk factor management  Therapy recommendations:  pending  Disposition:  Pending  Hypertension  SBP a little high. . Long-term BP goal normotensive   Other Stroke Risk Factors  Advanced age  Obesity, Body mass index is 31.57 kg/m., recommend weight loss, diet and exercise as appropriate   Hx of SAH, SDH,  and ischemic stroke  Afib not anticoagulated.  Other Active Problems  Mild anemia  Mild hypokalemia  Dementia  Elevated BP  Skull fractures   Hospital day # 3  Severe dementia - ?Alzheimer's vs Frontotemporal primary progressive aphasia.    Post-traumatic SDH/contusion - small.  Expect slow and full resorption.    Left frontal encephalomalacia - puts her at risk of focal onset seizures with or without secondary generalization.  EEG was limited to sleep.  Will repeat now that she is more awake and alert.  Her fall out of wheelchair may have been unrecognized seizure.  Consideration can be given to empiric AED initiation even if EEG repeat does not show any epileptiforms.    Weston Settle, MS, MD  To contact Stroke Continuity provider, please refer to [email or web address]. After hours, contact General Neurology

## 2017-09-26 NOTE — Progress Notes (Signed)
   Subjective:  Patient evaluated at bedside this morning. She opens eyes to voice; shakes head "no" to being in pain; able to wiggle her toes and move her feet by command, but will not move upper extremities to command (though does move them freely).  Objective:  Vital signs in last 24 hours: Vitals:   09/26/17 0330 09/26/17 0729 09/26/17 0730 09/26/17 1130  BP: (!) 164/100 (!) 171/88 (!) 171/88 (!) 160/80  Pulse: 80 77 79 82  Resp: 18  20 (!) 24  Temp:   98.9 F (37.2 C) 97.6 F (36.4 C)  TempSrc:   Oral Oral  SpO2: 91% 97% 96% 98%  Weight:      Height:       Constitutional: NAD CV: irregularly irregular Resp: no increased work of breathing Abd: soft, ND Neuro: moves all 4 extremities freely; able to follow some commands; no facial assymmetry; aphasia  Assessment/Plan:  Principal Problem:   Hyponatremia Active Problems:   Hypokalemia   Hypertensive urgency   Fall   Paroxysmal A-fib (HCC)   Hypothyroidism   Elevated troponin   QT prolongation  Fall: Uncertain etiology; EEG initially was negative but patient was sleeping. Per neuro, could have had silent seizure causing fall due to extensive damage to her brain from previous injuries and hemorrhages.  --repeat EEG; neuro likely to start antiepileptic after EEG anyway.  Intraventricular bleed, traumatic Aphasia Advanced dementia: Fall resulted in skull fracture and intraventricular bleed. Neurosurgery following and neuro following - no indication for surgery at this time. Imaging also showed evidence of contusion which may be playing a role in her current encephalopathy and aphasia.  --hold asa and pharmacological vte prophylaxis --speech recommends dysphagia 1 diet --neuro recommends SBP<160 --PT/OT recommend SNF once stable (back to her previous facility)  HTN: BP consistently elevated. Neuro recommends keeping <160 SBP. --restart home lisinopril and metoprolol --IV labetalol 5mg  prn  Hypotonic  hyponatremia: Stable and improving with fluid restriction. 129 today. --f/u am bmet  Hypothyroidism: Converted synthroid back to PO. --levothyroxine 25mg  daily  Dispo: Anticipated discharge in approximately 2-3 day(s).   Nyra MarketSvalina, Amoni Scallan, MD 09/26/2017, 4:48 PM IMTS - PGY3 Pager [phone]

## 2017-09-27 ENCOUNTER — Inpatient Hospital Stay (HOSPITAL_COMMUNITY): Payer: Medicare HMO

## 2017-09-27 DIAGNOSIS — R1310 Dysphagia, unspecified: Secondary | ICD-10-CM

## 2017-09-27 DIAGNOSIS — F329 Major depressive disorder, single episode, unspecified: Secondary | ICD-10-CM

## 2017-09-27 DIAGNOSIS — S0291XD Unspecified fracture of skull, subsequent encounter for fracture with routine healing: Secondary | ICD-10-CM

## 2017-09-27 DIAGNOSIS — E876 Hypokalemia: Secondary | ICD-10-CM

## 2017-09-27 DIAGNOSIS — I619 Nontraumatic intracerebral hemorrhage, unspecified: Secondary | ICD-10-CM

## 2017-09-27 DIAGNOSIS — S065X0D Traumatic subdural hemorrhage without loss of consciousness, subsequent encounter: Secondary | ICD-10-CM

## 2017-09-27 DIAGNOSIS — W19XXXD Unspecified fall, subsequent encounter: Secondary | ICD-10-CM

## 2017-09-27 DIAGNOSIS — I160 Hypertensive urgency: Secondary | ICD-10-CM

## 2017-09-27 DIAGNOSIS — I615 Nontraumatic intracerebral hemorrhage, intraventricular: Secondary | ICD-10-CM

## 2017-09-27 DIAGNOSIS — E871 Hypo-osmolality and hyponatremia: Secondary | ICD-10-CM

## 2017-09-27 DIAGNOSIS — S06369D Traumatic hemorrhage of cerebrum, unspecified, with loss of consciousness of unspecified duration, subsequent encounter: Secondary | ICD-10-CM

## 2017-09-27 DIAGNOSIS — I480 Paroxysmal atrial fibrillation: Secondary | ICD-10-CM

## 2017-09-27 DIAGNOSIS — F0390 Unspecified dementia without behavioral disturbance: Secondary | ICD-10-CM

## 2017-09-27 LAB — CBC
HCT: 34.1 % — ABNORMAL LOW (ref 36.0–46.0)
Hemoglobin: 11 g/dL — ABNORMAL LOW (ref 12.0–15.0)
MCH: 27.2 pg (ref 26.0–34.0)
MCHC: 32.3 g/dL (ref 30.0–36.0)
MCV: 84.2 fL (ref 78.0–100.0)
Platelets: 229 10*3/uL (ref 150–400)
RBC: 4.05 MIL/uL (ref 3.87–5.11)
RDW: 14.3 % (ref 11.5–15.5)
WBC: 10.5 10*3/uL (ref 4.0–10.5)

## 2017-09-27 LAB — BASIC METABOLIC PANEL WITH GFR
Anion gap: 11 (ref 5–15)
BUN: 18 mg/dL (ref 8–23)
CO2: 28 mmol/L (ref 22–32)
Calcium: 9 mg/dL (ref 8.9–10.3)
Chloride: 91 mmol/L — ABNORMAL LOW (ref 98–111)
Creatinine, Ser: 0.78 mg/dL (ref 0.44–1.00)
GFR calc Af Amer: >60 mL/min
GFR calc non Af Amer: >60 mL/min
Glucose, Bld: 108 mg/dL — ABNORMAL HIGH (ref 70–99)
Potassium: 3.1 mmol/L — ABNORMAL LOW (ref 3.5–5.1)
Sodium: 130 mmol/L — ABNORMAL LOW (ref 135–145)

## 2017-09-27 LAB — MAGNESIUM: Magnesium: 1.8 mg/dL (ref 1.7–2.4)

## 2017-09-27 MED ORDER — AMLODIPINE BESYLATE 10 MG PO TABS
10.0000 mg | ORAL_TABLET | Freq: Every day | ORAL | Status: DC
  Administered 2017-09-27: 10 mg via ORAL
  Filled 2017-09-27: qty 1

## 2017-09-27 MED ORDER — POTASSIUM CHLORIDE 20 MEQ PO PACK
40.0000 meq | PACK | Freq: Once | ORAL | Status: AC
  Administered 2017-09-27: 40 meq via ORAL
  Filled 2017-09-27: qty 2

## 2017-09-27 MED ORDER — ORAL CARE MOUTH RINSE
15.0000 mL | Freq: Two times a day (BID) | OROMUCOSAL | Status: DC
  Administered 2017-09-27 – 2017-09-29 (×3): 15 mL via OROMUCOSAL

## 2017-09-27 NOTE — Progress Notes (Signed)
Physical Therapy Treatment Patient Details Name: Ebony [name] MRN: [id number] DOB: 1931-05-09 Today's Date: 09/27/2017    History of Present Illness Patient is an 84 y/o female presenting with AMS, non-verbal, after an unwitnessed fall from her w/c. CT scan showed no acute intracranial abnormality, encephalomalacia in anterior left frontal lobe, soft tissue swelling in occipital lobe suspect trauma, no fracture. MRI brain with no acute intracranial abnormality.PMH significant for hypertension, paroxysmal atrial fibrillation (not on anticoagulation), HLD, hypothyroidism, CVA, GERD, subarachnoid hemorrhage (01/2017 of left frontal lobe), and dementia.    PT Comments    Pt with improved language and activity tolerance this session. Able to sit edge of bed x 10 minutes to participate in therex and self care activities.  Pt continues to need max A for bed mobility and max cuing for following directions.     Follow Up Recommendations  SNF;Supervision/Assistance - 24 hour     Equipment Recommendations       Recommendations for Other Services       Precautions / Restrictions Precautions Precautions: Fall Restrictions Weight Bearing Restrictions: No    Mobility  Bed Mobility   Bed Mobility: Supine to Sit;Sit to Supine     Supine to sit: Max assist Sit to supine: Max assist   General bed mobility comments: pt initiates movement with LEs, requires total A for trunk  Transfers                 General transfer comment: not attempted this session  Ambulation/Gait                 Stairs             Wheelchair Mobility    Modified Rankin (Stroke Patients Only)       Balance   Sitting-balance support: No upper extremity supported;Feet supported Sitting balance-Leahy Scale: Fair Sitting balance - Comments: pt performed sitting balance x 10 minutes with LE AAROM and pt performed washing face and arms while seated without LOB Postural control:  Posterior lean                                  Cognition Arousal/Alertness: Awake/alert Behavior During Therapy: Flat affect Overall Cognitive Status: No family/caregiver present to determine baseline cognitive functioning                                 General Comments: pt with improved language during session. greeted PT and answered yes/no questions verbally 50% of the time      Exercises General Exercises - Lower Extremity Ankle Circles/Pumps: AAROM;Both;20 reps Long Arc Quad: AAROM;20 reps;Both    General Comments        Pertinent Vitals/Pain Faces Pain Scale: No hurt    Home Living                      Prior Function            PT Goals (current goals can now be found in the care plan section) Progress towards PT goals: Progressing toward goals    Frequency    Min 2X/week      PT Plan Current plan remains appropriate    Co-evaluation              AM-PAC PT "6 Clicks" Daily Activity  Outcome Measure  Difficulty turning over  in bed (including adjusting bedclothes, sheets and blankets)?: A Lot Difficulty moving from lying on back to sitting on the side of the bed? : Unable Difficulty sitting down on and standing up from a chair with arms (e.g., wheelchair, bedside commode, etc,.)?: Unable Help needed moving to and from a bed to chair (including a wheelchair)?: Total Help needed walking in hospital room?: Total Help needed climbing 3-5 steps with a railing? : Total 6 Click Score: 7    End of Session   Activity Tolerance: Patient tolerated treatment well Patient left: in bed;with bed alarm set;with call bell/phone within reach Nurse Communication: Mobility status PT Visit Diagnosis: Unsteadiness on feet (R26.81);Other abnormalities of gait and mobility (R26.89);Muscle weakness (generalized) (M62.81);History of falling (Z91.81)     Time: [id number] PT Time Calculation (min) (ACUTE ONLY): 20 min  Charges:   $Therapeutic Activity: 8-22 mins                     Reggy EyeKaren Eshaal Duby, PT, DPT   Rogelio Waynick 09/27/2017, 1:57 PM

## 2017-09-27 NOTE — Care Management Important Message (Signed)
Important Message  Patient Details  Name: Ebony [name] MRN: [id number] Date of Birth: 08-07-1931   Medicare Important Message Given:  Yes    [name] 09/27/2017, 3:00 PM

## 2017-09-27 NOTE — Discharge Summary (Signed)
Name: Ebony [name] MRN: [id number] DOB: 24-Jun-1931 84 y.o. PCP: Malvin Johns  Date of Admission: 09/23/2017  4:54 PM Date of Discharge:  09/29/2017 Attending Physician: Burns Spain, MD  Discharge Diagnosis: 1. Unwitnessed fall --Traumatic intraventricular hemorrhage -- Post-traumatic subdural hematoma  -- Global aphasia - resolved -- Advanced Dementia 2. HTN 3. Hypotonic Hyponatremia  4. Hypothyroidism     Discharge Medications: Allergies as of 09/29/2017      Reactions   Sulfa Antibiotics Diarrhea   Biaxin [clarithromycin] Diarrhea   Penicillins Diarrhea   Has patient had a PCN reaction causing immediate rash, facial/tongue/throat swelling, SOB or lightheadedness with hypotension: Unknown Has patient had a PCN reaction causing severe rash involving mucus membranes or skin necrosis: Unknown Has patient had a PCN reaction that required hospitalization: Unknown Has patient had a PCN reaction occurring within the last 10 years: Unknown If all of the above answers are "NO", then may proceed with Cephalosporin use.   Klor-con [potassium Chloride] Other (See Comments)   Noted to be an allergy on the MAR   Ace Inhibitors    Cough with some Ace inhibitors.  Patient tolerates lisinopril and takes everyday.    Cozaar [losartan Potassium] Rash      Medication List    TAKE these medications   acetaminophen 500 MG tablet Commonly known as:  TYLENOL Take 1,000 mg by mouth every 8 (eight) hours as needed for moderate pain.   atorvastatin 20 MG tablet Commonly known as:  LIPITOR Take 1 tablet (20 mg total) by mouth daily at 6 PM.   Carboxymethylcellulose Sod PF 0.25 % Soln Place 1 drop into both eyes daily.   ENSURE CLEAR Liqd Take 1 each by mouth 2 (two) times daily between meals.   fluticasone 50 MCG/ACT nasal spray Commonly known as:  FLONASE Place 2 sprays into both nostrils daily.   hydrALAZINE 25 MG tablet Commonly known as:  APRESOLINE Take 25 mg by  mouth 4 (four) times daily. Hold for SBP < 120   hydrochlorothiazide 12.5 MG capsule Commonly known as:  MICROZIDE Take 12.5 mg by mouth daily.   levothyroxine 25 MCG tablet Commonly known as:  SYNTHROID, LEVOTHROID Take 1 tablet (25 mcg total) by mouth daily before breakfast.   lisinopril 40 MG tablet Commonly known as:  PRINIVIL,ZESTRIL Take 40 mg by mouth daily.   Magnesium Hydroxide 2400 MG/10ML Susp Take 7,200 mg by mouth daily as needed (for bowl movments; not to exceed 2 time a week).   Melatonin 3 MG Caps By mouth qhs for sleep What changed:    how much to take  how to take this  when to take this  additional instructions   metoprolol tartrate 25 MG tablet Commonly known as:  LOPRESSOR Take 0.5 tablets (12.5 mg total) by mouth every 6 (six) hours. What changed:    how much to take  when to take this   mirtazapine 15 MG tablet Commonly known as:  REMERON Take 15 mg by mouth at bedtime.   multivitamin with minerals Tabs tablet Take 1 tablet by mouth daily.   pantoprazole 20 MG tablet Commonly known as:  PROTONIX Take 20 mg by mouth every morning.   polyethylene glycol packet Commonly known as:  MIRALAX / GLYCOLAX Take 17 g by mouth daily.   saccharomyces boulardii 250 MG capsule Commonly known as:  FLORASTOR Take 250 mg by mouth 2 (two) times daily.   SYSTANE BALANCE 0.6 % Soln Generic drug:  Propylene Glycol Place  1 drop into both eyes every evening.   vitamin B-12 1000 MCG tablet Commonly known as:  CYANOCOBALAMIN Take 1,000 mcg by mouth daily.       Disposition and follow-up:   EbonyEbony [name] was discharged from [facility] in Fair condition.  At the hospital follow up visit please address:  1. Unwitnessed fall (Traumatic intraventricular hemorrhage; Post-traumatic subdural hematoma; Global aphasia - resolved; Advanced Dementia).  She has follow-up with Dr. Pearlean Brownie at [facility] in about 6  weeks.   2. 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  Labs / imaging needed at time of follow-up: Bmet  3.  Pending labs/ test needing follow-up: None, Echo   Follow-up Appointments:  Contact information for follow-up providers    Micki Riley, MD. Schedule an appointment as soon as possible for a visit in 6 week(s).   Specialties:  Neurology, Radiology Contact information: [address] [place] [address] [phone]            Contact information for after-discharge care    Destination    [facility] .   Service:  Skilled Nursing Contact information: [address] [place] [address] [phone]                  Hospital Course by problem list: 1. Unwitnessed fall (Traumatic intraventricular hemorrhage; Post-traumatic subdural hematoma; Global aphasia - resolved; Advanced Dementia) Ebony [name] is an 84 year old Caucasian female with past medical history significant for hypertension, atrial fibrillation currently not on anticoagulation, previous CVA, previous subarachnoid hemorrhage(on 02/09/2017),hyperlipidemia, hypothyroidism, depression, left temporal lobe hematoma, dementia, frequent falls, intermittent aphasia and right[facility]sided [facility]  On arrival she was aphasic and code stroke was called. She was hypertensive to 170s/130s, CT cervical spine reviewed [no acute abnormalities], CT head showed [ no acute intracranial abnormality, encephalomalacia of the anterior left frontal lobe (from previous hemorrhage)], stable moderate atrophy and white matter disease, soft tissue swelling along the occipital scalp, MRI brain showed [no acute intracranial abnormality, encephalomalacia, of left frontal hemorrhage with severe generalized atrophy and chronic small vessel disease, scattered chronic microhemorrhage possibly  indicating cerebral amyloid angiopathy or hypertensive angiopathy], repeat CT head showed [new small volume intraventricular hemorrhage without hydrocephalus, small volume right frontal extra-axial blood products with small inferior frontal lobe hemorrhagic contusion, nondisplaced acute skull fractures, left frontal encephalomalacia and right frontotemporal encephalomalacia suggestive of old TBI], repeat MRI brain showed [prior subdural hematoma/head trauma, evolving acute inferior frontal lobe hemorrhagic contusions with small volume intraventricular hemorrhage and extra-axial blood products, old left frontal hemorrhage, bifrontal and right temporal encephalomalacia compatible with old TBI].   During this admission, she remained aphasic from 09/23/17-09/26/17.  She was initially started on aspirin 325 mg daily which was later switched to 81 mg and discontinued after CT reviewed intraventricular hemorrhage.  We continued on Lipitor 20 mg and allow for permissive hypertension.  Initial EEG only showed normal drowsiness and sleep pattern with no evidence of seizure, however there was concern for possible silent seizures and a repeat EEG was ordered and showed no epileptiform activity.  Neurology recommended empiric antiepileptic drug initiation due to increased risk of seizure for patients with encephalomalacia but later it was found she was not a candidate given her dementia and history of Atrial fibrillation. Neurology recommended a repeat Echo due to concern of Cardiac Amyloid angiopathy.   On hospital day 4, her  dysphagia had resolved. She was verbal, able to carry a conversation and follow commands.  Her neurological exams had significantly improved and only noticeable deficit was 4/5 LLE stregth otherwise normal neuro findings. On subsequent hospital days, she returned to her normal baseline.  Neurosurgery was consult consulted once CT reviewed skull fracture recommended no surgical intervention.  2.  Hypotonic Hyponatremia  Ebony [name] was found to have hypotonic hyponatremia on arrival with initial serum creatinine of 122 with low serum osmolality of 252 and urine osmolality of 287.  This was thought to be secondary to SIADH due to recently started hydrochlorothiazide.  She received 120 cc of 3% hypertonic saline and was maintained on normal saline at 100 mL/hour.  Hyponatremia was appropriately corrected and sNa at discharge was 131.   3. Hypertension  Neurology allowed for permissive hypertension and was managed appropriately. She was maintained on IV Labetalol 5mg  q6 prn. She was also continued on home Lisinopril 40mg  qd, metoprolol 25mg  BID, Hydralazine 50mg  qid.  4. Hypothyroidism  She was continued on synthroid   Discharge Vitals:   BP (!) 152/67 (BP Location: Left Arm)   Pulse 79   Temp 97.6 F (36.4 C) (Oral)   Resp 18   Ht 5\' 2"  (1.575 m)   Wt 172 lb 9.9 oz (78.3 kg)   SpO2 98%   BMI 31.57 kg/m   Pertinent Labs, Studies, and Procedures:  CBC Latest Ref Rng & Units 09/28/2017 09/27/2017 09/26/2017  WBC 4.0 - 10.5 K/uL 10.6(H) 10.5 10.7(H)  Hemoglobin 12.0 - 15.0 g/dL 11.4(L) 11.0(L) 11.1(L)  Hematocrit 36.0 - 46.0 % 35.4(L) 34.1(L) 33.7(L)  Platelets 150 - 400 K/uL 234 229 211   BMP Latest Ref Rng & Units 09/29/2017 09/28/2017 09/27/2017  Glucose 70 - 99 mg/dL 604(V) 91 409(W)  BUN 8 - 23 mg/dL 11(B) 23 18  Creatinine 0.44 - 1.00 mg/dL 1.47 8.29 5.62  Sodium 135 - 145 mmol/L 131(L) 131(L) 130(L)  Potassium 3.5 - 5.1 mmol/L 4.1 4.4 3.1(L)  Chloride 98 - 111 mmol/L 94(L) 96(L) 91(L)  CO2 22 - 32 mmol/L 26 26 28   Calcium 8.9 - 10.3 mg/dL 9.1 9.3 9.0    Discharge Instructions: Discharge Instructions    Ambulatory referral to Neurology   Complete by:  As directed    Follow up with Dr. Pearlean Brownie at [facility] in 6-8 weeks. Too complicated for RN to follow. Thanks.   Call MD for:  difficulty breathing, headache or visual disturbances   Complete by:  As directed    Call MD  for:  extreme fatigue   Complete by:  As directed    Call MD for:  hives   Complete by:  As directed    Call MD for:  persistant dizziness or light-headedness   Complete by:  As directed    Call MD for:  persistant nausea and vomiting   Complete by:  As directed    Call MD for:  redness, tenderness, or signs of infection (pain, swelling, redness, odor or green/yellow discharge around incision site)   Complete by:  As directed    Call MD for:  severe uncontrolled pain   Complete by:  As directed    Call MD for:  temperature >100.4   Complete by:  As directed    Diet - low sodium heart healthy   Complete by:  As directed    Discharge instructions   Complete by:  As directed    Ebony [name],  It was a pleasure  taking care of you in the hospital.  You were seen because you fail at the skilled nursing facility.  You were found to have bleeding in your brain that is slowly getting better.  Your mental status has also improved.  Do not take any blood thinners.  Dr. Dortha SchwalbeAgyei   Increase activity slowly   Complete by:  As directed       Signed: Yvette RackAgyei, Obed K, MD 09/29/2017, 1:31 PM   Pager: [phone]

## 2017-09-27 NOTE — Progress Notes (Signed)
Gave pt PRN labetalol at 0517 for a BP of 170/88, rechecked at 0539 and BP 159/86, rechecked again at 0601 and BP 176/85. Notified Dr. Gwyneth RevelsKrienke of changes in BP and minimal effectiveness of Labetalol, given order to continue to monitor and call back if their are any major changes. Ebony [name]

## 2017-09-27 NOTE — Progress Notes (Signed)
  Date: 09/27/2017  Patient name: Ebony [name]  Medical record number: [id number]  Date of birth: [birth year]   I have seen and evaluated this patient and I have discussed the plan of care with the house staff. Please see their note for complete details. I concur with their findings with the following additions/corrections: Ebony [name] was seen on AM rounds with team. [facility] was alert and was eating lunch with assistance from her aide.  She is verbal, answers questions appropriately, and is able to follow commands but is not oriented.  This seems much closer to her baseline.  Neurology has ordered an EEG and an echo to complete her work-up.  Once neurology's work-up is complete, she will be stable to be transferred to SNF.  Burns SpainButcher, Zayanna Pundt A, MD 09/27/2017, 4:53 PM

## 2017-09-27 NOTE — Progress Notes (Signed)
   Subjective:  Overnight: Elevated Bps with range 150s-170s and reveiced IV Labetalol 5mg   Today, Ms. Davidow was examined with nurse tech at bedside.  She was alert, verbal and conversational.  Able to speak in full sentences and respond appropriately to questions.  Her only complaint was " little headache" at the back of her head but denies dizziness, nausea, vomiting.  She is also tolerating p.o. intake appropriately.  Objective:  Vital signs in last 24 hours: Vitals:   09/27/17 0402 09/27/17 0517 09/27/17 0539 09/27/17 0602  BP: (!) 162/76 (!) 170/88 (!) 159/86 (!) 176/85  Pulse: 66 69 66 63  Resp: (!) 21  (!) 21 18  Temp:      TempSrc:      SpO2: 96%  95% 94%  Weight:      Height:       Physical exams: Const: In no distress, verbal and conversational CV: Irregular rhythm Resp: Clear to auscultation Abd: BS+ Ext: No pitting edema Neuro: Alert to name only. CN II-XII intact with exception of 4/5 stregth at LLE  Assessment/Plan:  Principal Problem:   Hyponatremia Active Problems:   Hypokalemia   Hypertensive urgency   Fall   Paroxysmal A-fib [facility](HCC)   Hypothyroidism   Elevated troponin   QT prolongation  Ms. Testa is an 84 year old Caucasian female with past medical history significant for hypertension, atrial fibrillation currently not on anticoagulation, previous CVA, previous subarachnoid hemorrhage(on 02/09/2017),hyperlipidemia, hypothyroidism, depression, left temporal lobe hematoma, dementia, frequent falls, intermittent aphasia and right-sided weakness [facility] and found to hypotonic hyponatremia on arrival which has resolved.  Recently found to have new small intraventricular hemorrhage without hydrocephalus and nondisplaced acute skull fractures. There was concern for a silent seizure and a repeat EEG has been ordered.  Unwitnessed Fall Neurology was concerned for possible silent seizure and recommends  repeat EEG. There is also consideration for empiric AED initiation even if repeat EEG is negative due to increased risk of seizure for patients with encephalomalacia. -Pending EEG results -f/u Neuro reccs  Traumatic intraventricular hemorrhage Post-traumatic subdural hematoma  Global aphasia - resolved Advanced Dementia  Patient more alert, verbal and conversational. Neurological exams only positive for 4/5 LLE strength otherwise much improvement form previous assessment. Per neuro, she is to expect slow recovery  -Continue to hold ASA and VTE ppx -Maintain SBP<160 per neuro  -Displacement: SNF [facility] as recommended by PT/OT once stable  HTN  BP elevated in the last 12 hours with SBP in upper 150s-170s. Currently stable BP  -Continue home Lisinopril 40mg  qd, metoprolol 25mg  BID, Amlodipine 10mg  qd -Continue Labetalol 5mg  prn   Hypotonic Hyponatremia Stable. Currenlty sNA of 130 -Follow up AM Bmet  Hypokalemia K+ of 3.1 S/p 40mEq of Klor-con  Hypothyroidism  -Continue PO synthroid 25mcg    FEN: Dysphagia 1 diet, replace lytes as needed VTE ppx: SCDs Code status: DNR   Dispo: Anticipated discharge in approximately 1-2 day(s).   Yvette RackAgyei, Audrick Lamoureaux K, MD 09/27/2017, 7:14 AM Pager: [phone]

## 2017-09-27 NOTE — Progress Notes (Signed)
Routine EEG completed, results pending. 

## 2017-09-27 NOTE — Procedures (Signed)
ELECTROENCEPHALOGRAM REPORT   Patient: Ebony [name]       Room #: [id number] EEG No. ID: [id number] Age: 84 y.o.        Sex: female Referring Physician: Rogelia BogaButcher Report Date:  09/27/2017        Interpreting Physician: Thana FarrEYNOLDS, Yosselyn Tax  History: Ebony [name] is an 84 y.o. female with altered mental status and aphasia after a fall  Medications:  Norvasc, Flonase, Synthroid, Prinivil, Melatonin, Lopressor, Protonix  Conditions of Recording:  This is a 21 channel routine scalp EEG performed with bipolar and monopolar montages arranged in accordance to the international 10/20 system of electrode placement. One channel was dedicated to EKG recording.  The patient is in the awake and drowsy states.  Description:  The waking background activity consists of a low voltage, symmetrical, fairly well organized, 8-9 Hz alpha activity, seen from the parieto-occipital and posterior temporal regions.  Low voltage fast activity, poorly organized, is seen anteriorly and is at times superimposed on more posterior regions.  A mixture of theta and alpha rhythms are seen from the central and temporal regions. The patient drowses with slowing to irregular, low voltage theta and beta activity.  This slowing frequently becomes more pronounced with moderate voltage, fairly well organized delta activity noted that is of higher voltage over the right hemisphere.  No epileptiform activity is noted.    Stage II sleep is not obtained. Hyperventilation and intermittent photic stimulation were not performed.   IMPRESSION: This is an abnormal electroencephalogram secondary to asymmetric slowing of activity noted during drowse likely related to imaging abnormalities.  No epileptiform activity is noted.      Thana FarrLeslie Kenlynn Houde, MD Neurology [phone] 09/27/2017, 5:58 PM

## 2017-09-27 NOTE — Progress Notes (Signed)
Attempted echo, however patient is not in room. Will try again tomorrow as schedule permits.  09/27/2017 2:53 PM Gertie FeyMichelle Keeleigh Terris, BS, RVT, RDCS, RDMS

## 2017-09-27 NOTE — Care Management Note (Signed)
Case Management Note  Patient Details  Name: Ebony [name] MRN: [id number] Date of Birth: 1932/01/23  Subjective/Objective:       Pt admitted with hyponatremia. [facility]             Action/Plan: PT/OT recommending SNF. Plan [facility].   Expected Discharge Date:                  Expected Discharge Plan:  Skilled Nursing Facility  In-House Referral:  Clinical Social Work  Discharge planning Services     Post Acute Care Choice:    Choice offered to:     DME Arranged:    DME Agency:     HH Arranged:    HH Agency:     Status of Service:  In process, will continue to follow  If discussed at Long Length of Stay Meetings, dates discussed:    Additional Comments:  Kermit BaloKelli F Jhordan Mckibben, RN 09/27/2017, 11:58 AM

## 2017-09-27 NOTE — Progress Notes (Addendum)
STROKE TEAM PROGRESS NOTE   SUBJECTIVE (INTERVAL HISTORY): Patient RN at bedside.  Patient lying in bed, able to answer most of questions appropriately.  Moving all extremities symmetrically.  OBJECTIVE Temp:  [98.5 F (36.9 C)-98.6 F (37 C)] 98.6 F (37 C) (07/29 1214) Pulse Rate:  [55-82] 80 (07/29 1214) Cardiac Rhythm: Atrial fibrillation (07/29 0800) Resp:  [15-22] 15 (07/29 1214) BP: (131-176)/(73-91) 131/78 (07/29 1214) SpO2:  [94 %-99 %] 95 % (07/29 1214)  CBC:  Recent Labs  Lab 09/23/17 1656  09/26/17 0619 09/27/17 0715  WBC 10.3   < > 10.7* 10.5  NEUTROABS 6.7  --   --   --   HGB 11.6*   < > 11.1* 11.0*  HCT 34.3*   < > 33.7* 34.1*  MCV 80.9   < > 83.0 84.2  PLT 253   < > 211 229   < > = values in this interval not displayed.    Basic Metabolic Panel:  Recent Labs  Lab 09/23/17 2219  09/26/17 0619 09/27/17 0715  NA 127*   < > 129* 130*  K 3.5   < > 3.4* 3.1*  CL 89*   < > 91* 91*  CO2 26   < > 28 28  GLUCOSE 131*   < > 93 108*  BUN 11   < > 13 18  CREATININE 0.89   < > 0.91 0.78  CALCIUM 8.9   < > 9.0 9.0  MG  --   --   --  1.8  PHOS 2.9  --   --   --    < > = values in this interval not displayed.    Lipid Panel:     Component Value Date/Time   CHOL 133 09/24/2017 0024   TRIG 52 09/24/2017 0024   HDL 67 09/24/2017 0024   CHOLHDL 2.0 09/24/2017 0024   VLDL 10 09/24/2017 0024   LDLCALC 56 09/24/2017 0024   HgbA1c:  Lab Results  Component Value Date   HGBA1C 5.5 09/24/2017   Urine Drug Screen:     Component Value Date/Time   LABOPIA NONE DETECTED 09/23/2017 1950   COCAINSCRNUR NONE DETECTED 09/23/2017 1950   LABBENZ NONE DETECTED 09/23/2017 1950   AMPHETMU NONE DETECTED 09/23/2017 1950   THCU NONE DETECTED 09/23/2017 1950   LABBARB NONE DETECTED 09/23/2017 1950    Alcohol Level     Component Value Date/Time   ETH <10 09/23/2017 1656    IMAGING  Ct Head Wo Contrast 09/24/2017   IMPRESSION:  1. New small volume  intraventricular hemorrhage without hydrocephalus.  2. Small volume RIGHT frontal extra-axial blood products with small inferior frontal lobe hemorrhagic contusions.  3. Nondisplaced acute skull fractures better demonstrated on today's CT.  4. LEFT frontal encephalomalacia at site of prior hemorrhage. RIGHT frontotemporal encephalomalacia suggestive of old TBI.    Mr Ebony JeanBrain W Contrast  09/25/2017 IMPRESSION:  1. Limited postcontrast 3 sequence MRI of the head. Diffuse mild dural enhancement consistent with prior subdural hematomas/head trauma. No suspicious intraparenchymal enhancement.  2. Evolving acute inferior frontal lobe hemorrhagic contusions with small volume intraventricular hemorrhage and extra-axial blood products. No hydrocephalus.  3. Old LEFT frontal hemorrhage. Bifrontal and RIGHT temporal encephalomalacia compatible with old TBI.   Transthoracic Echocardiogram - pending  EEG pending   PHYSICAL EXAM Vitals:   09/27/17 0602 09/27/17 0723 09/27/17 0907 09/27/17 1214  BP: (!) 176/85 (!) 172/81 (!) 161/73 131/78  Pulse: 63 68  80  Resp: 18  18  15  Temp:  98.5 F (36.9 C)  98.6 F (37 C)  TempSrc:  Oral  Oral  SpO2: 94% 95%  95%  Weight:      Height:        Temp:  [98 F (36.7 C)-98.6 F (37 C)] 98 F (36.7 C) (07/29 1553) Pulse Rate:  [55-82] 78 (07/29 1912) Resp:  [12-22] 19 (07/29 1912) BP: (131-176)/(73-97) 175/77 (07/29 1912) SpO2:  [94 %-97 %] 96 % (07/29 1912)  General - fragile appearance and lethargic, well developed, in no apparent distress.  Ophthalmologic - fundi not visualized due to noncooperation.  Cardiovascular - Regular rate and rhythm.  Neuro -awake, alert, orientated to self, age, people, months, but not orientated to year.  Not able to backward spelling world.  Delayed recall 0/3.  Able to follow commands.  Paucity of speech.  Able to name and repeat.  However, psychomotor slowing.  PERRL, EOMI.  Visual field full.  Facial symmetrical.   Tongue midline.  Moving all extremities 3/5, symmetrically.  Sensation symmetrical bilaterally.  Coordination slow but no ataxia.  Gait not tested.   ASSESSMENT/Ebony Ebony is a 84 y.o. female with history of hypothyroidism, previous ischemic stroke, previous subarachnoid hemorrhage, previous subdural hematoma, atrial fibrillation not on anticoagulation, impaired fasting glucose, hypertension, hyperlipidemia, dementia, depression, and anxiety presenting with altered mental status and a fascia. She did not receive IV t-PA due to hemorrhage.  New small volume intraventricular hemorrhage   Resultant  Aphasia.  CT head - New small volume intraventricular hemorrhage  MRI head - Evolving acute inferior frontal lobe hemorrhagic contusions with small volume intraventricular hemorrhage and extra-axial blood products  MRA head - not performed  Echo - pending  VTE prophylaxis - SCDs  EEG repeat - pending Diet Order           DIET - DYS 1 Room service appropriate? Yes; Fluid consistency: Thin  Diet effective now          No antithrombotic prior to admission, now on No antithrombotic  Ongoing aggressive stroke risk factor management  Therapy recommendations:  SNF recommended  Disposition:  Pending  Hypertension  SBP a little high. . Long-term BP goal normotensive   Other Stroke Risk Factors  Advanced age  Obesity, Body mass index is 31.57 kg/m., recommend weight loss, diet and exercise as appropriate   Hx of SAH, SDH,  and ischemic stroke  Afib not anticoagulated.   Other Active Problems  Hypokalemia - 3.4 -> 3.1  Dementia  Hyponatremia - 129->130   Hospital day # 4  ATTENDING NOTE: I reviewed above note and agree with the assessment and Ebony. I have made any additions or clarifications directly to the above note. Pt was seen and examined.   84 year old female has history of hypertension, hyperlipidemia, traumatic SAH/SDH, dementia.  In 04/2012  she was admitted for unresponsiveness concerning for seizure activity.  EEG showed left temporal sharp wave.  CT showed acute subdural hematoma in the right anterior frontal and left anterior parafalcine regions.  MRI also confirmed subdural hematoma but also found small bilateral IVH. Patient also found to have proximal A. fib during 2014 admission.  In 05/2016 she was admitted for speech difficulty CT and CTA negative.  MRI and EEG also negative.  In 09/2016 patient had left facial droop not responding to questions.  EEG shows diffuse slowing but CT and MR showed bilateral IVH small.  MRV negative.  In 02/06/2017 patient admitted for altered  mental status, decreased responsiveness.  CT showed left frontal cortical large ICH with SAH, concerning for hemorrhagic infarct.  Deemed not a candidate for anticoagulation.  This time on 09/23/2017 patient admitted for syncope, with aphasia and not responding on work waking up and subsequent fall.  Vomited during CT.  EEG negative for seizure.  CT initially no acute abnormality but repeat CT second day showed again bilateral small IVH   Which were then confirmed on MRI.  MRI with contrast negative.  LDL 56 and A1c 5.5.  Repeat EEG pending.  TTE pending.  Combined all above findings together, really concerns for CAA with a cortical, and intraventricular hemorrhage as well as SAH/SDH.  Patient dementia could be due to vascular dementia versus Alzheimer's dementia associate with CAA.  Although patient had proximal A. fib found in 2014, she is not a good candidate for anticoagulation or antiplatelet given frequent ICH/IVH.   Her unresponsive episode could be due to seizure or CAA related focal transient neurological deficit.  However she is not a good candidate for AEDs given her dementia and possible A. fib.  If Keppra may worsen her mental status, and the Vimpat may worsen her cardiac condition.  Hold off AEDs at this time.  Continue BP control.  BP goal less than 140.   Will follow  Ebony Plan, MD PhD Stroke Neurology 09/27/2017 9:01 PM   I spent  35 minutes in total face-to-face time with the patient, more than 50% of which was spent in counseling and coordination of care, reviewing test results, images and medication, and discussing the diagnosis of frequent unresponsive episode, IVH, ICH, seizure activity, treatment Ebony and potential prognosis. This patient's care requiresreview of multiple databases, neurological assessment, discussion with family, other specialists and medical decision making of high complexity.         To contact Stroke Continuity provider, please refer to [email or web address]. After hours, contact General Neurology

## 2017-09-27 NOTE — Plan of Care (Signed)
  Problem: Health Behavior/Discharge Planning: Goal: Ability to manage health-related needs will improve Outcome: Progressing Note:  Patient alert and following commands, improvement from initial admission.    Problem: Nutrition: Goal: Adequate nutrition will be maintained Outcome: Progressing   Problem: Safety: Goal: Ability to remain free from injury will improve Outcome: Progressing   Problem: Skin Integrity: Goal: Risk for impaired skin integrity will decrease Outcome: Progressing

## 2017-09-27 NOTE — Progress Notes (Signed)
Update give to patient's daughter, Darl PikesSusan on patient's disposition today.

## 2017-09-27 NOTE — Progress Notes (Signed)
Given Verbal orders per Dr. Gwyneth RevelsKrienke to go give patients 1000 Lisinopril, Metoprolol, and Norvasc now, for increased BP. Jorge Nyimothy S Orphe[place]

## 2017-09-27 NOTE — Progress Notes (Signed)
SLP Cancellation Note  Patient Details Name: Joslyn Devonancy C Nath MRN: [id number] DOB: 27-Mar-1931   Cancelled treatment:       Reason Eval/Treat Not Completed: Patient at procedure or test/unavailable   Ziggy Reveles, Riley NearingBonnie Caroline 09/27/2017, 4:15 PM

## 2017-09-28 ENCOUNTER — Other Ambulatory Visit (HOSPITAL_COMMUNITY): Payer: Medicare HMO

## 2017-09-28 DIAGNOSIS — I680 Cerebral amyloid angiopathy: Secondary | ICD-10-CM

## 2017-09-28 LAB — CULTURE, BLOOD (ROUTINE X 2)
Culture: NO GROWTH
Culture: NO GROWTH

## 2017-09-28 LAB — CBC
HEMATOCRIT: 35.4 % — AB (ref 36.0–46.0)
Hemoglobin: 11.4 g/dL — ABNORMAL LOW (ref 12.0–15.0)
MCH: 27.2 pg (ref 26.0–34.0)
MCHC: 32.2 g/dL (ref 30.0–36.0)
MCV: 84.5 fL (ref 78.0–100.0)
Platelets: 234 10*3/uL (ref 150–400)
RBC: 4.19 MIL/uL (ref 3.87–5.11)
RDW: 14.5 % (ref 11.5–15.5)
WBC: 10.6 10*3/uL — ABNORMAL HIGH (ref 4.0–10.5)

## 2017-09-28 LAB — BASIC METABOLIC PANEL
Anion gap: 9 (ref 5–15)
BUN: 23 mg/dL (ref 8–23)
CO2: 26 mmol/L (ref 22–32)
Calcium: 9.3 mg/dL (ref 8.9–10.3)
Chloride: 96 mmol/L — ABNORMAL LOW (ref 98–111)
Creatinine, Ser: 0.81 mg/dL (ref 0.44–1.00)
GFR calc Af Amer: >60 mL/min (ref >60)
GFR calc non Af Amer: >60 mL/min (ref >60)
GLUCOSE: 91 mg/dL (ref 70–99)
Potassium: 4.4 mmol/L (ref 3.5–5.1)
Sodium: 131 mmol/L — ABNORMAL LOW (ref 135–145)

## 2017-09-28 MED ORDER — HYDRALAZINE HCL 50 MG PO TABS
50.0000 mg | ORAL_TABLET | Freq: Three times a day (TID) | ORAL | Status: DC
  Administered 2017-09-28 – 2017-09-29 (×3): 50 mg via ORAL
  Filled 2017-09-28 (×3): qty 1

## 2017-09-28 MED ORDER — HYDRALAZINE HCL 25 MG PO TABS
25.0000 mg | ORAL_TABLET | Freq: Four times a day (QID) | ORAL | Status: DC
  Administered 2017-09-28 (×2): 25 mg via ORAL
  Filled 2017-09-28 (×2): qty 1

## 2017-09-28 NOTE — Progress Notes (Signed)
   Subjective:   Overnight: No reports but had an EEG performed  Today, Ms. Danielski was examined at bedside. She appeared very pleasant as she was smiling as we walked in her room. She denies headaches, blurry vision, chest pain, abdominal pain. She reports of good appetite but had not had breakfast yet. She also reports that she feels her strength is back to baseline.   Objective:  Vital signs in last 24 hours: Vitals:   09/27/17 2312 09/28/17 0312 09/28/17 0730 09/28/17 0933  BP: (!) 147/63 (!) 142/59 (!) 151/59 (!) 159/83  Pulse: (!) 28 (!) 58 70 80  Resp: (!) 21 20 19    Temp: 98.2 F (36.8 C) 98.6 F (37 C) 98.3 F (36.8 C)   TempSrc: Oral Oral Axillary   SpO2: 95% 97% 97%   Weight:      Height:       Physical exam Const: Lying in bed comfortably CV: Regular rhythm  Resp: CTABL anteriorly  Abd: BS+, NTTP Neuro: Alert and oriented to place [employer]) but not time or person.   Assessment/Plan:  Principal Problem:   Hyponatremia Active Problems:   Hypokalemia   Hypertensive urgency   Fall   Paroxysmal A-fib [facility](HCC)   Hypothyroidism   Elevated troponin   QT prolongation   Intracerebral hemorrhage   Dementia without behavioral disturbance   Ms. Castell is an 84 year old Caucasian female with past medical history significant for hypertension, atrial fibrillation currently not on anticoagulation, previous CVA, previous subarachnoid hemorrhage(on 02/09/2017),hyperlipidemia, hypothyroidism, depression, left temporal lobe hematoma, dementia, frequent falls, intermittent aphasia and right[facility]sided weakness [facility] and found to hypotonic hyponatremia on arrivalwhich has resolved. Recently found to have new small intraventricular hemorrhage without hydrocephalus and nondisplaced acute skull fractures. Repeat EEG revealed no epileptiform activity. Neurology plans to HOLD off on AED due to patient's dementia and history of  A-Fibb  Unwitnessed fall, unclear etiology possible cerebral amyloid angiopathy EEG negative for epileptiform activity. No antiepileptic drugs at this time per neuro given patient's advanced dementia and history of paroxysmal A. Fib -Repeat echo pending   Traumatic intraventricular hemorrhage Post-traumatic subdural hematoma  Global aphasia - resolved Advanced Dementia  Continues to improve neurologically. Today only alert to place but not time or person -Hold ASA and VTE ppx -Diet advanced to dysphagia 3 -Displacement: SNF ([facility]) once stable   HTN -Maintain SBP<140 per neuro  -Continue home Lisinopril 40mg  qd, metoprolol 25mg  BID -D/C amlodipine and start PO hydralazine 25mg  QID -Continue Labetalol 5mg  prn   Hypotonic Hyponatremia Stable. Currenlty sNA of 131, which has improved since admission.  -Follow up AM Bmet  Hypothyroidism  -Continue PO synthroid 25mcg  Dispo: Anticipated discharge to SNF once neuro work-up is complete  Yvette RackAgyei, Stevie Charter K, MD 09/28/2017, 10:54 AM Pager: [phone]

## 2017-09-28 NOTE — Progress Notes (Signed)
STROKE TEAM PROGRESS NOTE   SUBJECTIVE (INTERVAL HISTORY): No family at bedside.  Patient lying in bed, awake, with eyes open, but not as interactive as yesterday. EEG showed no seizure activity. TTE pending.   OBJECTIVE Temp:  [98 F (36.7 C)-98.6 F (37 C)] 98.3 F (36.8 C) (07/30 1143) Pulse Rate:  [28-82] 66 (07/30 1143) Cardiac Rhythm: Heart block (07/30 0832) Resp:  [12-23] 23 (07/30 1143) BP: (133-175)/(59-97) 133/63 (07/30 1143) SpO2:  [95 %-97 %] 96 % (07/30 1143)  CBC:  Recent Labs  Lab 09/23/17 1656  09/27/17 0715 09/28/17 0430  WBC 10.3   < > 10.5 10.6*  NEUTROABS 6.7  --   --   --   HGB 11.6*   < > 11.0* 11.4*  HCT 34.3*   < > 34.1* 35.4*  MCV 80.9   < > 84.2 84.5  PLT 253   < > 229 234   < > = values in this interval not displayed.    Basic Metabolic Panel:  Recent Labs  Lab 09/23/17 2219  09/27/17 0715 09/28/17 0430  NA 127*   < > 130* 131*  K 3.5   < > 3.1* 4.4  CL 89*   < > 91* 96*  CO2 26   < > 28 26  GLUCOSE 131*   < > 108* 91  BUN 11   < > 18 23  CREATININE 0.89   < > 0.78 0.81  CALCIUM 8.9   < > 9.0 9.3  MG  --   --  1.8  --   PHOS 2.9  --   --   --    < > = values in this interval not displayed.    Lipid Panel:     Component Value Date/Time   CHOL 133 09/24/2017 0024   TRIG 52 09/24/2017 0024   HDL 67 09/24/2017 0024   CHOLHDL 2.0 09/24/2017 0024   VLDL 10 09/24/2017 0024   LDLCALC 56 09/24/2017 0024   HgbA1c:  Lab Results  Component Value Date   HGBA1C 5.5 09/24/2017   Urine Drug Screen:     Component Value Date/Time   LABOPIA NONE DETECTED 09/23/2017 1950   COCAINSCRNUR NONE DETECTED 09/23/2017 1950   LABBENZ NONE DETECTED 09/23/2017 1950   AMPHETMU NONE DETECTED 09/23/2017 1950   THCU NONE DETECTED 09/23/2017 1950   LABBARB NONE DETECTED 09/23/2017 1950    Alcohol Level     Component Value Date/Time   ETH <10 09/23/2017 1656    IMAGING  Ct Head Wo Contrast 09/24/2017   IMPRESSION:  1. New small volume  intraventricular hemorrhage without hydrocephalus.  2. Small volume RIGHT frontal extra-axial blood products with small inferior frontal lobe hemorrhagic contusions.  3. Nondisplaced acute skull fractures better demonstrated on today's CT.  4. LEFT frontal encephalomalacia at site of prior hemorrhage. RIGHT frontotemporal encephalomalacia suggestive of old TBI.   Mr Ebony [name] Contrast  09/25/2017 IMPRESSION:  1. Limited postcontrast 3 sequence MRI of the head. Diffuse mild dural enhancement consistent with prior subdural hematomas/head trauma. No suspicious intraparenchymal enhancement.  2. Evolving acute inferior frontal lobe hemorrhagic contusions with small volume intraventricular hemorrhage and extra-axial blood products. No hydrocephalus.  3. Old LEFT frontal hemorrhage. Bifrontal and RIGHT temporal encephalomalacia compatible with old TBI.   Transthoracic Echocardiogram - pending  EEG This is an abnormal electroencephalogram secondary to asymmetric slowing of activity noted during drowse likely related to imaging abnormalities.  No epileptiform activity is noted.  PHYSICAL EXAM Vitals:  09/28/17 0312 09/28/17 0730 09/28/17 0933 09/28/17 1143  BP: (!) 142/59 (!) 151/59 (!) 159/83 133/63  Pulse: (!) 58 70 80 66  Resp: 20 19  (!) 23  Temp: 98.6 F (37 C) 98.3 F (36.8 C)  98.3 F (36.8 C)  TempSrc: Oral Axillary  Axillary  SpO2: 97% 97%  96%  Weight:      Height:        Temp:  [98 F (36.7 C)-98.6 F (37 C)] 98.3 F (36.8 C) (07/30 1143) Pulse Rate:  [28-82] 66 (07/30 1143) Resp:  [12-23] 23 (07/30 1143) BP: (133-175)/(59-97) 133/63 (07/30 1143) SpO2:  [95 %-97 %] 96 % (07/30 1143)  General - fragile appearance and lethargic, well developed, in no apparent distress.  Ophthalmologic - fundi not visualized due to noncooperation.  Cardiovascular - Regular rate and rhythm.  Neuro - awake, orientated to self, age, people, months, but not orientated to year.  Not able to  backward spelling world.  Delayed recall 0/3.  Able to follow commands.  Paucity of speech.  Able to name and repeat.  However, psychomotor slowing.  PERRL, EOMI.  Visual field full.  Facial symmetrical.  Tongue midline.  Moving all extremities 3/5, symmetrically.  Sensation symmetrical bilaterally.  Coordination slow but no ataxia.  Gait not tested.   ASSESSMENT/PLAN [name] is a 84 y.o. female with history of hypothyroidism, previous ischemic stroke, previous subarachnoid hemorrhage, previous subdural hematoma, atrial fibrillation not on anticoagulation, hypertension, hyperlipidemia, dementia, depression, and anxiety presenting with syncope, with aphasia and not responding on work waking up and subsequent fall. She did not receive IV t-PA due to hemorrhage.  Recurrent small IVH - concerning for CAA given hx of cortical ICH, and recurrent IVH as well as SAH/SDH.   Resultant  Baseline dementia state  CT head initial - no acute abnormalities  CT head repeat - New small volume b/l IVH  MRI head - Evolving acute inferior frontal lobe hemorrhagic contusions with small volume intraventricular hemorrhage and extra-axial blood products  Echo - pending  VTE prophylaxis - SCDs  EEG x 2 - no seizure activity  LDL 56  A1C 5.5  No antithrombotic prior to admission, now on No antithrombotic. Not candidate for anticoagulation and antiplatelet.   Therapy recommendations:  SNF recommended  Disposition:  Pending  Hx of cerebral bleeding  04/2012 she was admitted for unresponsiveness concerning for seizure activity.  EEG showed a couple of left temporal sharp wave.  CT showed acute subdural hematoma in the right anterior frontal and left anterior parafalcine regions.  MRI also confirmed subdural hematoma but also found small bilateral IVH. Patient also found to have proximal A. fib during 2014 admission.  05/2016 she was admitted for speech difficulty CT and CTA negative.  MRI and EEG  also negative.  In 09/2016 patient had left facial droop not responding to questions.  EEG shows diffuse slowing but CT and MR showed bilateral IVH small.  MRV negative.  02/06/2017 patient admitted for altered mental status, decreased responsiveness.  CT showed left frontal cortical large ICH with SAH, concerning for hemorrhagic infarct.  Deemed not a candidate for anticoagulation.  ? seizure  EEG in 2014 a couple of left temporal sharp wave  Repeat EEG later did not show seizure activity  Her unresponsive episode could be due to seizure or CAA related focal transient neurological deficit.  However she is not a good candidate for AEDs given her dementia and possible A. fib.  If Keppra  may worsen her mental status, and the Vimpat may worsen her cardiac condition.  Hold off AEDs at this time.  PAF  Found in 2014 admission  Currently NSR on tele monitoring  Not candidate for anticoagulation due to CAA, recurrent IVH  Dementia  could be due to vascular dementia versus Alzheimer's dementia associate with CAA.  NH placement  Supportive care  Hypertension  On the high end  On home meds with hydralazine, lisinopril, metoprolol  Increase hydralazine dose  Strict BP control < 140 to avoid further bleeding  Other Stroke Risk Factors  Advanced age  Other Active Problems  Hypokalemia - 3.4 -> 3.1- supplement ->4.4  Hyponatremia - 129->130->131   Hospital day # 5  Neurology will sign off. Please call with questions. Pt will follow up with Dr. Pearlean Brownie at [facility] in about 6 weeks. Thanks for the consult.   Marvel Plan, MD PhD Stroke Neurology 09/28/2017 3:34 PM  To contact Stroke Continuity provider, please refer to [email or web address]. After hours, contact General Neurology

## 2017-09-28 NOTE — Progress Notes (Signed)
  Speech Language Pathology Treatment: Dysphagia  Patient Details Name: Ebony [name] MRN: [id number] DOB: 01/26/1932 Today's Date: 09/28/2017 Time: [id number] SLP Time Calculation (min) (ACUTE ONLY): 23 min  Assessment / Plan / Recommendation Clinical Impression  Pt was seen for skilled ST targeting dysphagia goals.  Per reports, pt appears clearer and improved from a neurological standpoint in comparison to previous ST sessions.  Pt was sitting upright in bed and would answer questions appropriately with a delay.  Pt had only eaten ~25% of breakfast upon arrival and reported that she'd "had enough;" however, when provided with strawberry yogurt, graham crackers, and hot tea pt initiated self feeding with increased time and ate ~25% of snack.  Pt's oral phase and swallow initiation appeared more automatic and timely today in comparison to previous reports.  She was able to clear purees and advanced solids from the oral cavity with increased time.  No overt s/s of aspiration were evident with solids or liquids.  As a result, recommend advancing pt's diet to dys 3 textures and thin liquids with full supervision for use of swallowing precautions.     HPI HPI: Pt is an 84 year old female with a PMH of hypertension, atrial fibrillation (not on anticoagulation), HLD, hypothyroidism, GERD, CVA, subarachnoid hemorrhage (01/2017 of left frontal lobe), and dementia (MOCA 2018 with score 9), presenting for a code stroke after an unwitnessed fall, after which she was aphasic and unresponsive to staff. MRI negative for acute infarct. Per RN note in the ED, family denies a h/o dysphagia. Prior BSEs (three in 2018, one in 2014) all recommended regular or Dys 3 diet, thin liquids. Prior SLEs describe expressive aphasia at baseline although still communicative.      SLP Plan  Continue with current plan of care       Recommendations  Diet recommendations: Dysphagia 3 (mechanical soft);Thin liquid Liquids  provided via: Cup Medication Administration: Crushed with puree Supervision: Full supervision/cueing for compensatory strategies Compensations: Minimize environmental distractions Postural Changes and/or Swallow Maneuvers: Seated upright 90 degrees                Oral Care Recommendations: Oral care BID Follow up Recommendations: Skilled Nursing facility SLP Visit Diagnosis: Dysphagia, unspecified (R13.10) Plan: Continue with current plan of care       GO                Matilynn Dacey, Melanee SpryNicole L 09/28/2017, 10:16 AM

## 2017-09-28 NOTE — Progress Notes (Signed)
NEUROSURGERY PROGRESS NOTE  Doing well. Able to answer questions appropriately and follow simple commands. Definitely improving since admission. No new nsgy recom.   Temp:  [98 F (36.7 C)-98.6 F (37 C)] 98.3 F (36.8 C) (07/30 0730) Pulse Rate:  [28-82] 70 (07/30 0730) Resp:  [12-21] 19 (07/30 0730) BP: (131-175)/(59-97) 151/59 (07/30 0730) SpO2:  [95 %-97 %] 97 % (07/30 0730)   Ebony MangesKimberly Hannah Kyo Cocuzza, NP 09/28/2017 8:29 AM

## 2017-09-28 NOTE — Progress Notes (Signed)
  Date: 09/28/2017  Patient name: Ebony [name]  Medical record number: [id number]  Date of birth: 1931-09-22   I have seen and evaluated this patient and I have discussed the plan of care with the house staff. Please see their note for complete details. I concur with their findings with the following additions/corrections: Ebony [name] was seen on AM rounds with team.  She was lying in bed and alert.  She has no complaints today.  She [facility], [facility] SNF, but then corrected herself to [facility].  She is not oriented to the year.  She has made significant improvement since admission.  Her sodium is now 131.  This is just slightly lower than her sodium level in April but it has been trending up during this admission with holding her HCTZ and fluid restriction, all consistent with a diagnosis of SIADH.  Neurosurgery has evaluated the patient today and commented on her improvement and have recommended nothing further.  Neurology is pursuing a TTE to assess for amyloid.  Her awake EEG showed no elipetiform activity.  Once neurology's work-up is completed, she will be stable to be transferred back to her SNF.  Burns SpainButcher, Skai Lickteig A, MD 09/28/2017, 11:39 AM

## 2017-09-29 ENCOUNTER — Inpatient Hospital Stay (HOSPITAL_COMMUNITY): Payer: Medicare HMO

## 2017-09-29 DIAGNOSIS — R278 Other lack of coordination: Secondary | ICD-10-CM | POA: Diagnosis not present

## 2017-09-29 DIAGNOSIS — R1312 Dysphagia, oropharyngeal phase: Secondary | ICD-10-CM | POA: Diagnosis not present

## 2017-09-29 DIAGNOSIS — R2681 Unsteadiness on feet: Secondary | ICD-10-CM | POA: Diagnosis not present

## 2017-09-29 DIAGNOSIS — I4891 Unspecified atrial fibrillation: Secondary | ICD-10-CM

## 2017-09-29 DIAGNOSIS — M6281 Muscle weakness (generalized): Secondary | ICD-10-CM | POA: Diagnosis not present

## 2017-09-29 DIAGNOSIS — R488 Other symbolic dysfunctions: Secondary | ICD-10-CM | POA: Diagnosis not present

## 2017-09-29 LAB — BASIC METABOLIC PANEL
ANION GAP: 11 (ref 5–15)
BUN: 27 mg/dL — ABNORMAL HIGH (ref 8–23)
CALCIUM: 9.1 mg/dL (ref 8.9–10.3)
CO2: 26 mmol/L (ref 22–32)
Chloride: 94 mmol/L — ABNORMAL LOW (ref 98–111)
Creatinine, Ser: 0.8 mg/dL (ref 0.44–1.00)
GLUCOSE: 101 mg/dL — AB (ref 70–99)
Potassium: 4.1 mmol/L (ref 3.5–5.1)
Sodium: 131 mmol/L — ABNORMAL LOW (ref 135–145)

## 2017-09-29 LAB — ECHOCARDIOGRAM COMPLETE
HEIGHTINCHES: 62 in
Weight: 2761.92 oz

## 2017-09-29 NOTE — Progress Notes (Signed)
Discharge to: [facility] Anticipated discharge date: 09/29/17 Family notified: Gus HeightYes, Susan, by phone Transportation by: PTAR  Report #: [id number], [facility]  CSW signing off.  Blenda Nicelylizabeth Tausha Milhoan LCSW [phone]

## 2017-09-29 NOTE — Progress Notes (Signed)
  Speech Language Pathology Treatment: Dysphagia;Cognitive-Linquistic  Patient Details Name: Ebony [name] MRN: [id number] DOB: 05-03-1931 Today's Date: 09/29/2017 Time: [id number] SLP Time Calculation (min) (ACUTE ONLY): 16 min  Assessment / Plan / Recommendation Clinical Impression  Pt was seen for skilled ST targeting goals for dysphagia and communication.  Pt was seated upright in bed upon therapist's arrival, awake, alert, but continues to have increased response latency and slowed task initiation.  Pt consumed a functional snack of dys 3 textures and thin liquids with grossly intact oropharyngeal swallowing function.  Pt had x1 delayed cough following trials of thin liquids via straw but it is unclear whether this is related to PO intake or not.  Otherwise pt had no other overt s/s of aspiration with any trials and current diet continues to be appropriate.  Pt could follow 1 step commands today with mod assist and increased time for slowed processing.  Pt needed max assist to verbally respond to therapist's questions at the word level.  Pt was oriented to month independently, to hospital with mod assist but needed total assist to reorient to year.  Pt requested a "swallow of water" prior to therapist's departure and smiled and nodded her head in response to social exchanges as therapist was leaving the room.  Pt was left in bed with bed alarm set and call bell within reach.     HPI HPI: Pt is an 84 year old female with a PMH of hypertension, atrial fibrillation (not on anticoagulation), HLD, hypothyroidism, GERD, CVA, subarachnoid hemorrhage (01/2017 of left frontal lobe), and dementia (MOCA 2018 with score 9), presenting for a code stroke after an unwitnessed fall, after which she was aphasic and unresponsive to staff. MRI negative for acute infarct. Per RN note in the ED, family denies a h/o dysphagia. Prior BSEs (three in 2018, one in 2014) all recommended regular or Dys 3 diet, thin liquids.  Prior SLEs describe expressive aphasia at baseline although still communicative.      SLP Plan  Continue with current plan of care       Recommendations  Diet recommendations: Dysphagia 3 (mechanical soft);Thin liquid Liquids provided via: Cup Medication Administration: Crushed with puree Supervision: Full supervision/cueing for compensatory strategies Compensations: Minimize environmental distractions Postural Changes and/or Swallow Maneuvers: Seated upright 90 degrees                Oral Care Recommendations: Oral care BID Follow up Recommendations: Skilled Nursing facility SLP Visit Diagnosis: Dysphagia, unspecified (R13.10);Aphasia (R47.01) Plan: Continue with current plan of care       GO                Fredonia Casalino, Melanee Spryicole L 09/29/2017, 2:45 PM

## 2017-09-29 NOTE — Progress Notes (Signed)
  Echocardiogram 2D Echocardiogram has been performed.  Tye SavoyCasey N Junella Domke 09/29/2017, 11:08 AM

## 2017-09-29 NOTE — Plan of Care (Signed)
Remains max assist

## 2017-09-29 NOTE — Progress Notes (Signed)
Physical Therapy Treatment Patient Details Name: Ebony [name] MRN: [id number] DOB: 17-Mar-1931 Today's Date: 09/29/2017    History of Present Illness Patient is an 84 y/o female presenting with AMS, non-verbal, after an unwitnessed fall from her w/c. CT scan showed no acute intracranial abnormality, encephalomalacia in anterior left frontal lobe, soft tissue swelling in occipital lobe suspect trauma, no fracture. MRI brain with no acute intracranial abnormality.PMH significant for hypertension, paroxysmal atrial fibrillation (not on anticoagulation), HLD, hypothyroidism, CVA, GERD, subarachnoid hemorrhage (01/2017 of left frontal lobe), and dementia.    PT Comments    Pt continues to demonstrate cognitive deficits limiting ability to participate with therapy. She follows commands 50% of the time and is mostly non-verbal throughout session.  Required max A +2 for bed mobilities and assist for sitting balance. Transfer not attempted at this time. She is expected to transfer back to SNF this PM.    Follow Up Recommendations  SNF;Supervision/Assistance - 24 hour     Equipment Recommendations  (TBD)    Recommendations for Other Services OT consult     Precautions / Restrictions Precautions Precautions: Fall Restrictions Weight Bearing Restrictions: No    Mobility  Bed Mobility Overal bed mobility: Needs Assistance Bed Mobility: Supine to Sit;Sit to Supine     Supine to sit: Max assist Sit to supine: Max assist   General bed mobility comments: able to initiate movement on LEs, however required max assist for bed mobilities secondary to weakness and decreased cognition.   Transfers                 General transfer comment: unable to attempt  Ambulation/Gait                 Stairs             Wheelchair Mobility    Modified Rankin (Stroke Patients Only)       Balance Overall balance assessment: Needs assistance Sitting-balance support: No  upper extremity supported;Feet supported Sitting balance-Leahy Scale: Fair Sitting balance - Comments: Pt sat EOB for ~12 min. Left and posterior lean. Pt able to intermittantly support her self, though primarily required min-mod A to maintain sitting balance. Cues to look up and reaching activity attempted to improve balance. Postural control: Posterior lean;Left lateral lean                                  Cognition Arousal/Alertness: Awake/alert Behavior During Therapy: Flat affect Overall Cognitive Status: No family/caregiver present to determine baseline cognitive functioning                                 General Comments: Pt answered verbally 2x during session to orientation questions. Able to state her name, but otherwise disorientated to time/place/situation. Able to nod on occasion to yes or no questions. Follows commands about 50% of the time.       Exercises      General Comments        Pertinent Vitals/Pain Pain Assessment: Faces Faces Pain Scale: Hurts little more Pain Location: Pt unable to state, however cried out with L knee flexion. Pain Intervention(s): Monitored during session;Limited activity within patient's tolerance    Home Living                      Prior Function  PT Goals (current goals can now be found in the care plan section) Acute Rehab PT Goals Patient Stated Goal: none stated PT Goal Formulation: With patient Time For Goal Achievement: 10/08/17 Potential to Achieve Goals: Fair Progress towards PT goals: Not progressing toward goals - comment(limited by lethargy and cognition)    Frequency    Min 2X/week      PT Plan Current plan remains appropriate    Co-evaluation              AM-PAC PT "6 Clicks" Daily Activity  Outcome Measure  Difficulty turning over in bed (including adjusting bedclothes, sheets and blankets)?: Unable Difficulty moving from lying on back to sitting  on the side of the bed? : Unable Difficulty sitting down on and standing up from a chair with arms (e.g., wheelchair, bedside commode, etc,.)?: Unable Help needed moving to and from a bed to chair (including a wheelchair)?: Total Help needed walking in hospital room?: Total Help needed climbing 3-5 steps with a railing? : Total 6 Click Score: 6    End of Session   Activity Tolerance: Patient limited by lethargy Patient left: in bed;with bed alarm set;with call bell/phone within reach Nurse Communication: Mobility status PT Visit Diagnosis: Unsteadiness on feet (R26.81);Other abnormalities of gait and mobility (R26.89);Muscle weakness (generalized) (M62.81);History of falling (Z91.81)     Time: [id number] PT Time Calculation (min) (ACUTE ONLY): 18 min  Charges:  $Therapeutic Activity: 8-22 mins                     Ebony LocksHannah Kristee Angus, [place]A Pager [phone] Acute Rehab  Sheral ApleyHannah E Lovell Nuttall 09/29/2017, 2:12 PM

## 2017-09-29 NOTE — Progress Notes (Signed)
[facility] CSW received phone call from [place] [facility] had questions about pt's diet. CSW clarified information for [facility] per AVS.   Montine CircleKelsy Anecia Nusbaum, Silverio LayLCSWA [facility]  [phone]

## 2017-09-29 NOTE — Consult Note (Signed)
            [facility] Primary Care Navigator  09/29/2017  Ebony [name] 08-Mar-1931 [id number]    Went to seepatient at the bedside to identify possible discharge needsbutshe was resting in bed with eyes closed. No noted family at the bedside.  Per chart review, patient is a long term care resident at [facility].  Per MD note, patient had unwitnessed fall- found on the floor (traumatic intraventricular hemorrhage; post-traumatic subdural hematoma; global aphasia- resolved; Advanced Dementia) and was found to have hyponatremia which had led to this admission.  Therapy recommends for patient to discharge back to SNF- skilled nursing facility.  Anticipated discharge plan would be for patient to return back to [facility] SN[facility]- skilled nursing facility placement per Inpatient social worker.  Noted no furtherhealth management needs identified atthis point.                                                                            For additional questions please contact:  Karin GoldenLorraine A. Avyukth Bontempo, BSN, RN-BC [facility] PRIMARY CARE Navigator Cell: [phone]

## 2017-09-29 NOTE — Progress Notes (Signed)
   Subjective:   Today, Ebony [name] was examined at bedside and denies any complaints. She continues to improve neurologically as she is more alert and active.  Objective:  Vital signs in last 24 hours: Vitals:   09/28/17 2229 09/28/17 2230 09/28/17 2347 09/29/17 0401  BP: 140/75 140/75 122/64 (!) 143/67  Pulse:   69 69  Resp:   14 12  Temp:   99.1 F (37.3 C) 98.7 F (37.1 C)  TempSrc:   Oral Oral  SpO2:   95% 97%  Weight:      Height:       Physical exams Constitutional: in NAD, alert to name CV: Irregular rhythm Resp: CTABL anteriorlly Abd: BS +, Non distended Neuro: Alert to name, strength improving.  Assessment/Plan:  Principal Problem:   Hyponatremia Active Problems:   Hypokalemia   Hypertensive urgency   Fall   Paroxysmal A-fib [facility](HCC)   Hypothyroidism   Elevated troponin   QT prolongation   Intracerebral hemorrhage   Dementia without behavioral disturbance   Cerebral amyloid angiopathy (CODE)  Ebony [name] is an 84 year old Caucasian female with past medical history significant for hypertension, atrial fibrillation currently not on anticoagulation, previous CVA, previous subarachnoid hemorrhage(on 02/09/2017),hyperlipidemia, hypothyroidism, depression, left temporal lobe hematoma, dementia, frequent falls, intermittent aphasia and right[facility]sided weakness [facility] and found to have hypotonic hyponatremia on arrivalwhich has resolved. Recently found to have new small intraventricular hemorrhage without hydrocephalus and nondisplaced acute skull fractures-no surgical intervetion.Repeat EEG revealed no epileptiform activity. Neurology plans to HOLD off on AED due to patient's dementia and history of A-Fibb  Unwitnessed fall, unclear etiology  -No AED per neuro -echo completed pending official read -[facility] Pear[facility]ethi [facility] in about 6 weeks.  Global aphasia- resolved 2/2 IVH  vs SDH Advanced Dementia Improving daily as she is more alert and conversational -Hold ASA and VTE ppx -Diet advanced to dysphagia 3 -Displacement: SNF ([facility]) once stable   HTN -Maintain SBP<140 per neuro  -Continue home Lisinopril 40mg  qd, metoprolol 25mg  BID, Hydralazine 25mg  QID -Continue Labetalol 5mg  prn   Hypotonic Hyponatremia - resolved sNa 131  Hypothyroidism  -Continue PO synthroid 25mcg  Dispo: Anticipated discharge today.  Yvette RackAgyei, Valeriano Bain K, MD 09/29/2017, 7:09 AM Pager: [phone] IMTS PGY-1

## 2017-09-29 NOTE — Care Management Note (Signed)
Case Management Note  Patient Details  Name: Ebony [name] MRN: [id number] Date of Birth: March 20, 1931  Subjective/Objective:                    Action/Plan: [facility] is [facility]. CM signing off.   Expected Discharge Date:  09/29/17               Expected Discharge Plan:  Skilled Nursing Facility  In-House Referral:  Clinical Social Work  Discharge planning Services     Post Acute Care Choice:    Choice offered to:     DME Arranged:    DME Agency:     HH Arranged:    HH Agency:     Status of Service:  Completed, signed off  If discussed at [employer] Meetings, dates discussed:    Additional Comments:  Kermit BaloKelli F Kydan Shanholtzer, RN 09/29/2017, 3:26 PM

## 2017-09-29 NOTE — Progress Notes (Signed)
  Date: 09/29/2017  Patient name: Ebony [name]  Medical record number: [id number]  Date of birth: Apr 21, 1931   I have seen and evaluated this patient and I have discussed the plan of care with the house staff. Please see their note for complete details. I concur with their findings with the following additions/corrections: Ebony [name] was seen on AM rounds with team. ECHO nl except grade 1 diastolic dysfxn. Stable for D[facility] back [facility].  Burns SpainButcher, Elizabeth A, MD 09/29/2017, 4:11 PM

## 2017-09-30 DIAGNOSIS — G4700 Insomnia, unspecified: Secondary | ICD-10-CM | POA: Diagnosis not present

## 2017-09-30 DIAGNOSIS — I10 Essential (primary) hypertension: Secondary | ICD-10-CM | POA: Diagnosis not present

## 2017-09-30 DIAGNOSIS — S06309A Unspecified focal traumatic brain injury with loss of consciousness of unspecified duration, initial encounter: Secondary | ICD-10-CM | POA: Diagnosis not present

## 2017-09-30 DIAGNOSIS — E871 Hypo-osmolality and hyponatremia: Secondary | ICD-10-CM | POA: Diagnosis not present

## 2017-09-30 DIAGNOSIS — R69 Illness, unspecified: Secondary | ICD-10-CM | POA: Diagnosis not present

## 2017-09-30 DIAGNOSIS — F0150 Vascular dementia without behavioral disturbance: Secondary | ICD-10-CM | POA: Diagnosis not present

## 2017-09-30 DIAGNOSIS — F419 Anxiety disorder, unspecified: Secondary | ICD-10-CM | POA: Diagnosis not present

## 2017-10-04 DIAGNOSIS — R197 Diarrhea, unspecified: Secondary | ICD-10-CM | POA: Diagnosis not present

## 2017-10-04 DIAGNOSIS — R300 Dysuria: Secondary | ICD-10-CM | POA: Diagnosis not present

## 2017-10-04 DIAGNOSIS — Z7189 Other specified counseling: Secondary | ICD-10-CM | POA: Diagnosis not present

## 2017-10-04 DIAGNOSIS — I9589 Other hypotension: Secondary | ICD-10-CM | POA: Diagnosis not present

## 2017-10-04 DIAGNOSIS — R112 Nausea with vomiting, unspecified: Secondary | ICD-10-CM | POA: Diagnosis not present

## 2017-10-04 DIAGNOSIS — R509 Fever, unspecified: Secondary | ICD-10-CM | POA: Diagnosis not present

## 2017-10-05 DIAGNOSIS — R1110 Vomiting, unspecified: Secondary | ICD-10-CM | POA: Diagnosis not present

## 2017-10-05 DIAGNOSIS — D72825 Bandemia: Secondary | ICD-10-CM | POA: Diagnosis not present

## 2017-10-05 DIAGNOSIS — N3001 Acute cystitis with hematuria: Secondary | ICD-10-CM | POA: Diagnosis not present

## 2017-10-06 DIAGNOSIS — Z79899 Other long term (current) drug therapy: Secondary | ICD-10-CM | POA: Diagnosis not present

## 2017-10-07 DIAGNOSIS — N3000 Acute cystitis without hematuria: Secondary | ICD-10-CM | POA: Diagnosis not present

## 2017-10-07 DIAGNOSIS — R5383 Other fatigue: Secondary | ICD-10-CM | POA: Diagnosis not present

## 2017-10-07 DIAGNOSIS — D72829 Elevated white blood cell count, unspecified: Secondary | ICD-10-CM | POA: Diagnosis not present

## 2017-10-11 DIAGNOSIS — N3000 Acute cystitis without hematuria: Secondary | ICD-10-CM | POA: Diagnosis not present

## 2017-10-11 DIAGNOSIS — D72829 Elevated white blood cell count, unspecified: Secondary | ICD-10-CM | POA: Diagnosis not present

## 2017-10-12 DIAGNOSIS — N390 Urinary tract infection, site not specified: Secondary | ICD-10-CM | POA: Diagnosis not present

## 2017-11-04 DIAGNOSIS — Z79899 Other long term (current) drug therapy: Secondary | ICD-10-CM | POA: Diagnosis not present

## 2017-11-04 DIAGNOSIS — R197 Diarrhea, unspecified: Secondary | ICD-10-CM | POA: Diagnosis not present

## 2017-11-04 DIAGNOSIS — R1310 Dysphagia, unspecified: Secondary | ICD-10-CM | POA: Diagnosis not present

## 2017-11-04 DIAGNOSIS — R69 Illness, unspecified: Secondary | ICD-10-CM | POA: Diagnosis not present

## 2017-11-04 DIAGNOSIS — I10 Essential (primary) hypertension: Secondary | ICD-10-CM | POA: Diagnosis not present

## 2017-11-04 DIAGNOSIS — D649 Anemia, unspecified: Secondary | ICD-10-CM | POA: Diagnosis not present

## 2017-11-04 DIAGNOSIS — G4700 Insomnia, unspecified: Secondary | ICD-10-CM | POA: Diagnosis not present

## 2017-11-04 DIAGNOSIS — E871 Hypo-osmolality and hyponatremia: Secondary | ICD-10-CM | POA: Diagnosis not present

## 2017-11-04 DIAGNOSIS — F0150 Vascular dementia without behavioral disturbance: Secondary | ICD-10-CM | POA: Diagnosis not present

## 2017-11-04 DIAGNOSIS — F419 Anxiety disorder, unspecified: Secondary | ICD-10-CM | POA: Diagnosis not present

## 2017-11-05 DIAGNOSIS — D509 Iron deficiency anemia, unspecified: Secondary | ICD-10-CM | POA: Diagnosis not present

## 2017-11-05 DIAGNOSIS — R197 Diarrhea, unspecified: Secondary | ICD-10-CM | POA: Diagnosis not present

## 2017-11-05 DIAGNOSIS — E871 Hypo-osmolality and hyponatremia: Secondary | ICD-10-CM | POA: Diagnosis not present

## 2017-11-08 DIAGNOSIS — N909 Noninflammatory disorder of vulva and perineum, unspecified: Secondary | ICD-10-CM | POA: Diagnosis not present

## 2017-11-08 DIAGNOSIS — E871 Hypo-osmolality and hyponatremia: Secondary | ICD-10-CM | POA: Diagnosis not present

## 2017-11-08 DIAGNOSIS — R197 Diarrhea, unspecified: Secondary | ICD-10-CM | POA: Diagnosis not present

## 2017-11-09 DIAGNOSIS — I10 Essential (primary) hypertension: Secondary | ICD-10-CM | POA: Diagnosis not present

## 2017-11-09 DIAGNOSIS — R197 Diarrhea, unspecified: Secondary | ICD-10-CM | POA: Diagnosis not present

## 2017-11-09 DIAGNOSIS — I482 Chronic atrial fibrillation: Secondary | ICD-10-CM | POA: Diagnosis not present

## 2017-11-10 DIAGNOSIS — R69 Illness, unspecified: Secondary | ICD-10-CM | POA: Diagnosis not present

## 2017-11-10 DIAGNOSIS — R3981 Functional urinary incontinence: Secondary | ICD-10-CM | POA: Diagnosis not present

## 2017-11-10 DIAGNOSIS — R300 Dysuria: Secondary | ICD-10-CM | POA: Diagnosis not present

## 2017-11-10 DIAGNOSIS — D649 Anemia, unspecified: Secondary | ICD-10-CM | POA: Diagnosis not present

## 2017-11-10 DIAGNOSIS — Z79899 Other long term (current) drug therapy: Secondary | ICD-10-CM | POA: Diagnosis not present

## 2017-11-11 DIAGNOSIS — D509 Iron deficiency anemia, unspecified: Secondary | ICD-10-CM | POA: Diagnosis not present

## 2017-11-11 DIAGNOSIS — E871 Hypo-osmolality and hyponatremia: Secondary | ICD-10-CM | POA: Diagnosis not present

## 2017-11-11 DIAGNOSIS — R197 Diarrhea, unspecified: Secondary | ICD-10-CM | POA: Diagnosis not present

## 2017-11-11 DIAGNOSIS — R799 Abnormal finding of blood chemistry, unspecified: Secondary | ICD-10-CM | POA: Diagnosis not present

## 2017-11-11 DIAGNOSIS — E039 Hypothyroidism, unspecified: Secondary | ICD-10-CM | POA: Diagnosis not present

## 2017-11-17 DIAGNOSIS — D649 Anemia, unspecified: Secondary | ICD-10-CM | POA: Diagnosis not present

## 2017-11-17 DIAGNOSIS — Z79899 Other long term (current) drug therapy: Secondary | ICD-10-CM | POA: Diagnosis not present

## 2017-11-19 DIAGNOSIS — R3981 Functional urinary incontinence: Secondary | ICD-10-CM | POA: Diagnosis not present

## 2017-11-19 DIAGNOSIS — R300 Dysuria: Secondary | ICD-10-CM | POA: Diagnosis not present

## 2017-11-19 DIAGNOSIS — N390 Urinary tract infection, site not specified: Secondary | ICD-10-CM | POA: Diagnosis not present

## 2017-11-22 DIAGNOSIS — R300 Dysuria: Secondary | ICD-10-CM | POA: Diagnosis not present

## 2017-11-22 DIAGNOSIS — N390 Urinary tract infection, site not specified: Secondary | ICD-10-CM | POA: Diagnosis not present

## 2017-11-22 DIAGNOSIS — R3981 Functional urinary incontinence: Secondary | ICD-10-CM | POA: Diagnosis not present

## 2017-11-23 ENCOUNTER — Ambulatory Visit: Payer: Self-pay | Admitting: Neurology

## 2017-12-02 DIAGNOSIS — R69 Illness, unspecified: Secondary | ICD-10-CM | POA: Diagnosis not present

## 2017-12-02 DIAGNOSIS — F0150 Vascular dementia without behavioral disturbance: Secondary | ICD-10-CM | POA: Diagnosis not present

## 2017-12-02 DIAGNOSIS — F419 Anxiety disorder, unspecified: Secondary | ICD-10-CM | POA: Diagnosis not present

## 2017-12-08 DIAGNOSIS — R69 Illness, unspecified: Secondary | ICD-10-CM | POA: Diagnosis not present

## 2017-12-08 DIAGNOSIS — R05 Cough: Secondary | ICD-10-CM | POA: Diagnosis not present

## 2018-01-04 DIAGNOSIS — I480 Paroxysmal atrial fibrillation: Secondary | ICD-10-CM | POA: Diagnosis not present

## 2018-01-04 DIAGNOSIS — E039 Hypothyroidism, unspecified: Secondary | ICD-10-CM | POA: Diagnosis not present

## 2018-01-04 DIAGNOSIS — I10 Essential (primary) hypertension: Secondary | ICD-10-CM | POA: Diagnosis not present

## 2018-01-04 DIAGNOSIS — R69 Illness, unspecified: Secondary | ICD-10-CM | POA: Diagnosis not present

## 2018-01-11 DIAGNOSIS — G4700 Insomnia, unspecified: Secondary | ICD-10-CM | POA: Diagnosis not present

## 2018-01-11 DIAGNOSIS — R69 Illness, unspecified: Secondary | ICD-10-CM | POA: Diagnosis not present

## 2018-01-25 DIAGNOSIS — R69 Illness, unspecified: Secondary | ICD-10-CM | POA: Diagnosis not present

## 2018-01-25 DIAGNOSIS — M25562 Pain in left knee: Secondary | ICD-10-CM | POA: Diagnosis not present

## 2018-01-25 DIAGNOSIS — Z7409 Other reduced mobility: Secondary | ICD-10-CM | POA: Diagnosis not present

## 2018-01-25 DIAGNOSIS — R296 Repeated falls: Secondary | ICD-10-CM | POA: Diagnosis not present

## 2018-01-26 DIAGNOSIS — Z7409 Other reduced mobility: Secondary | ICD-10-CM | POA: Diagnosis not present

## 2018-01-26 DIAGNOSIS — R296 Repeated falls: Secondary | ICD-10-CM | POA: Diagnosis not present

## 2018-01-26 DIAGNOSIS — M25562 Pain in left knee: Secondary | ICD-10-CM | POA: Diagnosis not present

## 2018-02-10 DIAGNOSIS — D649 Anemia, unspecified: Secondary | ICD-10-CM | POA: Diagnosis not present

## 2018-02-10 DIAGNOSIS — I10 Essential (primary) hypertension: Secondary | ICD-10-CM | POA: Diagnosis not present

## 2018-02-10 DIAGNOSIS — Z79899 Other long term (current) drug therapy: Secondary | ICD-10-CM | POA: Diagnosis not present

## 2018-02-22 DIAGNOSIS — R69 Illness, unspecified: Secondary | ICD-10-CM | POA: Diagnosis not present

## 2018-02-22 DIAGNOSIS — G4700 Insomnia, unspecified: Secondary | ICD-10-CM | POA: Diagnosis not present

## 2018-02-22 DIAGNOSIS — F419 Anxiety disorder, unspecified: Secondary | ICD-10-CM | POA: Diagnosis not present

## 2018-02-22 DIAGNOSIS — F0150 Vascular dementia without behavioral disturbance: Secondary | ICD-10-CM | POA: Diagnosis not present

## 2019-03-13 IMAGING — CT CT MAXILLOFACIAL W/O CM
4 of 11 series · 14 of 47 positions shown, 16 images · non-contrast
Comparison: CT of the head and maxillofacial structures, and MRI of
the brain, performed 08/31/2016, and CTA of the neck performed
06/24/2016

CLINICAL DATA: Status post unwitnessed fall, with deformity at the
bridge of the nose and bruising about the left orbit and the chin.
Epistaxis. Concern for head or cervical spine injury. Initial
encounter.

EXAM:
CT HEAD WITHOUT CONTRAST
CT MAXILLOFACIAL WITHOUT CONTRAST
CT CERVICAL SPINE WITHOUT CONTRAST
TECHNIQUE: Multidetector CT imaging of the head, cervical spine, and
maxillofacial structures were performed using the standard protocol
without intravenous contrast. Multiplanar CT image reconstructions
of the cervical spine and maxillofacial structures were also
generated.

[Series 5: head bone · axial · 0.47mm/px · z∈[-94,+6]mm · 5 of 76 slices shown, 7 images]
[im 13/76  brain]
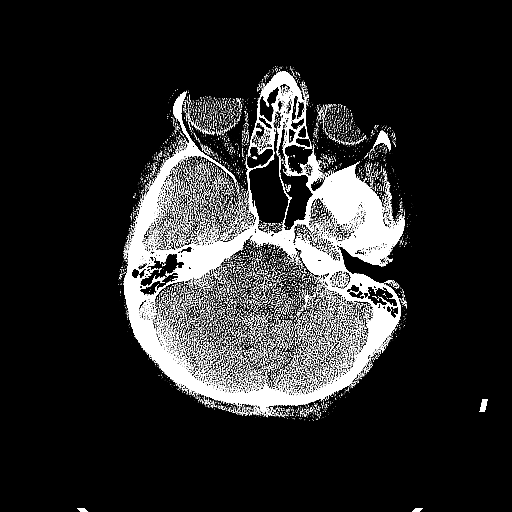
[im 13/76  bone]
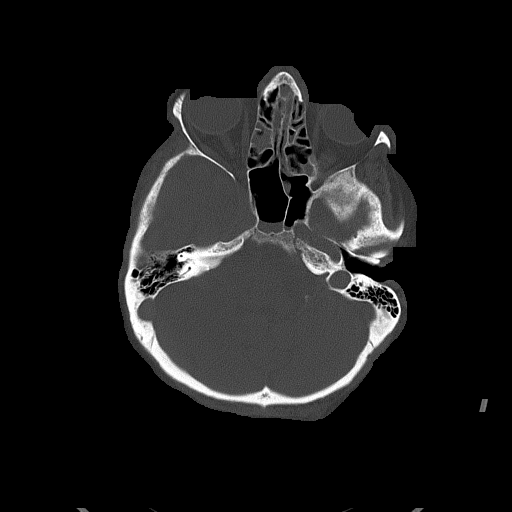
[im 26/76  bone]
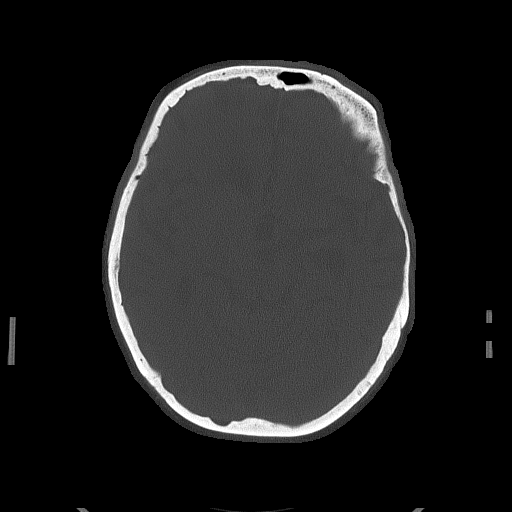
[im 38/76  bone]
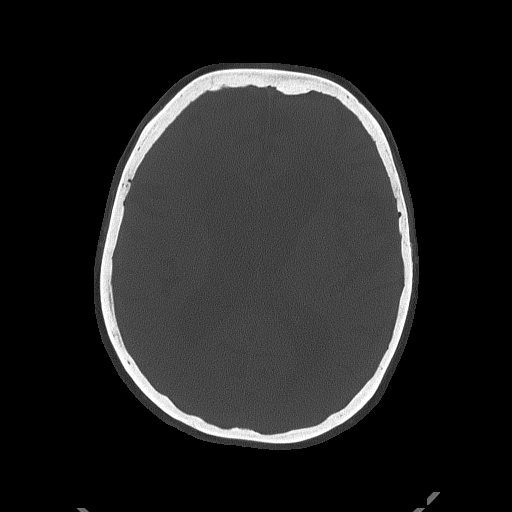
[im 51/76  bone]
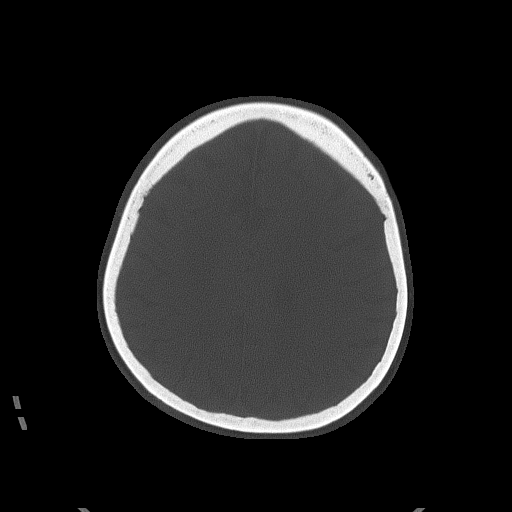
[im 63/76  brain]
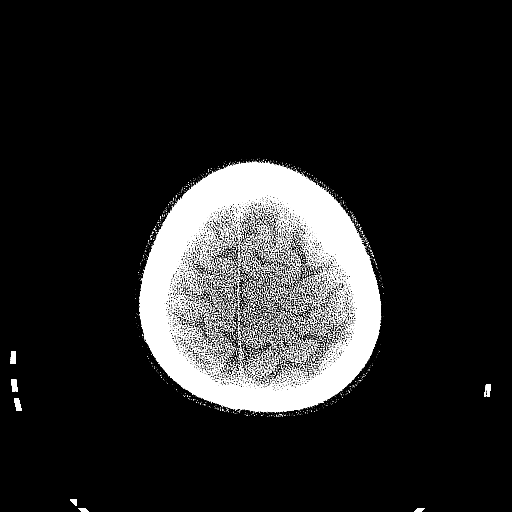
[im 63/76  bone]
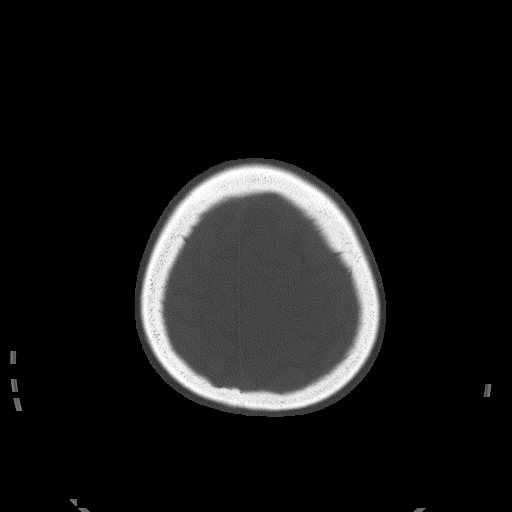

[Series 7: head without sag · sagittal · non-contrast · 0.31mm/px · 1 of 57 slices shown]
[im 29/57  bone]
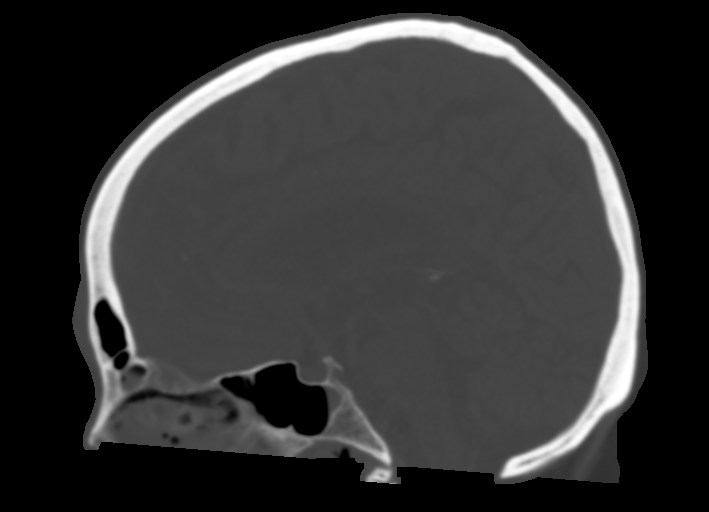

[Series 8: facialbone 2.0 st · axial · 0.36mm/px · z∈[-196,-96]mm · 5 of 76 slices shown]
[im 13/76  bone]
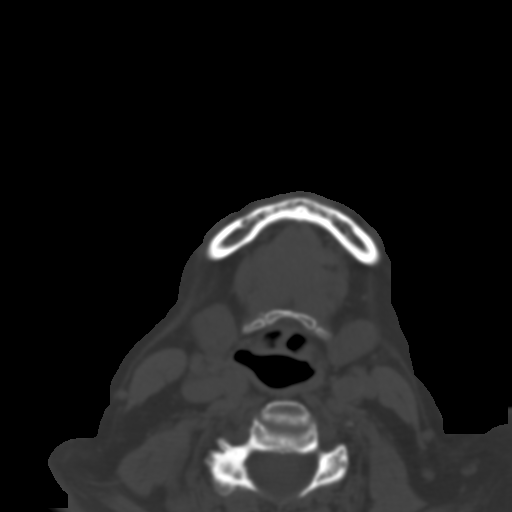
[im 26/76  bone]
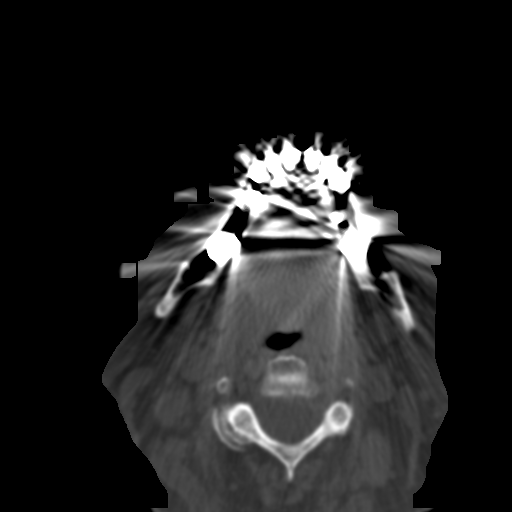
[im 38/76  bone]
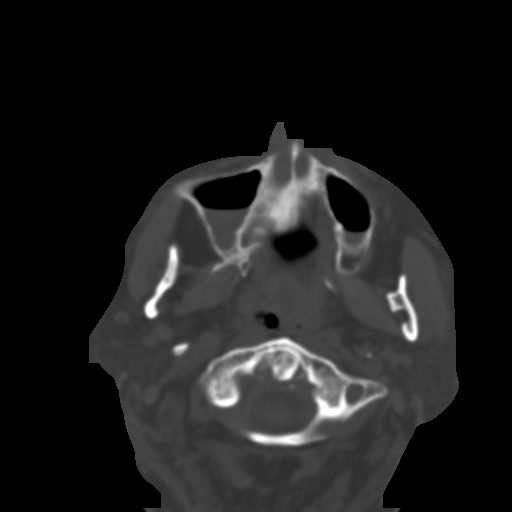
[im 51/76  bone]
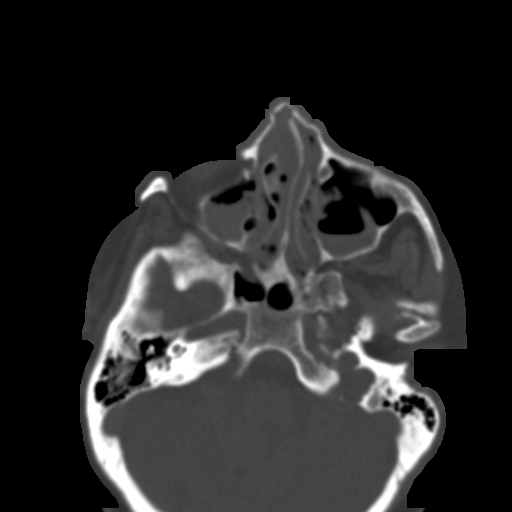
[im 63/76  bone]
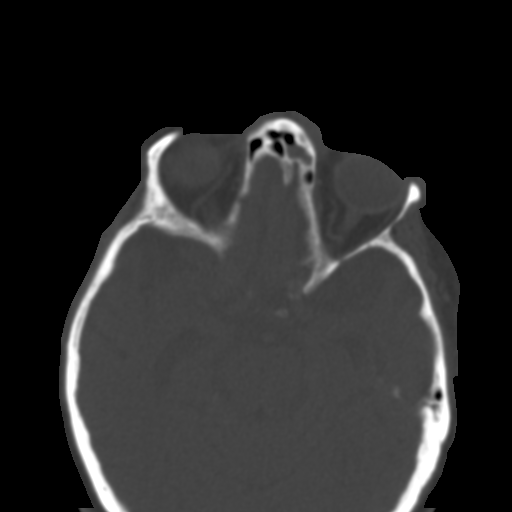

[Series 12: facialbone 2.0 cor st · coronal · 0.33mm/px · 3 of 76 slices shown]
[im 19/76  bone]
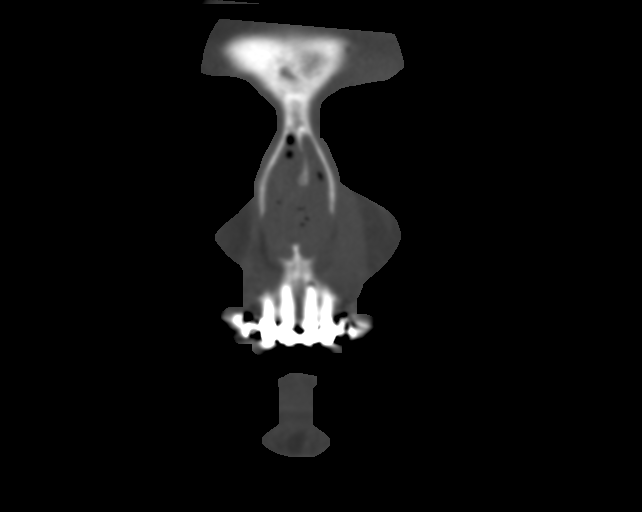
[im 38/76  bone]
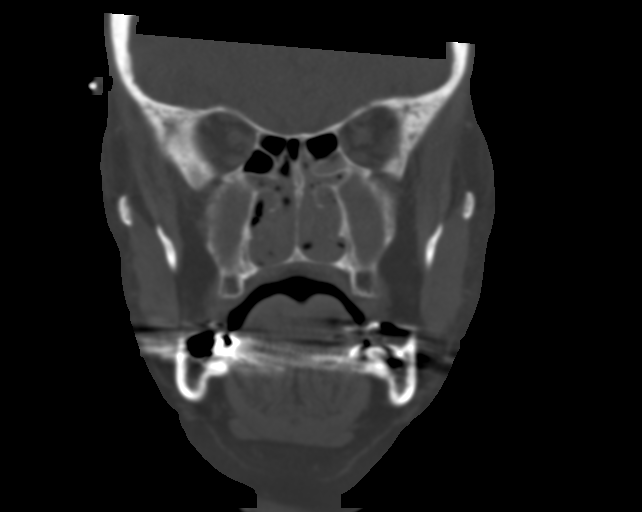
[im 57/76  bone]
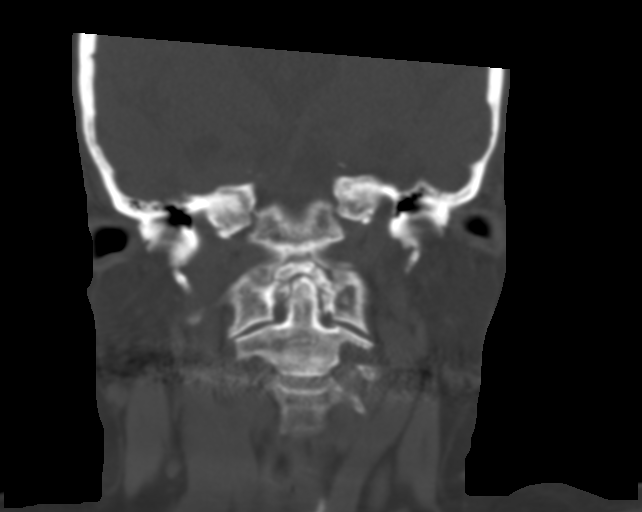

[14 of 47 positions shown; findings below may reference images not displayed]

FINDINGS: CT HEAD FINDINGS

Brain: No evidence of acute infarction, hemorrhage, hydrocephalus,
extra-axial collection or mass lesion/mass effect.

Prominence of the ventricles and sulci reflects moderate cortical
volume loss. Mild cerebellar atrophy is noted. Diffuse
periventricular and subcortical white matter change likely reflects
small vessel ischemic microangiopathy.

The brainstem and fourth ventricle are within normal limits. The
basal ganglia are unremarkable in appearance. The cerebral
hemispheres demonstrate grossly normal gray-white differentiation.
No mass effect or midline shift is seen.

Vascular: No hyperdense vessel or unexpected calcification.

Skull: There is no evidence of fracture; visualized osseous
structures are unremarkable in appearance.

Other: Soft tissue swelling is noted overlying the left frontal
calvarium and tracking about the bridge of the nose, with minimal
soft tissue air at the bridge of the nose.

CT MAXILLOFACIAL FINDINGS

Osseous: Blood is noted partially filling the maxillary sinuses
bilaterally, suspicious for fractures to the medial walls of both
maxillary sinuses. There is a mildly comminuted and depressed
fracture involving both sides of the nasal bone, with overlying soft
tissue swelling.

The mandible appears intact, aside from chronic flattening of the
mandibular condylar heads bilaterally. The visualized dentition
demonstrates no acute abnormality.

Orbits: The orbits are intact bilaterally.

Sinuses: As described above, there is partial opacification of the
maxillary sinuses with blood. There is minimal partial opacification
of the sphenoid sinus with low attenuation fluid. The remaining
visualized paranasal sinuses and mastoid air cells are well-aerated.

Soft tissues: Soft tissue swelling is noted overlying the frontal
calvarium and about the bridge of the nose. Mild soft tissue
swelling is noted about the left orbit.

The parapharyngeal fat planes are preserved. The nasopharynx,
oropharynx and hypopharynx are unremarkable in appearance. The
visualized portions of the valleculae and piriform sinuses are
grossly unremarkable. The parotid and submandibular glands are
within normal limits. No cervical lymphadenopathy is seen.

CT CERVICAL SPINE FINDINGS

Alignment: There is grade 1 anterolisthesis of C4 on C5, reflecting
underlying facet disease.

Skull base and vertebrae: No acute fracture. No primary bone lesion
or focal pathologic process.

Soft tissues and spinal canal: No prevertebral fluid or swelling. No
visible canal hematoma.

Disc levels: Multilevel disc space narrowing is noted along the
cervical spine, with scattered anterior and posterior disc
osteophyte complexes.

Upper chest: The thyroid gland is unremarkable in appearance. The
visualized lung apices are clear.

Other: No additional soft tissue abnormalities are seen.
IMPRESSION: 1. No evidence of traumatic intracranial injury.
2. Blood noted partially filling the maxillary sinuses bilaterally,
suspicious for fractures of the medial walls of both maxillary
sinuses.
3. Mildly comminuted and depressed fracture involving both sides of
the nasal bone, with overlying soft tissue swelling.
4. No evidence of acute fracture or subluxation along the cervical
spine.
5. Moderate cortical volume loss and diffuse small vessel ischemic
microangiopathy.
6. Soft tissue swelling overlying the left frontal calvarium and
about the left orbit.
7. Chronic flattening of the mandibular condylar heads bilaterally,
reflecting temporomandibular joint disease.
8. Mild degenerative change along the cervical spine.
